# Patient Record
Sex: Female | Born: 1935 | Race: Black or African American | Hispanic: No | Smoking: Former smoker
Health system: Southern US, Community
[De-identification: ages and names within clinical notes are randomized; demographics above are authoritative.]

## PROBLEM LIST (undated history)

## (undated) DIAGNOSIS — Z9289 Personal history of other medical treatment: Secondary | ICD-10-CM

## (undated) DIAGNOSIS — M1990 Unspecified osteoarthritis, unspecified site: Secondary | ICD-10-CM

## (undated) DIAGNOSIS — D649 Anemia, unspecified: Secondary | ICD-10-CM

## (undated) DIAGNOSIS — I10 Essential (primary) hypertension: Secondary | ICD-10-CM

## (undated) HISTORY — PX: CYSTECTOMY: SUR359

## (undated) HISTORY — PX: CATARACT EXTRACTION: SUR2

## (undated) HISTORY — PX: CHOLECYSTECTOMY: SHX55

## (undated) HISTORY — PX: TONSILLECTOMY: SUR1361

## (undated) HISTORY — PX: APPENDECTOMY: SHX54

---

## 2001-11-27 ENCOUNTER — Emergency Department (HOSPITAL_COMMUNITY): Admission: EM | Admit: 2001-11-27 | Discharge: 2001-11-27 | Payer: Self-pay | Admitting: Emergency Medicine

## 2001-11-27 ENCOUNTER — Ambulatory Visit (HOSPITAL_COMMUNITY): Admission: RE | Admit: 2001-11-27 | Discharge: 2001-11-27 | Payer: Self-pay | Admitting: Pulmonary Disease

## 2014-08-05 ENCOUNTER — Inpatient Hospital Stay (HOSPITAL_COMMUNITY)
Admission: EM | Admit: 2014-08-05 | Discharge: 2014-08-15 | DRG: 480 | Disposition: A | Discharge status: Skilled Nursing Facility | Payer: Medicare HMO | Attending: Internal Medicine | Admitting: Internal Medicine

## 2014-08-05 ENCOUNTER — Emergency Department (HOSPITAL_COMMUNITY): Payer: Medicare HMO

## 2014-08-05 ENCOUNTER — Encounter (HOSPITAL_COMMUNITY): Payer: Self-pay | Admitting: Emergency Medicine

## 2014-08-05 ENCOUNTER — Inpatient Hospital Stay (HOSPITAL_COMMUNITY): Payer: Medicare HMO

## 2014-08-05 DIAGNOSIS — IMO0002 Reserved for concepts with insufficient information to code with codable children: Secondary | ICD-10-CM | POA: Diagnosis present

## 2014-08-05 DIAGNOSIS — G9340 Encephalopathy, unspecified: Secondary | ICD-10-CM | POA: Diagnosis present

## 2014-08-05 DIAGNOSIS — M25562 Pain in left knee: Secondary | ICD-10-CM | POA: Diagnosis present

## 2014-08-05 DIAGNOSIS — J189 Pneumonia, unspecified organism: Secondary | ICD-10-CM | POA: Diagnosis present

## 2014-08-05 DIAGNOSIS — E43 Unspecified severe protein-calorie malnutrition: Secondary | ICD-10-CM | POA: Diagnosis present

## 2014-08-05 DIAGNOSIS — S72452S Displaced supracondylar fracture without intracondylar extension of lower end of left femur, sequela: Secondary | ICD-10-CM | POA: Diagnosis not present

## 2014-08-05 DIAGNOSIS — Z681 Body mass index (BMI) 19 or less, adult: Secondary | ICD-10-CM

## 2014-08-05 DIAGNOSIS — S82892K Other fracture of left lower leg, subsequent encounter for closed fracture with nonunion: Secondary | ICD-10-CM | POA: Diagnosis not present

## 2014-08-05 DIAGNOSIS — T148XXA Other injury of unspecified body region, initial encounter: Secondary | ICD-10-CM

## 2014-08-05 DIAGNOSIS — W010XXA Fall on same level from slipping, tripping and stumbling without subsequent striking against object, initial encounter: Secondary | ICD-10-CM | POA: Diagnosis present

## 2014-08-05 DIAGNOSIS — E860 Dehydration: Secondary | ICD-10-CM | POA: Diagnosis not present

## 2014-08-05 DIAGNOSIS — N390 Urinary tract infection, site not specified: Secondary | ICD-10-CM | POA: Diagnosis not present

## 2014-08-05 DIAGNOSIS — B9620 Unspecified Escherichia coli [E. coli] as the cause of diseases classified elsewhere: Secondary | ICD-10-CM | POA: Diagnosis not present

## 2014-08-05 DIAGNOSIS — N179 Acute kidney failure, unspecified: Secondary | ICD-10-CM | POA: Diagnosis not present

## 2014-08-05 DIAGNOSIS — W109XXA Fall (on) (from) unspecified stairs and steps, initial encounter: Secondary | ICD-10-CM | POA: Diagnosis not present

## 2014-08-05 DIAGNOSIS — S72302A Unspecified fracture of shaft of left femur, initial encounter for closed fracture: Secondary | ICD-10-CM | POA: Diagnosis present

## 2014-08-05 DIAGNOSIS — N189 Chronic kidney disease, unspecified: Secondary | ICD-10-CM | POA: Diagnosis not present

## 2014-08-05 DIAGNOSIS — M1990 Unspecified osteoarthritis, unspecified site: Secondary | ICD-10-CM | POA: Diagnosis present

## 2014-08-05 DIAGNOSIS — S72355D Nondisplaced comminuted fracture of shaft of left femur, subsequent encounter for closed fracture with routine healing: Secondary | ICD-10-CM | POA: Diagnosis present

## 2014-08-05 DIAGNOSIS — D509 Iron deficiency anemia, unspecified: Secondary | ICD-10-CM | POA: Diagnosis present

## 2014-08-05 DIAGNOSIS — N3000 Acute cystitis without hematuria: Secondary | ICD-10-CM | POA: Diagnosis not present

## 2014-08-05 DIAGNOSIS — S72402A Unspecified fracture of lower end of left femur, initial encounter for closed fracture: Principal | ICD-10-CM | POA: Diagnosis present

## 2014-08-05 DIAGNOSIS — E872 Acidosis, unspecified: Secondary | ICD-10-CM | POA: Diagnosis present

## 2014-08-05 DIAGNOSIS — J690 Pneumonitis due to inhalation of food and vomit: Secondary | ICD-10-CM | POA: Diagnosis present

## 2014-08-05 DIAGNOSIS — Z87891 Personal history of nicotine dependence: Secondary | ICD-10-CM | POA: Diagnosis not present

## 2014-08-05 DIAGNOSIS — D500 Iron deficiency anemia secondary to blood loss (chronic): Secondary | ICD-10-CM | POA: Diagnosis present

## 2014-08-05 DIAGNOSIS — Y929 Unspecified place or not applicable: Secondary | ICD-10-CM

## 2014-08-05 DIAGNOSIS — S72302S Unspecified fracture of shaft of left femur, sequela: Secondary | ICD-10-CM | POA: Diagnosis not present

## 2014-08-05 DIAGNOSIS — A419 Sepsis, unspecified organism: Secondary | ICD-10-CM | POA: Diagnosis not present

## 2014-08-05 DIAGNOSIS — S82892A Other fracture of left lower leg, initial encounter for closed fracture: Secondary | ICD-10-CM | POA: Diagnosis not present

## 2014-08-05 DIAGNOSIS — Z9049 Acquired absence of other specified parts of digestive tract: Secondary | ICD-10-CM | POA: Diagnosis not present

## 2014-08-05 DIAGNOSIS — Z9842 Cataract extraction status, left eye: Secondary | ICD-10-CM

## 2014-08-05 DIAGNOSIS — Z515 Encounter for palliative care: Secondary | ICD-10-CM

## 2014-08-05 DIAGNOSIS — I129 Hypertensive chronic kidney disease with stage 1 through stage 4 chronic kidney disease, or unspecified chronic kidney disease: Secondary | ICD-10-CM | POA: Diagnosis present

## 2014-08-05 DIAGNOSIS — R630 Anorexia: Secondary | ICD-10-CM | POA: Diagnosis present

## 2014-08-05 DIAGNOSIS — Z0000 Encounter for general adult medical examination without abnormal findings: Secondary | ICD-10-CM

## 2014-08-05 DIAGNOSIS — R509 Fever, unspecified: Secondary | ICD-10-CM

## 2014-08-05 DIAGNOSIS — S7292XA Unspecified fracture of left femur, initial encounter for closed fracture: Secondary | ICD-10-CM

## 2014-08-05 DIAGNOSIS — W19XXXA Unspecified fall, initial encounter: Secondary | ICD-10-CM

## 2014-08-05 DIAGNOSIS — D649 Anemia, unspecified: Secondary | ICD-10-CM

## 2014-08-05 DIAGNOSIS — S72302D Unspecified fracture of shaft of left femur, subsequent encounter for closed fracture with routine healing: Secondary | ICD-10-CM | POA: Diagnosis not present

## 2014-08-05 HISTORY — DX: Essential (primary) hypertension: I10

## 2014-08-05 HISTORY — DX: Anemia, unspecified: D64.9

## 2014-08-05 HISTORY — DX: Unspecified osteoarthritis, unspecified site: M19.90

## 2014-08-05 HISTORY — DX: Personal history of other medical treatment: Z92.89

## 2014-08-05 LAB — PREPARE RBC (CROSSMATCH)

## 2014-08-05 LAB — COMPREHENSIVE METABOLIC PANEL
ALBUMIN: 3.6 g/dL (ref 3.5–5.2)
ALT: 7 U/L (ref 0–35)
AST: 15 U/L (ref 0–37)
Alkaline Phosphatase: 77 U/L (ref 39–117)
Anion gap: 15 (ref 5–15)
BUN: 25 mg/dL — ABNORMAL HIGH (ref 6–23)
CHLORIDE: 107 meq/L (ref 96–112)
CO2: 21 mEq/L (ref 19–32)
CREATININE: 1.66 mg/dL — AB (ref 0.50–1.10)
Calcium: 8.9 mg/dL (ref 8.4–10.5)
GFR calc Af Amer: 31 mL/min — ABNORMAL LOW (ref >90)
GFR calc non Af Amer: 26 mL/min — ABNORMAL LOW (ref >90)
Glucose, Bld: 131 mg/dL — ABNORMAL HIGH (ref 70–99)
POTASSIUM: 4.4 meq/L (ref 3.7–5.3)
Sodium: 143 mEq/L (ref 137–147)
Total Bilirubin: 0.2 mg/dL — ABNORMAL LOW (ref 0.3–1.2)
Total Protein: 6.9 g/dL (ref 6.0–8.3)

## 2014-08-05 LAB — CBC WITH DIFFERENTIAL/PLATELET
BASOS ABS: 0 10*3/uL (ref 0.0–0.1)
BASOS PCT: 0 % (ref 0–1)
Eosinophils Absolute: 0.1 10*3/uL (ref 0.0–0.7)
Eosinophils Relative: 1 % (ref 0–5)
HCT: 17 % — ABNORMAL LOW (ref 36.0–46.0)
Hemoglobin: 5.1 g/dL — CL (ref 12.0–15.0)
Lymphocytes Relative: 11 % — ABNORMAL LOW (ref 12–46)
Lymphs Abs: 0.9 10*3/uL (ref 0.7–4.0)
MCH: 19.8 pg — ABNORMAL LOW (ref 26.0–34.0)
MCHC: 30 g/dL (ref 30.0–36.0)
MCV: 66.1 fL — ABNORMAL LOW (ref 78.0–100.0)
MONO ABS: 0.4 10*3/uL (ref 0.1–1.0)
Monocytes Relative: 4 % (ref 3–12)
NEUTROS ABS: 7.1 10*3/uL (ref 1.7–7.7)
NEUTROS PCT: 84 % — AB (ref 43–77)
Platelets: 486 10*3/uL — ABNORMAL HIGH (ref 150–400)
RBC: 2.57 MIL/uL — ABNORMAL LOW (ref 3.87–5.11)
RDW: 16.9 % — AB (ref 11.5–15.5)
WBC: 8.5 10*3/uL (ref 4.0–10.5)

## 2014-08-05 LAB — IRON AND TIBC
IRON: 15 ug/dL — AB (ref 42–135)
SATURATION RATIOS: 4 % — AB (ref 20–55)
TIBC: 400 ug/dL (ref 250–470)
UIBC: 385 ug/dL (ref 125–400)

## 2014-08-05 LAB — ABO/RH
ABO/RH(D): O POS
ABO/RH(D): O POS

## 2014-08-05 LAB — FOLATE: Folate: 8.8 ng/mL

## 2014-08-05 LAB — RETICULOCYTES
RBC.: 2.56 MIL/uL — ABNORMAL LOW (ref 3.87–5.11)
RETIC CT PCT: 1.7 % (ref 0.4–3.1)
Retic Count, Absolute: 43.5 10*3/uL (ref 19.0–186.0)

## 2014-08-05 LAB — VITAMIN B12: VITAMIN B 12: 285 pg/mL (ref 211–911)

## 2014-08-05 LAB — FERRITIN: Ferritin: 14 ng/mL (ref 10–291)

## 2014-08-05 MED ORDER — HYDROMORPHONE HCL 1 MG/ML IJ SOLN
0.5000 mg | Freq: Once | INTRAMUSCULAR | Status: AC
  Administered 2014-08-05: 0.5 mg via INTRAVENOUS
  Filled 2014-08-05: qty 1

## 2014-08-05 MED ORDER — SODIUM CHLORIDE 0.9 % IJ SOLN
3.0000 mL | Freq: Two times a day (BID) | INTRAMUSCULAR | Status: DC
  Administered 2014-08-05 – 2014-08-15 (×9): 3 mL via INTRAVENOUS

## 2014-08-05 MED ORDER — MORPHINE SULFATE 2 MG/ML IJ SOLN
1.0000 mg | INTRAMUSCULAR | Status: DC | PRN
  Administered 2014-08-06 – 2014-08-14 (×5): 1 mg via INTRAVENOUS
  Filled 2014-08-05 (×6): qty 1

## 2014-08-05 MED ORDER — TETANUS-DIPHTH-ACELL PERTUSSIS 5-2.5-18.5 LF-MCG/0.5 IM SUSP
INTRAMUSCULAR | Status: AC
Start: 2014-08-05 — End: 2014-08-05
  Filled 2014-08-05: qty 0.5

## 2014-08-05 MED ORDER — OXYCODONE HCL 5 MG PO TABS
5.0000 mg | ORAL_TABLET | ORAL | Status: DC | PRN
  Administered 2014-08-05 – 2014-08-06 (×3): 5 mg via ORAL
  Filled 2014-08-05 (×3): qty 1

## 2014-08-05 MED ORDER — SODIUM CHLORIDE 0.9 % IV SOLN
Freq: Once | INTRAVENOUS | Status: AC
  Administered 2014-08-05: 10:00:00 via INTRAVENOUS

## 2014-08-05 MED ORDER — SODIUM CHLORIDE 0.9 % IV SOLN: Freq: Once | INTRAVENOUS | Status: DC

## 2014-08-05 MED ORDER — ACETAMINOPHEN 650 MG RE SUPP: 650.0000 mg | Freq: Four times a day (QID) | RECTAL | Status: DC | PRN

## 2014-08-05 MED ORDER — ONDANSETRON HCL 4 MG/2ML IJ SOLN
4.0000 mg | Freq: Once | INTRAMUSCULAR | Status: AC
  Administered 2014-08-05: 4 mg via INTRAVENOUS
  Filled 2014-08-05: qty 2

## 2014-08-05 MED ORDER — ACETAMINOPHEN 325 MG PO TABS
650.0000 mg | ORAL_TABLET | Freq: Four times a day (QID) | ORAL | Status: DC | PRN
  Administered 2014-08-06 – 2014-08-15 (×11): 650 mg via ORAL
  Filled 2014-08-05 (×11): qty 2

## 2014-08-05 MED ORDER — TETANUS-DIPHTH-ACELL PERTUSSIS 5-2.5-18.5 LF-MCG/0.5 IM SUSP
0.5000 mL | Freq: Once | INTRAMUSCULAR | Status: AC
  Administered 2014-08-05: 0.5 mL via INTRAMUSCULAR

## 2014-08-05 MED ORDER — KCL IN DEXTROSE-NACL 20-5-0.45 MEQ/L-%-% IV SOLN
INTRAVENOUS | Status: DC
  Administered 2014-08-06: 03:00:00 via INTRAVENOUS
  Filled 2014-08-05 (×3): qty 1000

## 2014-08-05 NOTE — Progress Notes (Signed)
Nursing called orthopedic surgeon Dr. Fredonia Highland. Dr. To see pt in 7mins. Pt is npo family member at bedside.

## 2014-08-05 NOTE — H&P (Signed)
[facility] History and Physical  Alicia Guerra [id number] DOB: 08-26-1924 DOA: 08/05/2014  Referring physician: Dr. Roderic Palau PCP: No PCP Per Patient   Chief Complaint: Left knee fracture and low hemoglobin  HPI: Alicia Guerra is a 80 y.o. female  The patient is an 80 year old who currently lives by herself and presented to the ED after a fall. She states she slid down the steps and hit her knee. Subsequently developed pain at her left knee. Had pain with ambulation as such decided to come to the ED for further evaluation. While at the ED patient had x-ray of her left knee which reported comminuted fracture of the distal femoral diaphysis with angulation and displacement at the fracture site. Incidentally she was also found to have a low hemoglobin of 5.1. The patient otherwise has known complaints other than her left knee discomfort.  We were consulted for medical admission and request was made by orthopedic surgeon to transition to [facility] where orthopedic surgery would evaluate him there.   Review of Systems:  Constitutional:  No weight loss, night sweats, Fevers, chills, fatigue.  HEENT:  No headaches, Difficulty swallowing,Tooth/dental problems,Sore throat,  No sneezing, itching, ear ache, nasal congestion, post nasal drip,  Cardio-vascular:  No chest pain, Orthopnea, PND, swelling in lower extremities, anasarca, dizziness, palpitations  GI:  No heartburn, indigestion, abdominal pain, nausea, vomiting, diarrhea, change in bowel habits, loss of appetite  Resp:  No shortness of breath with exertion or at rest. No excess mucus, no productive cough, No non-productive cough, No coughing up of blood.No change in color of mucus.No wheezing.No chest wall deformity  Skin:  no rash or lesions.  GU:  no dysuria, change in color of urine, no urgency or frequency. No flank pain.  Musculoskeletal:  + joint pain or swelling. + decreased range of motion. No back  pain.  Psych:  No change in mood or affect. No depression or anxiety. No memory loss.   Past Medical History  Diagnosis Date  . Hypertension   . Arthritis    Past Surgical History  Procedure Laterality Date  . Back surgery     Social History:  reports that she has never smoked. She has never used smokeless tobacco. She reports that she does not drink alcohol or use illicit drugs.  No Known Allergies  History reviewed. No pertinent family history.   Prior to Admission medications   Not on File   Physical Exam: Filed Vitals:   08/05/14 0930 08/05/14 1000 08/05/14 1030 08/05/14 1100  BP: 180/105 161/54 160/55 162/56  Pulse: 87 78 81   Temp:      TempSrc:      Resp:  17 20 18   Height:      Weight:      SpO2: 98% 95% 100%     Wt Readings from Last 3 Encounters:  08/05/14 54.432 kg (120 lb)    General:  Appears calm and comfortable, Disheveled with poor hygeine Eyes: PERRL, normal lids, irises & conjunctiva ENT: grossly normal hearing, lips & tongue Neck: no LAD, masses or thyromegaly Cardiovascular: RRR, no m/r/g. No LE edema. Respiratory: CTA bilaterally, no w/r/r. Normal respiratory effort. Abdomen: soft, nt, nd Skin: no rash or induration seen on limited exam Musculoskeletal: swollen left knee currently immobilized and painful with movement or palpation Psychiatric: grossly normal mood and affect, speech fluent and appropriate Neurologic: grossly non-focal.          Labs on Admission:  Basic Metabolic Panel:  Recent Labs Lab 08/05/14 0940  NA 143  K 4.4  CL 107  CO2 21  GLUCOSE 131*  BUN 25*  CREATININE 1.66*  CALCIUM 8.9   Liver Function Tests:  Recent Labs Lab 08/05/14 0940  AST 15  ALT 7  ALKPHOS 77  BILITOT 0.2*  PROT 6.9  ALBUMIN 3.6   No results for input(s): LIPASE, AMYLASE in the last 168 hours. No results for input(s): AMMONIA in the last 168 hours. CBC:  Recent Labs Lab 08/05/14 0940  WBC 8.5  NEUTROABS 7.1  HGB 5.1*    HCT 17.0*  MCV 66.1*  PLT 486*   Cardiac Enzymes: No results for input(s): CKTOTAL, CKMB, CKMBINDEX, TROPONINI in the last 168 hours.  BNP (last 3 results) No results for input(s): PROBNP in the last 8760 hours. CBG: No results for input(s): GLUCAP in the last 168 hours.  Radiological Exams on Admission: Dg Knee 1-2 Views Left  08/05/2014   CLINICAL DATA:  Patient fell down steps  EXAM: LEFT KNEE - 1-2 VIEW  COMPARISON:  None.  FINDINGS: Frontal and lateral views were obtained. There is a comminuted fracture of the distal femoral metaphysis with posterior angulation and displacement of the distal major fracture fragment with respect proximal fragment. No other fractures. No frank dislocation. There is mild narrowing of the patellofemoral joint. There is extensive arterial vascular calcification.  IMPRESSION: Comminuted fracture distal femoral metaphysis with angulation and displacement at the fracture site. No dislocation.   Electronically Signed   By: Lowella Grip M.D.   On: 08/05/2014 08:54   Dg Pelvis Portable  08/05/2014   CLINICAL DATA:  Distal right femur fracture secondary to a fall down steps today.  EXAM: PORTABLE PELVIS 1-2 VIEWS  COMPARISON:  None.  FINDINGS: There is no evidence of pelvic fracture or diastasis. No pelvic bone lesions are seen. Calcified fibroid in the uterus. Bowel gas pattern is normal.  IMPRESSION: Normal pelvic bones.  No acute abnormalities.   Electronically Signed   By: Rozetta Nunnery M.D.   On: 08/05/2014 09:57   Dg Chest Portable 1 View  08/05/2014   CLINICAL DATA:  Pre operative respiratory exam. Distal left femur fracture.  EXAM: PORTABLE CHEST - 1 VIEW  COMPARISON:  None.  FINDINGS: Heart size is within normal limits considering the AP portable technique. There is slight pulmonary vascular prominence. Slight thickening of the minor fissure on the right. Minimal atelectasis or scarring at the left lung base. Old healed left eighth rib fracture posterior  laterally.  The L1 vertebra is slightly sclerotic. This may be due to a prior fracture.  IMPRESSION: Slight pulmonary vascular prominence. Minimal scarring or atelectasis at the left base.   Electronically Signed   By: Rozetta Nunnery M.D.   On: 08/05/2014 10:04    EKG: Independently reviewed. Much artifact but on my evaluation looked sinus with no ST elevations or depressions  Assessment/Plan Active Problems:   Knee fracture -  Supporting therapy - Orthopedic surgery on board and will evaluate and treat. Orthopedic surgeon here at this hospital would like patient transition to [facility] where orthopedic surgeons at that hospital may provide continued care for the patient - Nothing by mouth    Blood loss anemia -Most likely GI source given positive Hemoccult. - Agree with 2 units of blood transfusion and will continue ER order for such    Dehydration - Place on maintenance IV fluids and reassess  Code Status: full DVT Prophylaxis: SCD on Right  leg (unaffected leg) Family Communication: Discussed with patient and family at bedside Disposition Plan: Transition to [name] on telemetry  Time spent: More than 45 minutes  Velvet Bathe [facility] Pager [phone]

## 2014-08-05 NOTE — ED Notes (Signed)
Per EMS pt was found at the bottom of her steps this am. Obvious deformity to L knee. Pt was out of doors for over an hour. L leg immobilized by EMS. Pt hypertensive on arrival. Pt reports she stepped on some leaves and fell down the steps.Pt denies hitting her head or LOC. Pt states "I stepped on some leaves and slid down the steps on my butt."

## 2014-08-05 NOTE — ED Notes (Signed)
Pt's hands cold. Pulse Ox not registering well.

## 2014-08-05 NOTE — ED Notes (Signed)
Report given to receiving RN, pt transferred via [place]. Unit 1 of 2 transfusing.

## 2014-08-05 NOTE — ED Provider Notes (Signed)
CSN: [id number]     Arrival date & time 08/05/14  0813 History  This chart was scribed for Maudry Diego, MD by Rayfield Citizen, ED Scribe. This patient was seen in room APA04/APA04 and the patient's care was started at 9:02 AM.    Chief Complaint  Patient presents with  . Fall   Patient is a 80 y.o. female presenting with fall. The history is provided by the patient (pt complains of fall). No language interpreter was used.  Fall This is a new problem. The current episode started 1 to 2 hours ago. The problem has not changed since onset.Pertinent negatives include no chest pain, no abdominal pain and no headaches. Nothing aggravates the symptoms. Nothing relieves the symptoms. She has tried nothing for the symptoms.     HPI Comments: Alicia Guerra is a 80 y.o. female who presents to the Emergency Department complaining of a fall. Patient "stepped on some leaves" and "slid down 4 stairs" on her bottom this morning. She denies head injury or LOC. Per EMS, patient was found at the bottom of the steps - according to patient, she was outside for over an hour when she was found. She reports left knee pain at present.   PCP was Dr. Luan Pulling; has been over a year since last seen. Patient lives alone at home.   Past Medical History  Diagnosis Date  . Hypertension   . Arthritis    Past Surgical History  Procedure Laterality Date  . Back surgery     History reviewed. No pertinent family history. History  Substance Use Topics  . Smoking status: Never Smoker   . Smokeless tobacco: Never Used  . Alcohol Use: No   OB History    Gravida Para Term Preterm AB TAB SAB Ectopic Multiple Living            0     Review of Systems  Constitutional: Negative for appetite change and fatigue.  HENT: Negative for congestion, ear discharge and sinus pressure.   Eyes: Negative for discharge.  Respiratory: Negative for cough.   Cardiovascular: Negative for chest pain.  Gastrointestinal: Negative for  abdominal pain and diarrhea.  Genitourinary: Negative for frequency and hematuria.  Musculoskeletal: Positive for arthralgias (left knee). Negative for back pain.  Skin: Negative for rash.  Neurological: Negative for seizures and headaches.  Psychiatric/Behavioral: Negative for hallucinations.    Allergies  Review of patient's allergies indicates no known allergies.  Home Medications   Prior to Admission medications   Not on File   BP 198/64 mmHg  Pulse 32  Temp(Src) 97.8 F (36.6 C) (Oral)  Resp 18  Ht 5' (1.524 m)  Wt 120 lb (54.432 kg)  BMI 23.44 kg/m2  SpO2 84% Physical Exam  Constitutional: She is oriented to person, place, and time. She appears well-developed.  HENT:  Head: Normocephalic.  Dry mucous membranes, pale conjunctiva  Eyes: Conjunctivae and EOM are normal. No scleral icterus.  Neck: Neck supple. No thyromegaly present.  Cardiovascular: Normal rate and regular rhythm.  Exam reveals no gallop and no friction rub.   No murmur heard. Pulmonary/Chest: No stridor. She has no wheezes. She has no rales. She exhibits no tenderness.  Abdominal: She exhibits no distension. There is no tenderness. There is no rebound.  Genitourinary:  Rectal: normal exam, brown stool, heme positive  Musculoskeletal: Normal range of motion. She exhibits no edema.  Deformed painful left knee  Lymphadenopathy:    She has no cervical adenopathy.  Neurological: She is oriented to person, place, and time. She exhibits normal muscle tone. Coordination normal.  Skin: No rash noted. No erythema.  Psychiatric: She has a normal mood and affect. Her behavior is normal.    ED Course  Procedures   DIAGNOSTIC STUDIES: Oxygen Saturation is 97% on RA, normal by my interpretation.    COORDINATION OF CARE: 9:10 AM Discussed treatment plan with pt at bedside and pt agreed to plan   Labs Review Labs Reviewed - No data to display  Imaging Review Dg Knee 1-2 Views Left  08/05/2014    CLINICAL DATA:  Patient fell down steps  EXAM: LEFT KNEE - 1-2 VIEW  COMPARISON:  None.  FINDINGS: Frontal and lateral views were obtained. There is a comminuted fracture of the distal femoral metaphysis with posterior angulation and displacement of the distal major fracture fragment with respect proximal fragment. No other fractures. No frank dislocation. There is mild narrowing of the patellofemoral joint. There is extensive arterial vascular calcification.  IMPRESSION: Comminuted fracture distal femoral metaphysis with angulation and displacement at the fracture site. No dislocation.   Electronically Signed   By: Lowella Grip M.D.   On: 08/05/2014 08:54     EKG Interpretation   Date/Time:  Tuesday August 05 2014 09:55:11 EST Ventricular Rate:  89 PR Interval:    QRS Duration: 79 QT Interval:  365 QTC Calculation: 444 R Axis:   42 Text Interpretation:  Atrial fibrillation Ventricular premature complex  Probable LVH with secondary repol abnrm Confirmed by Aylen Rambert  MD, Fines Kimberlin  [phone]) on 08/05/2014 10:47:11 AM     CRITICAL CARE Performed by: Aiko Belko L Total critical care time: 40 Critical care time was exclusive of separately billable procedures and treating other patients. Critical care was necessary to treat or prevent imminent or life-threatening deterioration. Critical care was time spent personally by me on the following activities: development of treatment plan with patient and/or surrogate as well as nursing, discussions with consultants, evaluation of patient's response to treatment, examination of patient, obtaining history from patient or surrogate, ordering and performing treatments and interventions, ordering and review of laboratory studies, ordering and review of radiographic studies, pulse oximetry and re-evaluation of patient's condition.   MDM   Final diagnoses:  Fall   [facility] and [facility].   I spoke with dr. Aline Brochure and  he will contact ortho in [place], MD 08/05/14 1145

## 2014-08-05 NOTE — ED Notes (Signed)
Heme positive result per Dr. Roderic Palau.

## 2014-08-05 NOTE — ED Notes (Signed)
Critical value Hgb 5.1 reported to Dr. Roderic Palau.

## 2014-08-05 NOTE — Consult Note (Signed)
ORTHOPAEDIC CONSULTATION  REQUESTING PHYSICIAN: Velvet Bathe, MD  Chief Complaint: Left femur fracture  HPI: Alicia Guerra is a 80 y.o. female who complains of a mechanical fall on wet leaves. No other c/o  Past Medical History  Diagnosis Date  . Hypertension   . Arthritis   . Anemia   . History of blood transfusion     "today is the first time" (08/05/2014)   Past Surgical History  Procedure Laterality Date  . Cataract extraction Left   . Tonsillectomy    . Appendectomy    . Cholecystectomy    . Cystectomy      "had cyst taken off lower back"   History   Social History  . Marital Status: Widowed    Spouse Name: N/A    Number of Children: N/A  . Years of Education: N/A   Social History Main Topics  . Smoking status: Former Research scientist (life sciences)  . Smokeless tobacco: Never Used  . Alcohol Use: No  . Drug Use: No  . Sexual Activity: No   Other Topics Concern  . None   Social History Narrative  . None   History reviewed. No pertinent family history. No Known Allergies Prior to Admission medications   Not on File   Dg Knee 1-2 Views Left  08/05/2014   CLINICAL DATA:  Patient fell down steps  EXAM: LEFT KNEE - 1-2 VIEW  COMPARISON:  None.  FINDINGS: Frontal and lateral views were obtained. There is a comminuted fracture of the distal femoral metaphysis with posterior angulation and displacement of the distal major fracture fragment with respect proximal fragment. No other fractures. No frank dislocation. There is mild narrowing of the patellofemoral joint. There is extensive arterial vascular calcification.  IMPRESSION: Comminuted fracture distal femoral metaphysis with angulation and displacement at the fracture site. No dislocation.   Electronically Signed   By: Lowella Grip M.D.   On: 08/05/2014 08:54   Dg Pelvis Portable  08/05/2014   CLINICAL DATA:  Distal right femur fracture secondary to a fall down steps today.  EXAM: PORTABLE PELVIS 1-2 VIEWS  COMPARISON:   None.  FINDINGS: There is no evidence of pelvic fracture or diastasis. No pelvic bone lesions are seen. Calcified fibroid in the uterus. Bowel gas pattern is normal.  IMPRESSION: Normal pelvic bones.  No acute abnormalities.   Electronically Signed   By: Rozetta Nunnery M.D.   On: 08/05/2014 09:57   Dg Chest Portable 1 View  08/05/2014   CLINICAL DATA:  Pre operative respiratory exam. Distal left femur fracture.  EXAM: PORTABLE CHEST - 1 VIEW  COMPARISON:  None.  FINDINGS: Heart size is within normal limits considering the AP portable technique. There is slight pulmonary vascular prominence. Slight thickening of the minor fissure on the right. Minimal atelectasis or scarring at the left lung base. Old healed left eighth rib fracture posterior laterally.  The L1 vertebra is slightly sclerotic. This may be due to a prior fracture.  IMPRESSION: Slight pulmonary vascular prominence. Minimal scarring or atelectasis at the left base.   Electronically Signed   By: Rozetta Nunnery M.D.   On: 08/05/2014 10:04    Positive ROS: All other systems have been reviewed and were otherwise negative with the exception of those mentioned in the HPI and as above.  Labs cbc  Recent Labs  08/05/14 0940  WBC 8.5  HGB 5.1*  HCT 17.0*  PLT 486*    Labs inflam No results for input(s): CRP  in the last 72 hours.  Invalid input(s): ESR  Labs coag No results for input(s): INR, PTT in the last 72 hours.  Invalid input(s): PT   Recent Labs  08/05/14 0940  NA 143  K 4.4  CL 107  CO2 21  GLUCOSE 131*  BUN 25*  CREATININE 1.66*  CALCIUM 8.9    Physical Exam: Filed Vitals:   08/05/14 1355  BP: 182/61  Pulse: 93  Temp: 98.1 F (36.7 C)  Resp: 18   General: Alert, no acute distress Cardiovascular: No pedal edema Respiratory: No cyanosis, no use of accessory musculature GI: No organomegaly, abdomen is soft and non-tender Skin: No lesions in the area of chief complaint other than those listed below in MSK  exam.  Neurologic: Sensation intact distally Psychiatric: Patient is competent for consent with normal mood and affect Lymphatic: No axillary or cervical lymphadenopathy  MUSCULOSKELETAL:  LLE: skin intact Pain at the left leg SILT DP/SP/S/S/T nerve, 2+ DP, +TA/GS/EHL Compartments soft  Other extremities are atraumatic with painless ROM and NVI.  Assessment: Left Supracondylar/intracondylar femur fracture  Plan: ORIF on Thursday CT to eval condylar involvement Weight Bearing Status: NWB LLE PT VTE px: SCD's and hold chemical px on Thursday am   [name], D, MD Cell [phone]   08/05/2014 4:01 PM

## 2014-08-05 NOTE — ED Notes (Signed)
Report given to [facility], Therapist, sports. Pt ready for transport.

## 2014-08-05 NOTE — Progress Notes (Signed)
Pt bp elevated sbp 170's and pt is incontinent of urine. Paged Dr. Wendee Beavers to request a foley catheter and make him aware of bp. Orders for foley catheter given. Monitor blood pressure. Monitoring will continue.

## 2014-08-06 ENCOUNTER — Inpatient Hospital Stay (HOSPITAL_COMMUNITY): Payer: Medicare HMO

## 2014-08-06 DIAGNOSIS — G9340 Encephalopathy, unspecified: Secondary | ICD-10-CM

## 2014-08-06 DIAGNOSIS — N179 Acute kidney failure, unspecified: Secondary | ICD-10-CM

## 2014-08-06 DIAGNOSIS — S72452S Displaced supracondylar fracture without intracondylar extension of lower end of left femur, sequela: Secondary | ICD-10-CM

## 2014-08-06 DIAGNOSIS — J189 Pneumonia, unspecified organism: Secondary | ICD-10-CM

## 2014-08-06 LAB — TYPE AND SCREEN
ABO/RH(D): O POS
ANTIBODY SCREEN: NEGATIVE
Unit division: 0

## 2014-08-06 LAB — BASIC METABOLIC PANEL
Anion gap: 15 (ref 5–15)
BUN: 24 mg/dL — ABNORMAL HIGH (ref 6–23)
CHLORIDE: 107 meq/L (ref 96–112)
CO2: 18 mEq/L — ABNORMAL LOW (ref 19–32)
CREATININE: 1.73 mg/dL — AB (ref 0.50–1.10)
Calcium: 8.6 mg/dL (ref 8.4–10.5)
GFR calc Af Amer: 29 mL/min — ABNORMAL LOW (ref >90)
GFR calc non Af Amer: 25 mL/min — ABNORMAL LOW (ref >90)
Glucose, Bld: 160 mg/dL — ABNORMAL HIGH (ref 70–99)
Potassium: 4.9 mEq/L (ref 3.7–5.3)
Sodium: 140 mEq/L (ref 137–147)

## 2014-08-06 LAB — URINE MICROSCOPIC-ADD ON

## 2014-08-06 LAB — CBC
HEMATOCRIT: 28.5 % — AB (ref 36.0–46.0)
Hemoglobin: 8.9 g/dL — ABNORMAL LOW (ref 12.0–15.0)
MCH: 22.5 pg — ABNORMAL LOW (ref 26.0–34.0)
MCHC: 31.2 g/dL (ref 30.0–36.0)
MCV: 72 fL — ABNORMAL LOW (ref 78.0–100.0)
Platelets: 412 10*3/uL — ABNORMAL HIGH (ref 150–400)
RBC: 3.96 MIL/uL (ref 3.87–5.11)
RDW: 18.9 % — ABNORMAL HIGH (ref 11.5–15.5)
WBC: 14.4 10*3/uL — AB (ref 4.0–10.5)

## 2014-08-06 LAB — URINALYSIS, ROUTINE W REFLEX MICROSCOPIC
Bilirubin Urine: NEGATIVE
Glucose, UA: NEGATIVE mg/dL
Ketones, ur: NEGATIVE mg/dL
NITRITE: NEGATIVE
PROTEIN: 100 mg/dL — AB
Specific Gravity, Urine: 1.013 (ref 1.005–1.030)
Urobilinogen, UA: 0.2 mg/dL (ref 0.0–1.0)
pH: 6.5 (ref 5.0–8.0)

## 2014-08-06 LAB — SURGICAL PCR SCREEN
MRSA, PCR: NEGATIVE
Staphylococcus aureus: NEGATIVE

## 2014-08-06 MED ORDER — SODIUM CHLORIDE 0.9 % IV SOLN
INTRAVENOUS | Status: DC
  Administered 2014-08-06 – 2014-08-07 (×2): via INTRAVENOUS

## 2014-08-06 MED ORDER — ENSURE COMPLETE PO LIQD
237.0000 mL | Freq: Two times a day (BID) | ORAL | Status: DC
  Administered 2014-08-08 – 2014-08-14 (×9): 237 mL via ORAL

## 2014-08-06 MED ORDER — CEFTRIAXONE SODIUM IN DEXTROSE 20 MG/ML IV SOLN
1.0000 g | INTRAVENOUS | Status: DC
  Administered 2014-08-06 – 2014-08-09 (×4): 1 g via INTRAVENOUS
  Filled 2014-08-06 (×6): qty 50

## 2014-08-06 MED ORDER — DEXTROSE 5 % IV SOLN
500.0000 mg | Freq: Every day | INTRAVENOUS | Status: DC
  Administered 2014-08-06 – 2014-08-08 (×3): 500 mg via INTRAVENOUS
  Filled 2014-08-06 (×3): qty 500

## 2014-08-06 NOTE — Progress Notes (Signed)
Utilization Review Completed.Donne Anon T12/10/2013

## 2014-08-06 NOTE — Progress Notes (Addendum)
Pt transferred from [facility] ER, with 1 unit of order PRBC'S transfusing. 1 unit completed at 1610 vs obtained. Pt tolerate transfusion well. 147 cc cleared from iv pump (from [facility]

## 2014-08-06 NOTE — Progress Notes (Signed)
[facility] PROGRESS NOTE  JIMMA ROZELLE [id number] DOB: [birth year] DOA: 08/05/2014 PCP: [name], MD  Brief narrative 80 year old female being independently prior to admission presented to [facility] ED after sustaining a fall when she slid down the steps and hit her left knee. Extraocular left knee none in the ED showed comminuted fracture of the distal femoral diaphysis with angulation and displacement at thie the fracture site. Patient transferred to [name] for orthopedic evaluation. Patient was also found to have a hemoglobin of 5.1 with guaiac-positive stools.     Assessment/Plan: Left knee and displaced distal femoral metaphysis fracture Seen by orthopedic consult with plan for or tomorrow. Pain control with when necessary medications. Patient not complaining of any pain at this time. Foley placed on admission. -SCDs over right leg. Add bowel regimen.  Microcytic anemia iron panel suggests iron deficiency. Patient guiac positive in the ED. contacted PCP office but seems to be closed until 12/8. Hemoglobin is currently stable. If no further drop she can be evaluated with outpatient GI referral. B12 is normal.  Acute encephalopathy with pneumonia Patient appears to be more confused than usual as per niece at bedside. At baseline sees well oriented and very independent. She was febrile to 101.70F overnight and has leukocytosis this morning. Chest x-ray repeated and shows right lower lobe pneumonia. Will place her on Rocephin and azithromycin. -swallow eval.  Acute versus acute on Chronic kidney disease No old labs in the system. Monitor with IV hydration. Check FENa. Monitor renal fn  Protein Calorie malnutrition Nutrition consult   Code Status: Full code Family Communication: niece at bedside Disposition Plan: needs SNF post op   Consultants:  ortho  Procedures:  For OR in am  Antibiotics:  rocephin and azithromycin  12/2--  HPI/Subjective: Seen and examined noted to be febrile to 101.70F overnight. Seems more confused from baseline  Objective: Filed Vitals:   08/06/14 1019  BP: 188/86  Pulse:   Temp: 99.1 F (37.3 C)  Resp:     Intake/Output Summary (Last 24 hours) at 08/06/14 1345 Last data filed at 08/06/14 1014  Gross per 24 hour  Intake    335 ml  Output    850 ml  Net   -515 ml   Filed Weights   08/05/14 0824  Weight: 54.432 kg (120 lb)    Exam:   General: Really female lying in bed in no acute distress  HEENT: No pallor, hoarse voice, moist oral mucosa  Chest: Diminished breath sounds over right lung base, no rhonchi or wheeze  Cardiovascular: S1 and S2, no murmurs  Abdomen: , Nondistended, nontender, bowel sounds present  Musculoskeletal: warm, left knee brace  CNS: AAOX1-2, nonfocal  Data Reviewed: Basic Metabolic Panel:  Recent Labs Lab 08/05/14 0940 08/06/14 0535  NA 143 140  K 4.4 4.9  CL 107 107  CO2 21 18*  GLUCOSE 131* 160*  BUN 25* 24*  CREATININE 1.66* 1.73*  CALCIUM 8.9 8.6   Liver Function Tests:  Recent Labs Lab 08/05/14 0940  AST 15  ALT 7  ALKPHOS 77  BILITOT 0.2*  PROT 6.9  ALBUMIN 3.6   No results for input(s): LIPASE, AMYLASE in the last 168 hours. No results for input(s): AMMONIA in the last 168 hours. CBC:  Recent Labs Lab 08/05/14 0940 08/06/14 0535  WBC 8.5 14.4*  NEUTROABS 7.1  --   HGB 5.1* 8.9*  HCT 17.0* 28.5*  MCV 66.1* 72.0*  PLT 486* 412*   Cardiac  Enzymes: No results for input(s): CKTOTAL, CKMB, CKMBINDEX, TROPONINI in the last 168 hours. BNP (last 3 results) No results for input(s): PROBNP in the last 8760 hours. CBG: No results for input(s): GLUCAP in the last 168 hours.  No results found for this or any previous visit (from the past 240 hour(s)).   Studies: Dg Knee 1-2 Views Left  2014-08-24   CLINICAL DATA:  Patient fell down steps  EXAM: LEFT KNEE - 1-2 VIEW  COMPARISON:  None.  FINDINGS:  Frontal and lateral views were obtained. There is a comminuted fracture of the distal femoral metaphysis with posterior angulation and displacement of the distal major fracture fragment with respect proximal fragment. No other fractures. No frank dislocation. There is mild narrowing of the patellofemoral joint. There is extensive arterial vascular calcification.  IMPRESSION: Comminuted fracture distal femoral metaphysis with angulation and displacement at the fracture site. No dislocation.   Electronically Signed   By: Lowella Grip M.D.   On: 08-24-2014 08:54   Ct Knee Left Wo Contrast  08/06/2014   CLINICAL DATA:  Status post fall, left femoral fracture  EXAM: CT OF THE LEFT KNEE WITHOUT CONTRAST  TECHNIQUE: Multidetector CT imaging of the LEFT knee was performed according to the standard protocol. Multiplanar CT image reconstructions were also generated.  COMPARISON:  None.  FINDINGS: There is a comminuted and impacted distal femoral metaphysis fracture. The fracture cleft extends to the trochlear groove and intercondylar notch. There is a fracture cleft at the a ACL origin. There is approximately 12.5 mm of diastases at the trochlea. There is apex anterior angulation. There is mild apex medial angulation. There is no other fracture or dislocation. There is no significant joint effusion.  There is no soft tissue mass, hematoma or fluid collection. There is mild edema in the popliteal fossa. There is peripheral vascular atherosclerotic disease.  IMPRESSION: Comminuted, displaced, angulated and impacted distal femoral metaphysis fracture as described above.   Electronically Signed   By: Kathreen Devoid   On: 08/06/2014 07:59   Dg Pelvis Portable  24-Aug-2014   CLINICAL DATA:  Distal right femur fracture secondary to a fall down steps today.  EXAM: PORTABLE PELVIS 1-2 VIEWS  COMPARISON:  None.  FINDINGS: There is no evidence of pelvic fracture or diastasis. No pelvic bone lesions are seen. Calcified fibroid in  the uterus. Bowel gas pattern is normal.  IMPRESSION: Normal pelvic bones.  No acute abnormalities.   Electronically Signed   By: Rozetta Nunnery M.D.   On: 08-24-2014 09:57   Dg Chest Port 1 View  08/06/2014   CLINICAL DATA:  Fever.  Subsequent encounter.  EXAM: PORTABLE CHEST - 1 VIEW  COMPARISON:  08-24-14.  FINDINGS: Development of RIGHT perihilar airspace disease. This appears to involve all lobes of the RIGHT lung. In a patient with fever, this is suspicious for multifocal pneumonia. Aspiration pneumonitis or asymmetric pulmonary edema are less likely.  The cardiopericardial silhouette is within normal limits. The LEFT lung appears unchanged, without airspace disease. No effusion. Monitoring leads project over the chest.  Aortic arch atherosclerosis. Asymmetric pleural apical thickening is present, greater on the LEFT than RIGHT.  IMPRESSION: Development of RIGHT perihilar airspace disease favored to represent pneumonia.   Electronically Signed   By: Dereck Ligas M.D.   On: 08/06/2014 10:29   Dg Chest Portable 1 View  August 24, 2014   CLINICAL DATA:  Pre operative respiratory exam. Distal left femur fracture.  EXAM: PORTABLE CHEST - 1 VIEW  COMPARISON:  None.  FINDINGS: Heart size is within normal limits considering the AP portable technique. There is slight pulmonary vascular prominence. Slight thickening of the minor fissure on the right. Minimal atelectasis or scarring at the left lung base. Old healed left eighth rib fracture posterior laterally.  The L1 vertebra is slightly sclerotic. This may be due to a prior fracture.  IMPRESSION: Slight pulmonary vascular prominence. Minimal scarring or atelectasis at the left base.   Electronically Signed   By: Rozetta Nunnery M.D.   On: 08/05/2014 10:04    Scheduled Meds: . sodium chloride   Intravenous Once  . sodium chloride  3 mL Intravenous Q12H   Continuous Infusions: . dextrose 5 % and 0.45 % NaCl with KCl 20 mEq/L 75 mL/hr at 08/06/14 0245       Time spent: 25 minutes    Isaiha Asare, [place]  [facility] Pager [phone]. If 7PM-7AM, please contact night-coverage at [email or web address], password [facility] 08/06/2014, 1:45 PM  LOS: 1 day

## 2014-08-06 NOTE — Evaluation (Signed)
Clinical/Bedside Swallow Evaluation Patient Details  Name: Alicia Guerra MRN: [id number] Date of Birth: [birth year]  Today's Date: 08/06/2014 Time: [id number] SLP Time Calculation (min) (ACUTE ONLY): 23 min  Past Medical History:  Past Medical History  Diagnosis Date  . Hypertension   . Arthritis   . Anemia   . History of blood transfusion     "today is the first time" (08/05/2014)   Past Surgical History:  Past Surgical History  Procedure Laterality Date  . Cataract extraction Left   . Tonsillectomy    . Appendectomy    . Cholecystectomy    . Cystectomy      "had cyst taken off lower back"   HPI:  80 year old female living independently prior to admission presented to [facility] ED after sustaining a fall when she slid down the steps and hit her left knee. Extraocular left knee in the ED showed comminuted fracture of the distal femoral diaphysis with angulation and displacement at thie the fracture site. Patient transferred to [name] for orthopedic evaluation. Patient was also found to have a hemoglobin of 5.1 with guaiac-positive stools. CXR is concerning for new RLL PNA, therefore SLP swallow evaluation was ordered.   Assessment / Plan / Recommendation Clinical Impression  Pt requires repositioning throughout PO intake in order to maintain upright position for safer swallowing. Immediate coughing was noted with initial sip of thin liquid, however with no further overt signs of aspiration observed. With solids, patient has prolonged mastication and requires Mod cues from SLP for oral clearance before taking another bite. Recommend Dys 3 diet and thin liquids with full supervision for positioning and pacing. Given the above and concern for RLL PNA, will continue to follow for tolerance versus need for objective testing.     Aspiration Risk  Moderate    Diet Recommendation Dysphagia 3 (Mechanical Soft);Thin liquid   Liquid Administration via: Cup;Straw Medication  Administration: Whole meds with puree Supervision: Patient able to self feed;Full supervision/cueing for compensatory strategies Compensations: Slow rate;Small sips/bites Postural Changes and/or Swallow Maneuvers: Seated upright 90 degrees    Other  Recommendations Oral Care Recommendations: Oral care BID   Follow Up Recommendations   (tbd)    Frequency and Duration min 2x/week  1 week   Pertinent Vitals/Pain n/a    SLP Swallow Goals     Swallow Study Prior Functional Status       General HPI: 80 year old female living independently prior to admission presented to [facility] ED after sustaining a fall when she slid down the steps and hit her left knee. Extraocular left knee in the ED showed comminuted fracture of the distal femoral diaphysis with angulation and displacement at thie the fracture site. Patient transferred to [name] for orthopedic evaluation. Patient was also found to have a hemoglobin of 5.1 with guaiac-positive stools. CXR is concerning for new RLL PNA, therefore SLP swallow evaluation was ordered. Type of Study: Bedside swallow evaluation Previous Swallow Assessment: none in chart Diet Prior to this Study: Regular;Thin liquids Temperature Spikes Noted: Yes Respiratory Status: Nasal cannula History of Recent Intubation: No Behavior/Cognition: Alert;Cooperative;Pleasant mood;Requires cueing Oral Cavity - Dentition: Edentulous Self-Feeding Abilities: Needs assist Patient Positioning: Upright in bed (pt requires repositioning throughout PO trials) Baseline Vocal Quality: Hoarse (niece reports hoarseness for ~3 years)    Oral/Motor/Sensory Function Overall Oral Motor/Sensory Function: Appears within functional limits for tasks assessed   Ice Chips Ice chips: Not tested   Thin Liquid Thin Liquid: Impaired Presentation: Cup;Self Fed;Straw  Pharyngeal  Phase Impairments: Suspected delayed Swallow;Cough - Immediate    Nectar Thick Nectar Thick Liquid: Not  tested   Honey Thick Honey Thick Liquid: Not tested   Puree Puree: Not tested   Solid   GO    Solid: Impaired Oral Phase Impairments: Impaired mastication;Poor awareness of bolus Oral Phase Functional Implications: Oral holding      Germain Osgood, M.A. CCC-SLP [phone]  Germain Osgood 08/06/2014,3:05 PM

## 2014-08-06 NOTE — Progress Notes (Signed)
Pt 15 min transfusion temp 101.3. Provider notified and orders given to administer prn dose of acetaminophen and continue with transfusion. Will continue to monitor pt temp.

## 2014-08-06 NOTE — Progress Notes (Signed)
INITIAL NUTRITION ASSESSMENT  DOCUMENTATION CODES Per approved criteria  -Severe malnutrition in the context of chronic illness   INTERVENTION:  Ensure Complete po BID, each supplement provides 350 kcal and 13 grams of protein RD to follow for nutrition care plan  NUTRITION DIAGNOSIS: Increased nutrient needs related to malnutrition, repletion as evidenced by estimated nutrition needs  Goal: Pt to meet >/= 90% of their estimated nutrition needs   Monitor:  PO & supplemental intake, weight, labs, I/O's  Reason for Assessment: Consult  80 y.o. female  Admitting Dx: left knee fracture and low hemoglobin  ASSESSMENT: 80 year old Female presented to [facility] to ED after sustaining a fall when she slid down the steps and hit her left knee. Extraocular left knee in the ED showed comminuted fracture of the distal femoral diaphysis with angulation and displacement at the fracture site.   Patient transferred to [name] for orthopedic evaluation. Patient was also found to have a hemoglobin of 5.1 with guaiac-positive stools. CXR concerning for new RLL PNA.  Pt s/p bedside swallow evaluation today.  Limited nutrition hx obtained from patient.  Family member sleeping on chair upon visit.  Pt reports she's eating "ok".  Lunch tray is untouched on tray table.  Pt also states she's lost weight, however, unable to quantify amount or time frame.  Had Ensure Complete supplement on tray table, however, order currently not in place.  RD to order.  Nutrition Focused Physical Exam:  Subcutaneous Fat:  Orbital Region: N/A Upper Arm Region: severe depletion Thoracic and Lumbar Region: N/A  Muscle:  Temple Region: moderate depletion Clavicle Bone Region: severe depletion Clavicle and Acromion Bone Region: severe depletion Scapular Bone Region: N/A Dorsal Hand: N/A Patellar Region: severe depletion Anterior Thigh Region: severe depletion Posterior Calf Region: severe depletion  Edema:  none  Patient meets criteria for severe malnutrition in the context of chronic illness as evidenced by severe muscle loss and severe subcutaneous fat loss.  Height: Ht Readings from Last 1 Encounters:  08/05/14 5' (1.524 m)    Weight: Wt Readings from Last 1 Encounters:  08/05/14 120 lb (54.432 kg)    Ideal Body Weight: 100 lb  % Ideal Body Weight: 120 lb  Wt Readings from Last 10 Encounters:  08/05/14 120 lb (54.432 kg)    Usual Body Weight: ---  % Usual Body Weight: ---  BMI:  Body mass index is 23.44 kg/(m^2).  Estimated Nutritional Needs: Kcal: 1400-1600 Protein: 60-70 gm Fluid: >/= 1.5 L  Skin: Intact  Diet Order: Dys 3, thin liquids  EDUCATION NEEDS: -No education needs identified at this time   Intake/Output Summary (Last 24 hours) at 08/06/14 1532 Last data filed at 08/06/14 1014  Gross per 24 hour  Intake    335 ml  Output    850 ml  Net   -515 ml    Labs:   Recent Labs Lab 08/05/14 0940 08/06/14 0535  NA 143 140  K 4.4 4.9  CL 107 107  CO2 21 18*  BUN 25* 24*  CREATININE 1.66* 1.73*  CALCIUM 8.9 8.6  GLUCOSE 131* 160*    Scheduled Meds: . sodium chloride   Intravenous Once  . azithromycin  500 mg Intravenous Daily  . cefTRIAXone (ROCEPHIN)  IV  1 g Intravenous Q24H  . sodium chloride  3 mL Intravenous Q12H    Continuous Infusions: . sodium chloride      Past Medical History  Diagnosis Date  . Hypertension   . Arthritis   .  Anemia   . History of blood transfusion     "today is the first time" (08/05/2014)    Past Surgical History  Procedure Laterality Date  . Cataract extraction Left   . Tonsillectomy    . Appendectomy    . Cholecystectomy    . Cystectomy      "had cyst taken off lower back"    Arthur Holms, [place], LDN Pager #: [phone] After-Hours Pager #: [phone]

## 2014-08-07 ENCOUNTER — Inpatient Hospital Stay: Admit: 2014-08-07 | Payer: Medicare HMO | Admitting: Orthopedic Surgery

## 2014-08-07 ENCOUNTER — Inpatient Hospital Stay (HOSPITAL_COMMUNITY): Payer: Medicare HMO | Admitting: Anesthesiology

## 2014-08-07 ENCOUNTER — Inpatient Hospital Stay (HOSPITAL_COMMUNITY): Payer: Medicare HMO

## 2014-08-07 ENCOUNTER — Encounter (HOSPITAL_COMMUNITY): Payer: Self-pay | Admitting: Certified Registered Nurse Anesthetist

## 2014-08-07 ENCOUNTER — Encounter (HOSPITAL_COMMUNITY)
Admission: EM | Disposition: A | Discharge status: Skilled Nursing Facility | Payer: Self-pay | Source: Home / Self Care | Attending: Internal Medicine

## 2014-08-07 DIAGNOSIS — S82892K Other fracture of left lower leg, subsequent encounter for closed fracture with nonunion: Secondary | ICD-10-CM

## 2014-08-07 DIAGNOSIS — N3000 Acute cystitis without hematuria: Secondary | ICD-10-CM

## 2014-08-07 DIAGNOSIS — A419 Sepsis, unspecified organism: Secondary | ICD-10-CM

## 2014-08-07 DIAGNOSIS — E43 Unspecified severe protein-calorie malnutrition: Secondary | ICD-10-CM | POA: Insufficient documentation

## 2014-08-07 HISTORY — PX: ORIF FEMUR FRACTURE: SHX2119

## 2014-08-07 LAB — CBC
HCT: 30 % — ABNORMAL LOW (ref 36.0–46.0)
Hemoglobin: 9.5 g/dL — ABNORMAL LOW (ref 12.0–15.0)
MCH: 22.9 pg — ABNORMAL LOW (ref 26.0–34.0)
MCHC: 31.7 g/dL (ref 30.0–36.0)
MCV: 72.5 fL — ABNORMAL LOW (ref 78.0–100.0)
Platelets: 422 10*3/uL — ABNORMAL HIGH (ref 150–400)
RBC: 4.14 MIL/uL (ref 3.87–5.11)
RDW: 19.7 % — ABNORMAL HIGH (ref 11.5–15.5)
WBC: 18.2 10*3/uL — ABNORMAL HIGH (ref 4.0–10.5)

## 2014-08-07 LAB — BASIC METABOLIC PANEL
Anion gap: 17 — ABNORMAL HIGH (ref 5–15)
BUN: 24 mg/dL — ABNORMAL HIGH (ref 6–23)
CALCIUM: 9.1 mg/dL (ref 8.4–10.5)
CO2: 17 meq/L — AB (ref 19–32)
Chloride: 101 mEq/L (ref 96–112)
Creatinine, Ser: 1.77 mg/dL — ABNORMAL HIGH (ref 0.50–1.10)
GFR calc Af Amer: 28 mL/min — ABNORMAL LOW (ref >90)
GFR calc non Af Amer: 24 mL/min — ABNORMAL LOW (ref >90)
GLUCOSE: 160 mg/dL — AB (ref 70–99)
Potassium: 5.3 mEq/L (ref 3.7–5.3)
SODIUM: 135 meq/L — AB (ref 137–147)

## 2014-08-07 LAB — SODIUM, URINE, RANDOM: Sodium, Ur: 93 mEq/L

## 2014-08-07 LAB — CREATININE, URINE, RANDOM: Creatinine, Urine: 124.1 mg/dL

## 2014-08-07 SURGERY — OPEN REDUCTION INTERNAL FIXATION (ORIF) DISTAL FEMUR FRACTURE
Anesthesia: General | Site: Leg Upper | Laterality: Left

## 2014-08-07 MED ORDER — SUCCINYLCHOLINE CHLORIDE 20 MG/ML IJ SOLN
INTRAMUSCULAR | Status: AC
  Filled 2014-08-07: qty 1

## 2014-08-07 MED ORDER — DEXAMETHASONE SODIUM PHOSPHATE 4 MG/ML IJ SOLN
INTRAMUSCULAR | Status: AC
  Filled 2014-08-07: qty 1

## 2014-08-07 MED ORDER — FENTANYL CITRATE 0.05 MG/ML IJ SOLN: 25.0000 ug | INTRAMUSCULAR | Status: DC | PRN

## 2014-08-07 MED ORDER — GLYCOPYRROLATE 0.2 MG/ML IJ SOLN
INTRAMUSCULAR | Status: DC | PRN
  Administered 2014-08-07: .4 mg via INTRAVENOUS

## 2014-08-07 MED ORDER — LIDOCAINE HCL (CARDIAC) 20 MG/ML IV SOLN
INTRAVENOUS | Status: DC | PRN
  Administered 2014-08-07: 50 mg via INTRAVENOUS

## 2014-08-07 MED ORDER — LIDOCAINE HCL (CARDIAC) 20 MG/ML IV SOLN
INTRAVENOUS | Status: AC
  Filled 2014-08-07: qty 5

## 2014-08-07 MED ORDER — CEFAZOLIN SODIUM-DEXTROSE 2-3 GM-% IV SOLR: 2.0000 g | Freq: Four times a day (QID) | INTRAVENOUS | Status: DC

## 2014-08-07 MED ORDER — FENTANYL CITRATE 0.05 MG/ML IJ SOLN
INTRAMUSCULAR | Status: DC | PRN
Start: 2014-08-07 — End: 2014-08-07
  Administered 2014-08-07: 100 ug via INTRAVENOUS

## 2014-08-07 MED ORDER — PHENOL 1.4 % MT LIQD
1.0000 | OROMUCOSAL | Status: DC | PRN
  Filled 2014-08-07: qty 177

## 2014-08-07 MED ORDER — FENTANYL CITRATE 0.05 MG/ML IJ SOLN
INTRAMUSCULAR | Status: AC
  Filled 2014-08-07: qty 5

## 2014-08-07 MED ORDER — ONDANSETRON HCL 4 MG/2ML IJ SOLN
INTRAMUSCULAR | Status: AC
  Filled 2014-08-07: qty 2

## 2014-08-07 MED ORDER — PROPOFOL 10 MG/ML IV BOLUS
INTRAVENOUS | Status: AC
  Filled 2014-08-07: qty 20

## 2014-08-07 MED ORDER — 0.9 % SODIUM CHLORIDE (POUR BTL) OPTIME
TOPICAL | Status: DC | PRN
  Administered 2014-08-07: 1000 mL

## 2014-08-07 MED ORDER — PROPOFOL 10 MG/ML IV BOLUS
INTRAVENOUS | Status: DC | PRN
  Administered 2014-08-07: 70 mg via INTRAVENOUS

## 2014-08-07 MED ORDER — NEOSTIGMINE METHYLSULFATE 10 MG/10ML IV SOLN
INTRAVENOUS | Status: DC | PRN
  Administered 2014-08-07: 3 mg via INTRAVENOUS

## 2014-08-07 MED ORDER — ONDANSETRON HCL 4 MG/2ML IJ SOLN
INTRAMUSCULAR | Status: DC | PRN
  Administered 2014-08-07: 4 mg via INTRAVENOUS

## 2014-08-07 MED ORDER — ROCURONIUM BROMIDE 100 MG/10ML IV SOLN
INTRAVENOUS | Status: DC | PRN
  Administered 2014-08-07: 20 mg via INTRAVENOUS

## 2014-08-07 MED ORDER — ARTIFICIAL TEARS OP OINT
TOPICAL_OINTMENT | OPHTHALMIC | Status: AC
Start: 2014-08-07 — End: 2014-08-07
  Filled 2014-08-07: qty 3.5

## 2014-08-07 MED ORDER — HYDROCODONE-ACETAMINOPHEN 5-325 MG PO TABS
1.0000 | ORAL_TABLET | Freq: Four times a day (QID) | ORAL | Status: DC | PRN
  Administered 2014-08-08 – 2014-08-11 (×3): 1 via ORAL
  Filled 2014-08-07 (×3): qty 1

## 2014-08-07 MED ORDER — PHENYLEPHRINE HCL 10 MG/ML IJ SOLN
INTRAMUSCULAR | Status: DC | PRN
  Administered 2014-08-07 (×3): 80 ug via INTRAVENOUS

## 2014-08-07 MED ORDER — LACTATED RINGERS IV SOLN
INTRAVENOUS | Status: DC | PRN
  Administered 2014-08-07: 15:00:00 via INTRAVENOUS

## 2014-08-07 MED ORDER — ATENOLOL 25 MG PO TABS
25.0000 mg | ORAL_TABLET | Freq: Every day | ORAL | Status: DC
  Administered 2014-08-07 – 2014-08-08 (×2): 25 mg via ORAL
  Filled 2014-08-07 (×3): qty 1

## 2014-08-07 MED ORDER — ROCURONIUM BROMIDE 50 MG/5ML IV SOLN
INTRAVENOUS | Status: AC
  Filled 2014-08-07: qty 1

## 2014-08-07 MED ORDER — ASPIRIN EC 325 MG PO TBEC
325.0000 mg | DELAYED_RELEASE_TABLET | Freq: Every day | ORAL | Status: DC
  Administered 2014-08-08 – 2014-08-15 (×8): 325 mg via ORAL
  Filled 2014-08-07 (×11): qty 1

## 2014-08-07 MED ORDER — ONDANSETRON HCL 4 MG/2ML IJ SOLN: 4.0000 mg | Freq: Once | INTRAMUSCULAR | Status: DC | PRN

## 2014-08-07 MED ORDER — MENTHOL 3 MG MT LOZG
1.0000 | LOZENGE | OROMUCOSAL | Status: DC | PRN
  Filled 2014-08-07: qty 9

## 2014-08-07 MED ORDER — CEFAZOLIN SODIUM-DEXTROSE 2-3 GM-% IV SOLR
2.0000 g | Freq: Once | INTRAVENOUS | Status: AC
  Administered 2014-08-07: 2 g via INTRAVENOUS
  Filled 2014-08-07: qty 50

## 2014-08-07 SURGICAL SUPPLY — 68 items
BANDAGE ELASTIC 4 VELCRO ST LF (GAUZE/BANDAGES/DRESSINGS) ×3 IMPLANT
BANDAGE ELASTIC 6 VELCRO ST LF (GAUZE/BANDAGES/DRESSINGS) ×3 IMPLANT
BIT DRILL 4.3X289 (BIT) ×1 IMPLANT
BIT DRILL AO MATTA 3.2MX230M (BIT) ×1 IMPLANT
BIT DRILL TWST MATTA 4.5MX6.5M (BIT) ×1 IMPLANT
BLADE SURG ROTATE 9660 (MISCELLANEOUS) IMPLANT
BNDG GAUZE ELAST 4 BULKY (GAUZE/BANDAGES/DRESSINGS) ×3 IMPLANT
CLOSURE STERI-STRIP 1/2X4 (GAUZE/BANDAGES/DRESSINGS) ×1
CLSR STERI-STRIP ANTIMIC 1/2X4 (GAUZE/BANDAGES/DRESSINGS) ×2 IMPLANT
CUFF TOURNIQUET SINGLE 24IN (TOURNIQUET CUFF) ×3 IMPLANT
DRAPE C-ARM 42X72 X-RAY (DRAPES) ×3 IMPLANT
DRAPE C-ARMOR (DRAPES) ×3 IMPLANT
DRAPE IMP U-DRAPE 54X76 (DRAPES) ×3 IMPLANT
DRAPE ORTHO SPLIT 77X108 STRL (DRAPES) ×4
DRAPE SURG ORHT 6 SPLT 77X108 (DRAPES) ×2 IMPLANT
DRAPE U-SHAPE 47X51 STRL (DRAPES) ×3 IMPLANT
DRILL BIT 4.3X289 (BIT) ×3
DRILL BIT AO MATTA 3.2MX230M (BIT) ×3
DRILL TWIST MATTA 4.5MX6.5M (BIT) ×3
DRSG ADAPTIC 3X8 NADH LF (GAUZE/BANDAGES/DRESSINGS) ×3 IMPLANT
DRSG PAD ABDOMINAL 8X10 ST (GAUZE/BANDAGES/DRESSINGS) ×3 IMPLANT
ELECT REM PT RETURN 9FT ADLT (ELECTROSURGICAL) ×3
ELECTRODE REM PT RTRN 9FT ADLT (ELECTROSURGICAL) ×1 IMPLANT
GAUZE SPONGE 4X4 12PLY STRL (GAUZE/BANDAGES/DRESSINGS) ×3 IMPLANT
GLOVE BIO SURGEON STRL SZ8 (GLOVE) ×3 IMPLANT
GLOVE BIOGEL PI IND STRL 7.5 (GLOVE) ×1 IMPLANT
GLOVE BIOGEL PI INDICATOR 7.5 (GLOVE) ×2
GLOVE BIOGEL PI ORTHO PRO SZ8 (GLOVE) ×2
GLOVE PI ORTHO PRO STRL SZ8 (GLOVE) ×1 IMPLANT
GLOVE SURG ORTHO 8.0 STRL STRW (GLOVE) ×3 IMPLANT
GOWN STRL REUS W/ TWL LRG LVL3 (GOWN DISPOSABLE) ×2 IMPLANT
GOWN STRL REUS W/ TWL XL LVL3 (GOWN DISPOSABLE) ×1 IMPLANT
GOWN STRL REUS W/TWL LRG LVL3 (GOWN DISPOSABLE) ×4
GOWN STRL REUS W/TWL XL LVL3 (GOWN DISPOSABLE) ×2
KIT BASIN OR (CUSTOM PROCEDURE TRAY) ×3 IMPLANT
KIT ROOM TURNOVER OR (KITS) ×3 IMPLANT
MANIFOLD NEPTUNE II (INSTRUMENTS) IMPLANT
NS IRRIG 1000ML POUR BTL (IV SOLUTION) ×3 IMPLANT
PACK TOTAL JOINT (CUSTOM PROCEDURE TRAY) ×3 IMPLANT
PACK UNIVERSAL I (CUSTOM PROCEDURE TRAY) ×3 IMPLANT
PAD ARMBOARD 7.5X6 YLW CONV (MISCELLANEOUS) ×6 IMPLANT
PAD CAST 4YDX4 CTTN HI CHSV (CAST SUPPLIES) ×1 IMPLANT
PADDING CAST COTTON 4X4 STRL (CAST SUPPLIES) ×2
PADDING CAST COTTON 6X4 STRL (CAST SUPPLIES) ×3 IMPLANT
PLATE FEMUR DISTAL 4H LEFT (Plate) ×3 IMPLANT
SCREW CANCEL MATTA 6.5X70MM (Screw) ×3 IMPLANT
SCREW CORTEX ST MATTA 4.5X34MM (Screw) ×3 IMPLANT
SCREW CORTEX ST MATTA 4.5X38MM (Screw) ×3 IMPLANT
SCREW CORTEX ST MATTA 4.5X70MM (Screw) ×3 IMPLANT
SCREW LOCK T20 60X5XSTNS (Screw) ×2 IMPLANT
SCREW LOCK T20 70X5XSTNS (Screw) ×1 IMPLANT
SCREW LOCKING 30X5.0MM (Screw) ×3 IMPLANT
SCREW LOCKING 48X5.0MM (Screw) ×3 IMPLANT
SCREW LOCKING 5.0X34MM (Screw) ×3 IMPLANT
SCREW LOCKING 5.0X55MM (Screw) ×3 IMPLANT
SCREW LOCKING 5.0X60MM (Screw) ×4 IMPLANT
SCREW LOCKING 5.0X70MM (Screw) ×2 IMPLANT
STAPLER VISISTAT 35W (STAPLE) IMPLANT
SUT MNCRL AB 4-0 PS2 18 (SUTURE) ×3 IMPLANT
SUT MON AB 2-0 CT1 27 (SUTURE) ×3 IMPLANT
SUT MON AB 2-0 CT1 36 (SUTURE) ×3 IMPLANT
SUT VIC AB 0 CT1 27 (SUTURE) ×4
SUT VIC AB 0 CT1 27XBRD ANBCTR (SUTURE) ×2 IMPLANT
TOWEL OR 17X24 6PK STRL BLUE (TOWEL DISPOSABLE) ×3 IMPLANT
TOWEL OR 17X26 10 PK STRL BLUE (TOWEL DISPOSABLE) ×6 IMPLANT
TOWEL OR NON WOVEN STRL DISP B (DISPOSABLE) IMPLANT
TRAY FOLEY CATH 16FRSI W/METER (SET/KITS/TRAYS/PACK) IMPLANT
WATER STERILE IRR 1000ML POUR (IV SOLUTION) IMPLANT

## 2014-08-07 NOTE — H&P (View-Only) (Signed)
ORTHOPAEDIC CONSULTATION  REQUESTING PHYSICIAN: Velvet Bathe, MD  Chief Complaint: Left femur fracture  HPI: Alicia Guerra is a 80 y.o. female who complains of a mechanical fall on wet leaves. No other c/o  Past Medical History  Diagnosis Date  . Hypertension   . Arthritis   . Anemia   . History of blood transfusion     "today is the first time" (08/05/2014)   Past Surgical History  Procedure Laterality Date  . Cataract extraction Left   . Tonsillectomy    . Appendectomy    . Cholecystectomy    . Cystectomy      "had cyst taken off lower back"   History   Social History  . Marital Status: Widowed    Spouse Name: N/A    Number of Children: N/A  . Years of Education: N/A   Social History Main Topics  . Smoking status: Former Research scientist (life sciences)  . Smokeless tobacco: Never Used  . Alcohol Use: No  . Drug Use: No  . Sexual Activity: No   Other Topics Concern  . None   Social History Narrative  . None   History reviewed. No pertinent family history. No Known Allergies Prior to Admission medications   Not on File   Dg Knee 1-2 Views Left  08/05/2014   CLINICAL DATA:  Patient fell down steps  EXAM: LEFT KNEE - 1-2 VIEW  COMPARISON:  None.  FINDINGS: Frontal and lateral views were obtained. There is a comminuted fracture of the distal femoral metaphysis with posterior angulation and displacement of the distal major fracture fragment with respect proximal fragment. No other fractures. No frank dislocation. There is mild narrowing of the patellofemoral joint. There is extensive arterial vascular calcification.  IMPRESSION: Comminuted fracture distal femoral metaphysis with angulation and displacement at the fracture site. No dislocation.   Electronically Signed   By: Lowella Grip M.D.   On: 08/05/2014 08:54   Dg Pelvis Portable  08/05/2014   CLINICAL DATA:  Distal right femur fracture secondary to a fall down steps today.  EXAM: PORTABLE PELVIS 1-2 VIEWS  COMPARISON:   None.  FINDINGS: There is no evidence of pelvic fracture or diastasis. No pelvic bone lesions are seen. Calcified fibroid in the uterus. Bowel gas pattern is normal.  IMPRESSION: Normal pelvic bones.  No acute abnormalities.   Electronically Signed   By: Rozetta Nunnery M.D.   On: 08/05/2014 09:57   Dg Chest Portable 1 View  08/05/2014   CLINICAL DATA:  Pre operative respiratory exam. Distal left femur fracture.  EXAM: PORTABLE CHEST - 1 VIEW  COMPARISON:  None.  FINDINGS: Heart size is within normal limits considering the AP portable technique. There is slight pulmonary vascular prominence. Slight thickening of the minor fissure on the right. Minimal atelectasis or scarring at the left lung base. Old healed left eighth rib fracture posterior laterally.  The L1 vertebra is slightly sclerotic. This may be due to a prior fracture.  IMPRESSION: Slight pulmonary vascular prominence. Minimal scarring or atelectasis at the left base.   Electronically Signed   By: Rozetta Nunnery M.D.   On: 08/05/2014 10:04    Positive ROS: All other systems have been reviewed and were otherwise negative with the exception of those mentioned in the HPI and as above.  Labs cbc  Recent Labs  08/05/14 0940  WBC 8.5  HGB 5.1*  HCT 17.0*  PLT 486*    Labs inflam No results for input(s): CRP  in the last 72 hours.  Invalid input(s): ESR  Labs coag No results for input(s): INR, PTT in the last 72 hours.  Invalid input(s): PT   Recent Labs  08/05/14 0940  NA 143  K 4.4  CL 107  CO2 21  GLUCOSE 131*  BUN 25*  CREATININE 1.66*  CALCIUM 8.9    Physical Exam: Filed Vitals:   08/05/14 1355  BP: 182/61  Pulse: 93  Temp: 98.1 F (36.7 C)  Resp: 18   General: Alert, no acute distress Cardiovascular: No pedal edema Respiratory: No cyanosis, no use of accessory musculature GI: No organomegaly, abdomen is soft and non-tender Skin: No lesions in the area of chief complaint other than those listed below in MSK  exam.  Neurologic: Sensation intact distally Psychiatric: Patient is competent for consent with normal mood and affect Lymphatic: No axillary or cervical lymphadenopathy  MUSCULOSKELETAL:  LLE: skin intact Pain at the left leg SILT DP/SP/S/S/T nerve, 2+ DP, +TA/GS/EHL Compartments soft  Other extremities are atraumatic with painless ROM and NVI.  Assessment: Left Supracondylar/intracondylar femur fracture  Plan: ORIF on Thursday CT to eval condylar involvement Weight Bearing Status: NWB LLE PT VTE px: SCD's and hold chemical px on Thursday am   [name], D, MD Cell [phone]   08/05/2014 4:01 PM

## 2014-08-07 NOTE — Anesthesia Preprocedure Evaluation (Signed)
Anesthesia Evaluation  Patient identified by MRN, date of birth, ID band Patient awake    Reviewed: Allergy & Precautions, H&P , NPO status , Patient's Chart, lab work & pertinent test results  Airway Mallampati: II   Neck ROM: full    Dental   Pulmonary former smoker,          Cardiovascular hypertension,     Neuro/Psych    GI/Hepatic   Endo/Other    Renal/GU Renal InsufficiencyRenal disease     Musculoskeletal  (+) Arthritis -,   Abdominal   Peds  Hematology  (+) anemia ,   Anesthesia Other Findings   Reproductive/Obstetrics                             Anesthesia Physical Anesthesia Plan  ASA: II  Anesthesia Plan: General   Post-op Pain Management:    Induction: Intravenous  Airway Management Planned: Oral ETT  Additional Equipment:   Intra-op Plan:   Post-operative Plan: Extubation in OR  Informed Consent: I have reviewed the patients History and Physical, chart, labs and discussed the procedure including the risks, benefits and alternatives for the proposed anesthesia with the patient or authorized representative who has indicated his/her understanding and acceptance.     Plan Discussed with: Anesthesiologist, CRNA and Surgeon  Anesthesia Plan Comments:         Anesthesia Quick Evaluation

## 2014-08-07 NOTE — Clinical Documentation Improvement (Signed)
Please clarify the level of malnutrition.  . Severity: --Mild (first degree) --Moderate (second degree) --Severe (third degree) . Avoid documenting a range of severity, such as "moderate to severe"  --Other . Document any associated diagnoses/conditions  Supporting Information:INITIAL NUTRITION ASSESSMENT 08/06/14  Patient meets criteria for severe malnutrition in the context of chronic illness as evidenced by severe muscle loss and severe subcutaneous fat loss BMI=23.4 INTERVENTION:  Ensure Complete po BID, each supplement provides 350 kcal and 13 grams of protein  Thank You, Melvia Heaps, RN, BSN, CDI [employer] [phone] [email or web address]

## 2014-08-07 NOTE — Op Note (Signed)
08/05/2014 - 08/07/2014  5:38 PM  PATIENT:  Alicia Guerra    PRE-OPERATIVE DIAGNOSIS:  LEFT FEMUR FRACTURE  POST-OPERATIVE DIAGNOSIS:  Same  PROCEDURE:  OPEN REDUCTION INTERNAL FIXATION (ORIF)  LEFT FEMUR FRACTURE  SURGEON:  [name], D, MD  ASSISTANT: Joya Gaskins, OPA, He was necessary for efficiency and safety of the case.   ANESTHESIA:   Gen  PREOPERATIVE INDICATIONS:  Alicia Guerra is a  80 y.o. female with a diagnosis of LEFT FEMUR FRACTURE who failed conservative measures and elected for surgical management.    The risks benefits and alternatives were discussed with the patient preoperatively including but not limited to the risks of infection, bleeding, nerve injury, cardiopulmonary complications, the need for revision surgery, among others, and the patient was willing to proceed.  OPERATIVE IMPLANTS: Stryker lateral locking plate  BLOOD LOSS: 40  COMPLICATIONS: none  TOURNIQUET TIME: none  OPERATIVE PROCEDURE:  Patient was identified in the preoperative holding area and site was marked by me She was transported to the operating theater and placed on the table in supine position taking care to pad all bony prominences. After a preincinduction time out anesthesia was induced. The left lower extremity was prepped and draped in normal sterile fashion and a pre-incision timeout was performed. She received ancef for preoperative antibiotics.   I made a lateral incision of roughly 8 cm over her fracture site and down over her lateral condyle. I dissected down to the IT band and incise this longitudinally. I then exposed the fracture cleared off the vastus lateralis that it slipped within the fracture and elevated the patella.  I was unable to palpate the intertrochlear groove and feel the step-off fibula fracture here is able to palpate a smooth reduction and then placed a K wire across this. I then selected a lateral locking plate.  I took multiple x-rays was  happy with the reduction of the condyles I placed a lag screw through the locking plate to hold lisinopril. I then placed all the distal locking screws took multiple x-rays and was happy with the fixation on the distal block. I then fixed this back to the shaft was very happy with the reduction on multiple views I placed 2 lag screws to the shaft and 2 locking screws. I then took multiple x-rays and was happy with the reduction and the third of all hardware. I then thoroughly irrigated the wound and closed the lateral capsule and IT band over the plate and closed the skin.  A sterile dressing was applied tissues taking the PACU in stable condition.  POST OPERATIVE PLAN: NWB, knee immobilizer full time. DVT px per primary    This note was generated using a template and dragon dictation system. In light of that, I have reviewed the note and all aspects of it are applicable to this case. Any dictation errors are due to the computerized dictation system.

## 2014-08-07 NOTE — Progress Notes (Signed)
[facility] PROGRESS NOTE  Alicia Guerra [id number] DOB: 06-Jan-1924 DOA: 08/05/2014 PCP: [name], MD   Brief narrative 80 year old female being independently prior to admission presented to [facility] ED after sustaining a fall when she slid down the steps and hit her left knee. Extraocular left knee none in the ED showed comminuted fracture of the distal femoral diaphysis with angulation and displacement at thie the fracture site. Patient transferred to [name] for orthopedic evaluation. Patient was also found to have a hemoglobin of 5.1 with guaiac-positive stools.  On day 2 of admission patient febrile, encephalopathic with increased leukocytosis and right lung pneumonia. Also has UTI.   Assessment/Plan: SIRS with acute encephalopathy Secondary to community acquired versus aspiration pneumonia. Patient mains confused. UA also suggestive of UTI. Urine culture sent. Follow blood cultures. On empiric Rocephin and azithromycin. Continue to monitor. Supportive care with IV fluids. Avoid benzos and minimize narcotics. Seen by swallow eval and recommends dysphagia level III diet and will reassess while in the hospital.   Left knee and displaced distal femoral metaphysis fracture -Ortho following. For OR this afternoon. She will need skilled nursing facility upon discharge. PT evaluation postop. Pain control with when necessary narcotics. (Minimize given acute encephalopathy) Patient tachycardic and blood pressure elevated. Will place on atenolol 25 mg daily for perioperative beta blockade.  Microcytic anemia iron panel suggests iron deficiency. Patient guiac positive in the ED. contacted PCP office but seems to be closed until 12/8. Hemoglobin has improved with PRBC. If stays stable during hospital stay will recommend outpatient GI evaluation B12 is normal.  Acute versus acute on Chronic kidney disease No old labs in the system. Check FENa. Renal function  noted. Also noted for mild metabolic acidosis. Continue to monitor with IV fluids. If not improved will obtain renal ultrasound.   Severe Protein Calorie malnutrition Nutrition consult appreciated. Added ensure twice a day  DVT prophylaxis: SCDs. Will need prophylaxis postop  Code Status: Full code Family Communication: None at bedside Disposition Plan: needs SNF post op   Consultants:  ortho  Procedures:  For OR today  Antibiotics:  rocephin and azithromycin 12/2--  HPI/Subjective: Since seen and examined.  Objective: Filed Vitals:   08/07/14 1156  BP: 184/88  Pulse: 120  Temp:   Resp:     Intake/Output Summary (Last 24 hours) at 08/07/14 1219 Last data filed at 08/07/14 0900  Gross per 24 hour  Intake   1200 ml  Output    600 ml  Net    600 ml   Filed Weights   08/05/14 0824  Weight: 54.432 kg (120 lb)    Exam:   General:  Elderly thin built female lying in bed in no acute distress, appears very confused  HEENT: Hoarse voice, pallor present, moist oral mucosa  Chest: Right basilar crackles, no rhonchi or wheeze  CVS: Normal S1 and S2, no murmurs  Abdomen: Soft, nondistended, nontender, bowel sounds present  Extremities: Warm, left knee brace  CNS: AAOX 0  Data Reviewed: Basic Metabolic Panel:  Recent Labs Lab 08/05/14 0940 08/06/14 0535 08/07/14 0324  NA 143 140 135*  K 4.4 4.9 5.3  CL 107 107 101  CO2 21 18* 17*  GLUCOSE 131* 160* 160*  BUN 25* 24* 24*  CREATININE 1.66* 1.73* 1.77*  CALCIUM 8.9 8.6 9.1   Liver Function Tests:  Recent Labs Lab 08/05/14 0940  AST 15  ALT 7  ALKPHOS 77  BILITOT 0.2*  PROT 6.9  ALBUMIN  3.6   No results for input(s): LIPASE, AMYLASE in the last 168 hours. No results for input(s): AMMONIA in the last 168 hours. CBC:  Recent Labs Lab 08/05/14 0940 08/06/14 0535 08/07/14 0324  WBC 8.5 14.4* 18.2*  NEUTROABS 7.1  --   --   HGB 5.1* 8.9* 9.5*  HCT 17.0* 28.5* 30.0*  MCV 66.1* 72.0*  72.5*  PLT 486* 412* 422*   Cardiac Enzymes: No results for input(s): CKTOTAL, CKMB, CKMBINDEX, TROPONINI in the last 168 hours. BNP (last 3 results) No results for input(s): PROBNP in the last 8760 hours. CBG: No results for input(s): GLUCAP in the last 168 hours.  Recent Results (from the past 240 hour(s))  Surgical pcr screen     Status: None   Collection Time: 08/06/14  2:00 PM  Result Value Ref Range Status   MRSA, PCR NEGATIVE NEGATIVE Final   Staphylococcus aureus NEGATIVE NEGATIVE Final    Comment:        The Xpert SA Assay (FDA approved for NASAL specimens in patients over 8 years of age), is one component of a comprehensive surveillance program.  Test performance has been validated by [employer] for patients greater than or equal to 64 year old. It is not intended to diagnose infection nor to guide or monitor treatment.   Culture, blood (routine x 2)     Status: None (Preliminary result)   Collection Time: 08/06/14  4:10 PM  Result Value Ref Range Status   Specimen Description BLOOD RIGHT HAND  Final   Special Requests BOTTLES DRAWN AEROBIC ONLY 3 CC  Final   Culture  Setup Time   Final    08/06/2014 21:17 Performed at [facility]    Culture   Final           BLOOD CULTURE RECEIVED NO GROWTH TO DATE CULTURE WILL BE HELD FOR 5 DAYS BEFORE ISSUING A FINAL NEGATIVE REPORT Performed at [facility]    Report Status PENDING  Incomplete  Culture, blood (routine x 2)     Status: None (Preliminary result)   Collection Time: 08/06/14  4:20 PM  Result Value Ref Range Status   Specimen Description BLOOD LEFT HAND  Final   Special Requests BOTTLES DRAWN AEROBIC ONLY 6 CC  Final   Culture  Setup Time   Final    08/06/2014 21:17 Performed at [facility]    Culture   Final           BLOOD CULTURE RECEIVED NO GROWTH TO DATE CULTURE WILL BE HELD FOR 5 DAYS BEFORE ISSUING A FINAL NEGATIVE REPORT Performed at [facility]    Report  Status PENDING  Incomplete     Studies: Ct Knee Left Wo Contrast  08/06/2014   CLINICAL DATA:  Status post fall, left femoral fracture  EXAM: CT OF THE LEFT KNEE WITHOUT CONTRAST  TECHNIQUE: Multidetector CT imaging of the LEFT knee was performed according to the standard protocol. Multiplanar CT image reconstructions were also generated.  COMPARISON:  None.  FINDINGS: There is a comminuted and impacted distal femoral metaphysis fracture. The fracture cleft extends to the trochlear groove and intercondylar notch. There is a fracture cleft at the a ACL origin. There is approximately 12.5 mm of diastases at the trochlea. There is apex anterior angulation. There is mild apex medial angulation. There is no other fracture or dislocation. There is no significant joint effusion.  There is no soft tissue mass, hematoma or fluid collection.  There is mild edema in the popliteal fossa. There is peripheral vascular atherosclerotic disease.  IMPRESSION: Comminuted, displaced, angulated and impacted distal femoral metaphysis fracture as described above.   Electronically Signed   By: Kathreen Devoid   On: 08/06/2014 07:59   Dg Chest Port 1 View  08/06/2014   CLINICAL DATA:  Fever.  Subsequent encounter.  EXAM: PORTABLE CHEST - 1 VIEW  COMPARISON:  08/05/2014.  FINDINGS: Development of RIGHT perihilar airspace disease. This appears to involve all lobes of the RIGHT lung. In a patient with fever, this is suspicious for multifocal pneumonia. Aspiration pneumonitis or asymmetric pulmonary edema are less likely.  The cardiopericardial silhouette is within normal limits. The LEFT lung appears unchanged, without airspace disease. No effusion. Monitoring leads project over the chest.  Aortic arch atherosclerosis. Asymmetric pleural apical thickening is present, greater on the LEFT than RIGHT.  IMPRESSION: Development of RIGHT perihilar airspace disease favored to represent pneumonia.   Electronically Signed   By: Dereck Ligas M.D.    On: 08/06/2014 10:29    Scheduled Meds: . sodium chloride   Intravenous Once  . atenolol  25 mg Oral Daily  . azithromycin  500 mg Intravenous Daily  . cefTRIAXone (ROCEPHIN)  IV  1 g Intravenous Q24H  . feeding supplement (ENSURE COMPLETE)  237 mL Oral BID BM  . sodium chloride  3 mL Intravenous Q12H   Continuous Infusions: . sodium chloride 75 mL/hr at 08/06/14 1700      Time spent: 35 minutes    Dabria Wadas, [place] Pager [phone] If 7PM-7AM, please contact night-coverage at [email or web address], password [facility] 08/07/2014, 12:19 PM  LOS: 2 days

## 2014-08-07 NOTE — Progress Notes (Signed)
SLP Cancellation Note  Patient Details Name: Alicia Guerra MRN: [id number] DOB: 10-31-1923   Cancelled treatment:       Reason Eval/Treat Not Completed: Patient at procedure or test/unavailable, NPO for surgery   Nicoletta Hush, Katherene Ponto 08/07/2014, 8:52 AM

## 2014-08-07 NOTE — Discharge Instructions (Signed)
No weight on your left leg.  Wear your knee immobilizer whenever out of bed  Keep your incisions dry and covered until follow up

## 2014-08-07 NOTE — Transfer of Care (Signed)
Immediate Anesthesia Transfer of Care Note  Patient: Alicia Guerra  Procedure(s) Performed: Procedure(s): OPEN REDUCTION INTERNAL FIXATION (ORIF)  LEFT FEMUR FRACTURE (Left)  Patient Location: PACU  Anesthesia Type:General  Level of Consciousness: awake, alert  and oriented  Airway & Oxygen Therapy: Patient Spontanous Breathing and Patient connected to nasal cannula oxygen  Post-op Assessment: Report given to PACU RN and Post -op Vital signs reviewed and stable  Post vital signs: Reviewed and stable  Complications: No apparent anesthesia complications

## 2014-08-07 NOTE — Anesthesia Postprocedure Evaluation (Signed)
  Anesthesia Post-op Note  Patient: Alicia Guerra  Procedure(s) Performed: Procedure(s): OPEN REDUCTION INTERNAL FIXATION (ORIF)  LEFT FEMUR FRACTURE (Left)  Patient Location: PACU  Anesthesia Type: General   Level of Consciousness: awake, alert  and oriented  Airway and Oxygen Therapy: Patient Spontanous Breathing  Post-op Pain: mild  Post-op Assessment: Post-op Vital signs reviewed  Post-op Vital Signs: Reviewed  Last Vitals:  Filed Vitals:   08/07/14 1845  BP: 157/96  Pulse: 90  Temp:   Resp: 19    Complications: No apparent anesthesia complications

## 2014-08-07 NOTE — Progress Notes (Signed)
Pt heart rhythm was NSR with occaisonal PVC upon arrival to PACU, now in trigeminy, Dr.Crews aware, no new orders, pt will cont tele on 2W

## 2014-08-07 NOTE — Interval H&P Note (Signed)
History and Physical Interval Note:  08/07/2014 11:31 AM  Alicia Guerra  has presented today for surgery, with the diagnosis of LEFT FEMUR FRACTURE  The various methods of treatment have been discussed with the patient and family. After consideration of risks, benefits and other options for treatment, the patient has consented to  Procedure(s): OPEN REDUCTION INTERNAL FIXATION (ORIF)  LEFT FEMUR FRACTURE (Left) as a surgical intervention .  The patient's history has been reviewed, patient examined, no change in status, stable for surgery.  I have reviewed the patient's chart and labs.  Questions were answered to the patient's satisfaction.     MURPHY, TIMOTHY, D

## 2014-08-08 ENCOUNTER — Inpatient Hospital Stay (HOSPITAL_COMMUNITY): Payer: Medicare HMO

## 2014-08-08 ENCOUNTER — Encounter (HOSPITAL_COMMUNITY): Payer: Self-pay | Admitting: Orthopedic Surgery

## 2014-08-08 DIAGNOSIS — E872 Acidosis, unspecified: Secondary | ICD-10-CM | POA: Diagnosis present

## 2014-08-08 DIAGNOSIS — N179 Acute kidney failure, unspecified: Secondary | ICD-10-CM | POA: Diagnosis present

## 2014-08-08 DIAGNOSIS — J189 Pneumonia, unspecified organism: Secondary | ICD-10-CM | POA: Diagnosis not present

## 2014-08-08 DIAGNOSIS — D509 Iron deficiency anemia, unspecified: Secondary | ICD-10-CM | POA: Diagnosis present

## 2014-08-08 DIAGNOSIS — S72302A Unspecified fracture of shaft of left femur, initial encounter for closed fracture: Secondary | ICD-10-CM

## 2014-08-08 DIAGNOSIS — S72355D Nondisplaced comminuted fracture of shaft of left femur, subsequent encounter for closed fracture with routine healing: Secondary | ICD-10-CM | POA: Diagnosis present

## 2014-08-08 DIAGNOSIS — G9340 Encephalopathy, unspecified: Secondary | ICD-10-CM | POA: Clinically undetermined

## 2014-08-08 DIAGNOSIS — N390 Urinary tract infection, site not specified: Secondary | ICD-10-CM | POA: Diagnosis present

## 2014-08-08 DIAGNOSIS — S72302D Unspecified fracture of shaft of left femur, subsequent encounter for closed fracture with routine healing: Secondary | ICD-10-CM

## 2014-08-08 HISTORY — DX: Unspecified fracture of shaft of left femur, initial encounter for closed fracture: S72.302A

## 2014-08-08 LAB — BASIC METABOLIC PANEL
Anion gap: 19 — ABNORMAL HIGH (ref 5–15)
BUN: 29 mg/dL — ABNORMAL HIGH (ref 6–23)
CO2: 14 mEq/L — ABNORMAL LOW (ref 19–32)
Calcium: 8.4 mg/dL (ref 8.4–10.5)
Chloride: 102 mEq/L (ref 96–112)
Creatinine, Ser: 2.01 mg/dL — ABNORMAL HIGH (ref 0.50–1.10)
GFR calc Af Amer: 24 mL/min — ABNORMAL LOW (ref >90)
GFR, EST NON AFRICAN AMERICAN: 21 mL/min — AB (ref >90)
Glucose, Bld: 108 mg/dL — ABNORMAL HIGH (ref 70–99)
POTASSIUM: 4.9 meq/L (ref 3.7–5.3)
SODIUM: 135 meq/L — AB (ref 137–147)

## 2014-08-08 LAB — CBC
HCT: 26.4 % — ABNORMAL LOW (ref 36.0–46.0)
HEMOGLOBIN: 8.4 g/dL — AB (ref 12.0–15.0)
MCH: 23.2 pg — ABNORMAL LOW (ref 26.0–34.0)
MCHC: 31.8 g/dL (ref 30.0–36.0)
MCV: 72.9 fL — ABNORMAL LOW (ref 78.0–100.0)
Platelets: 341 10*3/uL (ref 150–400)
RBC: 3.62 MIL/uL — AB (ref 3.87–5.11)
RDW: 21.2 % — ABNORMAL HIGH (ref 11.5–15.5)
WBC: 12.8 10*3/uL — AB (ref 4.0–10.5)

## 2014-08-08 LAB — MAGNESIUM: Magnesium: 2.1 mg/dL (ref 1.5–2.5)

## 2014-08-08 MED ORDER — SODIUM BICARBONATE 8.4 % IV SOLN
INTRAVENOUS | Status: DC
  Filled 2014-08-08 (×6): qty 1000

## 2014-08-08 MED ORDER — SODIUM BICARBONATE 8.4 % IV SOLN
INTRAVENOUS | Status: DC
  Administered 2014-08-08: 11:00:00 via INTRAVENOUS
  Filled 2014-08-08 (×2): qty 150

## 2014-08-08 MED ORDER — HEPARIN SODIUM (PORCINE) 5000 UNIT/ML IJ SOLN
5000.0000 [IU] | Freq: Three times a day (TID) | INTRAMUSCULAR | Status: DC
  Administered 2014-08-08 – 2014-08-15 (×20): 5000 [IU] via SUBCUTANEOUS
  Filled 2014-08-08 (×21): qty 1

## 2014-08-08 MED ORDER — AZITHROMYCIN 500 MG PO TABS
500.0000 mg | ORAL_TABLET | Freq: Every day | ORAL | Status: DC
  Administered 2014-08-09: 500 mg via ORAL
  Filled 2014-08-08 (×3): qty 1

## 2014-08-08 NOTE — Progress Notes (Signed)
[facility] PROGRESS NOTE  Alicia Guerra [id number] DOB: [birth year] DOA: 08/05/2014 PCP: [name], MD  Brief narrative 80 year old female being independently prior to admission presented to [facility] ED after sustaining a fall when she slid down the steps and hit her left knee. Extraocular left knee none in the ED showed comminuted fracture of the distal femoral diaphysis with angulation and displacement at thie the fracture site. Patient transferred to [name] for orthopedic evaluation. Patient was also found to have a hemoglobin of 5.1 with guaiac-positive stools.  On day 2 of admission patient febrile, encephalopathic with increased leukocytosis and right lung pneumonia. Also has UTI.   Assessment/Plan: SIRS with acute encephalopathy Secondary to community acquired versus aspiration pneumonia.  Patient confused since day 2 of admission, and improving today. UA suggestive of UTI. Urine culture sent. Follow blood cultures Negative. On empiric Rocephin and azithromycin. Afebrile past 24 hrs Continue to monitor. Supportive care with IV fluids. Avoid benzos and minimize narcotics. Seen by swallow eval and recommends dysphagia level III diet and will reassess while in the hospital.   Left knee and displaced distal femoral metaphysis fracture -s/p ORIF on 12/3 . tolerated well   PT evaluation recommends SNF. Marland Kitchen Pain control with when necessary narcotics. (Minimize given acute encephalopathy) On low dose atenolol for perioperative beta blockade.  Microcytic anemia iron panel suggests iron deficiency. Patient guiac positive in the ED. contacted PCP office but seems to be closed until 12/8. Hemoglobin has improved with 2 u PRBC on admission. Slight drop today likely post op B12 is normal. Needs outpt GI referral if H&h remains stable  Acute versus acute on Chronic kidney disease No old labs in the system. Check FENa<1. Renal function worsening Also having   gmetabolic acidosis. Check  renal ultrasound. Switch fluid to D5 with 2 amps of bicarb.   Severe Protein Calorie malnutrition Nutrition consult appreciated. Added ensure twice a day  DVT prophylaxis: sq heparin and full dose ASA  Code Status: Full code  Family Communication: None at bedside today Disposition Plan: needs SNF . Possibly on 12/7   Consultants:  ortho  Procedures:  ORIF left femur fracture on 12/3  Antibiotics:  rocephin and azithromycin 12/2--  HPI/Subjective: Pt  seen and examined this am. Appears more alert and oriented.   Objective: Filed Vitals:   08/08/14 0927  BP:   Pulse: 102  Temp:   Resp:     Intake/Output Summary (Last 24 hours) at 08/08/14 1116 Last data filed at 08/08/14 0600  Gross per 24 hour  Intake   1695 ml  Output    700 ml  Net    995 ml   Filed Weights   08/05/14 0824  Weight: 54.432 kg (120 lb)    Exam:   General: Elderly thin built female lying in bed in no acute distress, more alert and oriented today.  HEENT: Hoarse voice, pallor present, moist oral mucosa  Chest: Right basilar crackles, no rhonchi or wheeze  CVS: Normal S1 and S2, no murmurs  Abdomen: Soft, nondistended, nontender, bowel sounds present, Foley in palce  Extremities: Warm, left knee brace  CNS: AAOX 2  Data Reviewed: Basic Metabolic Panel:  Recent Labs Lab 08/05/14 0940 08/06/14 0535 08/07/14 0324 08/08/14 0300  NA 143 140 135* 135*  K 4.4 4.9 5.3 4.9  CL 107 107 101 102  CO2 21 18* 17* 14*  GLUCOSE 131* 160* 160* 108*  BUN 25* 24* 24* 29*  CREATININE 1.66* 1.73*  1.77* 2.01*  CALCIUM 8.9 8.6 9.1 8.4  MG  --   --   --  2.1   Liver Function Tests:  Recent Labs Lab 08/05/14 0940  AST 15  ALT 7  ALKPHOS 77  BILITOT 0.2*  PROT 6.9  ALBUMIN 3.6   No results for input(s): LIPASE, AMYLASE in the last 168 hours. No results for input(s): AMMONIA in the last 168 hours. CBC:  Recent Labs Lab 08/05/14 0940 08/06/14 0535  08/28/2014 0324 08/08/14 0300  WBC 8.5 14.4* 18.2* 12.8*  NEUTROABS 7.1  --   --   --   HGB 5.1* 8.9* 9.5* 8.4*  HCT 17.0* 28.5* 30.0* 26.4*  MCV 66.1* 72.0* 72.5* 72.9*  PLT 486* 412* 422* 341   Cardiac Enzymes: No results for input(s): CKTOTAL, CKMB, CKMBINDEX, TROPONINI in the last 168 hours. BNP (last 3 results) No results for input(s): PROBNP in the last 8760 hours. CBG: No results for input(s): GLUCAP in the last 168 hours.  Recent Results (from the past 240 hour(s))  Surgical pcr screen     Status: None   Collection Time: 08/06/14  2:00 PM  Result Value Ref Range Status   MRSA, PCR NEGATIVE NEGATIVE Final   Staphylococcus aureus NEGATIVE NEGATIVE Final    Comment:        The Xpert SA Assay (FDA approved for NASAL specimens in patients over 17 years of age), is one component of a comprehensive surveillance program.  Test performance has been validated by [employer] for patients greater than or equal to 21 year old. It is not intended to diagnose infection nor to guide or monitor treatment.   Culture, blood (routine x 2)     Status: None (Preliminary result)   Collection Time: 08/06/14  4:10 PM  Result Value Ref Range Status   Specimen Description BLOOD RIGHT HAND  Final   Special Requests BOTTLES DRAWN AEROBIC ONLY 3 CC  Final   Culture  Setup Time   Final    08/06/2014 21:17 Performed at [facility]    Culture   Final           BLOOD CULTURE RECEIVED NO GROWTH TO DATE CULTURE WILL BE HELD FOR 5 DAYS BEFORE ISSUING A FINAL NEGATIVE REPORT Performed at [facility]    Report Status PENDING  Incomplete  Culture, blood (routine x 2)     Status: None (Preliminary result)   Collection Time: 08/06/14  4:20 PM  Result Value Ref Range Status   Specimen Description BLOOD LEFT HAND  Final   Special Requests BOTTLES DRAWN AEROBIC ONLY 6 CC  Final   Culture  Setup Time   Final    08/06/2014 21:17 Performed at [facility]    Culture    Final           BLOOD CULTURE RECEIVED NO GROWTH TO DATE CULTURE WILL BE HELD FOR 5 DAYS BEFORE ISSUING A FINAL NEGATIVE REPORT Performed at [facility]    Report Status PENDING  Incomplete     Studies: Dg Knee 1-2 Views Left  08-28-2014   CLINICAL DATA:  ORIF distal left femur  EXAM: DG C-ARM 61-120 MIN; LEFT KNEE - 1-2 VIEW  FLUOROSCOPY TIME:  30 seconds  COMPARISON:  None  FINDINGS: Comminuted distal femoral diametaphyseal fracture transfixed by a lateral sideplate with multiple interlocking screws. The fracture is near anatomic alignment. There are postsurgical changes in the surrounding soft tissues.  There is peripheral vascular atherosclerotic  disease.  IMPRESSION: ORIF distal left femoral fracture.   Electronically Signed   By: Kathreen Devoid   On: 08/07/2014 17:59   Dg Knee Left Port  08/08/2014   CLINICAL DATA:  Internal fixation of femur fracture.  EXAM: PORTABLE LEFT KNEE - 1-2 VIEW  COMPARISON:  08/05/2014  FINDINGS: Plate and screw fixation traversing a distal femoral fracture is noted, in near-anatomic alignment and position.  No complicating features are identified.  IMPRESSION: Internal fixation of distal femur fracture, in near-anatomic alignment and position.   Electronically Signed   By: Hassan Rowan M.D.   On: 08/08/2014 02:47   Dg C-arm 1-60 Min  08/07/2014   CLINICAL DATA:  ORIF distal left femur  EXAM: DG C-ARM 61-120 MIN; LEFT KNEE - 1-2 VIEW  FLUOROSCOPY TIME:  30 seconds  COMPARISON:  None  FINDINGS: Comminuted distal femoral diametaphyseal fracture transfixed by a lateral sideplate with multiple interlocking screws. The fracture is near anatomic alignment. There are postsurgical changes in the surrounding soft tissues.  There is peripheral vascular atherosclerotic disease.  IMPRESSION: ORIF distal left femoral fracture.   Electronically Signed   By: Kathreen Devoid   On: 08/07/2014 17:59    Scheduled Meds: . sodium chloride   Intravenous Once  . aspirin EC  325 mg Oral  Q breakfast  . atenolol  25 mg Oral Daily  . azithromycin  500 mg Oral Daily  . cefTRIAXone (ROCEPHIN)  IV  1 g Intravenous Q24H  . feeding supplement (ENSURE COMPLETE)  237 mL Oral BID BM  . sodium chloride  3 mL Intravenous Q12H   Continuous Infusions: . sodium chloride 75 mL/hr at 08/08/14 0600  .  sodium bicarbonate  infusion 1000 mL        Time spent: 25 minutes    Ladasha Schnackenberg  [facility] Pager [phone] If 7PM-7AM, please contact night-coverage at [email or web address], password [facility] 08/08/2014, 11:16 AM  LOS: 3 days

## 2014-08-08 NOTE — Clinical Social Work Note (Signed)
Met with patient and her niece West Carbo to discuss SNF placement once patient is medically ready and discharge orders are given.  Patient lives in [place] by herself, but her niece and family are in in [place].  Patient asked for her niece West Carbo (cell [phone], home [phone]) to be the main contact person for patient.  Patient and family are in agreement to going to SNF for short term rehab once she is medically ready and discharge orders have been given.  Jones Broom. Elrod, MSW, [place] 08/08/2014 5:28 PM

## 2014-08-08 NOTE — Plan of Care (Signed)
Problem: SLP Dysphagia Goals Goal: Patient will utilize recommended strategies Patient will utilize recommended strategies during swallow to increase swallowing safety with  Outcome: Completed/Met Date Met:  08/08/14

## 2014-08-08 NOTE — Progress Notes (Signed)
Speech Language Pathology Treatment: Dysphagia  Patient Details Name: JAQUESHA BOROFF MRN: [id number] DOB: [birth year] Today's Date: 08/08/2014 Time: 9037-9558 SLP Time Calculation (min) (ACUTE ONLY): 10 min  Assessment / Plan / Recommendation Clinical Impression  Pt seen briefly with lunch meal. NO evidence of aspiration seen with thin liquids, airway protection adequate. Pt struggled with dys 3 (mechanical soft) solids however with extremely prolonged mastication of baked fish. SLP had to remove bolus manually as pt did not transit and swallow. Recommend downgrade to dys 1 (puree) in order to facilitate intake. SLP at SNF to follow for upgrade.    HPI HPI: 80 year old female living independently prior to admission presented to [facility] ED after sustaining a fall when she slid down the steps and hit her left knee. Extraocular left knee in the ED showed comminuted fracture of the distal femoral diaphysis with angulation and displacement at thie the fracture site. Patient transferred to [name] for orthopedic evaluation. Patient was also found to have a hemoglobin of 5.1 with guaiac-positive stools. CXR is concerning for new RLL PNA, therefore SLP swallow evaluation was ordered.   Pertinent Vitals    SLP Plan  All goals met    Recommendations Diet recommendations: Dysphagia 1 (puree);Thin liquid Liquids provided via: Teaspoon Medication Administration: Whole meds with puree Supervision: Patient able to self feed;Full supervision/cueing for compensatory strategies Compensations: Slow rate;Small sips/bites Postural Changes and/or Swallow Maneuvers: Seated upright 90 degrees              Plan: All goals met    GO     Valjean Ruppel, Katherene Ponto 08/08/2014, 1:49 PM

## 2014-08-08 NOTE — Evaluation (Signed)
Physical Therapy Evaluation Patient Details Name: Alicia Guerra MRN: [id number] DOB: [birth year] Today's Date: 08/08/2014   History of Present Illness  Pt is an 80 y.o. female with PMH: arthritis, HTN, anemia with distal left femur fracture after a fall.  Now s/p ORIF Left femur on 08/07/14.    Clinical Impression  Patient is not initiating any movement on her own and has notable cognitive deficits.  She has a fear of falling and pain that is limiting her mobility.  Pt completed treatment without supplemental O2 and sats remained above 90% throughout the treatment session.  She would benefit from skilled therapy to address deficits listed below (see PT problem list) to improve functional mobility in order to decrease burden of care and return to PLOF.    Follow Up Recommendations Supervision/Assistance - 24 hour;SNF    Equipment Recommendations  Rolling walker with 5" wheels;3in1 (PT)    Recommendations for Other Services       Precautions / Restrictions Precautions Precautions: Fall Required Braces or Orthoses: Knee Immobilizer - Left Knee Immobilizer - Left: On at all times Restrictions Weight Bearing Restrictions: Yes Other Position/Activity Restrictions: NWB LLE; discussed with pt who is unable to state precautions      Mobility  Bed Mobility Overal bed mobility: +2 for physical assistance;Needs Assistance Bed Mobility: Supine to Sit     Supine to sit: Total assist;+2 for physical assistance;HOB elevated     General bed mobility comments: Pt not initiating any movement even with verbal and tactile cues, total reliance on therapists to move to seated position.    Transfers Overall transfer level: Needs assistance Equipment used: Rolling walker (2 wheeled) Transfers: Sit to/from [employer] Sit to Stand: Total assist;+2 physical assistance (x1 sit-to-partial stand with RW)   Squat pivot transfers: Total assist;+2 physical assistance (x1)      General transfer comment: Pt bearing weight through RLE during transfers but otherwise no participation, even with verbal cues.  Pt able to maintain NWB LLE with total assist from therapist to keep leg off the ground.  Ambulation/Gait                Stairs            Wheelchair Mobility    Modified Rankin (Stroke Patients Only)       Balance Overall balance assessment: Needs assistance;History of Falls Sitting-balance support: Bilateral upper extremity supported;Feet supported Sitting balance-Leahy Scale: Zero Sitting balance - Comments: Pt unable to maintain seated balance without therapist supporting back to prevent LOB Postural control: Posterior lean Standing balance support: Bilateral upper extremity supported;During functional activity Standing balance-Leahy Scale: Zero Standing balance comment: Pt unable to stand without total assist +2                             Pertinent Vitals/Pain Pain Assessment: 0-10 Pain Score: 9  Pain Location: left hip; 9/10 at end of session Pain Intervention(s): Monitored during session;Repositioned  SpO2 at rest: 95% (RA) HR at rest: 102 bpm    Home Living Family/patient expects to be discharged to:: Private residence Living Arrangements: Alone Available Help at Discharge: Family (Niece) Type of Home: Apartment Home Access: Stairs to enter     Home Layout: One level Home Equipment: Environmental consultant - 2 wheels;Shower seat;Toilet riser (Unreliable information, pt stated that she did have this equipment at start of session and claimed she didn't have them at end of session) Additional Comments: All per  pt report, however there were some inconsistencies in answers when she was asked the same question more than once    Prior Function Level of Independence: Independent with assistive device(s)         Comments: Question accuracy due to differing pt responses throughout session     Hand Dominance   Dominant Hand:  Right    Extremity/Trunk Assessment   Upper Extremity Assessment: Generalized weakness           Lower Extremity Assessment: Generalized weakness;LLE deficits/detail   LLE Deficits / Details: s/p ORIF left femur; KI on at all times; NWB  Cervical / Trunk Assessment: Kyphotic  Communication   Communication: No difficulties  Cognition Arousal/Alertness: Awake/alert Behavior During Therapy: WFL for tasks assessed/performed Overall Cognitive Status: No family/caregiver present to determine baseline cognitive functioning Area of Impairment: Orientation;Memory Orientation Level: Disoriented to;Time   Memory: Decreased short-term memory;Decreased recall of precautions              General Comments      Exercises        Assessment/Plan    PT Assessment Patient needs continued PT services  PT Diagnosis Difficulty walking;Generalized weakness;Acute pain;Altered mental status   PT Problem List Decreased strength;Decreased activity tolerance;Decreased balance;Decreased mobility;Decreased cognition;Decreased knowledge of use of DME;Decreased safety awareness;Decreased knowledge of precautions;Pain  PT Treatment Interventions DME instruction;Gait training;Functional mobility training;Therapeutic activities;Therapeutic exercise;Balance training;Cognitive remediation;Patient/family education   PT Goals (Current goals can be found in the Care Plan section) Acute Rehab PT Goals Patient Stated Goal: to go home PT Goal Formulation: With patient Time For Goal Achievement: 08/22/14 Potential to Achieve Goals: Fair    Frequency Min 3X/week   Barriers to discharge Decreased caregiver support Niece available intermittently; pt requires high level of care    Co-evaluation PT/OT/SLP Co-Evaluation/Treatment: Yes Reason for Co-Treatment: For patient/therapist safety PT goals addressed during session: Mobility/safety with mobility;Balance;Proper use of DME OT goals addressed during  session: ADL's and self-care       End of Session Equipment Utilized During Treatment: Gait belt;Left knee immobilizer Activity Tolerance: Patient limited by lethargy;Patient limited by pain (Patient limited by weakness) Patient left: in chair;with call bell/phone within reach Nurse Communication: Mobility status;Weight bearing status         Time: [id number] PT Time Calculation (min) (ACUTE ONLY): 22 min   Charges:   PT Evaluation $Initial PT Evaluation Tier I: 1 Procedure PT Treatments $Therapeutic Activity: 8-22 mins   PT G Codes:          Adileny Delon SPT 08/08/2014, 9:46 AM

## 2014-08-08 NOTE — Progress Notes (Signed)
Patient ID: Alicia Guerra, female   DOB: 11/05/1923, 80 y.o.   MRN: [id number]     Subjective:  Patient reports pain as mild to moderate.  Denies any CP or SOB  Objective:   VITALS:   Filed Vitals:   08/08/14 0339 08/08/14 0927 08/08/14 1200 08/08/14 1449  BP: 156/66   95/53  Pulse: 99 102  88  Temp: 98.8 F (37.1 C)     TempSrc: Axillary   Oral  Resp: 20  20 18   Height:      Weight:      SpO2: 100% 95% 93%     ABD soft Sensation intact distally Dorsiflexion/Plantar flexion intact Incision: dressing C/D/I and no drainage   Lab Results  Component Value Date   WBC 12.8* 08/08/2014   HGB 8.4* 08/08/2014   HCT 26.4* 08/08/2014   MCV 72.9* 08/08/2014   PLT 341 08/08/2014     Assessment/Plan: 1 Day Post-Op   Active Problems:   Knee fracture   Blood loss anemia   Dehydration   Protein-calorie malnutrition, severe   CAP (community acquired pneumonia)   UTI (urinary tract infection)   Acute encephalopathy   Acute kidney injury   Metabolic acidosis   Left femoral shaft fracture   Anemia, iron deficiency   Advance diet Up with therapy  nwb Dry dressing PRN Continue plan per medicine   Remonia Richter 08/08/2014, 4:37 PM   [name] MD [phone]

## 2014-08-08 NOTE — Evaluation (Addendum)
Occupational Therapy Evaluation Patient Details Name: Alicia Guerra MRN: [id number] DOB: 1923/11/29 Today's Date: 08/08/2014    History of Present Illness Pt is an 80 y.o. female with PMH: arthritis, HTN, anemia with distal left femur fracture after a fall.  Now s/p ORIF Left femur on 08/07/14.   Clinical Impression   This 80 yo female admitted and underwent above presents to acute OT with decreased balance, decreased mobility, decreased cognition, decreased initiation of activity, increased pain all affecting her ability to care for herself as she reports she was pta. She will benefit from continued OT at [facility].    Follow Up Recommendations  SNF    Equipment Recommendations   (TBD at next venue)       Precautions / Restrictions Precautions Precautions: Fall Required Braces or Orthoses: Knee Immobilizer - Left Knee Immobilizer - Left: On at all times Restrictions Weight Bearing Restrictions: Yes Other Position/Activity Restrictions: NWB LLE; discussed with pt who is unable to state precautions      Mobility Bed Mobility Overal bed mobility: +2 for physical assistance;Needs Assistance Bed Mobility: Supine to Sit     Supine to sit: Total assist;+2 for physical assistance;HOB elevated     General bed mobility comments: Pt not initiating any movement even with verbal and tactile cues, total reliance on therapists to move to seated position.    Transfers Overall transfer level: Needs assistance Equipment used: Rolling walker (2 wheeled) Transfers: Sit to/from [employer] Sit to Stand: Total assist;+2 physical assistance (x1 sit-to-partial stand with RW)   Squat pivot transfers: Total assist;+2 physical assistance (x1)     General transfer comment: Pt bearing weight through RLE during transfers but otherwise no participation, even with verbal cues.  Pt able to maintain NWB LLE with total assist from therapist to keep leg off the ground.    Balance Overall  balance assessment: Needs assistance;History of Falls Sitting-balance support: Bilateral upper extremity supported;Feet supported Sitting balance-Leahy Scale: Zero Sitting balance - Comments: Pt unable to maintain seated balance without therapist supporting back to prevent LOB Postural control: Posterior lean Standing balance support: Bilateral upper extremity supported;During functional activity Standing balance-Leahy Scale: Zero Standing balance comment: Pt unable to stand without total assist +2                            ADL Overall ADL's : Needs assistance/impaired Eating/Feeding:  (I had to hold cup for her to drink from straw)   Grooming: Maximal assistance (supported sitting)   Upper Body Bathing: Maximal assistance (supported sitting)   Lower Body Bathing: Total assistance (with total A +2 sit>partial stand)   Upper Body Dressing : Total assistance (supported sitting)   Lower Body Dressing: Total assistance (with total A+2 sit>partial stand)   Toilet Transfer: Total assistance;+2 for physical assistance;Squat-pivot (bed>recliner going to pt's right using bed pad to A)       Tub/ Shower Transfer: Total assistance (with total A +2 sit>partial stand)                     Pertinent Vitals/Pain Pain Assessment: 0-10 Pain Score: 9  Pain Location: left hip; 9/10 at end of session Pain Intervention(s): Monitored during session;Repositioned     Hand Dominance Right   Extremity/Trunk Assessment Upper Extremity Assessment Upper Extremity Assessment: Generalized weakness   Lower Extremity Assessment Lower Extremity Assessment: Generalized weakness;LLE deficits/detail LLE Deficits / Details: s/p ORIF left femur; KI on at all  times; NWB   Cervical / Trunk Assessment Cervical / Trunk Assessment: Kyphotic   Communication Communication Communication: No difficulties   Cognition Arousal/Alertness: Awake/alert Behavior During Therapy: WFL for tasks  assessed/performed Overall Cognitive Status: No family/caregiver present to determine baseline cognitive functioning Area of Impairment: Orientation;Memory Orientation Level: Disoriented to;Time   Memory: Decreased short-term memory;Decreased recall of precautions                        Home Living Family/patient expects to be discharged to:: Private residence Living Arrangements: Alone Available Help at Discharge: Family (Niece) Type of Home: Apartment Home Access: Stairs to enter     Home Layout: One level               Home Equipment: Environmental consultant - 2 wheels;Shower seat;Toilet riser (Unreliable information, pt stated that she did have this equipment at start of session and claimed she didn't have them at end of session)   Additional Comments: All per pt report, however there were some inconsistencies in answers when she was asked the same question more than once      Prior Functioning/Environment Level of Independence: Independent with assistive device(s)        Comments: Question accuracy due to differing pt responses throughout session    OT Diagnosis: Generalized weakness;Cognitive deficits   OT Problem List: Decreased strength;Decreased range of motion;Decreased activity tolerance;Impaired balance (sitting and/or standing);Pain;Decreased cognition;Decreased coordination;Decreased knowledge of use of DME or AE   OT Treatment/Interventions: Self-care/ADL training;DME and/or AE instruction;Therapeutic activities;Balance training;Patient/family education    OT Goals(Current goals can be found in the care plan section) Acute Rehab OT Goals Patient Stated Goal: to go home OT Goal Formulation: With patient Time For Goal Achievement: 08/15/14 Potential to Achieve Goals: Fair ADL Goals Pt Will Perform Grooming: with min assist;sitting (EOB 2 tasks) Pt Will Transfer to Toilet: with mod assist;squat pivot transfer;bedside commode Additional ADL Goal #1: Pt will  initiate movements when asked for bed mobility and transfers Additional ADL Goal #2: Pt will be mod A for up to EOB  OT Frequency:     Barriers to D/C: Decreased caregiver support          Co-evaluation PT/OT/SLP Co-Evaluation/Treatment: Yes (partial) Reason for Co-Treatment: For patient/therapist safety PT goals addressed during session: Mobility/safety with mobility;Balance;Proper use of DME OT goals addressed during session: ADL's and self-care      End of Session Equipment Utilized During Treatment: Gait belt (attempted use of RW without success) Nurse Communication: Mobility status  Activity Tolerance: Patient limited by fatigue;Patient limited by pain Patient left: in chair;with call bell/phone within reach   Time: 0902-0911 OT Time Calculation (min): 9 min Charges:  OT General Charges $OT Visit: 1 Procedure OT Evaluation $Initial OT Evaluation Tier I: 1 Procedure  Almon Register [id number] 08/08/2014, 10:26 AM

## 2014-08-09 DIAGNOSIS — D509 Iron deficiency anemia, unspecified: Secondary | ICD-10-CM

## 2014-08-09 LAB — CBC
HCT: 25.5 % — ABNORMAL LOW (ref 36.0–46.0)
Hemoglobin: 8.1 g/dL — ABNORMAL LOW (ref 12.0–15.0)
MCH: 23.2 pg — ABNORMAL LOW (ref 26.0–34.0)
MCHC: 31.8 g/dL (ref 30.0–36.0)
MCV: 73.1 fL — ABNORMAL LOW (ref 78.0–100.0)
PLATELETS: 295 10*3/uL (ref 150–400)
RBC: 3.49 MIL/uL — ABNORMAL LOW (ref 3.87–5.11)
RDW: 22.7 % — AB (ref 11.5–15.5)
WBC: 11.2 10*3/uL — AB (ref 4.0–10.5)

## 2014-08-09 LAB — TYPE AND SCREEN
ABO/RH(D): O POS
Antibody Screen: NEGATIVE
Unit division: 0
Unit division: 0

## 2014-08-09 LAB — URINE CULTURE

## 2014-08-09 LAB — BASIC METABOLIC PANEL
ANION GAP: 17 — AB (ref 5–15)
BUN: 37 mg/dL — ABNORMAL HIGH (ref 6–23)
CHLORIDE: 98 meq/L (ref 96–112)
CO2: 18 mEq/L — ABNORMAL LOW (ref 19–32)
Calcium: 8.2 mg/dL — ABNORMAL LOW (ref 8.4–10.5)
Creatinine, Ser: 3.39 mg/dL — ABNORMAL HIGH (ref 0.50–1.10)
GFR calc non Af Amer: 11 mL/min — ABNORMAL LOW (ref >90)
GFR, EST AFRICAN AMERICAN: 13 mL/min — AB (ref >90)
Glucose, Bld: 178 mg/dL — ABNORMAL HIGH (ref 70–99)
POTASSIUM: 4.6 meq/L (ref 3.7–5.3)
SODIUM: 133 meq/L — AB (ref 137–147)

## 2014-08-09 LAB — URIC ACID: Uric Acid, Serum: 7.7 mg/dL — ABNORMAL HIGH (ref 2.4–7.0)

## 2014-08-09 MED ORDER — ONDANSETRON HCL 4 MG/2ML IJ SOLN
4.0000 mg | Freq: Four times a day (QID) | INTRAMUSCULAR | Status: DC | PRN
  Administered 2014-08-09 – 2014-08-11 (×2): 4 mg via INTRAVENOUS
  Filled 2014-08-09 (×2): qty 2

## 2014-08-09 MED ORDER — SODIUM CHLORIDE 0.9 % IV SOLN
INTRAVENOUS | Status: DC
  Administered 2014-08-09 – 2014-08-12 (×5): via INTRAVENOUS

## 2014-08-09 MED ORDER — SODIUM CHLORIDE 0.9 % IV BOLUS (SEPSIS)
500.0000 mL | Freq: Once | INTRAVENOUS | Status: AC
  Administered 2014-08-09: 500 mL via INTRAVENOUS

## 2014-08-09 NOTE — Progress Notes (Addendum)
[facility] PROGRESS NOTE  Alicia Guerra [id number] DOB: 1924-07-15 DOA: 08/05/2014 PCP: [name], MD  Brief narrative 80 year old female being independently prior to admission presented to [facility] ED after sustaining a fall when she slid down the steps and hit her left knee. Extraocular left knee none in the ED showed comminuted fracture of the distal femoral diaphysis with angulation and displacement at thie the fracture site. Patient transferred to [name] for orthopedic evaluation. Patient was also found to have a hemoglobin of 5.1 with guaiac-positive stools.  On day 2 of admission patient febrile, encephalopathic with increased leukocytosis and right lung pneumonia. Also has UTI.   Assessment/Plan:  Acute versus acute on Chronic kidney disease No old labs in the system. FENa<1. Renal function worsening with creatinine of 3.39 today. US unremarkable. Monitor strict I/O. avoid nephrotoxins. Will ask renal consult to evaluate.   SIRS with acute encephalopathy Resolved. Secondary to community acquired versus aspiration pneumonia. encephalopathy resolved.  UA suggestive of UTI. Urine culture growing 50k ecoli.   blood cultures Negative. On empiric Rocephin and azithromycin. Seen by swallow eval and recommends dysphagia level III diet and will reassess while in the hospital.   Left knee and displaced distal femoral metaphysis fracture -s/p ORIF on 12/3 . tolerated well. PT evaluation recommends SNF. Marland Kitchen Pain control with when necessary narcotics.  On low dose atenolol for perioperative beta blockade.  Microcytic anemia iron panel suggests iron deficiency. Patient guiac positive in the ED. contacted PCP office but seems to be closed until 12/8. Hemoglobin has improved with 2 u PRBC on admission. Slight drop  likely post op B12 is normal. Needs outpt GI referral if H&h remains stable    Severe Protein Calorie malnutrition Nutrition consult  appreciated. Added ensure twice a day  DVT prophylaxis: sq heparin and full dose ASA  Code Status: Full code  Family Communication: None at bedside today Disposition Plan: needs SNF . Possibly on 12/7   Consultants:  ortho  Procedures:  ORIF left femur fracture on 12/3  Antibiotics:  rocephin and azithromycin 12/2--  HPI/Subjective: Pt seen and examined this am.   Objective: Filed Vitals:   08/09/14 1151  BP:   Pulse:   Temp:   Resp: 18    Intake/Output Summary (Last 24 hours) at 08/09/14 1300 Last data filed at 08/09/14 1100  Gross per 24 hour  Intake    240 ml  Output    200 ml  Net     40 ml   Filed Weights   08/05/14 0824  Weight: 54.432 kg (120 lb)    Exam:   General: Elderly thin built female lying in bed in no acute distress  HEENT: Hoarse voice, pallor present, moist oral mucosa  Chest: clear Breath sounds bilaterally  CVS: Normal S1 and S2, no murmurs  Abdomen: Soft, nondistended, nontender, bowel sounds present,   Extremities: Warm, left knee brace  CNS: AAOX 2  Data Reviewed: Basic Metabolic Panel:  Recent Labs Lab 08/05/14 0940 08/06/14 0535 08/07/14 0324 08/08/14 0300 08/09/14 0442  NA 143 140 135* 135* 133*  K 4.4 4.9 5.3 4.9 4.6  CL 107 107 101 102 98  CO2 21 18* 17* 14* 18*  GLUCOSE 131* 160* 160* 108* 178*  BUN 25* 24* 24* 29* 37*  CREATININE 1.66* 1.73* 1.77* 2.01* 3.39*  CALCIUM 8.9 8.6 9.1 8.4 8.2*  MG  --   --   --  2.1  --    Liver Function Tests:  Recent Labs Lab 08/05/14 0940  AST 15  ALT 7  ALKPHOS 77  BILITOT 0.2*  PROT 6.9  ALBUMIN 3.6   No results for input(s): LIPASE, AMYLASE in the last 168 hours. No results for input(s): AMMONIA in the last 168 hours. CBC:  Recent Labs Lab 08/05/14 0940 08/06/14 0535 08/07/14 0324 08/08/14 0300 08/09/14 0442  WBC 8.5 14.4* 18.2* 12.8* 11.2*  NEUTROABS 7.1  --   --   --   --   HGB 5.1* 8.9* 9.5* 8.4* 8.1*  HCT 17.0* 28.5* 30.0* 26.4* 25.5*   MCV 66.1* 72.0* 72.5* 72.9* 73.1*  PLT 486* 412* 422* 341 295   Cardiac Enzymes: No results for input(s): CKTOTAL, CKMB, CKMBINDEX, TROPONINI in the last 168 hours. BNP (last 3 results) No results for input(s): PROBNP in the last 8760 hours. CBG: No results for input(s): GLUCAP in the last 168 hours.  Recent Results (from the past 240 hour(s))  Surgical pcr screen     Status: None   Collection Time: 08/06/14  2:00 PM  Result Value Ref Range Status   MRSA, PCR NEGATIVE NEGATIVE Final   Staphylococcus aureus NEGATIVE NEGATIVE Final    Comment:        The Xpert SA Assay (FDA approved for NASAL specimens in patients over 11 years of age), is one component of a comprehensive surveillance program.  Test performance has been validated by [employer] for patients greater than or equal to 65 year old. It is not intended to diagnose infection nor to guide or monitor treatment.   Culture, blood (routine x 2)     Status: None (Preliminary result)   Collection Time: 08/06/14  4:10 PM  Result Value Ref Range Status   Specimen Description BLOOD RIGHT HAND  Final   Special Requests BOTTLES DRAWN AEROBIC ONLY 3 CC  Final   Culture  Setup Time   Final    08/06/2014 21:17 Performed at [facility]    Culture   Final           BLOOD CULTURE RECEIVED NO GROWTH TO DATE CULTURE WILL BE HELD FOR 5 DAYS BEFORE ISSUING A FINAL NEGATIVE REPORT Performed at [facility]    Report Status PENDING  Incomplete  Culture, blood (routine x 2)     Status: None (Preliminary result)   Collection Time: 08/06/14  4:20 PM  Result Value Ref Range Status   Specimen Description BLOOD LEFT HAND  Final   Special Requests BOTTLES DRAWN AEROBIC ONLY 6 CC  Final   Culture  Setup Time   Final    08/06/2014 21:17 Performed at [facility]    Culture   Final           BLOOD CULTURE RECEIVED NO GROWTH TO DATE CULTURE WILL BE HELD FOR 5 DAYS BEFORE ISSUING A FINAL NEGATIVE REPORT Performed  at [facility]    Report Status PENDING  Incomplete  Culture, Urine     Status: None (Preliminary result)   Collection Time: 08/07/14 12:01 PM  Result Value Ref Range Status   Specimen Description URINE, CATHETERIZED  Final   Special Requests NONE  Final   Culture  Setup Time   Final    08/07/2014 16:55 Performed at [place]   Final    55,000 COLONIES/ML Performed at [facility]    Culture   Final    ESCHERICHIA COLI Performed at [facility]  Report Status PENDING  Incomplete     Studies: Dg Knee 1-2 Views Left  18-Aug-2014   CLINICAL DATA:  ORIF distal left femur  EXAM: DG C-ARM 61-120 MIN; LEFT KNEE - 1-2 VIEW  FLUOROSCOPY TIME:  30 seconds  COMPARISON:  None  FINDINGS: Comminuted distal femoral diametaphyseal fracture transfixed by a lateral sideplate with multiple interlocking screws. The fracture is near anatomic alignment. There are postsurgical changes in the surrounding soft tissues.  There is peripheral vascular atherosclerotic disease.  IMPRESSION: ORIF distal left femoral fracture.   Electronically Signed   By: Kathreen Devoid   On: 08-18-2014 17:59   US Renal  08/08/2014   CLINICAL DATA:  Acute kidney injury.  EXAM: RENAL/URINARY TRACT ULTRASOUND COMPLETE  COMPARISON:  None.  FINDINGS: Right Kidney:  Length: 7.8 cm. Parenchymal echogenicity is increased. An anechoic or nearly anechoic lesion off the upper pole measures 1.1 x 1.3 x 0.9 cm, most consistent with a cyst. No hydronephrosis.  Left Kidney:  Length: 8.8 cm. Parenchymal echogenicity is increased. An anechoic or nearly anechoic lesion in the region of the junction of the mid and lower poles measures 1.1 x 1.1 x 0.8 cm, also most consistent with a cyst. No hydronephrosis.  Bladder:  Foley catheter is seen in a decompressed bladder.  IMPRESSION: 1. No acute findings. 2. Increased renal parenchymal echogenicity is indicative of chronic medical renal disease.    Electronically Signed   By: Lorin Picket M.D.   On: 08/08/2014 14:13   Dg Knee Left Port  08/08/2014   CLINICAL DATA:  Internal fixation of femur fracture.  EXAM: PORTABLE LEFT KNEE - 1-2 VIEW  COMPARISON:  08/05/2014  FINDINGS: Plate and screw fixation traversing a distal femoral fracture is noted, in near-anatomic alignment and position.  No complicating features are identified.  IMPRESSION: Internal fixation of distal femur fracture, in near-anatomic alignment and position.   Electronically Signed   By: Hassan Rowan M.D.   On: 08/08/2014 02:47   Dg C-arm 1-60 Min  2014-08-18   CLINICAL DATA:  ORIF distal left femur  EXAM: DG C-ARM 61-120 MIN; LEFT KNEE - 1-2 VIEW  FLUOROSCOPY TIME:  30 seconds  COMPARISON:  None  FINDINGS: Comminuted distal femoral diametaphyseal fracture transfixed by a lateral sideplate with multiple interlocking screws. The fracture is near anatomic alignment. There are postsurgical changes in the surrounding soft tissues.  There is peripheral vascular atherosclerotic disease.  IMPRESSION: ORIF distal left femoral fracture.   Electronically Signed   By: Kathreen Devoid   On: 08-18-2014 17:59    Scheduled Meds: . sodium chloride   Intravenous Once  . aspirin EC  325 mg Oral Q breakfast  . azithromycin  500 mg Oral Daily  . cefTRIAXone (ROCEPHIN)  IV  1 g Intravenous Q24H  . feeding supplement (ENSURE COMPLETE)  237 mL Oral BID BM  . heparin subcutaneous  5,000 Units Subcutaneous 3 times per day  . sodium chloride  500 mL Intravenous Once  . sodium chloride  3 mL Intravenous Q12H   Continuous Infusions: . sodium chloride        Time spent: 25 minutes    Isaah Furry, [place]  [facility] Pager [phone]. If 7PM-7AM, please contact night-coverage at [email or web address], password [facility] 08/09/2014, 1:00 PM  LOS: 4 days

## 2014-08-09 NOTE — Plan of Care (Signed)
Problem: Phase I Progression Outcomes Goal: Pain controlled with appropriate interventions Outcome: Completed/Met Date Met:  08/09/14 Goal: Incision/dressings dry and intact Outcome: Completed/Met Date Met:  08/09/14 Goal: Voiding-avoid urinary catheter unless indicated Outcome: Completed/Met Date Met:  08/09/14 Goal: Vital signs/hemodynamically stable Outcome: Completed/Met Date Met:  08/09/14

## 2014-08-09 NOTE — Progress Notes (Addendum)
Foley insertion attempted and unsuccessful. Bladder scan performed as order and O mL resulted.  Dr Jonnie Finner made aware. New orders given. Will continue to monitor.

## 2014-08-09 NOTE — Consult Note (Signed)
Renal Service Consult Note [facility]  Alicia Guerra 08/09/2014 Sol Blazing Requesting Physician: Dr Clementeen Graham  Reason for Consult:  Renal failure HPI: The patient is a 80 y.o. year-old AAF with hx of HTN, DJD, anemia brought to ED by EMS on 12/1 , found at bottom of steps at home.  Out of doors > 1 hour, deformity to L knee.  BP up in ED. Pt noted she slipped on leaves and fell. Admitted with dx of left femur fracture and underwent ORIF on 12/3 by orthopedics. Creatinine on admit was 1.66 and has gone up to 2.01 yest and 3.39 today.  Patient is poor historian, denies hx of renal disease.   BP's were high on admission 182/61 - 176/58, but the last two days have been low , now 90/47. Is on atenolol.   Meds in hospital: ASA, atenolol, sq heparin, dilaudid, tylenol, vicodin, oxycodone ZMax D #4 Rocephin D #4  I/O total 3.4L in and 2.3 54 kg on 12/1  Chart review: No prior admissions in system    ROS  poor historian  denies active, CP, SOB, n/v/d  no abd pain  no difficulty voiding  Past Medical History  Past Medical History  Diagnosis Date  . Hypertension   . Arthritis   . Anemia   . History of blood transfusion     "today is the first time" (08/05/2014)   Past Surgical History  Past Surgical History  Procedure Laterality Date  . Cataract extraction Left   . Tonsillectomy    . Appendectomy    . Cholecystectomy    . Cystectomy      "had cyst taken off lower back"  . Orif femur fracture Left 08/07/2014    Procedure: OPEN REDUCTION INTERNAL FIXATION (ORIF)  LEFT FEMUR FRACTURE;  Surgeon: [name], MD;  Location: [place];  Service: Orthopedics;  Laterality: Left;   Family History History reviewed. No pertinent family history. Social History  reports that she has quit smoking. She has never used smokeless tobacco. She reports that she does not drink alcohol or use illicit drugs. Allergies No Known Allergies Home medications Prior to  Admission medications   Not on File   Liver Function Tests  Recent Labs Lab 08/05/14 0940  AST 15  ALT 7  ALKPHOS 77  BILITOT 0.2*  PROT 6.9  ALBUMIN 3.6   No results for input(s): LIPASE, AMYLASE in the last 168 hours. CBC  Recent Labs Lab 08/05/14 0940  08/07/14 0324 08/08/14 0300 08/09/14 0442  WBC 8.5  < > 18.2* 12.8* 11.2*  NEUTROABS 7.1  --   --   --   --   HGB 5.1*  < > 9.5* 8.4* 8.1*  HCT 17.0*  < > 30.0* 26.4* 25.5*  MCV 66.1*  < > 72.5* 72.9* 73.1*  PLT 486*  < > 422* 341 295  < > = values in this interval not displayed. Basic Metabolic Panel  Recent Labs Lab 08/05/14 0940 08/06/14 0535 08/07/14 0324 08/08/14 0300 08/09/14 0442  NA 143 140 135* 135* 133*  K 4.4 4.9 5.3 4.9 4.6  CL 107 107 101 102 98  CO2 21 18* 17* 14* 18*  GLUCOSE 131* 160* 160* 108* 178*  BUN 25* 24* 24* 29* 37*  CREATININE 1.66* 1.73* 1.77* 2.01* 3.39*  CALCIUM 8.9 8.6 9.1 8.4 8.2*    Filed Vitals:   08/09/14 0400 08/09/14 0433 08/09/14 0800 08/09/14 1133  BP:  110/43  90/47  Pulse:  81    Temp:  98.4 F (36.9 C)    TempSrc:  Oral    Resp: 18 20 18    Height:      Weight:      SpO2: 95%      Exam Pleasant elderly AAF lying flat, no distress No rash, cyanosis or gangrene Sclera anicteric, throat clear No jvd Chest clear bilat RRR no MRG, occ premature beats Abd soft, NTND, +BS, no ascites or HSM LLE in brace, trace dependent edema RLE trace dependent edema Neuro mild- mod gen weakness, +mild myoclonic jerking of UE's at rest Not sure of where she is, "[place]?", "nursing home?" Knows day "Saturday", does not know date or year Knows why she came , "I fell" and how long she's been here  UA 12/2  11-20 wbc, TNTC rbc, few bact, 1.013, pH 6.5  Prot 100 UNa 93, Ucreat 124,  FeNa 1.11% CXR 12/3 new R perhilar airspace disease, all lobes of R lung, suspect PNA Renal US  7.8 / 8.8 cm kidneys, echogenic, no hydro, +renal cysts Alb 3.6, BUN 37, Cr  3.39  Assessment: 1. Acute on CRF - no old data, creat 1.7 on admit and up to 3.3.  BP's are low last 48 hours, likely cause. Doesn't look septic, may be due to atenolol.  Will stop atenolol and give more IVF's.  Cause of CKD unknown but she has significant underlying disease with echogenic small kidneys on [place].  Suspect baseline is stage 4.  She has some myoclonic jerking now which may be early uremic symptom.  She is not a good candidate for RRT given her comorbidities.  Will do urine sediment and a few blood and urine tests.  Renal prognosis overall is poor, though AKI may recover with improved BP and ^'d volume.  Will need palliative care involvement if renal function deteriorates further.  2. Hx HTN - no meds listed on home med list 3. Anemia - Hb 5 > 8 after transfusion   Plan- see orders  Kelly Splinter MD (pgr) [phone]    [phone] 08/09/2014, 11:42 AM

## 2014-08-09 NOTE — Progress Notes (Addendum)
Clinical Social Work Department CLINICAL SOCIAL WORK PLACEMENT NOTE 08/09/2014  Patient:  Alicia Guerra, Alicia Guerra  Account Number:  [id number] Admit date:  08/05/2014  Clinical Social Worker:  Sueanne Margarita, LCSW  Date/time:  08/09/2014 05:30 PM  Clinical Social Work is seeking post-discharge placement for this patient at the following level of care:   SKILLED NURSING   (*CSW will update this form in Epic as items are completed)   08/08/2014  Patient/family provided with [place] Department of Clinical Social Work's list of facilities offering this level of care within the geographic area requested by the patient (or if unable, by the patient's family).  08/08/2014  Patient/family informed of their freedom to choose among providers that offer the needed level of care, that participate in Medicare, Medicaid or managed care program needed by the patient, have an available bed and are willing to accept the patient.  08/08/2014  Patient/family informed of [facility]' ownership interest in [facility], as well as of the fact that they are under no obligation to receive care at this facility.  PASARR submitted to EDS on 08/09/2014 PASARR number received on 08/09/2014  FL2 transmitted to all facilities in geographic area requested by pt/family on  08/09/2014 FL2 transmitted to all facilities within larger geographic area on   Patient informed that his/her managed care company has contracts with or will negotiate with  certain facilities, including the following:     Patient/family informed of bed offers received: 08/12/14 Evette Cristal, MSW, [place], updated 08/15/14)   Patient chooses bed at [facility] (Evette Cristal, MSW, [place], updated 08/15/14) Physician recommends and patient chooses bed at     Patient to be transferred to [facility]  on 08/15/14 Evette Cristal, MSW, [place], updated 08/15/14)  Patient to be transferred to facility by [facility] Evette Cristal,  MSW, [place], updated 08/15/14) Patient and family notified of transfer on 08/15/2014 Evette Cristal, MSW, [place], updated 08/15/14) Name of family member notified:  [name] Evette Cristal, MSW, [place], updated 08/15/14)  The following physician request were entered in Epic:   Additional Comments:   Jones Broom. Norval Morton, MSW, [place] 08/15/2014 2:32 PM  Evette Cristal, MSW, [place], updated 08/15/14)

## 2014-08-09 NOTE — Progress Notes (Signed)
Subjective: 2 Days Post-Op Procedure(s) (LRB): OPEN REDUCTION INTERNAL FIXATION (ORIF)  LEFT FEMUR FRACTURE (Left) Patient reports pain as moderate.    Objective: Vital signs in last 24 hours: Temp:  [98.4 F (36.9 C)-99.4 F (37.4 C)] 98.4 F (36.9 C) (12/05 0433) Pulse Rate:  [75-88] 81 (12/05 0433) Resp:  [16-20] 18 (12/05 1151) BP: (90-117)/(43-54) 90/47 mmHg (12/05 1133) SpO2:  [95 %-97 %] 95 % (12/05 1151)  Intake/Output from previous day: 12/04 0701 - 12/05 0700 In: -  Out: 200 [Urine:200] Intake/Output this shift: Total I/O In: 240 [P.O.:190; Other:50] Out: -    Recent Labs  08/07/14 0324 08/08/14 0300 08/09/14 0442  HGB 9.5* 8.4* 8.1*    Recent Labs  08/08/14 0300 08/09/14 0442  WBC 12.8* 11.2*  RBC 3.62* 3.49*  HCT 26.4* 25.5*  PLT 341 295    Recent Labs  08/08/14 0300 08/09/14 0442  NA 135* 133*  K 4.9 4.6  CL 102 98  CO2 14* 18*  BUN 29* 37*  CREATININE 2.01* 3.39*  GLUCOSE 108* 178*  CALCIUM 8.4 8.2*   No results for input(s): LABPT, INR in the last 72 hours.  Neurovascular intact Sensation intact distally Intact pulses distally Incision: dressing C/D/I  Assessment/Plan: 2 Days Post-Op Procedure(s) (LRB): OPEN REDUCTION INTERNAL FIXATION (ORIF)  LEFT FEMUR FRACTURE (Left)  Active Problems:  Knee fracture  Blood loss anemia  Dehydration  Protein-calorie malnutrition, severe  CAP (community acquired pneumonia)  UTI (urinary tract infection)  Acute encephalopathy  Acute kidney injury  Metabolic acidosis  Left femoral shaft fracture  Anemia, iron deficiency   Advance diet Up with therapy  nwb Dry dressing PRN Continue plan per medicine  Chriss Czar 08/09/2014, 12:36 PM

## 2014-08-10 DIAGNOSIS — S72302S Unspecified fracture of shaft of left femur, sequela: Secondary | ICD-10-CM

## 2014-08-10 LAB — CBC
HCT: 24.2 % — ABNORMAL LOW (ref 36.0–46.0)
Hemoglobin: 7.7 g/dL — ABNORMAL LOW (ref 12.0–15.0)
MCH: 24 pg — AB (ref 26.0–34.0)
MCHC: 31.8 g/dL (ref 30.0–36.0)
MCV: 75.4 fL — ABNORMAL LOW (ref 78.0–100.0)
Platelets: 291 10*3/uL (ref 150–400)
RBC: 3.21 MIL/uL — ABNORMAL LOW (ref 3.87–5.11)
RDW: 23.1 % — ABNORMAL HIGH (ref 11.5–15.5)
WBC: 9.5 10*3/uL (ref 4.0–10.5)

## 2014-08-10 LAB — BASIC METABOLIC PANEL
Anion gap: 18 — ABNORMAL HIGH (ref 5–15)
BUN: 51 mg/dL — ABNORMAL HIGH (ref 6–23)
CALCIUM: 7.9 mg/dL — AB (ref 8.4–10.5)
CO2: 18 mEq/L — ABNORMAL LOW (ref 19–32)
Chloride: 101 mEq/L (ref 96–112)
Creatinine, Ser: 4.09 mg/dL — ABNORMAL HIGH (ref 0.50–1.10)
GFR calc non Af Amer: 9 mL/min — ABNORMAL LOW (ref >90)
GFR, EST AFRICAN AMERICAN: 10 mL/min — AB (ref >90)
Glucose, Bld: 114 mg/dL — ABNORMAL HIGH (ref 70–99)
POTASSIUM: 4.2 meq/L (ref 3.7–5.3)
SODIUM: 137 meq/L (ref 137–147)

## 2014-08-10 NOTE — Consult Note (Signed)
Reason for Consult: Oliguric Renal Failure, Difficult Foley with Need for Accurate UOP Monitoring  Referring Physician: Cathlean Sauer MD  Alicia Guerra is an 80 y.o. female.   HPI:   1 - Oliguric Renal Failure - pt with Cr on admission of 1.6 then acute rise to now over 4 with scant UOP. Renal US w/o hydro, but likely medical renal disease (hyperechoic)  2 - Difficult Foley  - NSG with attempt foley x2 but without obvious output, concerned for proper placement. Exam with very small introitus (small frame lady) with minimal abiliyt to externally rotate left hip due to recent surgery.  PMH sig for hip fracture, back surgery. No prior GU surgery per pt.  Today Alicia Guerra is seen in consultation for above, specifically for difficult foley placement.   Past Medical History  Diagnosis Date  . Hypertension   . Arthritis   . Anemia   . History of blood transfusion     "today is the first time" (08/05/2014)    Past Surgical History  Procedure Laterality Date  . Cataract extraction Left   . Tonsillectomy    . Appendectomy    . Cholecystectomy    . Cystectomy      "had cyst taken off lower back"  . Orif femur fracture Left 08/07/2014    Procedure: OPEN REDUCTION INTERNAL FIXATION (ORIF)  LEFT FEMUR FRACTURE;  Surgeon: [name], MD;  Location: [place];  Service: Orthopedics;  Laterality: Left;    History reviewed. No pertinent family history.  Social History:  reports that she has quit smoking. She has never used smokeless tobacco. She reports that she does not drink alcohol or use illicit drugs.  Allergies: No Known Allergies  Medications: I have reviewed the patient's current medications.  Results for orders placed or performed during the hospital encounter of 08/05/14 (from the past 48 hour(s))  CBC     Status: Abnormal   Collection Time: 08/09/14  4:42 AM  Result Value Ref Range   WBC 11.2 (H) 4.0 - 10.5 K/uL   RBC 3.49 (L) 3.87 - 5.11 MIL/uL   Hemoglobin 8.1 (L) 12.0 -  15.0 g/dL   HCT 25.5 (L) 36.0 - 46.0 %   MCV 73.1 (L) 78.0 - 100.0 fL   MCH 23.2 (L) 26.0 - 34.0 pg   MCHC 31.8 30.0 - 36.0 g/dL   RDW 22.7 (H) 11.5 - 15.5 %   Platelets 295 150 - 400 K/uL  Basic metabolic panel     Status: Abnormal   Collection Time: 08/09/14  4:42 AM  Result Value Ref Range   Sodium 133 (L) 137 - 147 mEq/L   Potassium 4.6 3.7 - 5.3 mEq/L   Chloride 98 96 - 112 mEq/L   CO2 18 (L) 19 - 32 mEq/L   Glucose, Bld 178 (H) 70 - 99 mg/dL   BUN 37 (H) 6 - 23 mg/dL   Creatinine, Ser 3.39 (H) 0.50 - 1.10 mg/dL    Comment: DELTA CHECK NOTED   Calcium 8.2 (L) 8.4 - 10.5 mg/dL   GFR calc non Af Amer 11 (L) >90 mL/min   GFR calc Af Amer 13 (L) >90 mL/min    Comment: (NOTE) The eGFR has been calculated using the CKD EPI equation. This calculation has not been validated in all clinical situations. eGFR's persistently <90 mL/min signify possible Chronic Kidney Disease.    Anion gap 17 (H) 5 - 15  Uric acid     Status: Abnormal  Collection Time: 08/09/14  4:42 AM  Result Value Ref Range   Uric Acid, Serum 7.7 (H) 2.4 - 7.0 mg/dL  CBC     Status: Abnormal   Collection Time: 08/10/14  5:30 AM  Result Value Ref Range   WBC 9.5 4.0 - 10.5 K/uL   RBC 3.21 (L) 3.87 - 5.11 MIL/uL   Hemoglobin 7.7 (L) 12.0 - 15.0 g/dL   HCT 24.2 (L) 36.0 - 46.0 %   MCV 75.4 (L) 78.0 - 100.0 fL   MCH 24.0 (L) 26.0 - 34.0 pg   MCHC 31.8 30.0 - 36.0 g/dL   RDW 23.1 (H) 11.5 - 15.5 %   Platelets 291 150 - 400 K/uL  Basic metabolic panel     Status: Abnormal   Collection Time: 08/10/14  5:30 AM  Result Value Ref Range   Sodium 137 137 - 147 mEq/L   Potassium 4.2 3.7 - 5.3 mEq/L   Chloride 101 96 - 112 mEq/L   CO2 18 (L) 19 - 32 mEq/L   Glucose, Bld 114 (H) 70 - 99 mg/dL   BUN 51 (H) 6 - 23 mg/dL   Creatinine, Ser 4.09 (H) 0.50 - 1.10 mg/dL   Calcium 7.9 (L) 8.4 - 10.5 mg/dL   GFR calc non Af Amer 9 (L) >90 mL/min   GFR calc Af Amer 10 (L) >90 mL/min    Comment: (NOTE) The eGFR has been  calculated using the CKD EPI equation. This calculation has not been validated in all clinical situations. eGFR's persistently <90 mL/min signify possible Chronic Kidney Disease.    Anion gap 18 (H) 5 - 15    US Renal  08/08/2014   CLINICAL DATA:  Acute kidney injury.  EXAM: RENAL/URINARY TRACT ULTRASOUND COMPLETE  COMPARISON:  None.  FINDINGS: Right Kidney:  Length: 7.8 cm. Parenchymal echogenicity is increased. An anechoic or nearly anechoic lesion off the upper pole measures 1.1 x 1.3 x 0.9 cm, most consistent with a cyst. No hydronephrosis.  Left Kidney:  Length: 8.8 cm. Parenchymal echogenicity is increased. An anechoic or nearly anechoic lesion in the region of the junction of the mid and lower poles measures 1.1 x 1.1 x 0.8 cm, also most consistent with a cyst. No hydronephrosis.  Bladder:  Foley catheter is seen in a decompressed bladder.  IMPRESSION: 1. No acute findings. 2. Increased renal parenchymal echogenicity is indicative of chronic medical renal disease.   Electronically Signed   By: Lorin Picket M.D.   On: 08/08/2014 14:13    Review of Systems  Constitutional: Positive for malaise/fatigue.  HENT: Negative.   Eyes: Negative.   Respiratory: Positive for cough, hemoptysis and sputum production.   Cardiovascular: Negative.   Gastrointestinal: Negative for nausea and vomiting.  Genitourinary: Negative.  Negative for urgency, frequency, hematuria and flank pain.  Musculoskeletal: Negative.   Skin: Negative.   Neurological: Negative.  Negative for tremors.  Endo/Heme/Allergies: Negative.   Psychiatric/Behavioral: Negative.    Blood pressure 106/59, pulse 77, temperature 98.1 F (36.7 C), temperature source Oral, resp. rate 18, height 5' (1.524 m), weight 42.9 kg (94 lb 9.2 oz), SpO2 97 %. Physical Exam  Constitutional: She appears well-developed.  Frail, but mentally vigorous for age. Niece at bedside.   HENT:  Head: Normocephalic.  Eyes: Pupils are equal, round, and  reactive to light.  Neck: Normal range of motion.  Cardiovascular: Normal rate.   Respiratory: Effort normal.  Non-productive wet cough  GI: Soft.  Genitourinary: Vagina normal.  Post-menaupasal  atrophy as expected  Musculoskeletal:  LLE in splint.   Neurological: She is alert.  Skin: Skin is warm and dry.  Psychiatric: She has a normal mood and affect.    PROCEDURE:  Pt positioned most optimally with Rt  knee flexion / ext rotation, minimal left hip ext rotation / no knee flexion. Urethra visualized, cleaned aseptically with betadine and cannulated with 81F straight catheter with immediate efflux 60cc non-concentrated appearing urine. 10cc water placed in balloon.   Assessment/Plan:   1 - Oliguric Renal Failure - Agree with nephrology opinion likely medical renal in origin and poor dialysis candidate. Little concern for upper or lower tract obstruction. Catheter now placed for most accurate UOP monitoring.    2 - Difficult Foley  - due to habitus and recent surgery, successfully placed as per above. May DC at any point when not needed for UOP monitoring.  3- Call with questions.   Jonita Hirota 08/10/2014, 2:01 PM

## 2014-08-10 NOTE — Plan of Care (Signed)
Problem: Phase II Progression Outcomes Goal: Pain controlled Outcome: Completed/Met Date Met:  08/10/14 Goal: Vital signs stable Outcome: Completed/Met Date Met:  08/10/14 Goal: Dressings dry/intact Outcome: Completed/Met Date Met:  08/10/14

## 2014-08-10 NOTE — Progress Notes (Signed)
[facility] PROGRESS NOTE  Alicia Guerra [id number] DOB: 01-13-1924 DOA: 08/05/2014 PCP: [name], MD   brief narrative 80 year old female being independently prior to admission presented to [facility] ED after sustaining a fall when she slid down the steps and hit her left knee. Extraocular left knee none in the ED showed comminuted fracture of the distal femoral diaphysis with angulation and displacement at thie the fracture site. Patient transferred to [name] for orthopedic evaluation. Patient was also found to have a hemoglobin of 5.1 with guaiac-positive stools.  On day 2 of admission patient febrile, encephalopathic with increased leukocytosis and right lung pneumonia. Also has UTI. tolerated ORIF on 12/3. hospital course now complicated due to advanced renal failure.   Assessment/Plan: Acute versus acute on Chronic kidney disease No old labs in the system. FENa<1. Renal function progressively  worsening . Small kidneys on renal US.    Monitor strict I/O. avoid nephrotoxins.  Check bladder scan and place foley  (Failed attempt yesterday). poor HD candidate. palliative team consulted to discuss [place] with family.   SIRS with acute encephalopathy Secondary to community acquired versus aspiration pneumonia. now has mild uremic sx.  UA suggestive of UTI. Urine culture growing 50k ecoli. blood cultures Negative. On empiric Rocephin and azithromycin since admission. dced today. ( completed 5 days) Seen by swallow eval and recommends dysphagia level III diet and will reassess while in the hospital.   Left knee and displaced distal femoral metaphysis fracture -s/p ORIF on 12/3 . tolerated well. PT evaluation recommends SNF. Marland Kitchen Pain control with when necessary narcotics.    Microcytic anemia iron panel suggests iron deficiency. Patient guiac positive in the ED. contacted PCP office but seems to be closed until 12/8. Hemoglobin  improved with 2 u PRBC on  admission.    Severe Protein Calorie malnutrition Nutrition consult appreciated. Added ensure twice a day  DVT prophylaxis: sq heparin and full dose ASA  Code Status: Full code  Family Communication: spoke with niece on the phone Disposition Plan:pending  palliative consult for [place].    Consultants:  ortho  Procedures:  ORIF left femur fracture on 12/3  Antibiotics:  rocephin and azithromycin 12/2--  HPI/Subjective: Pt seen and examined this am. Appears more confused today. Denies any pain  Objective: Filed Vitals:   08/10/14 0643  BP: 106/59  Pulse: 77  Temp: 98.1 F (36.7 C)  Resp: 18    Intake/Output Summary (Last 24 hours) at 08/10/14 1026 Last data filed at 08/10/14 0900  Gross per 24 hour  Intake    221 ml  Output      0 ml  Net    221 ml   Filed Weights   08/05/14 0824 08/10/14 0259  Weight: 54.432 kg (120 lb) 42.9 kg (94 lb 9.2 oz)    Exam:   General:  in no acute distress  HEENT: Hoarse voice, moist oral mucosa  Chest: clear Breath sounds bilaterally  CVS: Normal S1 and S2, no murmurs  Abdomen: Soft, nondistended, nontender, bowel sounds present,   Extremities: Warm, left knee brace  CNS: AAOX 2  Data Reviewed: Basic Metabolic Panel:  Recent Labs Lab 08/06/14 0535 08/07/14 0324 08/08/14 0300 08/09/14 0442 08/10/14 0530  NA 140 135* 135* 133* 137  K 4.9 5.3 4.9 4.6 4.2  CL 107 101 102 98 101  CO2 18* 17* 14* 18* 18*  GLUCOSE 160* 160* 108* 178* 114*  BUN 24* 24* 29* 37* 51*  CREATININE 1.73* 1.77* 2.01*  3.39* 4.09*  CALCIUM 8.6 9.1 8.4 8.2* 7.9*  MG  --   --  2.1  --   --    Liver Function Tests:  Recent Labs Lab 08/05/14 0940  AST 15  ALT 7  ALKPHOS 77  BILITOT 0.2*  PROT 6.9  ALBUMIN 3.6   No results for input(s): LIPASE, AMYLASE in the last 168 hours. No results for input(s): AMMONIA in the last 168 hours. CBC:  Recent Labs Lab 08/05/14 0940 08/06/14 0535 08/07/14 0324 08/08/14 0300  08/09/14 0442 08/10/14 0530  WBC 8.5 14.4* 18.2* 12.8* 11.2* 9.5  NEUTROABS 7.1  --   --   --   --   --   HGB 5.1* 8.9* 9.5* 8.4* 8.1* 7.7*  HCT 17.0* 28.5* 30.0* 26.4* 25.5* 24.2*  MCV 66.1* 72.0* 72.5* 72.9* 73.1* 75.4*  PLT 486* 412* 422* 341 295 291   Cardiac Enzymes: No results for input(s): CKTOTAL, CKMB, CKMBINDEX, TROPONINI in the last 168 hours. BNP (last 3 results) No results for input(s): PROBNP in the last 8760 hours. CBG: No results for input(s): GLUCAP in the last 168 hours.  Recent Results (from the past 240 hour(s))  Surgical pcr screen     Status: None   Collection Time: 08/06/14  2:00 PM  Result Value Ref Range Status   MRSA, PCR NEGATIVE NEGATIVE Final   Staphylococcus aureus NEGATIVE NEGATIVE Final    Comment:        The Xpert SA Assay (FDA approved for NASAL specimens in patients over 90 years of age), is one component of a comprehensive surveillance program.  Test performance has been validated by [employer] for patients greater than or equal to 81 year old. It is not intended to diagnose infection nor to guide or monitor treatment.   Culture, blood (routine x 2)     Status: None (Preliminary result)   Collection Time: 08/06/14  4:10 PM  Result Value Ref Range Status   Specimen Description BLOOD RIGHT HAND  Final   Special Requests BOTTLES DRAWN AEROBIC ONLY 3 CC  Final   Culture  Setup Time   Final    08/06/2014 21:17 Performed at [facility]    Culture   Final           BLOOD CULTURE RECEIVED NO GROWTH TO DATE CULTURE WILL BE HELD FOR 5 DAYS BEFORE ISSUING A FINAL NEGATIVE REPORT Performed at [facility]    Report Status PENDING  Incomplete  Culture, blood (routine x 2)     Status: None (Preliminary result)   Collection Time: 08/06/14  4:20 PM  Result Value Ref Range Status   Specimen Description BLOOD LEFT HAND  Final   Special Requests BOTTLES DRAWN AEROBIC ONLY 6 CC  Final   Culture  Setup Time   Final     08/06/2014 21:17 Performed at [facility]    Culture   Final           BLOOD CULTURE RECEIVED NO GROWTH TO DATE CULTURE WILL BE HELD FOR 5 DAYS BEFORE ISSUING A FINAL NEGATIVE REPORT Performed at [facility]    Report Status PENDING  Incomplete  Culture, Urine     Status: None   Collection Time: 08/07/14 12:01 PM  Result Value Ref Range Status   Specimen Description URINE, CATHETERIZED  Final   Special Requests NONE  Final   Culture  Setup Time   Final    08/07/2014 16:55 Performed at [facility]    Colony Count   Final    55,000 COLONIES/ML Performed at [facility]   Final    ESCHERICHIA COLI Performed at [facility]    Report Status 08/09/2014 FINAL  Final   Organism ID, Bacteria ESCHERICHIA COLI  Final      Susceptibility   Escherichia coli - MIC*    AMPICILLIN >=32 RESISTANT Resistant     CEFAZOLIN 8 SENSITIVE Sensitive     CEFTRIAXONE <=1 SENSITIVE Sensitive     CIPROFLOXACIN 0.5 SENSITIVE Sensitive     GENTAMICIN >=16 RESISTANT Resistant     LEVOFLOXACIN 1 SENSITIVE Sensitive     NITROFURANTOIN <=16 SENSITIVE Sensitive     TOBRAMYCIN 8 INTERMEDIATE Intermediate     TRIMETH/SULFA >=320 RESISTANT Resistant     PIP/TAZO <=4 SENSITIVE Sensitive     * ESCHERICHIA COLI     Studies: US Renal  08/08/2014   CLINICAL DATA:  Acute kidney injury.  EXAM: RENAL/URINARY TRACT ULTRASOUND COMPLETE  COMPARISON:  None.  FINDINGS: Right Kidney:  Length: 7.8 cm. Parenchymal echogenicity is increased. An anechoic or nearly anechoic lesion off the upper pole measures 1.1 x 1.3 x 0.9 cm, most consistent with a cyst. No hydronephrosis.  Left Kidney:  Length: 8.8 cm. Parenchymal echogenicity is increased. An anechoic or nearly anechoic lesion in the region of the junction of the mid and lower poles measures 1.1 x 1.1 x 0.8 cm, also most consistent with a cyst. No hydronephrosis.  Bladder:  Foley catheter is seen in a decompressed  bladder.  IMPRESSION: 1. No acute findings. 2. Increased renal parenchymal echogenicity is indicative of chronic medical renal disease.   Electronically Signed   By: Lorin Picket M.D.   On: 08/08/2014 14:13    Scheduled Meds: . sodium chloride   Intravenous Once  . aspirin EC  325 mg Oral Q breakfast  . feeding supplement (ENSURE COMPLETE)  237 mL Oral BID BM  . heparin subcutaneous  5,000 Units Subcutaneous 3 times per day  . sodium chloride  3 mL Intravenous Q12H   Continuous Infusions: . sodium chloride 100 mL/hr at 08/10/14 0516      Time spent: 25 minutes    Iyauna Sing  [facility] Pager If 7PM-7AM, please contact night-coverage at [email or web address], password [facility] 08/10/2014, 10:26 AM  LOS: 5 days

## 2014-08-10 NOTE — Progress Notes (Signed)
Bladder scanned patient per MD order; bladder scan shows 0 mls.  Jadis Mika, Narda Amber, RN

## 2014-08-10 NOTE — Progress Notes (Signed)
Subjective: 3 Days Post-Op Procedure(s) (LRB): OPEN REDUCTION INTERNAL FIXATION (ORIF)  LEFT FEMUR FRACTURE (Left) Patient resting comfortably.  Objective: Vital signs in last 24 hours: Temp:  [97.5 F (36.4 C)-98.6 F (37 C)] 98.1 F (36.7 C) (12/06 0643) Pulse Rate:  [50-81] 77 (12/06 0643) Resp:  [16-20] 18 (12/06 0643) BP: (94-124)/(44-59) 106/59 mmHg (12/06 0643) SpO2:  [96 %-97 %] 97 % (12/06 0643) Weight:  [42.9 kg (94 lb 9.2 oz)] 42.9 kg (94 lb 9.2 oz) (12/06 0259)  Intake/Output from previous day: 12/05 0701 - 12/06 0700 In: 340 [P.O.:290] Out: 0  Intake/Output this shift: Total I/O In: 1 [P.O.:1] Out: -    Recent Labs  08/08/14 0300 08/09/14 0442 08/10/14 0530  HGB 8.4* 8.1* 7.7*    Recent Labs  08/09/14 0442 08/10/14 0530  WBC 11.2* 9.5  RBC 3.49* 3.21*  HCT 25.5* 24.2*  PLT 295 291    Recent Labs  08/09/14 0442 08/10/14 0530  NA 133* 137  K 4.6 4.2  CL 98 101  CO2 18* 18*  BUN 37* 51*  CREATININE 3.39* 4.09*  GLUCOSE 178* 114*  CALCIUM 8.2* 7.9*   No results for input(s): LABPT, INR in the last 72 hours.  Intact pulses distally Incision: dressing C/D/I  Assessment/Plan: 3 Days Post-Op Procedure(s) (LRB): OPEN REDUCTION INTERNAL FIXATION (ORIF)  LEFT FEMUR FRACTURE (Left)  Active Problems:  Knee fracture  Blood loss anemia  Dehydration  Protein-calorie malnutrition, severe  CAP (community acquired pneumonia)  UTI (urinary tract infection)  Acute encephalopathy  Acute kidney injury  Metabolic acidosis  Left femoral shaft fracture  Anemia, iron deficiency   Advance diet Up with therapy  nwb Dry dressing PRN Continue plan per medicine  Chriss Czar 08/10/2014, 12:18 PM

## 2014-08-10 NOTE — Progress Notes (Signed)
  [facility] Progress Note   Subjective: does not have any complaints  Filed Vitals:   08/10/14 0000 08/10/14 0259 08/10/14 0400 08/10/14 0643  BP:    106/59  Pulse:    77  Temp:    98.1 F (36.7 C)  TempSrc:    Oral  Resp: 16  16 18   Height:      Weight:  42.9 kg (94 lb 9.2 oz)    SpO2:    97%   Exam: Pleasant elderly AAF lying flat, no distress, gen weakness No jvd Chest clear bilat RRR no MRG, occ premature beats Abd soft, NTND, +BS, no ascites or HSM LLE in brace, trace dependent edema RLE trace dependent edema Neuro mild- mod gen weakness, no asterixis, oriented to self, day of the week only  UA 12/2 11-20 wbc, TNTC rbc, few bact, 1.013, pH 6.5 Prot 100 UNa 93, Ucreat 124, FeNa 1.11% CXR 12/3 new R perhilar airspace disease, all lobes of R lung, suspect PNA Renal US 7.8 / 8.8 cm kidneys, echogenic, no hydro, +renal cysts Alb 3.6, BUN 37, Cr 3.39  Assessment: 1. Acute on CRF - no old data, creat 1.7 at baseline, which reflects very poor baseline renal function in this small-framed elderly pt with small kidneys on [place]. Suspect stage IV-V CKD at baseline. Acute element w rising creatinine - needs foley replaced and bladder scan.  Continue IVF's. Not on any nephrotoxins, I have stopped the abx.   Poor dialysis candidate.  Have d/w primary, palliative care will be consulted.   2. Hx HTN - no meds listed on home med list 3. Anemia - Hb 5 > 8 after transfusion  Plan- as above    Kelly Splinter MD  pager [phone]    cell [phone]  08/10/2014, 10:15 AM     Recent Labs Lab 08/08/14 0300 08/09/14 0442 08/10/14 0530  NA 135* 133* 137  K 4.9 4.6 4.2  CL 102 98 101  CO2 14* 18* 18*  GLUCOSE 108* 178* 114*  BUN 29* 37* 51*  CREATININE 2.01* 3.39* 4.09*  CALCIUM 8.4 8.2* 7.9*    Recent Labs Lab 08/05/14 0940  AST 15  ALT 7  ALKPHOS 77  BILITOT 0.2*  PROT 6.9  ALBUMIN 3.6    Recent Labs Lab 08/05/14 0940  08/08/14 0300 08/09/14 0442  08/10/14 0530  WBC 8.5  < > 12.8* 11.2* 9.5  NEUTROABS 7.1  --   --   --   --   HGB 5.1*  < > 8.4* 8.1* 7.7*  HCT 17.0*  < > 26.4* 25.5* 24.2*  MCV 66.1*  < > 72.9* 73.1* 75.4*  PLT 486*  < > 341 295 291  < > = values in this interval not displayed. . sodium chloride   Intravenous Once  . aspirin EC  325 mg Oral Q breakfast  . feeding supplement (ENSURE COMPLETE)  237 mL Oral BID BM  . heparin subcutaneous  5,000 Units Subcutaneous 3 times per day  . sodium chloride  3 mL Intravenous Q12H   . sodium chloride 100 mL/hr at 08/10/14 0516   acetaminophen **OR** acetaminophen, HYDROcodone-acetaminophen, menthol-cetylpyridinium **OR** phenol, morphine injection, ondansetron (ZOFRAN) IV

## 2014-08-11 DIAGNOSIS — Z515 Encounter for palliative care: Secondary | ICD-10-CM

## 2014-08-11 DIAGNOSIS — R630 Anorexia: Secondary | ICD-10-CM | POA: Diagnosis present

## 2014-08-11 DIAGNOSIS — Z0000 Encounter for general adult medical examination without abnormal findings: Secondary | ICD-10-CM

## 2014-08-11 DIAGNOSIS — E872 Acidosis: Secondary | ICD-10-CM

## 2014-08-11 LAB — BASIC METABOLIC PANEL
ANION GAP: 20 — AB (ref 5–15)
BUN: 62 mg/dL — ABNORMAL HIGH (ref 6–23)
CO2: 15 meq/L — AB (ref 19–32)
CREATININE: 4.18 mg/dL — AB (ref 0.50–1.10)
Calcium: 8 mg/dL — ABNORMAL LOW (ref 8.4–10.5)
Chloride: 104 mEq/L (ref 96–112)
GFR calc Af Amer: 10 mL/min — ABNORMAL LOW (ref >90)
GFR calc non Af Amer: 9 mL/min — ABNORMAL LOW (ref >90)
Glucose, Bld: 102 mg/dL — ABNORMAL HIGH (ref 70–99)
POTASSIUM: 4.9 meq/L (ref 3.7–5.3)
Sodium: 139 mEq/L (ref 137–147)

## 2014-08-11 LAB — CLOSTRIDIUM DIFFICILE BY PCR: CDIFFPCR: NEGATIVE

## 2014-08-11 LAB — CK: Total CK: 266 U/L — ABNORMAL HIGH (ref 7–177)

## 2014-08-11 MED ORDER — FERROUS SULFATE 325 (65 FE) MG PO TABS
325.0000 mg | ORAL_TABLET | Freq: Three times a day (TID) | ORAL | Status: DC
  Administered 2014-08-11 – 2014-08-14 (×8): 325 mg via ORAL
  Filled 2014-08-11 (×14): qty 1

## 2014-08-11 NOTE — Progress Notes (Signed)
[facility] PROGRESS NOTE  Alicia Guerra [id number] DOB: [birth year] DOA: 08/05/2014 PCP: [name], MD  brief narrative 80 year old female being independently prior to admission presented to [facility] ED after sustaining a fall when she slid down the steps and hit her left knee. Extraocular left knee none in the ED showed comminuted fracture of the distal femoral diaphysis with angulation and displacement at thie the fracture site. Patient transferred to [name] for orthopedic evaluation. Patient was also found to have a hemoglobin of 5.1 with guaiac-positive stools.  On day 2 of admission patient febrile, encephalopathic with increased leukocytosis and right lung pneumonia. Also has UTI. tolerated ORIF on 12/3. hospital course now complicated due to advanced renal failure.   Assessment/Plan: Advanced acute on chronic kidney disease No old labs in the system. FENa<1. Renal function progressively worsening with poor urine output.. Small kidneys on renal US.  Foley placed by urology on 12/6. Bladder scan with minimal urine. Unexplained worsening renal function. Her age and debility, she is a poor HD candidate. palliative team consulted to discuss [place] with family.  Niece updated on the phone about poor prognosis. Discussed with patient this morning but I am not sure she understood it well. -Likely needs hospice.  SIRS with acute encephalopathy Secondary to community acquired versus aspiration pneumonia. now has mild uremic sx. urine culture growing Escherichia coli. blood cultures Negative. Completed empiric Rocephin and azithromycin. Seen by swallow eval and recommends dysphagia level I diet and will reassess while in the hospital.   Left knee and displaced distal femoral metaphysis fracture -s/p ORIF on 12/3 .  Marland Kitchen Pain control with when necessary narcotics.    Microcytic anemia iron panel suggests iron deficiency. Patient guiac positive in the ED.  Hemoglobin improved with 2 u PRBC on admission.   Metabolic acidosis Secondary to progressive  kidney disease. Continue IV fluids.   Severe Protein Calorie malnutrition Nutrition consult appreciated. Added ensure twice a day  DVT prophylaxis: sq heparin and full dose ASA  Code Status: Full code, pending palliative care discussion  Family Communication: spoke with niece on the phone Disposition Plan: Given progressively worsening renal function will likely need hospice. Palliative care discussion for goals of care pending   Consultants:  Ortho  Renal  Palliative care  Procedures:  ORIF left femur fracture on 12/3  Antibiotics:  rocephin and azithromycin 12/2--12/6  HPI/Subjective: Pt seen and examined this am. Denies any pain. Foley placed in yesterday with poor urine output   Objective: Filed Vitals:   08/11/14 0423  BP: 134/52  Pulse:   Temp: 97.5 F (36.4 C)  Resp: 18    Intake/Output Summary (Last 24 hours) at 08/11/14 1056 Last data filed at 08/11/14 [id number]  Gross per 24 hour  Intake   1260 ml  Output    225 ml  Net   1035 ml   Filed Weights   08/05/14 0824 08/10/14 0259 08/11/14 0423  Weight: 54.432 kg (120 lb) 42.9 kg (94 lb 9.2 oz) 44.1 kg (97 lb 3.6 oz)    Exam:   General: Elderly thin built female in no acute distress  HEENT: Hoarse voice, moist oral mucosa  Chest: clear Breath sounds bilaterally  CVS: Normal S1 and S2, no murmurs  Abdomen: Soft, nondistended, nontender, bowel sounds present, Foley in place  Extremities: Warm, left knee brace  CNS: Alert and oriented  Data Reviewed: Basic Metabolic Panel:  Recent Labs Lab 08/07/14 0324 08/08/14 0300 08/09/14 0442 08/10/14 0530 08/11/14 0840  NA 135* 135* 133* 137 139  K 5.3 4.9 4.6 4.2 4.9  CL 101 102 98 101 104  CO2 17* 14* 18* 18* 15*  GLUCOSE 160* 108* 178* 114* 102*  BUN 24* 29* 37* 51* 62*  CREATININE 1.77* 2.01* 3.39* 4.09* 4.18*  CALCIUM 9.1 8.4 8.2* 7.9*  8.0*  MG  --  2.1  --   --   --    Liver Function Tests:  Recent Labs Lab 08/05/14 0940  AST 15  ALT 7  ALKPHOS 77  BILITOT 0.2*  PROT 6.9  ALBUMIN 3.6   No results for input(s): LIPASE, AMYLASE in the last 168 hours. No results for input(s): AMMONIA in the last 168 hours. CBC:  Recent Labs Lab 08/05/14 0940 08/06/14 0535 08/07/14 0324 08/08/14 0300 08/09/14 0442 08/10/14 0530  WBC 8.5 14.4* 18.2* 12.8* 11.2* 9.5  NEUTROABS 7.1  --   --   --   --   --   HGB 5.1* 8.9* 9.5* 8.4* 8.1* 7.7*  HCT 17.0* 28.5* 30.0* 26.4* 25.5* 24.2*  MCV 66.1* 72.0* 72.5* 72.9* 73.1* 75.4*  PLT 486* 412* 422* 341 295 291   Cardiac Enzymes: No results for input(s): CKTOTAL, CKMB, CKMBINDEX, TROPONINI in the last 168 hours. BNP (last 3 results) No results for input(s): PROBNP in the last 8760 hours. CBG: No results for input(s): GLUCAP in the last 168 hours.  Recent Results (from the past 240 hour(s))  Surgical pcr screen     Status: None   Collection Time: 08/06/14  2:00 PM  Result Value Ref Range Status   MRSA, PCR NEGATIVE NEGATIVE Final   Staphylococcus aureus NEGATIVE NEGATIVE Final    Comment:        The Xpert SA Assay (FDA approved for NASAL specimens in patients over 8 years of age), is one component of a comprehensive surveillance program.  Test performance has been validated by [employer] for patients greater than or equal to 53 year old. It is not intended to diagnose infection nor to guide or monitor treatment.   Culture, blood (routine x 2)     Status: None (Preliminary result)   Collection Time: 08/06/14  4:10 PM  Result Value Ref Range Status   Specimen Description BLOOD RIGHT HAND  Final   Special Requests BOTTLES DRAWN AEROBIC ONLY 3 CC  Final   Culture  Setup Time   Final    08/06/2014 21:17 Performed at [facility]    Culture   Final           BLOOD CULTURE RECEIVED NO GROWTH TO DATE CULTURE WILL BE HELD FOR 5 DAYS BEFORE ISSUING A FINAL  NEGATIVE REPORT Performed at [facility]    Report Status PENDING  Incomplete  Culture, blood (routine x 2)     Status: None (Preliminary result)   Collection Time: 08/06/14  4:20 PM  Result Value Ref Range Status   Specimen Description BLOOD LEFT HAND  Final   Special Requests BOTTLES DRAWN AEROBIC ONLY 6 CC  Final   Culture  Setup Time   Final    08/06/2014 21:17 Performed at [facility]    Culture   Final           BLOOD CULTURE RECEIVED NO GROWTH TO DATE CULTURE WILL BE HELD FOR 5 DAYS BEFORE ISSUING A FINAL NEGATIVE REPORT Performed at [facility]    Report Status PENDING  Incomplete  Culture, Urine     Status: None  Collection Time: 08/07/14 12:01 PM  Result Value Ref Range Status   Specimen Description URINE, CATHETERIZED  Final   Special Requests NONE  Final   Culture  Setup Time   Final    08/07/2014 16:55 Performed at [facility]   Final    55,000 COLONIES/ML Performed at [facility]   Final    ESCHERICHIA COLI Performed at [facility]    Report Status 08/09/2014 FINAL  Final   Organism ID, Bacteria ESCHERICHIA COLI  Final      Susceptibility   Escherichia coli - MIC*    AMPICILLIN >=32 RESISTANT Resistant     CEFAZOLIN 8 SENSITIVE Sensitive     CEFTRIAXONE <=1 SENSITIVE Sensitive     CIPROFLOXACIN 0.5 SENSITIVE Sensitive     GENTAMICIN >=16 RESISTANT Resistant     LEVOFLOXACIN 1 SENSITIVE Sensitive     NITROFURANTOIN <=16 SENSITIVE Sensitive     TOBRAMYCIN 8 INTERMEDIATE Intermediate     TRIMETH/SULFA >=320 RESISTANT Resistant     PIP/TAZO <=4 SENSITIVE Sensitive     * ESCHERICHIA COLI     Studies: No results found.  Scheduled Meds: . aspirin EC  325 mg Oral Q breakfast  . feeding supplement (ENSURE COMPLETE)  237 mL Oral BID BM  . heparin subcutaneous  5,000 Units Subcutaneous 3 times per day  . sodium chloride  3 mL Intravenous Q12H   Continuous Infusions: .  sodium chloride 100 mL/hr at 08/11/14 0526       Time spent: 25 minutes    Braelyn Bordonaro, [place]  [facility] Pager [phone]. If 7PM-7AM, please contact night-coverage at [email or web address], password [facility] 08/11/2014, 10:56 AM  LOS: 6 days

## 2014-08-11 NOTE — Progress Notes (Signed)
Orthopedic Tech Progress Note Patient Details:  Alicia Guerra 27-May-1924 [id number]  Ortho Devices Type of Ortho Device: Knee Immobilizer Ortho Device/Splint Location: lle Ortho Device/Splint Interventions: Application   Hildred Priest 08/11/2014, 4:37 PM

## 2014-08-11 NOTE — Progress Notes (Signed)
CARE MANAGEMENT NOTE 08/11/2014  Patient:  Alicia Guerra, Alicia Guerra   Account Number:  [id number]  Date Initiated:  08/11/2014  Documentation initiated by:  [facility]  Subjective/Objective Assessment:   mechanical fall s/p ORIF 08/07/14     Action/Plan:   SNF   Anticipated DC Date:  08/12/2014   Anticipated DC Plan:  [place]  CM consult      Choice offered to / List presented to:             Status of service:  Completed, signed off Medicare Important Message given?  YES (If response is "NO", the following Medicare IM given date fields will be blank) Date Medicare IM given:  08/11/2014 Medicare IM given by:  [facility] Date Additional Medicare IM given:   Additional Medicare IM given by:    Discharge Disposition:  [place]  Per UR Regulation:    If discussed at Long Length of Stay Meetings, dates discussed:    Comments:  08/11/2014 1400 NCM spoke to pt and plan is dc to SNF. Niece, [place] plans to do POA. Jonnie Finner RN CCM Case Mgmt phone [phone]

## 2014-08-11 NOTE — Clinical Social Work Psychosocial (Signed)
Clinical Social Work Department BRIEF PSYCHOSOCIAL ASSESSMENT 08/08/2014  Patient:  Alicia Guerra, Alicia Guerra     Account Number:  [id number]     Admit date:  08/05/2014  Clinical Social Worker:  Dian Queen  Date/Time:  08/08/2014 11:20 AM  Referred by:  Physician  Date Referred:  08/08/2014 Referred for  SNF Placement   Other Referral:   Interview type:  Patient Other interview type:   Family    PSYCHOSOCIAL DATA Living Status:  ALONE Admitted from facility:   Level of care:   Primary support name:  Thurmond Butts Primary support relationship to patient:  FAMILY Degree of support available:   Patient lives on her own, but her family are mainly in [place] and some in [place] Current Concerns  Post-Acute Placement   Other Concerns:    [place] / PLAN Patient is an 80 year old female who was living on her own, alert and oriented x3.  Patient's family was at bedside and information was given about SNF placement and how the goal is to go back home once therapy has been completed. Patient stated she would like either [facility] or [facility], but most of her family lives in [place] which would be the preference.  Informed patient and family about the process of going to SNF vs Home health. Patient and family are in agreement to go to SNF for short term rehab once patient is medically ready and discharge orders have been received.   Assessment/plan status:   Other assessment/ plan:   Information/referral to community resources:    PATIENT'S/FAMILY'S RESPONSE TO PLAN OF CARE: Patient and family agreeable to going to SNF for short term rehab.    Jones Broom. Harris, MSW, [place] 08/11/2014 11:30 AM

## 2014-08-11 NOTE — Progress Notes (Signed)
Full note to follow:  I met today with Alicia Guerra and her niece, Alicia Guerra, at bedside. Gave HCPOA papers to complete as requested. I had a long conversation with them both who tell me that Ms. Gram's priority is to be at home and to be as independent as she is able. The decision they are being faced with is the possibility of initiating dialysis. Alicia Guerra tells me that they have other family members on dialysis so she understands this is not an easy undertaking. I am not sure [name] understands the gravity of this decision - right now she tells me that she is praying "it doesn't come to that." She is hoping not to be faced with this decision. Unfortunately, if her renal function continues to decline she may be faced with limited time without dialysis. I did explain that I do not believe dialysis would improve her quality of life even if it gives her a longer quantity. I explained that being transported to a dialysis center 3 days a week for 3-4 hours a day would be extremely difficult. I believe Alicia Guerra understands this more than [name] understands. However, I fear that they will likely be faced with a decision they are not prepared to make over the next days. I will continue to follow and support them while they struggle with decisions to be made.   Vinie Sill, NP Palliative Medicine Team Pager # [phone] (M-F 8a-5p) Team Phone # [phone] (Nights/Weekends)

## 2014-08-11 NOTE — Progress Notes (Signed)
Admit: 08/05/2014 LOS: 6  12F found down with L femur fracture, likely mechanical fall s/p ORIF 08/07/14 with Ao?CKD (BL SCr ?1.7).  PMH also with HTN, OA, Anemia  Subjective:  Req Urology c/s for Foley yesterday, 6mL when placed yesterday Little UOP Slight worsening in GFR/BUN; K ok Has mild acidosis   12/06 0701 - 12/07 0700 In: 1261 [P.O.:61; I.V.:1200] Out: 225 [Urine:225]  Filed Weights   08/05/14 0824 08/10/14 0259 08/11/14 0423  Weight: 54.432 kg (120 lb) 42.9 kg (94 lb 9.2 oz) 44.1 kg (97 lb 3.6 oz)    Scheduled Meds: . aspirin EC  325 mg Oral Q breakfast  . feeding supplement (ENSURE COMPLETE)  237 mL Oral BID BM  . heparin subcutaneous  5,000 Units Subcutaneous 3 times per day  . sodium chloride  3 mL Intravenous Q12H   Continuous Infusions: . sodium chloride 100 mL/hr at 08/11/14 0526   PRN Meds:.acetaminophen **OR** acetaminophen, HYDROcodone-acetaminophen, menthol-cetylpyridinium **OR** phenol, morphine injection, ondansetron (ZOFRAN) IV  Current Labs: reviewed    Physical Exam:  Blood pressure 134/52, pulse 77, temperature 97.5 F (36.4 C), temperature source Oral, resp. rate 18, height 5' (1.524 m), weight 44.1 kg (97 lb 3.6 oz), SpO2 97 %. NAD RRR CTAB No LEE S/nt/nd Interactive, awake EOMI   Assessment/Plan 1. AoCKD 1. No HN by Renal US 08/08/14 2. Foley placed 12/6, minimal UOP 3. Likely ATN related to hypotension 4. Check CK today (normal) 5. I don't think she will benefit from chronic dialysis 6. Palliative care working with patient/family 2. Distal L femur fracture, s/p ORIF 3. Anemia,  1. 08/05/14 TSAT 4 and Ferritin 14 2. Transfused 2u during admission 3. Place on PO Fe 4. HTN 1. Controlled, no meds  Pearson Grippe MD 08/11/2014, 11:04 AM   Recent Labs Lab 08/09/14 0442 08/10/14 0530 08/11/14 0840  NA 133* 137 139  K 4.6 4.2 4.9  CL 98 101 104  CO2 18* 18* 15*  GLUCOSE 178* 114* 102*  BUN 37* 51* 62*  CREATININE 3.39* 4.09*  4.18*  CALCIUM 8.2* 7.9* 8.0*    Recent Labs Lab 08/05/14 0940  08/08/14 0300 08/09/14 0442 08/10/14 0530  WBC 8.5  < > 12.8* 11.2* 9.5  NEUTROABS 7.1  --   --   --   --   HGB 5.1*  < > 8.4* 8.1* 7.7*  HCT 17.0*  < > 26.4* 25.5* 24.2*  MCV 66.1*  < > 72.9* 73.1* 75.4*  PLT 486*  < > 341 295 291  < > = values in this interval not displayed.

## 2014-08-11 NOTE — Consult Note (Signed)
Patient Alicia Guerra      DOB: 1923-12-01      FTD:[id number]     Consult Note from the Palliative Medicine Team at [facility] Requested by: Dr. Laverle Patter     PCP: [name], MD Reason for Consultation: [place]     Phone [phone]  Assessment of patients Current state: I met today with Alicia Guerra and her niece, Alicia Guerra, at bedside. Gave HCPOA papers to complete as requested. I had a long conversation with them both who tell me that Alicia Guerra's priority is to be at home and to be as independent as she is able. The decision they are being faced with is the possibility of initiating dialysis. Alicia Guerra tells me that they have other family members on dialysis so she understands this is not an easy undertaking. I am not sure Alicia Guerra understands the gravity of this decision - right now she tells me that she is praying "it doesn't come to that." She is hoping not to be faced with this decision. Unfortunately, if her renal function continues to decline she may be faced with limited time without dialysis. I did explain that I do not believe dialysis would improve her quality of life even if it gives her a longer quantity. I explained that being transported to a dialysis center 3 days a week for 3-4 hours a day would be extremely difficult. I believe Alicia Guerra understands this more than Alicia Guerra understands. However, I fear that they will likely be faced with a decision they are not prepared to make over the next days. I will continue to follow and support them while they struggle with decisions to be made.    Goals of Care: 1.  Code Status: FULL - did not get to discuss today.    2. Scope of Treatment: Continue all available and offered medical interventions. They do not really want dialysis but are hoping her kidneys will improve so she is not faced with this imminent decision.    4. Disposition: To be determined on outcomes.    3. Symptom Management:   1. Decreased  appetite: Consider nutritional supplement. Small frequent meals. Liberalize diet as much as possible.   4. Psychosocial: Emotional support provided to patient and family at bedside.   5. Spiritual: She is a very spiritual and religious person and pulls comfort from this part of her life.    Patient Documents Completed or Given: Document Given Completed  Advanced Directives Pkt    MOST    DNR    Gone from My Sight    Hard Choices      Brief HPI: 80 yo female admitted with left knee fracture after falling down her steps found to have low hemoglobin and likely acute on chronic renal failure. She was also treated for CAP vs aspiration pneumonia. PMH significant for HTN and arthritis.    ROS: Denies pain, anxiety, sleep disturbance, constipation. + decreased appetite    PMH:  Past Medical History  Diagnosis Date  . Hypertension   . Arthritis   . Anemia   . History of blood transfusion     "today is the first time" (08/05/2014)     PSH: Past Surgical History  Procedure Laterality Date  . Cataract extraction Left   . Tonsillectomy    . Appendectomy    . Cholecystectomy    . Cystectomy      "had cyst taken off lower back"  . Orif femur fracture Left  08/07/2014    Procedure: OPEN REDUCTION INTERNAL FIXATION (ORIF)  LEFT FEMUR FRACTURE;  Surgeon: [name], MD;  Location: [facility];  Service: Orthopedics;  Laterality: Left;   I have reviewed the [place] and SH and  If appropriate update it with new information. No Known Allergies Scheduled Meds: . aspirin EC  325 mg Oral Q breakfast  . feeding supplement (ENSURE COMPLETE)  237 mL Oral BID BM  . heparin subcutaneous  5,000 Units Subcutaneous 3 times per day  . sodium chloride  3 mL Intravenous Q12H   Continuous Infusions: . sodium chloride 100 mL/hr at 08/11/14 0526   PRN Meds:.acetaminophen **OR** acetaminophen, HYDROcodone-acetaminophen, menthol-cetylpyridinium **OR** phenol, morphine injection, ondansetron (ZOFRAN)  IV    BP 134/52 mmHg  Pulse 77  Temp(Src) 97.5 F (36.4 C) (Oral)  Resp 18  Ht 5' (1.524 m)  Wt 44.1 kg (97 lb 3.6 oz)  BMI 18.99 kg/m2  SpO2 97%   PPS: 30%   Intake/Output Summary (Last 24 hours) at 08/11/14 1145 Last data filed at 08/11/14 0649  Gross per 24 hour  Intake   1260 ml  Output    225 ml  Net   1035 ml   LBM: 08/12/14  Physical Exam:  General: NAD, elderly, thin, pleasant HEENT: [place]/AT, no JVD, moist mucous membranes Chest: CTA throughout, no labored breathing, symmetric CVS: RRR, S1 S2 Abdomen: Soft, NT, ND, +BS Ext: MAE, no edema, warm to touch, left knee brace in place Neuro: Awake, alert, oriented x 3   Labs: CBC    Component Value Date/Time   WBC 9.5 08/10/2014 0530   RBC 3.21* 08/10/2014 0530   RBC 2.56* 08/05/2014 0940   HGB 7.7* 08/10/2014 0530   HCT 24.2* 08/10/2014 0530   PLT 291 08/10/2014 0530   MCV 75.4* 08/10/2014 0530   MCH 24.0* 08/10/2014 0530   MCHC 31.8 08/10/2014 0530   RDW 23.1* 08/10/2014 0530   LYMPHSABS 0.9 08/05/2014 0940   MONOABS 0.4 08/05/2014 0940   EOSABS 0.1 08/05/2014 0940   BASOSABS 0.0 08/05/2014 0940    BMET    Component Value Date/Time   NA 139 08/11/2014 0840   K 4.9 08/11/2014 0840   CL 104 08/11/2014 0840   CO2 15* 08/11/2014 0840   GLUCOSE 102* 08/11/2014 0840   BUN 62* 08/11/2014 0840   CREATININE 4.18* 08/11/2014 0840   CALCIUM 8.0* 08/11/2014 0840   GFRNONAA 9* 08/11/2014 0840   GFRAA 10* 08/11/2014 0840    CMP     Component Value Date/Time   NA 139 08/11/2014 0840   K 4.9 08/11/2014 0840   CL 104 08/11/2014 0840   CO2 15* 08/11/2014 0840   GLUCOSE 102* 08/11/2014 0840   BUN 62* 08/11/2014 0840   CREATININE 4.18* 08/11/2014 0840   CALCIUM 8.0* 08/11/2014 0840   PROT 6.9 08/05/2014 0940   ALBUMIN 3.6 08/05/2014 0940   AST 15 08/05/2014 0940   ALT 7 08/05/2014 0940   ALKPHOS 77 08/05/2014 0940   BILITOT 0.2* 08/05/2014 0940   GFRNONAA 9* 08/11/2014 0840   GFRAA 10* 08/11/2014  0840      Time In Time Out Total Time Spent with Patient Total Overall Time  1030 1150 52mn 823m    Greater than 50%  of this time was spent counseling and coordinating care related to the above assessment and plan.  AlVinie SillNP Palliative Medicine Team Pager # [phone]-F 8a-5p) Team Phone # [phone]/Weekends)

## 2014-08-12 DIAGNOSIS — E43 Unspecified severe protein-calorie malnutrition: Secondary | ICD-10-CM

## 2014-08-12 LAB — BASIC METABOLIC PANEL
ANION GAP: 22 — AB (ref 5–15)
BUN: 65 mg/dL — ABNORMAL HIGH (ref 6–23)
CO2: 12 mEq/L — ABNORMAL LOW (ref 19–32)
Calcium: 8.4 mg/dL (ref 8.4–10.5)
Chloride: 107 mEq/L (ref 96–112)
Creatinine, Ser: 3.87 mg/dL — ABNORMAL HIGH (ref 0.50–1.10)
GFR, EST AFRICAN AMERICAN: 11 mL/min — AB (ref >90)
GFR, EST NON AFRICAN AMERICAN: 10 mL/min — AB (ref >90)
GLUCOSE: 123 mg/dL — AB (ref 70–99)
POTASSIUM: 5 meq/L (ref 3.7–5.3)
Sodium: 141 mEq/L (ref 137–147)

## 2014-08-12 LAB — CULTURE, BLOOD (ROUTINE X 2)
CULTURE: NO GROWTH
Culture: NO GROWTH

## 2014-08-12 LAB — C3 COMPLEMENT: C3 COMPLEMENT: 160 mg/dL (ref 90–180)

## 2014-08-12 LAB — C4 COMPLEMENT: Complement C4, Body Fluid: 33 mg/dL (ref 10–40)

## 2014-08-12 NOTE — Progress Notes (Addendum)
[facility] PROGRESS NOTE  Alicia Guerra [id number] DOB: [birth year] DOA: 08/05/2014 PCP: [name], MD  brief narrative 80 year old female being independently prior to admission presented to [facility] ED after sustaining a fall when she slid down the steps and hit her left knee. Extraocular left knee none in the ED showed comminuted fracture of the distal femoral diaphysis with angulation and displacement at thie the fracture site. Patient transferred to [name] for orthopedic evaluation. Patient was also found to have a hemoglobin of 5.1 with guaiac-positive stools.  On day 2 of admission patient febrile, encephalopathic with increased leukocytosis and right lung pneumonia. Also has UTI. tolerated ORIF on 12/3. hospital course now complicated due to advanced oliguric renal failure.   Assessment/Plan: Advanced acute on chronic kidney disease No old labs in the system. FENa<1. Renal function progressively worsening with poor urine output.. Small kidneys on renal US.  Foley placed by urology on 12/6. Bladder scan with minimal urine. Unexplained worsening renal function possibly related to hypotension. remains oliguric. Given her age and debility, she is a poor HD candidate. palliative team discussing goals of care with patient and her neice. They seem inclined towards hospice now.   SIRS with acute encephalopathy Secondary to community acquired versus aspiration pneumonia. now has mild uremic sx. urine culture grew Escherichia coli. blood cultures Negative. Completed empiric Rocephin and azithromycin. Seen by swallow eval and recommends dysphagia level I diet .   Left knee and displaced distal femoral metaphysis fracture -s/p ORIF on 12/3 .  Marland Kitchen Pain control with when necessary narcotics.    Microcytic anemia iron panel suggests iron deficiency. Patient guiac positive in the ED. Hemoglobin improved with 2 u PRBC on admission.   Metabolic  acidosis Secondary to progressive  kidney disease. Fluids switch to bicarbonate drip.   Severe Protein Calorie malnutrition Nutrition consult appreciated. Added ensure twice a day  DVT prophylaxis: sq heparin and full dose ASA  Code Status: Full code, palliative care discussion in progress.  Family Communication: Niece at bedside Disposition Plan: Given progressively worsening renal function will likely need hospice. Will be determined in the next 24 hours. (Home versus residential hospice)  Consultants:  Ortho  Renal  Palliative care  Procedures:  ORIF left femur fracture on 12/3  Antibiotics:  rocephin and azithromycin 12/2--12/6  HPI/Subjective: Pt seen and examined this am. Denies any pain. Reports poor appetite. Niece at bedside   Objective: Filed Vitals:   08/12/14 0922  BP:   Pulse:   Temp:   Resp: 18    Intake/Output Summary (Last 24 hours) at 08/12/14 1346 Last data filed at 08/11/14 1700  Gross per 24 hour  Intake      0 ml  Output      2 ml  Net     -2 ml   Filed Weights   08/10/14 0259 08/11/14 0423 08/12/14 0500  Weight: 42.9 kg (94 lb 9.2 oz) 44.1 kg (97 lb 3.6 oz) 44.9 kg (98 lb 15.8 oz)    Exam:   General: Elderly thin built female in no acute distress  HEENT: Hoarse voice, moist oral mucosa  Chest: clear Breath sounds bilaterally  CVS: Normal S1 and S2, no murmurs  Abdomen: Soft, nondistended, nontender, bowel sounds present, Foley in place  Extremities: Warm, left knee brace  CNS: Alert and oriented  Data Reviewed: Basic Metabolic Panel:  Recent Labs Lab 08/08/14 0300 08/09/14 0442 08/10/14 0530 08/11/14 0840 08/12/14 1030  NA 135* 133* 137 139 141  K 4.9 4.6 4.2 4.9 5.0  CL 102 98 101 104 107  CO2 14* 18* 18* 15* 12*  GLUCOSE 108* 178* 114* 102* 123*  BUN 29* 37* 51* 62* 65*  CREATININE 2.01* 3.39* 4.09* 4.18* 3.87*  CALCIUM 8.4 8.2* 7.9* 8.0* 8.4  MG 2.1  --   --   --   --    Liver Function Tests: No  results for input(s): AST, ALT, ALKPHOS, BILITOT, PROT, ALBUMIN in the last 168 hours. No results for input(s): LIPASE, AMYLASE in the last 168 hours. No results for input(s): AMMONIA in the last 168 hours. CBC:  Recent Labs Lab 08/06/14 0535 08/07/14 0324 08/08/14 0300 08/09/14 0442 08/10/14 0530  WBC 14.4* 18.2* 12.8* 11.2* 9.5  HGB 8.9* 9.5* 8.4* 8.1* 7.7*  HCT 28.5* 30.0* 26.4* 25.5* 24.2*  MCV 72.0* 72.5* 72.9* 73.1* 75.4*  PLT 412* 422* 341 295 291   Cardiac Enzymes:  Recent Labs Lab 08/11/14 0840  CKTOTAL 266*   BNP (last 3 results) No results for input(s): PROBNP in the last 8760 hours. CBG: No results for input(s): GLUCAP in the last 168 hours.  Recent Results (from the past 240 hour(s))  Surgical pcr screen     Status: None   Collection Time: 08/06/14  2:00 PM  Result Value Ref Range Status   MRSA, PCR NEGATIVE NEGATIVE Final   Staphylococcus aureus NEGATIVE NEGATIVE Final    Comment:        The Xpert SA Assay (FDA approved for NASAL specimens in patients over 51 years of age), is one component of a comprehensive surveillance program.  Test performance has been validated by [employer] for patients greater than or equal to 68 year old. It is not intended to diagnose infection nor to guide or monitor treatment.   Culture, blood (routine x 2)     Status: None   Collection Time: 08/06/14  4:10 PM  Result Value Ref Range Status   Specimen Description BLOOD RIGHT HAND  Final   Special Requests BOTTLES DRAWN AEROBIC ONLY 3 CC  Final   Culture  Setup Time   Final    08/06/2014 21:17 Performed at [facility]    Culture   Final    NO GROWTH 5 DAYS Performed at [facility]    Report Status 08/12/2014 FINAL  Final  Culture, blood (routine x 2)     Status: None   Collection Time: 08/06/14  4:20 PM  Result Value Ref Range Status   Specimen Description BLOOD LEFT HAND  Final   Special Requests BOTTLES DRAWN AEROBIC ONLY 6 CC  Final    Culture  Setup Time   Final    08/06/2014 21:17 Performed at [facility]    Culture   Final    NO GROWTH 5 DAYS Performed at [facility]    Report Status 08/12/2014 FINAL  Final  Culture, Urine     Status: None   Collection Time: 08/07/14 12:01 PM  Result Value Ref Range Status   Specimen Description URINE, CATHETERIZED  Final   Special Requests NONE  Final   Culture  Setup Time   Final    08/07/2014 16:55 Performed at [place]   Final    55,000 COLONIES/ML Performed at [place]   Final    ESCHERICHIA COLI Performed at [facility]    Report Status 08/09/2014 FINAL  Final  Organism ID, Bacteria ESCHERICHIA COLI  Final      Susceptibility   Escherichia coli - MIC*    AMPICILLIN >=32 RESISTANT Resistant     CEFAZOLIN 8 SENSITIVE Sensitive     CEFTRIAXONE <=1 SENSITIVE Sensitive     CIPROFLOXACIN 0.5 SENSITIVE Sensitive     GENTAMICIN >=16 RESISTANT Resistant     LEVOFLOXACIN 1 SENSITIVE Sensitive     NITROFURANTOIN <=16 SENSITIVE Sensitive     TOBRAMYCIN 8 INTERMEDIATE Intermediate     TRIMETH/SULFA >=320 RESISTANT Resistant     PIP/TAZO <=4 SENSITIVE Sensitive     * ESCHERICHIA COLI  Clostridium Difficile by PCR     Status: None   Collection Time: 08/11/14  3:09 PM  Result Value Ref Range Status   C difficile by pcr NEGATIVE NEGATIVE Final     Studies: No results found.  Scheduled Meds: . aspirin EC  325 mg Oral Q breakfast  . feeding supplement (ENSURE COMPLETE)  237 mL Oral BID BM  . ferrous sulfate  325 mg Oral TID WC  . heparin subcutaneous  5,000 Units Subcutaneous 3 times per day  . sodium chloride  3 mL Intravenous Q12H   Continuous Infusions:       Time spent: 25 minutes    Meriem Lemieux, [place]  [facility] Pager [phone]. If 7PM-7AM, please contact night-coverage at [email or web address], password [facility] 08/12/2014, 1:46 PM  LOS: 7 days

## 2014-08-12 NOTE — Clinical Social Work Note (Signed)
Met with patient and her family to discuss if a decision has been made on if patient is going to go to hospice residential or in home.  Spoke with patient and family to determine if they would like CSW to contact hospice to get the process started for residential hospice.  Patient and family agreeable to CSW contacting [facility].  Jones Broom. [place], MSW, [place] 08/12/2014 4:35 PM

## 2014-08-12 NOTE — Progress Notes (Signed)
Physical Therapy Treatment Patient Details Name: Alicia Guerra MRN: [id number] DOB: [birth year] Today's Date: 08/12/2014    History of Present Illness Pt is an 80 y.o. female with PMH: arthritis, HTN, anemia with distal left femur fracture after a fall.  Now s/p ORIF Left femur on 08/07/14.    PT Comments    Pt far more alert and somewhat participative today. Pt requires max cues and assist to perform transfers and mobility. Pt performed multiple transfers and even with positioning in midline in chair reverts to significant left lean in chair. Pt educated for precautions but unable to recall precautions or orientation end of session. Will continue to follow to progress mobility. Niece present throughout session and stating pt is much different cognitively than baseline but does have periods of lucidity.   Follow Up Recommendations  Supervision/Assistance - 24 hour;SNF     Equipment Recommendations       Recommendations for Other Services       Precautions / Restrictions Precautions Precautions: Fall Required Braces or Orthoses: Knee Immobilizer - Left Knee Immobilizer - Left: On at all times Restrictions LLE Weight Bearing: Non weight bearing    Mobility  Bed Mobility Overal bed mobility: Needs Assistance;+2 for physical assistance Bed Mobility: Supine to Sit     Supine to sit: Total assist;+2 for physical assistance;HOB elevated     General bed mobility comments: pt with total assist to rotate pelvis to EOB and elevate trunk from surface with no initiation  Transfers Overall transfer level: Needs assistance   Transfers: Sit to/from Stand;Stand Pivot Transfers Sit to Stand: Max assist;+2 physical assistance Stand pivot transfers: Total assist;+2 physical assistance       General transfer comment: Pt bearing weight through RLE during transfers and holding onto therapists with cues. Requires RLE blocked at all times. Pt able to maintain NWB LLE with total assist from  therapist to keep leg off the ground.3 trials sit to stand due to BM with transfer and assist for pericare  Ambulation/Gait                 Stairs            Wheelchair Mobility    Modified Rankin (Stroke Patients Only)       Balance Overall balance assessment: Needs assistance Sitting-balance support: Bilateral upper extremity supported Sitting balance-Leahy Scale: Zero Sitting balance - Comments: pt with left lean throughout and with mod-max assist able to achieve midline and would lean to Right only if therapist seated there Postural control: Posterior lean   Standing balance-Leahy Scale: Zero Standing balance comment: total assist +2                    Cognition Arousal/Alertness: Awake/alert Behavior During Therapy: WFL for tasks assessed/performed Overall Cognitive Status: Impaired/Different from baseline Area of Impairment: Orientation;Memory;Attention;Following commands;Safety/judgement;Problem solving Orientation Level: Disoriented to;Place;Time;Situation Current Attention Level: Focused Memory: Decreased recall of precautions;Decreased short-term memory Following Commands: Follows one step commands inconsistently Safety/Judgement: Decreased awareness of safety   Problem Solving: Slow processing;Decreased initiation;Difficulty sequencing;Requires verbal cues;Requires tactile cues      Exercises General Exercises - Lower Extremity Long Arc Quad: AROM;Seated;Right;10 reps    General Comments        Pertinent Vitals/Pain Pain Assessment: Faces Pain Location: pt stated sore LLE but unable to rate or further localize    Home Living  Prior Function            PT Goals (current goals can now be found in the care plan section) Progress towards PT goals: Progressing toward goals    Frequency       PT Plan Current plan remains appropriate    Co-evaluation             End of Session Equipment  Utilized During Treatment: Gait belt;Left knee immobilizer Activity Tolerance: Patient tolerated treatment well Patient left: in chair;with call bell/phone within reach;with family/visitor present     Time: 1140-1206 PT Time Calculation (min) (ACUTE ONLY): 26 min  Charges:  $Therapeutic Activity: 23-37 mins                    G CodesMelford Aase 2014-08-16, 12:43 PM Elwyn Reach, [place]

## 2014-08-12 NOTE — Progress Notes (Signed)
Admit: 08/05/2014 LOS: 7  40F found down with L femur fracture, likely mechanical fall s/p ORIF 08/07/14 with Ao?CKD (BL SCr ?1.7).  PMH also with HTN, OA, Anemia  Subjective:  Spoke with daughter today Discussed her global health, challenges of recovery They wish to learn more about hospice, at home preferably GFR improving    12/07 0701 - 12/08 0700 In: -  Out: 4 [Stool:4]  Filed Weights   08/10/14 0259 08/11/14 0423 08/12/14 0500  Weight: 42.9 kg (94 lb 9.2 oz) 44.1 kg (97 lb 3.6 oz) 44.9 kg (98 lb 15.8 oz)    Scheduled Meds: . aspirin EC  325 mg Oral Q breakfast  . feeding supplement (ENSURE COMPLETE)  237 mL Oral BID BM  . ferrous sulfate  325 mg Oral TID WC  . heparin subcutaneous  5,000 Units Subcutaneous 3 times per day  . sodium chloride  3 mL Intravenous Q12H   Continuous Infusions: . sodium chloride 100 mL/hr at 08/12/14 1008   PRN Meds:.acetaminophen **OR** acetaminophen, HYDROcodone-acetaminophen, menthol-cetylpyridinium **OR** phenol, morphine injection, ondansetron (ZOFRAN) IV  Current Labs: reviewed    Physical Exam:  Blood pressure 171/58, pulse 79, temperature 98 F (36.7 C), temperature source Oral, resp. rate 18, height 5' (1.524 m), weight 44.9 kg (98 lb 15.8 oz), SpO2 95 %. NAD, in chair, somnolent RRR CTAB No LEE S/nt/nd EOMI   Assessment/Plan 1. AoCKD 1. No HN by Renal US 08/08/14 2. Foley placed 12/6, minimal UOP 3. Likely ATN related to hypotension 4. I don't think she will benefit from chronic dialysis 5. GFR improving 2. Distal L femur fracture, s/p ORIF 3. Anemia,  1. 08/05/14 TSAT 4 and Ferritin 14 2. Transfused 2u during admission 3. Placed on PO Fe 4. HTN 1. Reasonable control, no meds 5. Metabolic Acidosis 1. Begin NaHCO3 650 BID 2. Stop NS IVF 6. Goals of Care  1. Discussed likely outcomes, risks and potential for suffereing 2. They wish to look into home hospice  Pearson Grippe MD 08/12/2014, 1:00 PM   Recent  Labs Lab 08/10/14 0530 08/11/14 0840 08/12/14 1030  NA 137 139 141  K 4.2 4.9 5.0  CL 101 104 107  CO2 18* 15* 12*  GLUCOSE 114* 102* 123*  BUN 51* 62* 65*  CREATININE 4.09* 4.18* 3.87*  CALCIUM 7.9* 8.0* 8.4    Recent Labs Lab 08/08/14 0300 08/09/14 0442 08/10/14 0530  WBC 12.8* 11.2* 9.5  HGB 8.4* 8.1* 7.7*  HCT 26.4* 25.5* 24.2*  MCV 72.9* 73.1* 75.4*  PLT 341 295 291

## 2014-08-12 NOTE — Progress Notes (Addendum)
Progress Note from the Palliative Medicine Team at [place]: Alicia Guerra is lying in bed and has no complaints. Her niece Alicia Guerra and another niece are in the room. Alicia Guerra tells me that she spoke with Dr. Joelyn Oms - she is very appreciative of his candor and I appreciate his continued support and additions to palliative conversations with family. She tells me that they discussed hospice and we discussed what this looks like at home and in a facility. I am unsure that she will be a good candidate for hospice facility if her kidneys improve but I would recommend hospice at home if their goals are for more comfort focus and quality of life. We briefly discussed code status - and Alicia Guerra tells me she wishes to think about this. Alicia Guerra tells me that "God will take care of it." I will continue to follow and continue these discussions especially around expectations with hospice care. Again, the most important thing for Alicia Guerra is to be at home.     Objective: No Known Allergies Scheduled Meds: . aspirin EC  325 mg Oral Q breakfast  . feeding supplement (ENSURE COMPLETE)  237 mL Oral BID BM  . ferrous sulfate  325 mg Oral TID WC  . heparin subcutaneous  5,000 Units Subcutaneous 3 times per day  . sodium chloride  3 mL Intravenous Q12H   Continuous Infusions:  PRN Meds:.acetaminophen **OR** acetaminophen, HYDROcodone-acetaminophen, menthol-cetylpyridinium **OR** phenol, morphine injection, ondansetron (ZOFRAN) IV  BP 175/46 mmHg  Pulse 67  Temp(Src) 97.7 F (36.5 C) (Oral)  Resp 17  Ht 5' (1.524 m)  Wt 44.9 kg (98 lb 15.8 oz)  BMI 19.33 kg/m2  SpO2 94%   PPS: 30%   Intake/Output Summary (Last 24 hours) at 08/12/14 1541 Last data filed at 08/11/14 1700  Gross per 24 hour  Intake      0 ml  Output      1 ml  Net     -1 ml      LBM: 08/12/14  Physical Exam:  General: NAD, pleasant HEENT: [place]/AT, no JVD, moist mucous membranes Chest: CTA throughout, no labored  breathing, symmetric CVS: RRR, S1 S2 Abdomen: Soft, NT, ND, +BS Ext: MAE, no edema, warm to touch, left knee brace in place Neuro: Awake, alert, oriented x 3  Labs: CBC    Component Value Date/Time   WBC 9.5 08/10/2014 0530   RBC 3.21* 08/10/2014 0530   RBC 2.56* 08/05/2014 0940   HGB 7.7* 08/10/2014 0530   HCT 24.2* 08/10/2014 0530   PLT 291 08/10/2014 0530   MCV 75.4* 08/10/2014 0530   MCH 24.0* 08/10/2014 0530   MCHC 31.8 08/10/2014 0530   RDW 23.1* 08/10/2014 0530   LYMPHSABS 0.9 08/05/2014 0940   MONOABS 0.4 08/05/2014 0940   EOSABS 0.1 08/05/2014 0940   BASOSABS 0.0 08/05/2014 0940    BMET    Component Value Date/Time   NA 141 08/12/2014 1030   K 5.0 08/12/2014 1030   CL 107 08/12/2014 1030   CO2 12* 08/12/2014 1030   GLUCOSE 123* 08/12/2014 1030   BUN 65* 08/12/2014 1030   CREATININE 3.87* 08/12/2014 1030   CALCIUM 8.4 08/12/2014 1030   GFRNONAA 10* 08/12/2014 1030   GFRAA 11* 08/12/2014 1030    CMP     Component Value Date/Time   NA 141 08/12/2014 1030   K 5.0 08/12/2014 1030   CL 107 08/12/2014 1030   CO2 12* 08/12/2014 1030   GLUCOSE 123* 08/12/2014  1030   BUN 65* 08/12/2014 1030   CREATININE 3.87* 08/12/2014 1030   CALCIUM 8.4 08/12/2014 1030   PROT 6.9 08/05/2014 0940   ALBUMIN 3.6 08/05/2014 0940   AST 15 08/05/2014 0940   ALT 7 08/05/2014 0940   ALKPHOS 77 08/05/2014 0940   BILITOT 0.2* 08/05/2014 0940   GFRNONAA 10* 08/12/2014 1030   GFRAA 11* 08/12/2014 1030   Assessment and Plan: 1. Code Status: FULL 2. Symptom Control:  1. Decreased appetite: Recommend frequent small meals. Consider supplements such as Nepro or Ensure if she will take. 3. Psycho/Social: Emotional support provided to patient and family at bedside.  4. Spiritual: She is a very spiritual and religious person and pulls comfort from this part of her life.  5. Disposition: To be determined.   Patient Documents Completed or Given: Document Given Completed  Advanced  Directives Pkt    MOST    DNR    Gone from My Sight    Hard Choices yes     Time In Time Out Total Time Spent with Patient Total Overall Time  1500 1530 57min 52min    Greater than 50%  of this time was spent counseling and coordinating care related to the above assessment and plan.  Vinie Sill, NP Palliative Medicine Team Pager # [phone] (M-F 8a-5p) Team Phone # [phone] (Nights/Weekends)   1

## 2014-08-12 NOTE — Progress Notes (Signed)
Utilization review completed.  

## 2014-08-13 LAB — BASIC METABOLIC PANEL
Anion gap: 22 — ABNORMAL HIGH (ref 5–15)
BUN: 66 mg/dL — ABNORMAL HIGH (ref 6–23)
CALCIUM: 8.4 mg/dL (ref 8.4–10.5)
CO2: 11 mEq/L — ABNORMAL LOW (ref 19–32)
Chloride: 106 mEq/L (ref 96–112)
Creatinine, Ser: 3.77 mg/dL — ABNORMAL HIGH (ref 0.50–1.10)
GFR, EST AFRICAN AMERICAN: 11 mL/min — AB (ref >90)
GFR, EST NON AFRICAN AMERICAN: 10 mL/min — AB (ref >90)
GLUCOSE: 118 mg/dL — AB (ref 70–99)
POTASSIUM: 5.1 meq/L (ref 3.7–5.3)
SODIUM: 139 meq/L (ref 137–147)

## 2014-08-13 NOTE — Clinical Social Work Note (Signed)
Discussion with case manager who spoke to physician about patient's discharge.  Patient and family are thinking about having patient go to SNF with palliative care.  Faxed out information to facilities in [place] and [facility] awaiting for bed offers for patient.  Will inform family once bed offers have been given.  Alicia Guerra. [place], MSW, [place] 08/13/2014 3:56 PM

## 2014-08-13 NOTE — Progress Notes (Signed)
[facility] PROGRESS NOTE  Alicia Guerra [id number] DOB: 1923/12/12 DOA: 08/05/2014 PCP: [name], MD  brief narrative 80 year old female being independently prior to admission presented to [facility] ED after sustaining a fall when she slid down the steps and hit her left knee. Extraocular left knee none in the ED showed comminuted fracture of the distal femoral diaphysis with angulation and displacement at thie the fracture site. Patient transferred to [name] for orthopedic evaluation. Patient was also found to have a hemoglobin of 5.1 with guaiac-positive stools.  On day 2 of admission patient febrile, encephalopathic with increased leukocytosis and right lung pneumonia. Also has UTI. tolerated ORIF on 12/3. hospital course now complicated due to advanced oliguric renal failure.   Assessment/Plan: Advanced acute on chronic kidney disease No old labs in the system. FENa<1. Renal function progressively worsening with poor urine output.. Small kidneys on renal US.  Foley placed by urology on 12/6. Bladder scan with minimal urine. Unexplained worsening renal function possibly related to hypotension. remains oliguric. Given her age and debility, she is a poor HD candidate. palliative team discussing goals of care with patient and her neice. Niece understands patient cannot go home in this state . Will request social worker to evaluate for SNF.    SIRS with acute encephalopathy Secondary to community acquired versus aspiration pneumonia. now has mild uremic sx. urine culture grew Escherichia coli. blood cultures Negative. Completed empiric Rocephin and azithromycin. Seen by swallow eval and recommends dysphagia level I diet .   Left knee and displaced distal femoral metaphysis fracture -s/p ORIF on 12/3 .  Marland Kitchen Pain control with when necessary narcotics.    Microcytic anemia iron panel suggests iron deficiency. Patient guiac positive in the ED.  Hemoglobin improved with 2 u PRBC on admission.   Metabolic acidosis Secondary to progressive  kidney disease. Fluids switch to bicarbonate drip.   Severe Protein Calorie malnutrition Nutrition consult appreciated. Added ensure twice a day  DVT prophylaxis: sq heparin and full dose ASA  Code Status: Full code, palliative care discussion in progress.  Family Communication: Niece OVER THE PHONE Disposition Plan: Given progressively worsening renal function will likely need hospice. Will be determined in the next 24 hours. (Home versus residential hospice)  Consultants:  Ortho  Renal  Palliative care  Procedures:  ORIF left femur fracture on 12/3  Antibiotics:  rocephin and azithromycin 12/2--12/6  HPI/Subjective: Pt seen and examined this am. Denies any pain. Reports poor appetite. Niece at bedside   Objective: Filed Vitals:   08/13/14 0900  BP: 174/89  Pulse: 80  Temp: 97.6 F (36.4 C)  Resp: 17    Intake/Output Summary (Last 24 hours) at 08/13/14 1402 Last data filed at 08/13/14 1300  Gross per 24 hour  Intake    480 ml  Output    450 ml  Net     30 ml   Filed Weights   08/11/14 0423 08/12/14 0500 08/13/14 0451  Weight: 44.1 kg (97 lb 3.6 oz) 44.9 kg (98 lb 15.8 oz) 48 kg (105 lb 13.1 oz)    Exam:   General: Elderly thin built female in no acute distress  HEENT: Hoarse voice, moist oral mucosa  Chest: clear Breath sounds bilaterally  CVS: Normal S1 and S2, no murmurs  Abdomen: Soft, nondistended, nontender, bowel sounds present, Foley in place  Extremities: Warm, left knee brace  CNS: Alert and oriented  Data Reviewed: Basic Metabolic Panel:  Recent Labs Lab 08/08/14 0300 08/09/14 0442  08/10/14 0530 08/11/14 0840 08/12/14 1030 08/13/14 0324  NA 135* 133* 137 139 141 139  K 4.9 4.6 4.2 4.9 5.0 5.1  CL 102 98 101 104 107 106  CO2 14* 18* 18* 15* 12* 11*  GLUCOSE 108* 178* 114* 102* 123* 118*  BUN 29* 37* 51* 62* 65* 66*   CREATININE 2.01* 3.39* 4.09* 4.18* 3.87* 3.77*  CALCIUM 8.4 8.2* 7.9* 8.0* 8.4 8.4  MG 2.1  --   --   --   --   --    Liver Function Tests: No results for input(s): AST, ALT, ALKPHOS, BILITOT, PROT, ALBUMIN in the last 168 hours. No results for input(s): LIPASE, AMYLASE in the last 168 hours. No results for input(s): AMMONIA in the last 168 hours. CBC:  Recent Labs Lab 08/07/14 0324 08/08/14 0300 08/09/14 0442 08/10/14 0530  WBC 18.2* 12.8* 11.2* 9.5  HGB 9.5* 8.4* 8.1* 7.7*  HCT 30.0* 26.4* 25.5* 24.2*  MCV 72.5* 72.9* 73.1* 75.4*  PLT 422* 341 295 291   Cardiac Enzymes:  Recent Labs Lab 08/11/14 0840  CKTOTAL 266*   BNP (last 3 results) No results for input(s): PROBNP in the last 8760 hours. CBG: No results for input(s): GLUCAP in the last 168 hours.  Recent Results (from the past 240 hour(s))  Surgical pcr screen     Status: None   Collection Time: 08/06/14  2:00 PM  Result Value Ref Range Status   MRSA, PCR NEGATIVE NEGATIVE Final   Staphylococcus aureus NEGATIVE NEGATIVE Final    Comment:        The Xpert SA Assay (FDA approved for NASAL specimens in patients over 90 years of age), is one component of a comprehensive surveillance program.  Test performance has been validated by [employer] for patients greater than or equal to 54 year old. It is not intended to diagnose infection nor to guide or monitor treatment.   Culture, blood (routine x 2)     Status: None   Collection Time: 08/06/14  4:10 PM  Result Value Ref Range Status   Specimen Description BLOOD RIGHT HAND  Final   Special Requests BOTTLES DRAWN AEROBIC ONLY 3 CC  Final   Culture  Setup Time   Final    08/06/2014 21:17 Performed at [facility]    Culture   Final    NO GROWTH 5 DAYS Performed at [facility]    Report Status 08/12/2014 FINAL  Final  Culture, blood (routine x 2)     Status: None   Collection Time: 08/06/14  4:20 PM  Result Value Ref Range Status    Specimen Description BLOOD LEFT HAND  Final   Special Requests BOTTLES DRAWN AEROBIC ONLY 6 CC  Final   Culture  Setup Time   Final    08/06/2014 21:17 Performed at [facility]    Culture   Final    NO GROWTH 5 DAYS Performed at [facility]    Report Status 08/12/2014 FINAL  Final  Culture, Urine     Status: None   Collection Time: 08/07/14 12:01 PM  Result Value Ref Range Status   Specimen Description URINE, CATHETERIZED  Final   Special Requests NONE  Final   Culture  Setup Time   Final    08/07/2014 16:55 Performed at [place]   Final    55,000 COLONIES/ML Performed at [place]   Final  ESCHERICHIA COLI Performed at [facility]    Report Status 08/09/2014 FINAL  Final   Organism ID, Bacteria ESCHERICHIA COLI  Final      Susceptibility   Escherichia coli - MIC*    AMPICILLIN >=32 RESISTANT Resistant     CEFAZOLIN 8 SENSITIVE Sensitive     CEFTRIAXONE <=1 SENSITIVE Sensitive     CIPROFLOXACIN 0.5 SENSITIVE Sensitive     GENTAMICIN >=16 RESISTANT Resistant     LEVOFLOXACIN 1 SENSITIVE Sensitive     NITROFURANTOIN <=16 SENSITIVE Sensitive     TOBRAMYCIN 8 INTERMEDIATE Intermediate     TRIMETH/SULFA >=320 RESISTANT Resistant     PIP/TAZO <=4 SENSITIVE Sensitive     * ESCHERICHIA COLI  Clostridium Difficile by PCR     Status: None   Collection Time: 08/11/14  3:09 PM  Result Value Ref Range Status   C difficile by pcr NEGATIVE NEGATIVE Final     Studies: No results found.  Scheduled Meds: . aspirin EC  325 mg Oral Q breakfast  . feeding supplement (ENSURE COMPLETE)  237 mL Oral BID BM  . ferrous sulfate  325 mg Oral TID WC  . heparin subcutaneous  5,000 Units Subcutaneous 3 times per day  . sodium chloride  3 mL Intravenous Q12H   Continuous Infusions:       Time spent: 25 minutes    Ravonda Brecheen  [facility] Pager [phone] If 7PM-7AM, please contact  night-coverage at [email or web address], password [facility] 08/13/2014, 2:02 PM  LOS: 8 days

## 2014-08-13 NOTE — Progress Notes (Signed)
Medicare Important Message given? YES  (If response is "NO", the following Medicare IM given date fields will be blank)  Date Medicare IM given: 08/13/14 Medicare IM given by:  Nathaniel Yaden  

## 2014-08-13 NOTE — Progress Notes (Signed)
NUTRITION FOLLOW UP  DOCUMENTATION CODES Per approved criteria  -Severe malnutrition in the context of chronic illness   INTERVENTION:  Continue Ensure Complete po BID, each supplement provides 350 kcal and 13 grams of protein RD to follow for nutrition care plan  NUTRITION DIAGNOSIS: Increased nutrient needs related to malnutrition, repletion as evidenced by estimated nutrition needs, ongoing  Goal: Pt to meet >/= 90% of their estimated nutrition needs, unmet  Monitor:  PO & supplemental intake, weight, labs, I/O's  ASSESSMENT: 80 year old Female presented to [facility] to ED after sustaining a fall when she slid down the steps and hit her left knee. Extraocular left knee in the ED showed comminuted fracture of the distal femoral diaphysis with angulation and displacement at the fracture site.   Patient transferred to [name] for orthopedic evaluation. Patient was also found to have a hemoglobin of 5.1 with guaiac-positive stools. CXR concerning for new RLL PNA.  Patient s/p procedure 12/3: OPEN REDUCTION INTERNAL FIXATION (ORIF) LEFT FEMUR FRACTURE  Speech Path Treatment notes reviewed.  Pt currently on Dys 1, thin liquid diet.  PO intake variable at 0-75% per flowsheet records.  Noted pt refusing some meals.  Palliative Care Team following.  Pt is receiving Ensure Complete oral nutrition supplements BID.  Height: Ht Readings from Last 1 Encounters:  08/05/14 5' (1.524 m)    Weight: Wt Readings from Last 1 Encounters:  08/13/14 105 lb 13.1 oz (48 kg)    BMI:  Body mass index is 20.67 kg/(m^2).  Estimated Nutritional Needs: Kcal: 1400-1600 Protein: 60-70 gm Fluid: >/= 1.5 L  Skin: Intact  Diet Order: Dys 3, thin liquids   Intake/Output Summary (Last 24 hours) at 08/13/14 1212 Last data filed at 08/13/14 0900  Gross per 24 hour  Intake    240 ml  Output    450 ml  Net   -210 ml    Labs:   Recent Labs Lab 08/08/14 0300  08/11/14 0840 08/12/14 1030  08/13/14 0324  NA 135*  < > 139 141 139  K 4.9  < > 4.9 5.0 5.1  CL 102  < > 104 107 106  CO2 14*  < > 15* 12* 11*  BUN 29*  < > 62* 65* 66*  CREATININE 2.01*  < > 4.18* 3.87* 3.77*  CALCIUM 8.4  < > 8.0* 8.4 8.4  MG 2.1  --   --   --   --   GLUCOSE 108*  < > 102* 123* 118*  < > = values in this interval not displayed.  Scheduled Meds: . aspirin EC  325 mg Oral Q breakfast  . feeding supplement (ENSURE COMPLETE)  237 mL Oral BID BM  . ferrous sulfate  325 mg Oral TID WC  . heparin subcutaneous  5,000 Units Subcutaneous 3 times per day  . sodium chloride  3 mL Intravenous Q12H    Continuous Infusions:    Past Medical History  Diagnosis Date  . Hypertension   . Arthritis   . Anemia   . History of blood transfusion     "today is the first time" (08/05/2014)    Past Surgical History  Procedure Laterality Date  . Cataract extraction Left   . Tonsillectomy    . Appendectomy    . Cholecystectomy    . Cystectomy      "had cyst taken off lower back"  . Orif femur fracture Left 08/07/2014    Procedure: OPEN REDUCTION INTERNAL FIXATION (ORIF)  LEFT FEMUR  FRACTURE;  Surgeon: [name], MD;  Location: [facility];  Service: Orthopedics;  Laterality: Left;    Arthur Holms, RD, LDN Pager #: [phone] After-Hours Pager #: [phone]

## 2014-08-13 NOTE — Progress Notes (Signed)
Patient BP 176/76. Patient asymptomatic. MD paged. No answer received. Will continue to monitor.   Domingo Dimes RN

## 2014-08-13 NOTE — Progress Notes (Signed)
Patient had 15 beat run of Vtach. Patient resting in bed. Patient asymptomatic. MD made aware. No orders placed at this time. Will continue to monitor.   Domingo Dimes RN

## 2014-08-13 NOTE — Plan of Care (Signed)
Problem: Phase II Progression Outcomes Goal: Progress activity as tolerated unless otherwise ordered Outcome: Not Progressing Goal: Progressing with IS, TCDB Outcome: Progressing Goal: Surgical site without signs of infection Outcome: Progressing Goal: Return of bowel function (flatus, BM) IF ABDOMINAL SURGERY:  Outcome: Completed/Met Date Met:  08/13/14 Goal: Foley discontinued Outcome: Not Progressing Goal: Discharge plan established Outcome: Progressing Goal: Tolerating diet Outcome: Progressing

## 2014-08-14 LAB — BASIC METABOLIC PANEL
Anion gap: 18 — ABNORMAL HIGH (ref 5–15)
BUN: 63 mg/dL — ABNORMAL HIGH (ref 6–23)
CALCIUM: 8.9 mg/dL (ref 8.4–10.5)
CO2: 15 meq/L — AB (ref 19–32)
CREATININE: 2.91 mg/dL — AB (ref 0.50–1.10)
Chloride: 107 mEq/L (ref 96–112)
GFR calc Af Amer: 16 mL/min — ABNORMAL LOW (ref >90)
GFR calc non Af Amer: 13 mL/min — ABNORMAL LOW (ref >90)
Glucose, Bld: 140 mg/dL — ABNORMAL HIGH (ref 70–99)
Potassium: 4.6 mEq/L (ref 3.7–5.3)
Sodium: 140 mEq/L (ref 137–147)

## 2014-08-14 MED ORDER — HYDRALAZINE HCL 20 MG/ML IJ SOLN
5.0000 mg | Freq: Four times a day (QID) | INTRAMUSCULAR | Status: DC | PRN
  Administered 2014-08-14: 5 mg via INTRAVENOUS
  Filled 2014-08-14: qty 1

## 2014-08-14 MED ORDER — MIRTAZAPINE 7.5 MG PO TABS
7.5000 mg | ORAL_TABLET | Freq: Every day | ORAL | Status: DC
  Administered 2014-08-14: 7.5 mg via ORAL
  Filled 2014-08-14 (×2): qty 1

## 2014-08-14 MED ORDER — HYDRALAZINE HCL 20 MG/ML IJ SOLN
10.0000 mg | Freq: Once | INTRAMUSCULAR | Status: AC
  Administered 2014-08-14: 10 mg via INTRAVENOUS
  Filled 2014-08-14: qty 1

## 2014-08-14 NOTE — Progress Notes (Signed)
Progress Note from the Palliative Medicine Team at [place]: [name] is lying in bed and is very frustrated this morning. She is more confused. I have a very difficult time understanding her today as she is very short in her speech. She is drinking her coffee and asking for pain medication. Again tried to elicit what was bothering her but she would not say. I asked if she was okay going to a facility for short term and she says yes that this seems like a good idea. Pearl is not at bedside and I was unable to reach her by phone although I did not leave a message. I will follow up tomorrow.     Objective: No Known Allergies Scheduled Meds: . aspirin EC  325 mg Oral Q breakfast  . feeding supplement (ENSURE COMPLETE)  237 mL Oral BID BM  . ferrous sulfate  325 mg Oral TID WC  . heparin subcutaneous  5,000 Units Subcutaneous 3 times per day  . sodium chloride  3 mL Intravenous Q12H   Continuous Infusions:  PRN Meds:.acetaminophen **OR** acetaminophen, HYDROcodone-acetaminophen, menthol-cetylpyridinium **OR** phenol, morphine injection, ondansetron (ZOFRAN) IV  BP 173/63 mmHg  Pulse 84  Temp(Src) 98.7 F (37.1 C) (Oral)  Resp 20  Ht 5' (1.524 m)  Wt 47.8 kg (105 lb 6.1 oz)  BMI 20.58 kg/m2  SpO2 95%   PPS: 30% at best   Intake/Output Summary (Last 24 hours) at 08/14/14 1217 Last data filed at 08/13/14 1704  Gross per 24 hour  Intake    480 ml  Output    450 ml  Net     30 ml      LBM: 08/12/14  Physical Exam:  General: NAD, pleasant HEENT: [place]/AT, no JVD, moist mucous membranes Chest: CTA throughout, no labored breathing, symmetric CVS: RRR, S1 S2 Abdomen: Soft, NT, ND, +BS Ext: MAE, no edema, warm to touch, left knee brace in place Neuro: Awake, alert, oriented to person and place only   Labs: CBC    Component Value Date/Time   WBC 9.5 08/10/2014 0530   RBC 3.21* 08/10/2014 0530   RBC 2.56* 08/05/2014 0940   HGB 7.7* 08/10/2014 0530   HCT 24.2*  08/10/2014 0530   PLT 291 08/10/2014 0530   MCV 75.4* 08/10/2014 0530   MCH 24.0* 08/10/2014 0530   MCHC 31.8 08/10/2014 0530   RDW 23.1* 08/10/2014 0530   LYMPHSABS 0.9 08/05/2014 0940   MONOABS 0.4 08/05/2014 0940   EOSABS 0.1 08/05/2014 0940   BASOSABS 0.0 08/05/2014 0940    BMET    Component Value Date/Time   NA 140 08/14/2014 1057   K 4.6 08/14/2014 1057   CL 107 08/14/2014 1057   CO2 15* 08/14/2014 1057   GLUCOSE 140* 08/14/2014 1057   BUN 63* 08/14/2014 1057   CREATININE 2.91* 08/14/2014 1057   CALCIUM 8.9 08/14/2014 1057   GFRNONAA 13* 08/14/2014 1057   GFRAA 16* 08/14/2014 1057    CMP     Component Value Date/Time   NA 140 08/14/2014 1057   K 4.6 08/14/2014 1057   CL 107 08/14/2014 1057   CO2 15* 08/14/2014 1057   GLUCOSE 140* 08/14/2014 1057   BUN 63* 08/14/2014 1057   CREATININE 2.91* 08/14/2014 1057   CALCIUM 8.9 08/14/2014 1057   PROT 6.9 08/05/2014 0940   ALBUMIN 3.6 08/05/2014 0940   AST 15 08/05/2014 0940   ALT 7 08/05/2014 0940   ALKPHOS 77 08/05/2014 0940   BILITOT 0.2*  08/05/2014 0940   GFRNONAA 13* 08/14/2014 1057   GFRAA 16* 08/14/2014 1057     Assessment and Plan: 1. Code Status: FULL 2. Symptom Control: 1. Decreased appetite: Recommend frequent small meals. Continue Ensure. Will begin trial of Remeron beginning tonight. I was unable to speak with [facility] today as she was not at bedside and did not reach via phone.   3. Psycho/Social: Emotional support provided to patient and family at bedside.  4. Spiritual: She is a very spiritual and religious person and pulls comfort from this part of her life.  5. Disposition: SNF rehab with palliative to follow.   Time In Time Out Total Time Spent with Patient Total Overall Time  0935 0955 78min 22min    Greater than 50%  of this time was spent counseling and coordinating care related to the above assessment and plan.  Vinie Sill, NP Palliative Medicine Team Pager # [phone] (M-F  8a-5p) Team Phone # [phone] (Nights/Weekends)

## 2014-08-14 NOTE — Progress Notes (Signed)
   08/14/14 1000  Clinical Encounter Type  Visited With Patient and family together  Visit Type Initial;Spiritual support;Social support  Referral From Nurse  Stress Factors  Patient Stress Factors Exhausted;Financial concerns   Chaplain visited with patient briefly this morning. Chaplain was referred to patient via spiritual care consult. Patient said she was doing well today but also expressed being upset about something involving money. Patient was having difficulty speaking for more than a few moments and indicated that she would like to rest. Chaplain will follow up with patient at a later time. Gar Ponto, Chaplain  10:38 AM

## 2014-08-14 NOTE — Progress Notes (Addendum)
Progress Note from the Palliative Medicine Team at [place]: Alicia Guerra is lying in bed today with her niece at bedside and [place] comes in after I enter. She is not herself today and is clearly not happy and upset. I asked her what was bothering her but she will not answer me but shaking her head yes/no. She is the same towards [place]. I talked further with [place], Ms. [place], was listening about a plan for short term rehab which is now their desire. Pearl said that Ms. Chim's siblings were not keen on the idea of hospice and after further discussion they wish to try short term rehab first. We did discuss concern over Ms. Fults's very poor nutritional status and that if she will not eat she will not thrive or be able to rehab. Pearl understands and tells me that she will get her home after rehab or if she is not tolerating and that she is working a temp job but can stop and care for Ms. Sandi if needed to get her back home.     Objective: No Known Allergies Scheduled Meds: . aspirin EC  325 mg Oral Q breakfast  . feeding supplement (ENSURE COMPLETE)  237 mL Oral BID BM  . ferrous sulfate  325 mg Oral TID WC  . heparin subcutaneous  5,000 Units Subcutaneous 3 times per day  . sodium chloride  3 mL Intravenous Q12H   Continuous Infusions:  PRN Meds:.acetaminophen **OR** acetaminophen, HYDROcodone-acetaminophen, menthol-cetylpyridinium **OR** phenol, morphine injection, ondansetron (ZOFRAN) IV  BP 173/63 mmHg  Pulse 84  Temp(Src) 98.7 F (37.1 C) (Oral)  Resp 20  Ht 5' (1.524 m)  Wt 47.8 kg (105 lb 6.1 oz)  BMI 20.58 kg/m2  SpO2 95%   PPS: 30% at best   Intake/Output Summary (Last 24 hours) at 08/14/14 0831 Last data filed at 08/13/14 1704  Gross per 24 hour  Intake    720 ml  Output    450 ml  Net    270 ml      LBM: 08/13/14  Physical Exam:  General: NAD, pleasant HEENT: [place]/AT, no JVD, moist mucous membranes Chest: CTA throughout, no labored breathing,  symmetric CVS: RRR, S1 S2 Abdomen: Soft, NT, ND, +BS Ext: MAE, no edema, warm to touch, left knee brace in place Neuro: Awake, alert, oriented x 3  Labs: CBC    Component Value Date/Time   WBC 9.5 08/10/2014 0530   RBC 3.21* 08/10/2014 0530   RBC 2.56* 08/05/2014 0940   HGB 7.7* 08/10/2014 0530   HCT 24.2* 08/10/2014 0530   PLT 291 08/10/2014 0530   MCV 75.4* 08/10/2014 0530   MCH 24.0* 08/10/2014 0530   MCHC 31.8 08/10/2014 0530   RDW 23.1* 08/10/2014 0530   LYMPHSABS 0.9 08/05/2014 0940   MONOABS 0.4 08/05/2014 0940   EOSABS 0.1 08/05/2014 0940   BASOSABS 0.0 08/05/2014 0940    BMET    Component Value Date/Time   NA 139 08/13/2014 0324   K 5.1 08/13/2014 0324   CL 106 08/13/2014 0324   CO2 11* 08/13/2014 0324   GLUCOSE 118* 08/13/2014 0324   BUN 66* 08/13/2014 0324   CREATININE 3.77* 08/13/2014 0324   CALCIUM 8.4 08/13/2014 0324   GFRNONAA 10* 08/13/2014 0324   GFRAA 11* 08/13/2014 0324    CMP     Component Value Date/Time   NA 139 08/13/2014 0324   K 5.1 08/13/2014 0324   CL 106 08/13/2014 0324   CO2 11*  08/13/2014 0324   GLUCOSE 118* 08/13/2014 0324   BUN 66* 08/13/2014 0324   CREATININE 3.77* 08/13/2014 0324   CALCIUM 8.4 08/13/2014 0324   PROT 6.9 08/05/2014 0940   ALBUMIN 3.6 08/05/2014 0940   AST 15 08/05/2014 0940   ALT 7 08/05/2014 0940   ALKPHOS 77 08/05/2014 0940   BILITOT 0.2* 08/05/2014 0940   GFRNONAA 10* 08/13/2014 0324   GFRAA 11* 08/13/2014 0324    Assessment and Plan: 1. Code Status: FULL 2. Symptom Control: 1. Decreased appetite: Recommend frequent small meals. Continue Ensure. 3. Psycho/Social: Emotional support provided to patient and family at bedside.  4. Spiritual: She is a very spiritual and religious person and pulls comfort from this part of her life.  5. Disposition: SNF rehab with palliative to follow.      Time In Time Out Total Time Spent with Patient Total Overall Time  1540 1600 82min 23min    Greater  than 50%  of this time was spent counseling and coordinating care related to the above assessment and plan.   Vinie Sill, NP Palliative Medicine Team Pager # [phone] (M-F 8a-5p) Team Phone # [phone] (Nights/Weekends)  1

## 2014-08-14 NOTE — Progress Notes (Signed)
[facility] PROGRESS NOTE  Alicia Guerra [id number] DOB: June 06, 1924 DOA: 08/05/2014 PCP: [name], MD  brief narrative 80 year old female being independently prior to admission presented to [facility] ED after sustaining a fall when she slid down the steps and hit her left knee. Extraocular left knee none in the ED showed comminuted fracture of the distal femoral diaphysis with angulation and displacement at thie the fracture site. Patient transferred to [name] for orthopedic evaluation. Patient was also found to have a hemoglobin of 5.1 with guaiac-positive stools.  On day 2 of admission patient febrile, encephalopathic with increased leukocytosis and right lung pneumonia. Also has UTI. tolerated ORIF on 12/3. hospital course now complicated due to advanced oliguric renal failure. Her renal parameters are improving . Her last cratinine is 2.91. And her bicarb level is also improving.    Assessment/Plan: Advanced acute on chronic kidney disease No old labs in the system. FENa<1. Renal function started to improve. Small kidneys on renal US.  Foley placed by urology on 12/6. Bladder scan with minimal urine. Unexplained worsening renal function possibly related to hypotension. remains oliguric. Given her age and debility, she is a poor HD candidate. palliative team discussing goals of care with patient and her neice. Niece understands patient cannot go home in this state . Will request social worker to evaluate for SNF.    SIRS with acute encephalopathy Secondary to community acquired versus aspiration pneumonia. now has mild uremic sx. urine culture grew Escherichia coli. blood cultures Negative. Completed empiric Rocephin and azithromycin. Seen by swallow eval and recommends dysphagia level I diet .   Left knee and displaced distal femoral metaphysis fracture -s/p ORIF on 12/3 .  Marland Kitchen Pain control with when necessary narcotics.    Microcytic anemia iron  panel suggests iron deficiency. Patient guiac positive in the ED. Hemoglobin improved with 2 u PRBC on admission.   Metabolic acidosis Secondary to progressive  kidney disease. Improving.    Severe Protein Calorie malnutrition Nutrition consult appreciated. Added ensure twice a day  DVT prophylaxis: sq heparin and full dose ASA  Code Status: Full code, palliative care discussion in progress.  Family Communication: Niece OVER THE PHONE Disposition Plan: Given progressively worsening renal function will likely need hospice. Will be determined in the next 24 hours. (Home versus residential hospice)  Consultants:  Ortho  Renal  Palliative care  Procedures:  ORIF left femur fracture on 12/3  Antibiotics:  rocephin and azithromycin 12/2--12/6  HPI/Subjective: Pt seen and examined this am. Denies any pain. Reports poor appetite. None at bedside.    Objective: Filed Vitals:   08/14/14 0648  BP: 173/63  Pulse:   Temp:   Resp:     Intake/Output Summary (Last 24 hours) at 08/14/14 1603 Last data filed at 08/13/14 1704  Gross per 24 hour  Intake    240 ml  Output    450 ml  Net   -210 ml   Filed Weights   08/12/14 0500 08/13/14 0451 08/14/14 0500  Weight: 44.9 kg (98 lb 15.8 oz) 48 kg (105 lb 13.1 oz) 47.8 kg (105 lb 6.1 oz)    Exam:   General: Elderly thin built female in no acute distress  HEENT: Hoarse voice, moist oral mucosa  Chest: clear Breath sounds bilaterally  CVS: Normal S1 and S2, no murmurs  Abdomen: Soft, nondistended, nontender, bowel sounds present, Foley in place  Extremities: Warm, left knee brace  CNS: Alert and oriented  Data Reviewed: Basic Metabolic  Panel:  Recent Labs Lab 08/08/14 0300  08/10/14 0530 08/11/14 0840 08/12/14 1030 08/13/14 0324 08/14/14 1057  NA 135*  < > 137 139 141 139 140  K 4.9  < > 4.2 4.9 5.0 5.1 4.6  CL 102  < > 101 104 107 106 107  CO2 14*  < > 18* 15* 12* 11* 15*  GLUCOSE 108*  < > 114*  102* 123* 118* 140*  BUN 29*  < > 51* 62* 65* 66* 63*  CREATININE 2.01*  < > 4.09* 4.18* 3.87* 3.77* 2.91*  CALCIUM 8.4  < > 7.9* 8.0* 8.4 8.4 8.9  MG 2.1  --   --   --   --   --   --   < > = values in this interval not displayed. Liver Function Tests: No results for input(s): AST, ALT, ALKPHOS, BILITOT, PROT, ALBUMIN in the last 168 hours. No results for input(s): LIPASE, AMYLASE in the last 168 hours. No results for input(s): AMMONIA in the last 168 hours. CBC:  Recent Labs Lab 08/08/14 0300 08/09/14 0442 08/10/14 0530  WBC 12.8* 11.2* 9.5  HGB 8.4* 8.1* 7.7*  HCT 26.4* 25.5* 24.2*  MCV 72.9* 73.1* 75.4*  PLT 341 295 291   Cardiac Enzymes:  Recent Labs Lab 08/11/14 0840  CKTOTAL 266*   BNP (last 3 results) No results for input(s): PROBNP in the last 8760 hours. CBG: No results for input(s): GLUCAP in the last 168 hours.  Recent Results (from the past 240 hour(s))  Surgical pcr screen     Status: None   Collection Time: 08/06/14  2:00 PM  Result Value Ref Range Status   MRSA, PCR NEGATIVE NEGATIVE Final   Staphylococcus aureus NEGATIVE NEGATIVE Final    Comment:        The Xpert SA Assay (FDA approved for NASAL specimens in patients over 25 years of age), is one component of a comprehensive surveillance program.  Test performance has been validated by [employer] for patients greater than or equal to 20 year old. It is not intended to diagnose infection nor to guide or monitor treatment.   Culture, blood (routine x 2)     Status: None   Collection Time: 08/06/14  4:10 PM  Result Value Ref Range Status   Specimen Description BLOOD RIGHT HAND  Final   Special Requests BOTTLES DRAWN AEROBIC ONLY 3 CC  Final   Culture  Setup Time   Final    08/06/2014 21:17 Performed at [facility]    Culture   Final    NO GROWTH 5 DAYS Performed at [facility]    Report Status 08/12/2014 FINAL  Final  Culture, blood (routine x 2)     Status: None    Collection Time: 08/06/14  4:20 PM  Result Value Ref Range Status   Specimen Description BLOOD LEFT HAND  Final   Special Requests BOTTLES DRAWN AEROBIC ONLY 6 CC  Final   Culture  Setup Time   Final    08/06/2014 21:17 Performed at [facility]    Culture   Final    NO GROWTH 5 DAYS Performed at [facility]    Report Status 08/12/2014 FINAL  Final  Culture, Urine     Status: None   Collection Time: 08/07/14 12:01 PM  Result Value Ref Range Status   Specimen Description URINE, CATHETERIZED  Final   Special Requests NONE  Final   Culture  Setup Time  Final    08/07/2014 16:55 Performed at [place]   Final    55,000 COLONIES/ML Performed at [facility]    Culture   Final    ESCHERICHIA COLI Performed at [facility]    Report Status 08/09/2014 FINAL  Final   Organism ID, Bacteria ESCHERICHIA COLI  Final      Susceptibility   Escherichia coli - MIC*    AMPICILLIN >=32 RESISTANT Resistant     CEFAZOLIN 8 SENSITIVE Sensitive     CEFTRIAXONE <=1 SENSITIVE Sensitive     CIPROFLOXACIN 0.5 SENSITIVE Sensitive     GENTAMICIN >=16 RESISTANT Resistant     LEVOFLOXACIN 1 SENSITIVE Sensitive     NITROFURANTOIN <=16 SENSITIVE Sensitive     TOBRAMYCIN 8 INTERMEDIATE Intermediate     TRIMETH/SULFA >=320 RESISTANT Resistant     PIP/TAZO <=4 SENSITIVE Sensitive     * ESCHERICHIA COLI  Clostridium Difficile by PCR     Status: None   Collection Time: 08/11/14  3:09 PM  Result Value Ref Range Status   C difficile by pcr NEGATIVE NEGATIVE Final     Studies: No results found.  Scheduled Meds: . aspirin EC  325 mg Oral Q breakfast  . feeding supplement (ENSURE COMPLETE)  237 mL Oral BID BM  . ferrous sulfate  325 mg Oral TID WC  . heparin subcutaneous  5,000 Units Subcutaneous 3 times per day  . mirtazapine  7.5 mg Oral QHS  . sodium chloride  3 mL Intravenous Q12H   Continuous Infusions:       Time spent: 25  minutes    Ragena Fiola  [facility] Pager [phone] If 7PM-7AM, please contact night-coverage at [email or web address], password [facility] 08/14/2014, 4:03 PM  LOS: 9 days

## 2014-08-14 NOTE — Progress Notes (Signed)
Patient BP 173/127. She is anxious and restless. MD made aware. Orders placed. Will continue to monitor.  Domingo Dimes RN

## 2014-08-14 NOTE — Clinical Social Work Note (Addendum)
Tried to contact patient's niece [place] Car[place] about bed offers for patient to find out where she would like her to go since patient said she wanted CSW to talk to niece instead.  Left message for niece to call back awaiting call back.    2:45pm Patient's niece is at bedside spoke to her about bed offers.  Informed her of the choices available, and left list of bed offers with niece.  Asked niece to let CSW know by the end of the day where patient will be going. Patient and niece would like to go to [facility] patient's information was faxed out on December 8th and response of no female beds available received on December 9th.  Contacted [facility] today to confirm, still no female beds available.  3:45pm Spoke to patients niece in regards to bed offers.  Informed patient's niece that once a bed offer has been chosen, then CSW can go through the steps of getting authorization from [employer].  Informed patient's daughter that the physician is thinking about discharging patient tomorrow pending discharge orders, and she will have to have made a decision.    Jones Broom. [place], MSW, [place] 08/14/2014 3:56 PM

## 2014-08-15 DIAGNOSIS — R630 Anorexia: Secondary | ICD-10-CM

## 2014-08-15 LAB — BASIC METABOLIC PANEL
Anion gap: 16 — ABNORMAL HIGH (ref 5–15)
BUN: 58 mg/dL — AB (ref 6–23)
CO2: 15 meq/L — AB (ref 19–32)
CREATININE: 2.53 mg/dL — AB (ref 0.50–1.10)
Calcium: 8.5 mg/dL (ref 8.4–10.5)
Chloride: 110 mEq/L (ref 96–112)
GFR calc Af Amer: 18 mL/min — ABNORMAL LOW (ref >90)
GFR calc non Af Amer: 16 mL/min — ABNORMAL LOW (ref >90)
Glucose, Bld: 90 mg/dL (ref 70–99)
Potassium: 5 mEq/L (ref 3.7–5.3)
Sodium: 141 mEq/L (ref 137–147)

## 2014-08-15 MED ORDER — FERROUS SULFATE 325 (65 FE) MG PO TABS
325.0000 mg | ORAL_TABLET | Freq: Three times a day (TID) | ORAL | Status: DC
Start: 2014-08-15 — End: 2014-08-25

## 2014-08-15 MED ORDER — MIRTAZAPINE 7.5 MG PO TABS: 7.5000 mg | ORAL_TABLET | Freq: Every day | ORAL | Status: DC

## 2014-08-15 MED ORDER — ASPIRIN 325 MG PO TBEC: 325.0000 mg | DELAYED_RELEASE_TABLET | Freq: Every day | ORAL | Status: DC

## 2014-08-15 MED ORDER — HYDROCODONE-ACETAMINOPHEN 5-325 MG PO TABS: 1.0000 | ORAL_TABLET | Freq: Four times a day (QID) | ORAL | Status: DC | PRN

## 2014-08-15 MED ORDER — ENSURE COMPLETE PO LIQD: 237.0000 mL | Freq: Two times a day (BID) | ORAL | Status: DC

## 2014-08-15 NOTE — Clinical Social Work Note (Signed)
Received a phone call from [place] that there is a bed available now.  Informed patient's niece, who said that is great, and that is patient's request.  Contacted [facility] to confirm patient wanted bed.  Patient to be d/c'ed today to [facility].  Patient and family agreeable to plans will transport via ems RN to call report.  Patient's niece was happy that patient is able to go where she wanted to go.  Jones Broom. [place], MSW, [place] 08/15/2014 2:17 PM

## 2014-08-15 NOTE — Progress Notes (Signed)
Spoke to Dr. Karleen Hampshire about foley catheter. Order to leave catheter in for discharge to SNF. Monitoring will continue.

## 2014-08-15 NOTE — Care Management Note (Signed)
    Page 1 of 1   08/15/2014     2:50:49 PM CARE MANAGEMENT NOTE 08/15/2014  Patient:  MAGDA, BOLENBAUGH   Account Number:  [id number]  Date Initiated:  08/11/2014  Documentation initiated by:  [facility]  Subjective/Objective Assessment:   mechanical fall s/p ORIF 08/07/14     Action/Plan:   SNF   Anticipated DC Date:  08/15/2014   Anticipated DC Plan:  SKILLED NURSING FACILITY  In-house referral  Clinical Social Worker      DC Planning Services  CM consult      Choice offered to / List presented to:             Status of service:  Completed, signed off Medicare Important Message given?  YES (If response is "NO", the following Medicare IM given date fields will be blank) Date Medicare IM given:  08/11/2014 Medicare IM given by:  [facility] Date Additional Medicare IM given:  08/13/2014 Additional Medicare IM given by:  Marvetta Gibbons  Discharge Disposition:  [place]  Per UR Regulation:  Reviewed for med. necessity/level of care/duration of stay  If discussed at [place] of Stay Meetings, dates discussed:   08/14/2014    Comments:  08/15/14- 1200- Marvetta Gibbons RN, BSN [phone] Pt for d/c to SNF today, CSW following for placement needs  08/12/14- 1600- Marvetta Gibbons RN, BSN [phone] Pt renal function worsening- poor prognosis- CSW following for potential residential hospice  08/11/2014 1400 NCM spoke to pt and plan is dc to SNF. Niece, [place] plans to do POA. Jonnie Finner RN CCM Case Mgmt phone [phone]

## 2014-08-15 NOTE — Progress Notes (Signed)
Pt discharged to [facility] Discharge instructions given to EMS IV dc'd  Tele dc'd  Foley remains per MD request Pt discharged via EMS All pt belongs at side Kathleen Argue S 5:00 PM

## 2014-08-15 NOTE — Progress Notes (Signed)
PT Cancellation Note  Patient Details Name: Alicia Guerra MRN: [id number] DOB: [birth year]   Cancelled Treatment:    Reason Eval/Treat Not Completed: Patient declined, no reason specified  Pt and family member state they are waiting for transfer to SNF. Decline working with PT at the moment. Requests RN be notified for pain medication. RN notified.   [name], [name]  [name] 08/15/2014, 4:32 PM

## 2014-08-15 NOTE — Progress Notes (Signed)
Alicia Guerra is transitioning to SNF rehab today. Nieces are at bedside. Alicia Guerra is extremely sleepy today and not awake as she usually is - I will d/c remeron in case this is an effect on her. Discussed this with family at bedside. Also further discussed a plan if rehab does not go well for home with [facility] and possibly hospice. They will continue to discuss and consider code status - full code for now.  Vinie Sill, NP Palliative Medicine Team Pager # [phone] (M-F 8a-5p) Team Phone # [phone] (Nights/Weekends)

## 2014-08-15 NOTE — Discharge Summary (Addendum)
Physician Discharge Summary  Alicia Guerra [id number] DOB: [birth year] DOA: 08/05/2014  PCP: [name], MD  Admit date: 08/05/2014 Discharge date: 08/15/2014  Time spent: 30 minutes  Recommendations for Outpatient Follow-up:  1. Follow upwith PCP in one week.  2. Follow up with BMP on Monday to check renal function 3. Follow up with orthopedics as recommended 4. Follow up with palliative care services at the facility.   Discharge Diagnoses:  Active Problems:   Knee fracture   Blood loss anemia   Dehydration   Protein-calorie malnutrition, severe   CAP (community acquired pneumonia)   UTI (urinary tract infection)   Acute encephalopathy   Acute kidney injury   Metabolic acidosis   Left femoral shaft fracture   Anemia, iron deficiency   Palliative care encounter   Decreased appetite   Discharge Condition: improved  Diet recommendation: low sodium diet  Filed Weights   08/13/14 0451 08/14/14 0500 08/15/14 0517  Weight: 48 kg (105 lb 13.1 oz) 47.8 kg (105 lb 6.1 oz) 46.8 kg (103 lb 2.8 oz)    History of present illness:  80 year old female being independently prior to admission presented to [facility] ED after sustaining a fall when she slid down the steps and hit her left knee. Extraocular left knee none in the ED showed comminuted fracture of the distal femoral diaphysis with angulation and displacement at thie the fracture site. Patient transferred to [name] for orthopedic evaluation. Patient was also found to have a hemoglobin of 5.1 with guaiac-positive stools.  On day 2 of admission patient febrile, encephalopathic with increased leukocytosis and right lung pneumonia. Also has UTI. tolerated ORIF on 12/3. hospital course now complicated due to advanced oliguric renal failure. Her renal parameters are improving . Her last cratinine is 2.5. And her bicarb level is also improving. She also completed the treatement for UTI.  Recommend follow up with  BMP on Monday. Follow up with ortho and Pcp as recommended.   Hospital Course:  Advanced acute on chronic kidney disease No old labs in the system. FENa<1. Renal function started to improve. Small kidneys on renal US.  Foley placed by urology on 12/6. Bladder scan with minimal urine. Unexplained worsening renal function possibly related to hypotension. remains oliguric. Given her age and debility, she is a poor HD candidate. palliative team discussing goals of care with patient and her neice. Niece understands patient cannot go home in this state . Requestedsocial worker to evaluate for SNF.  Plan for [facility] today. Since she was oliguric, a foley catheter was placed and urine output was recorded. Now as her urine output is improving, recommend taking the foley out when her renal function improves further!  SIRS with acute encephalopathy Secondary to community acquired versus aspiration pneumonia. now has mild uremic sx. urine culture grew Escherichia coli. blood cultures Negative. Completed empiric Rocephin and azithromycin. Seen by swallow eval and recommends dysphagia level I diet .   Left knee and displaced distal femoral metaphysis fracture -s/p ORIF on 12/3 . Marland Kitchen Pain control with when necessary narcotics.    Microcytic anemia iron panel suggests iron deficiency. Patient guiac positive in the ED. Hemoglobin improved with 2 u PRBC on admission.   Metabolic acidosis Secondary to progressive kidney disease. Improving.    Severe Protein Calorie malnutrition Nutrition consult appreciated. Added ensure twice a day  Procedures:  ORIF left femur fracture on 12/3  Consultations:  Renal  Orthopedics  Palliative care  Physical therapy.   Discharge  Exam: Filed Vitals:   08/15/14 0800  BP:   Pulse:   Temp:   Resp: 20    General: alert afebrile comfortable Cardiovascular: s1s2 Respiratory: ctab  Discharge Instructions You were cared for by a hospitalist  during your hospital stay. If you have any questions about your discharge medications or the care you received while you were in the hospital after you are discharged, you can call the unit and asked to speak with the hospitalist on call if the hospitalist that took care of you is not available. Once you are discharged, your primary care physician will handle any further medical issues. Please note that NO REFILLS for any discharge medications will be authorized once you are discharged, as it is imperative that you return to your primary care physician (or establish a relationship with a primary care physician if you do not have one) for your aftercare needs so that they can reassess your need for medications and monitor your lab values.  Discharge Instructions    Diet - low sodium heart healthy    Complete by:  As directed      Discharge instructions    Complete by:  As directed   Follow up with palliative care services as recommended Follow up with Basic metabolic panel on Monday.  Follow up with PCP as needed.          Current Discharge Medication List    START taking these medications   Details  aspirin EC 325 MG EC tablet Take 1 tablet (325 mg total) by mouth daily with breakfast. Qty: 30 tablet, Refills: 0    feeding supplement, ENSURE COMPLETE, (ENSURE COMPLETE) LIQD Take 237 mLs by mouth 2 (two) times daily between meals.    ferrous sulfate 325 (65 FE) MG tablet Take 1 tablet (325 mg total) by mouth 3 (three) times daily with meals. Refills: 3    HYDROcodone-acetaminophen (NORCO/VICODIN) 5-325 MG per tablet Take 1-2 tablets by mouth every 6 (six) hours as needed for moderate pain. Qty: 16 tablet, Refills: 0    mirtazapine (REMERON) 7.5 MG tablet Take 1 tablet (7.5 mg total) by mouth at bedtime.       No Known Allergies Follow-up Information    Follow up with MURPHY, TIMOTHY, D, MD In 1 week.   Specialty:  Orthopedic Surgery   Contact information:   [address] [place] [address] [phone]       Follow up with [name],EDWARD L, MD. Schedule an appointment as soon as possible for a visit in 1 week.   Specialty:  Pulmonary Disease   Contact information:   [address] [place] [address] [phone]        The results of significant diagnostics from this hospitalization (including imaging, microbiology, ancillary and laboratory) are listed below for reference.    Significant Diagnostic Studies: Dg Knee 1-2 Views Left  2014/08/22   CLINICAL DATA:  ORIF distal left femur  EXAM: DG C-ARM 61-120 MIN; LEFT KNEE - 1-2 VIEW  FLUOROSCOPY TIME:  30 seconds  COMPARISON:  None  FINDINGS: Comminuted distal femoral diametaphyseal fracture transfixed by a lateral sideplate with multiple interlocking screws. The fracture is near anatomic alignment. There are postsurgical changes in the surrounding soft tissues.  There is peripheral vascular atherosclerotic disease.  IMPRESSION: ORIF distal left femoral fracture.   Electronically Signed   By: Kathreen Devoid   On: 08-22-14 17:59   Dg Knee 1-2 Views Left  08/05/2014  CLINICAL DATA:  Patient fell down steps  EXAM: LEFT KNEE - 1-2 VIEW  COMPARISON:  None.  FINDINGS: Frontal and lateral views were obtained. There is a comminuted fracture of the distal femoral metaphysis with posterior angulation and displacement of the distal major fracture fragment with respect proximal fragment. No other fractures. No frank dislocation. There is mild narrowing of the patellofemoral joint. There is extensive arterial vascular calcification.  IMPRESSION: Comminuted fracture distal femoral metaphysis with angulation and displacement at the fracture site. No dislocation.   Electronically Signed   By: Lowella Grip M.D.   On: 08/05/2014 08:54   Ct Knee Left Wo Contrast  08/06/2014   CLINICAL DATA:  Status post fall, left femoral fracture  EXAM: CT OF THE LEFT KNEE WITHOUT CONTRAST  TECHNIQUE:  Multidetector CT imaging of the LEFT knee was performed according to the standard protocol. Multiplanar CT image reconstructions were also generated.  COMPARISON:  None.  FINDINGS: There is a comminuted and impacted distal femoral metaphysis fracture. The fracture cleft extends to the trochlear groove and intercondylar notch. There is a fracture cleft at the a ACL origin. There is approximately 12.5 mm of diastases at the trochlea. There is apex anterior angulation. There is mild apex medial angulation. There is no other fracture or dislocation. There is no significant joint effusion.  There is no soft tissue mass, hematoma or fluid collection. There is mild edema in the popliteal fossa. There is peripheral vascular atherosclerotic disease.  IMPRESSION: Comminuted, displaced, angulated and impacted distal femoral metaphysis fracture as described above.   Electronically Signed   By: Kathreen Devoid   On: 08/06/2014 07:59   US Renal  08/08/2014   CLINICAL DATA:  Acute kidney injury.  EXAM: RENAL/URINARY TRACT ULTRASOUND COMPLETE  COMPARISON:  None.  FINDINGS: Right Kidney:  Length: 7.8 cm. Parenchymal echogenicity is increased. An anechoic or nearly anechoic lesion off the upper pole measures 1.1 x 1.3 x 0.9 cm, most consistent with a cyst. No hydronephrosis.  Left Kidney:  Length: 8.8 cm. Parenchymal echogenicity is increased. An anechoic or nearly anechoic lesion in the region of the junction of the mid and lower poles measures 1.1 x 1.1 x 0.8 cm, also most consistent with a cyst. No hydronephrosis.  Bladder:  Foley catheter is seen in a decompressed bladder.  IMPRESSION: 1. No acute findings. 2. Increased renal parenchymal echogenicity is indicative of chronic medical renal disease.   Electronically Signed   By: Lorin Picket M.D.   On: 08/08/2014 14:13   Dg Pelvis Portable  08/05/2014   CLINICAL DATA:  Distal right femur fracture secondary to a fall down steps today.  EXAM: PORTABLE PELVIS 1-2 VIEWS   COMPARISON:  None.  FINDINGS: There is no evidence of pelvic fracture or diastasis. No pelvic bone lesions are seen. Calcified fibroid in the uterus. Bowel gas pattern is normal.  IMPRESSION: Normal pelvic bones.  No acute abnormalities.   Electronically Signed   By: Rozetta Nunnery M.D.   On: 08/05/2014 09:57   Dg Chest Port 1 View  08/06/2014   CLINICAL DATA:  Fever.  Subsequent encounter.  EXAM: PORTABLE CHEST - 1 VIEW  COMPARISON:  08/05/2014.  FINDINGS: Development of RIGHT perihilar airspace disease. This appears to involve all lobes of the RIGHT lung. In a patient with fever, this is suspicious for multifocal pneumonia. Aspiration pneumonitis or asymmetric pulmonary edema are less likely.  The cardiopericardial silhouette is within normal limits. The LEFT lung appears unchanged, without airspace disease.  No effusion. Monitoring leads project over the chest.  Aortic arch atherosclerosis. Asymmetric pleural apical thickening is present, greater on the LEFT than RIGHT.  IMPRESSION: Development of RIGHT perihilar airspace disease favored to represent pneumonia.   Electronically Signed   By: Dereck Ligas M.D.   On: 08/06/2014 10:29   Dg Chest Portable 1 View  08/05/2014   CLINICAL DATA:  Pre operative respiratory exam. Distal left femur fracture.  EXAM: PORTABLE CHEST - 1 VIEW  COMPARISON:  None.  FINDINGS: Heart size is within normal limits considering the AP portable technique. There is slight pulmonary vascular prominence. Slight thickening of the minor fissure on the right. Minimal atelectasis or scarring at the left lung base. Old healed left eighth rib fracture posterior laterally.  The L1 vertebra is slightly sclerotic. This may be due to a prior fracture.  IMPRESSION: Slight pulmonary vascular prominence. Minimal scarring or atelectasis at the left base.   Electronically Signed   By: Rozetta Nunnery M.D.   On: 08/05/2014 10:04   Dg Knee Left Port  08/08/2014   CLINICAL DATA:  Internal fixation of  femur fracture.  EXAM: PORTABLE LEFT KNEE - 1-2 VIEW  COMPARISON:  08/05/2014  FINDINGS: Plate and screw fixation traversing a distal femoral fracture is noted, in near-anatomic alignment and position.  No complicating features are identified.  IMPRESSION: Internal fixation of distal femur fracture, in near-anatomic alignment and position.   Electronically Signed   By: Hassan Rowan M.D.   On: 08/08/2014 02:47   Dg C-arm 1-60 Min  08/07/2014   CLINICAL DATA:  ORIF distal left femur  EXAM: DG C-ARM 61-120 MIN; LEFT KNEE - 1-2 VIEW  FLUOROSCOPY TIME:  30 seconds  COMPARISON:  None  FINDINGS: Comminuted distal femoral diametaphyseal fracture transfixed by a lateral sideplate with multiple interlocking screws. The fracture is near anatomic alignment. There are postsurgical changes in the surrounding soft tissues.  There is peripheral vascular atherosclerotic disease.  IMPRESSION: ORIF distal left femoral fracture.   Electronically Signed   By: Kathreen Devoid   On: 08/07/2014 17:59    Microbiology: Recent Results (from the past 240 hour(s))  Surgical pcr screen     Status: None   Collection Time: 08/06/14  2:00 PM  Result Value Ref Range Status   MRSA, PCR NEGATIVE NEGATIVE Final   Staphylococcus aureus NEGATIVE NEGATIVE Final    Comment:        The Xpert SA Assay (FDA approved for NASAL specimens in patients over 54 years of age), is one component of a comprehensive surveillance program.  Test performance has been validated by [employer] for patients greater than or equal to 100 year old. It is not intended to diagnose infection nor to guide or monitor treatment.   Culture, blood (routine x 2)     Status: None   Collection Time: 08/06/14  4:10 PM  Result Value Ref Range Status   Specimen Description BLOOD RIGHT HAND  Final   Special Requests BOTTLES DRAWN AEROBIC ONLY 3 CC  Final   Culture  Setup Time   Final    08/06/2014 21:17 Performed at [facility]    Culture   Final    NO  GROWTH 5 DAYS Performed at [facility]    Report Status 08/12/2014 FINAL  Final  Culture, blood (routine x 2)     Status: None   Collection Time: 08/06/14  4:20 PM  Result Value Ref Range Status   Specimen Description BLOOD  LEFT HAND  Final   Special Requests BOTTLES DRAWN AEROBIC ONLY 6 CC  Final   Culture  Setup Time   Final    08/06/2014 21:17 Performed at [facility]    Culture   Final    NO GROWTH 5 DAYS Performed at [facility]    Report Status 08/12/2014 FINAL  Final  Culture, Urine     Status: None   Collection Time: 08/07/14 12:01 PM  Result Value Ref Range Status   Specimen Description URINE, CATHETERIZED  Final   Special Requests NONE  Final   Culture  Setup Time   Final    08/07/2014 16:55 Performed at [facility]   Final    55,000 COLONIES/ML Performed at [facility]    Culture   Final    ESCHERICHIA COLI Performed at [facility]    Report Status 08/09/2014 FINAL  Final   Organism ID, Bacteria ESCHERICHIA COLI  Final      Susceptibility   Escherichia coli - MIC*    AMPICILLIN >=32 RESISTANT Resistant     CEFAZOLIN 8 SENSITIVE Sensitive     CEFTRIAXONE <=1 SENSITIVE Sensitive     CIPROFLOXACIN 0.5 SENSITIVE Sensitive     GENTAMICIN >=16 RESISTANT Resistant     LEVOFLOXACIN 1 SENSITIVE Sensitive     NITROFURANTOIN <=16 SENSITIVE Sensitive     TOBRAMYCIN 8 INTERMEDIATE Intermediate     TRIMETH/SULFA >=320 RESISTANT Resistant     PIP/TAZO <=4 SENSITIVE Sensitive     * ESCHERICHIA COLI  Clostridium Difficile by PCR     Status: None   Collection Time: 08/11/14  3:09 PM  Result Value Ref Range Status   C difficile by pcr NEGATIVE NEGATIVE Final     Labs: Basic Metabolic Panel:  Recent Labs Lab 08/11/14 0840 08/12/14 1030 08/13/14 0324 08/14/14 1057 08/15/14 0505  NA 139 141 139 140 141  K 4.9 5.0 5.1 4.6 5.0  CL 104 107 106 107 110  CO2 15* 12* 11* 15* 15*  GLUCOSE 102*  123* 118* 140* 90  BUN 62* 65* 66* 63* 58*  CREATININE 4.18* 3.87* 3.77* 2.91* 2.53*  CALCIUM 8.0* 8.4 8.4 8.9 8.5   Liver Function Tests: No results for input(s): AST, ALT, ALKPHOS, BILITOT, PROT, ALBUMIN in the last 168 hours. No results for input(s): LIPASE, AMYLASE in the last 168 hours. No results for input(s): AMMONIA in the last 168 hours. CBC:  Recent Labs Lab 08/09/14 0442 08/10/14 0530  WBC 11.2* 9.5  HGB 8.1* 7.7*  HCT 25.5* 24.2*  MCV 73.1* 75.4*  PLT 295 291   Cardiac Enzymes:  Recent Labs Lab 08/11/14 0840  CKTOTAL 266*   BNP: BNP (last 3 results) No results for input(s): PROBNP in the last 8760 hours. CBG: No results for input(s): GLUCAP in the last 168 hours.     SignedHosie Poisson  [facility] 08/15/2014, 12:07 PM

## 2014-08-15 NOTE — Progress Notes (Signed)
Utilization review completed.  

## 2014-08-16 ENCOUNTER — Inpatient Hospital Stay
Admission: RE | Admit: 2014-08-16 | Discharge: 2014-08-21 | Disposition: A | Discharge status: Other Acute Inpatient Hospital | Payer: Medicare HMO | Source: Ambulatory Visit | Attending: Pulmonary Disease | Admitting: Pulmonary Disease

## 2014-08-21 ENCOUNTER — Encounter (HOSPITAL_COMMUNITY): Payer: Self-pay | Admitting: Emergency Medicine

## 2014-08-21 ENCOUNTER — Inpatient Hospital Stay (HOSPITAL_COMMUNITY)
Admission: EM | Admit: 2014-08-21 | Discharge: 2014-08-25 | DRG: 377 | Disposition: A | Discharge status: Skilled Nursing Facility | Payer: Medicare HMO | Attending: Internal Medicine | Admitting: Internal Medicine

## 2014-08-21 DIAGNOSIS — I129 Hypertensive chronic kidney disease with stage 1 through stage 4 chronic kidney disease, or unspecified chronic kidney disease: Secondary | ICD-10-CM | POA: Diagnosis present

## 2014-08-21 DIAGNOSIS — E43 Unspecified severe protein-calorie malnutrition: Secondary | ICD-10-CM | POA: Diagnosis present

## 2014-08-21 DIAGNOSIS — Z9049 Acquired absence of other specified parts of digestive tract: Secondary | ICD-10-CM | POA: Diagnosis present

## 2014-08-21 DIAGNOSIS — Z87891 Personal history of nicotine dependence: Secondary | ICD-10-CM

## 2014-08-21 DIAGNOSIS — K625 Hemorrhage of anus and rectum: Secondary | ICD-10-CM

## 2014-08-21 DIAGNOSIS — K921 Melena: Secondary | ICD-10-CM | POA: Diagnosis present

## 2014-08-21 DIAGNOSIS — Z681 Body mass index (BMI) 19 or less, adult: Secondary | ICD-10-CM

## 2014-08-21 DIAGNOSIS — D500 Iron deficiency anemia secondary to blood loss (chronic): Secondary | ICD-10-CM | POA: Diagnosis present

## 2014-08-21 DIAGNOSIS — D62 Acute posthemorrhagic anemia: Secondary | ICD-10-CM | POA: Diagnosis present

## 2014-08-21 DIAGNOSIS — I10 Essential (primary) hypertension: Secondary | ICD-10-CM

## 2014-08-21 DIAGNOSIS — K222 Esophageal obstruction: Secondary | ICD-10-CM | POA: Diagnosis present

## 2014-08-21 DIAGNOSIS — Z7982 Long term (current) use of aspirin: Secondary | ICD-10-CM

## 2014-08-21 DIAGNOSIS — M1990 Unspecified osteoarthritis, unspecified site: Secondary | ICD-10-CM | POA: Diagnosis present

## 2014-08-21 DIAGNOSIS — N189 Chronic kidney disease, unspecified: Secondary | ICD-10-CM | POA: Diagnosis present

## 2014-08-21 DIAGNOSIS — K449 Diaphragmatic hernia without obstruction or gangrene: Secondary | ICD-10-CM | POA: Diagnosis present

## 2014-08-21 DIAGNOSIS — K922 Gastrointestinal hemorrhage, unspecified: Secondary | ICD-10-CM | POA: Diagnosis present

## 2014-08-21 DIAGNOSIS — D509 Iron deficiency anemia, unspecified: Secondary | ICD-10-CM | POA: Diagnosis present

## 2014-08-21 DIAGNOSIS — N179 Acute kidney failure, unspecified: Secondary | ICD-10-CM | POA: Diagnosis present

## 2014-08-21 LAB — COMPREHENSIVE METABOLIC PANEL
ALBUMIN: 2.7 g/dL — AB (ref 3.5–5.2)
ALT: 54 U/L — ABNORMAL HIGH (ref 0–35)
AST: 47 U/L — ABNORMAL HIGH (ref 0–37)
Alkaline Phosphatase: 144 U/L — ABNORMAL HIGH (ref 39–117)
Anion gap: 13 (ref 5–15)
BUN: 21 mg/dL (ref 6–23)
CO2: 21 mEq/L (ref 19–32)
CREATININE: 1.78 mg/dL — AB (ref 0.50–1.10)
Calcium: 8.7 mg/dL (ref 8.4–10.5)
Chloride: 110 mEq/L (ref 96–112)
GFR calc non Af Amer: 24 mL/min — ABNORMAL LOW (ref >90)
GFR, EST AFRICAN AMERICAN: 28 mL/min — AB (ref >90)
GLUCOSE: 123 mg/dL — AB (ref 70–99)
Potassium: 5 mEq/L (ref 3.7–5.3)
Sodium: 144 mEq/L (ref 137–147)
TOTAL PROTEIN: 5.6 g/dL — AB (ref 6.0–8.3)
Total Bilirubin: 0.2 mg/dL — ABNORMAL LOW (ref 0.3–1.2)

## 2014-08-21 LAB — CBC WITH DIFFERENTIAL/PLATELET
BASOS ABS: 0 10*3/uL (ref 0.0–0.1)
Basophils Relative: 0 % (ref 0–1)
EOS ABS: 0.1 10*3/uL (ref 0.0–0.7)
Eosinophils Relative: 1 % (ref 0–5)
HEMATOCRIT: 23.2 % — AB (ref 36.0–46.0)
Hemoglobin: 7 g/dL — ABNORMAL LOW (ref 12.0–15.0)
Lymphocytes Relative: 7 % — ABNORMAL LOW (ref 12–46)
Lymphs Abs: 0.6 10*3/uL — ABNORMAL LOW (ref 0.7–4.0)
MCH: 24.1 pg — AB (ref 26.0–34.0)
MCHC: 30.2 g/dL (ref 30.0–36.0)
MCV: 80 fL (ref 78.0–100.0)
MONOS PCT: 7 % (ref 3–12)
Monocytes Absolute: 0.6 10*3/uL (ref 0.1–1.0)
Neutro Abs: 7.7 10*3/uL (ref 1.7–7.7)
Neutrophils Relative %: 85 % — ABNORMAL HIGH (ref 43–77)
Platelets: ADEQUATE 10*3/uL (ref 150–400)
RBC: 2.9 MIL/uL — ABNORMAL LOW (ref 3.87–5.11)
RDW: 26.2 % — AB (ref 11.5–15.5)
WBC: 9 10*3/uL (ref 4.0–10.5)

## 2014-08-21 LAB — PREPARE RBC (CROSSMATCH)

## 2014-08-21 LAB — PROTIME-INR
INR: 1.07 (ref 0.00–1.49)
PROTHROMBIN TIME: 14 s (ref 11.6–15.2)

## 2014-08-21 LAB — APTT: APTT: 27 s (ref 24–37)

## 2014-08-21 MED ORDER — HYDROCODONE-ACETAMINOPHEN 5-325 MG PO TABS
1.0000 | ORAL_TABLET | Freq: Four times a day (QID) | ORAL | Status: DC | PRN
  Administered 2014-08-22: 1 via ORAL
  Filled 2014-08-21: qty 1

## 2014-08-21 MED ORDER — PANTOPRAZOLE SODIUM 40 MG IV SOLR
40.0000 mg | INTRAVENOUS | Status: DC
  Administered 2014-08-21 – 2014-08-22 (×2): 40 mg via INTRAVENOUS
  Filled 2014-08-21 (×2): qty 40

## 2014-08-21 MED ORDER — ONDANSETRON HCL 4 MG PO TABS: 4.0000 mg | ORAL_TABLET | Freq: Four times a day (QID) | ORAL | Status: DC | PRN

## 2014-08-21 MED ORDER — SODIUM CHLORIDE 0.9 % IV SOLN
INTRAVENOUS | Status: DC
  Administered 2014-08-21 – 2014-08-23 (×5): via INTRAVENOUS

## 2014-08-21 MED ORDER — SODIUM CHLORIDE 0.9 % IV SOLN: Freq: Once | INTRAVENOUS | Status: DC

## 2014-08-21 MED ORDER — MIRTAZAPINE 15 MG PO TABS
7.5000 mg | ORAL_TABLET | Freq: Every day | ORAL | Status: DC
  Administered 2014-08-22 – 2014-08-24 (×3): 7.5 mg via ORAL
  Filled 2014-08-21 (×6): qty 0.5

## 2014-08-21 MED ORDER — ENSURE COMPLETE PO LIQD
237.0000 mL | Freq: Two times a day (BID) | ORAL | Status: DC
  Administered 2014-08-23 – 2014-08-25 (×5): 237 mL via ORAL
  Filled 2014-08-21: qty 237

## 2014-08-21 MED ORDER — ONDANSETRON HCL 4 MG/2ML IJ SOLN: 4.0000 mg | Freq: Four times a day (QID) | INTRAMUSCULAR | Status: DC | PRN

## 2014-08-21 MED ORDER — SODIUM CHLORIDE 0.9 % IJ SOLN
3.0000 mL | Freq: Two times a day (BID) | INTRAMUSCULAR | Status: DC
  Administered 2014-08-22 – 2014-08-25 (×6): 3 mL via INTRAVENOUS

## 2014-08-21 MED ORDER — SODIUM CHLORIDE 0.9 % IV SOLN
INTRAVENOUS | Status: DC
  Administered 2014-08-21: 17:00:00 via INTRAVENOUS

## 2014-08-21 NOTE — ED Notes (Signed)
Had large amount of bright red bleed today per Nurse from [facility].

## 2014-08-21 NOTE — ED Notes (Signed)
MD at bedside. 

## 2014-08-21 NOTE — ED Provider Notes (Signed)
CSN: [id number]     Arrival date & time 08/21/14  1623 History  This chart was scribed for Leota Jacobsen, MD by Delphia Grates, ED Scribe. This patient was seen in room APA03/APA03 and the patient's care was started at 4:46 PM.   Chief Complaint  Patient presents with  . Rectal Bleeding    Level 5 Caveat: Mental Status Change   The history is provided by medical records, a relative and the patient. No language interpreter was used.     HPI Comments: Alicia Guerra is a 80 y.o. female, with history of HTN, arthritis, anemia, and recent blood transfusion (08/05/14), who presents to the Emergency Department complaining of rectal bleeding that began PTA. Patient was recently discharged from the hospital after reduction of a left femur fracture (08/07/2014). Today, a Nurse from [facility] reports there was a large amount of bright red blood in her stool. Patient has hemoglobin level of 7.7. Patient reports pain in her left leg, but denies black/bloody stools, nausea, vomiting, or abdominal pain.      Past Medical History  Diagnosis Date  . Hypertension   . Arthritis   . Anemia   . History of blood transfusion     "today is the first time" (08/05/2014)   Past Surgical History  Procedure Laterality Date  . Cataract extraction Left   . Tonsillectomy    . Appendectomy    . Cholecystectomy    . Cystectomy      "had cyst taken off lower back"  . Orif femur fracture Left 08/07/2014    Procedure: OPEN REDUCTION INTERNAL FIXATION (ORIF)  LEFT FEMUR FRACTURE;  Surgeon: [name], MD;  Location: [place];  Service: Orthopedics;  Laterality: Left;   History reviewed. No pertinent family history. History  Substance Use Topics  . Smoking status: Former Research scientist (life sciences)  . Smokeless tobacco: Never Used  . Alcohol Use: No   OB History    Gravida Para Term Preterm AB TAB SAB Ectopic Multiple Living            0     Review of Systems  Unable to perform ROS: Mental status change       Allergies  Review of patient's allergies indicates no known allergies.  Home Medications   Prior to Admission medications   Medication Sig Start Date End Date Taking? Authorizing Provider  aspirin EC 325 MG EC tablet Take 1 tablet (325 mg total) by mouth daily with breakfast. 08/15/14   Hosie Poisson, MD  feeding supplement, ENSURE COMPLETE, (ENSURE COMPLETE) LIQD Take 237 mLs by mouth 2 (two) times daily between meals. 08/15/14   Hosie Poisson, MD  ferrous sulfate 325 (65 FE) MG tablet Take 1 tablet (325 mg total) by mouth 3 (three) times daily with meals. 08/15/14   Hosie Poisson, MD  HYDROcodone-acetaminophen (NORCO/VICODIN) 5-325 MG per tablet Take 1-2 tablets by mouth every 6 (six) hours as needed for moderate pain. 08/15/14   Hosie Poisson, MD  mirtazapine (REMERON) 7.5 MG tablet Take 1 tablet (7.5 mg total) by mouth at bedtime. 08/15/14   Hosie Poisson, MD   Triage Vitals: BP 155/60 mmHg  Pulse 105  Temp(Src) 97.7 F (36.5 C) (Oral)  Resp 13  Ht 5' (1.524 m)  Wt 103 lb (46.72 kg)  BMI 20.12 kg/m2  SpO2 94%  Physical Exam  Constitutional: She is oriented to person, place, and time. She appears cachectic.  Non-toxic appearance. No distress.  HENT:  Head: Normocephalic and atraumatic.  Eyes: EOM and lids are normal. Pupils are equal, round, and reactive to light.  Conjunctiva are pale.  Neck: Normal range of motion. Neck supple. No tracheal deviation present. No thyroid mass present.  Cardiovascular: Normal rate, regular rhythm and normal heart sounds.  Exam reveals no gallop.   No murmur heard. Pulmonary/Chest: Effort normal and breath sounds normal. No stridor. No respiratory distress. She has no decreased breath sounds. She has no wheezes. She has no rhonchi. She has no rales.  Abdominal: Soft. Normal appearance and bowel sounds are normal. She exhibits no distension. There is no tenderness. There is no rebound and no CVA tenderness.  Genitourinary:  Melanotic stools.   Musculoskeletal: Normal range of motion. She exhibits no edema or tenderness.  Neurological: She is alert and oriented to person, place, and time. She has normal strength. No cranial nerve deficit or sensory deficit. GCS eye subscore is 4. GCS verbal subscore is 5. GCS motor subscore is 6.  Skin: Skin is warm and dry. No abrasion and no rash noted. There is pallor.  Psychiatric: Her behavior is normal. Her affect is blunt. Her speech is slurred.  Nursing note and vitals reviewed.   ED Course  Procedures (including critical care time)  DIAGNOSTIC STUDIES: Oxygen Saturation is 94% on room air, adequate by my interpretation.    COORDINATION OF CARE: At [id number] Discussed treatment plan with patient and neice. They agree.   Labs Review Labs Reviewed  CBC WITH DIFFERENTIAL - Abnormal; Notable for the following:    RBC 2.90 (*)    Hemoglobin 7.0 (*)    HCT 23.2 (*)    MCH 24.1 (*)    RDW 26.2 (*)    Neutrophils Relative % 85 (*)    Lymphocytes Relative 7 (*)    Lymphs Abs 0.6 (*)    All other components within normal limits  COMPREHENSIVE METABOLIC PANEL - Abnormal; Notable for the following:    Glucose, Bld 123 (*)    Creatinine, Ser 1.78 (*)    Total Protein 5.6 (*)    Albumin 2.7 (*)    AST 47 (*)    ALT 54 (*)    Alkaline Phosphatase 144 (*)    Total Bilirubin 0.2 (*)    GFR calc non Af Amer 24 (*)    GFR calc Af Amer 28 (*)    All other components within normal limits  PROTIME-INR  APTT  TYPE AND SCREEN  PREPARE RBC (CROSSMATCH)    Imaging Review No results found.   EKG Interpretation None      MDM   Final diagnoses:  None    Blood transfusion with prbcs ordered, will admit to medicine   Leota Jacobsen, MD 08/21/14 1806

## 2014-08-21 NOTE — H&P (Signed)
[facility] History and Physical  Alicia Guerra [id number] DOB: [birth year] DOA: 08/21/2014  Referring physician: ER PCP: [name], MD   Chief Complaint: Melena  HPI: Alicia Guerra is a 80 y.o. female  This is an 80 year old lady who presents to the emergency room today with history of rectal bleeding. This started today. She was recently hospitalized in the beginning of December for a left femur fracture when she was also found to have a microcytic anemia with a hemoglobin around 5. She received blood transfusion and the plan was for outpatient investigation of her microcytic anemia by gastroenterology. She underwent surgery for the right femur fracture and was discharged to the [facility] for rehabilitation. She now presents with significantly reduced hemoglobin. She denies any nausea, vomiting, hematemesis or abdominal pain. She does take aspirin daily. There is no fever. There is no lightheadedness or dizziness.   Review of Systems:  Apart from symptoms above, all other systems are negative.  Past Medical History  Diagnosis Date  . Hypertension   . Arthritis   . Anemia   . History of blood transfusion     "today is the first time" (08/05/2014)   Past Surgical History  Procedure Laterality Date  . Cataract extraction Left   . Tonsillectomy    . Appendectomy    . Cholecystectomy    . Cystectomy      "had cyst taken off lower back"  . Orif femur fracture Left 08/07/2014    Procedure: OPEN REDUCTION INTERNAL FIXATION (ORIF)  LEFT FEMUR FRACTURE;  Surgeon: [name], MD;  Location: [place];  Service: Orthopedics;  Laterality: Left;   Social History:  reports that she has quit smoking. She has never used smokeless tobacco. She reports that she does not drink alcohol or use illicit drugs.  No Known Allergies  Family history: There is no family history of gastrointestinal disease.  Prior to Admission medications   Medication Sig Start Date End  Date Taking? Authorizing Provider  aspirin EC 325 MG EC tablet Take 1 tablet (325 mg total) by mouth daily with breakfast. 08/15/14  Yes Hosie Poisson, MD  feeding supplement, ENSURE COMPLETE, (ENSURE COMPLETE) LIQD Take 237 mLs by mouth 2 (two) times daily between meals. 08/15/14  Yes Hosie Poisson, MD  ferrous sulfate 325 (65 FE) MG tablet Take 1 tablet (325 mg total) by mouth 3 (three) times daily with meals. 08/15/14  Yes Hosie Poisson, MD  HYDROcodone-acetaminophen (NORCO/VICODIN) 5-325 MG per tablet Take 1-2 tablets by mouth every 6 (six) hours as needed for moderate pain. 08/15/14  Yes Hosie Poisson, MD  mirtazapine (REMERON) 7.5 MG tablet Take 1 tablet (7.5 mg total) by mouth at bedtime. 08/15/14  Yes Hosie Poisson, MD   Physical Exam: Filed Vitals:   08/21/14 1730 08/21/14 1800 08/21/14 1815 08/21/14 1830  BP: 153/67 143/57  144/60  Pulse: 103   101  Temp:      TempSrc:      Resp: 24 20  18   Height:      Weight:      SpO2: 100%  98% 99%    Wt Readings from Last 3 Encounters:  08/21/14 46.72 kg (103 lb)  08/15/14 46.8 kg (103 lb 2.8 oz)    General:  Appears calm and comfortable. She looks pale. She is hemodynamically stable. Eyes: PERRL, normal lids, irises & conjunctiva ENT: grossly normal hearing, lips & tongue Neck: no LAD, masses or thyromegaly Cardiovascular: RRR, no m/r/g. No LE edema. Telemetry:  SR, no arrhythmias  Respiratory: CTA bilaterally, no w/r/r. Normal respiratory effort. Abdomen: soft, ntnd Skin: no rash or induration seen on limited exam Musculoskeletal: Left leg is in a brace postoperatively. Psychiatric: grossly normal mood and affect, speech fluent and appropriate Neurologic: grossly non-focal.          Labs on Admission:  Basic Metabolic Panel:  Recent Labs Lab 08/15/14 0505 08/21/14 1705  NA 141 144  K 5.0 5.0  CL 110 110  CO2 15* 21  GLUCOSE 90 123*  BUN 58* 21  CREATININE 2.53* 1.78*  CALCIUM 8.5 8.7   Liver Function Tests:  Recent  Labs Lab 08/21/14 1705  AST 47*  ALT 54*  ALKPHOS 144*  BILITOT 0.2*  PROT 5.6*  ALBUMIN 2.7*   No results for input(s): LIPASE, AMYLASE in the last 168 hours. No results for input(s): AMMONIA in the last 168 hours. CBC:  Recent Labs Lab 08/21/14 1705  WBC 9.0  NEUTROABS 7.7  HGB 7.0*  HCT 23.2*  MCV 80.0  PLT PLATELET CLUMPS NOTED ON SMEAR, COUNT APPEARS ADEQUATE   Cardiac Enzymes: No results for input(s): CKTOTAL, CKMB, CKMBINDEX, TROPONINI in the last 168 hours.  BNP (last 3 results) No results for input(s): PROBNP in the last 8760 hours. CBG: No results for input(s): GLUCAP in the last 168 hours.  Radiological Exams on Admission: No results found.    Assessment/Plan   1. Acute blood loss anemia secondary to GI bleed. Rectal bleeding implies she has a lower GI bleed source. She is hemodynamically stable but her hemoglobin is decreased. 2 units of blood transfusion have been ordered. Aspirin will be held. Gastroenterology will be consulted. Follow hemoglobin closely. Intravenous Protonix in case this is a upper GI bleed from a gastric ulcer. 2. Status post recent left femur fracture and surgery for this. We will request orthopedics for any further recommendations.  Further recommendations will depend on patient's hospital progress.   Code Status: Full code.   DVT Prophylaxis: SCDs.  Family Communication: I discussed the plan with the patient at the bedside.   Disposition Plan: Back to the skilled nursing facility when medically stable.   Time spent: 60 minutes.  Doree Albee [facility] Pager [phone].

## 2014-08-22 ENCOUNTER — Encounter (HOSPITAL_COMMUNITY)
Admission: EM | Disposition: A | Discharge status: Skilled Nursing Facility | Payer: Self-pay | Source: Home / Self Care | Attending: Family Medicine

## 2014-08-22 ENCOUNTER — Encounter (HOSPITAL_COMMUNITY): Payer: Self-pay | Admitting: *Deleted

## 2014-08-22 DIAGNOSIS — K222 Esophageal obstruction: Secondary | ICD-10-CM

## 2014-08-22 DIAGNOSIS — D509 Iron deficiency anemia, unspecified: Secondary | ICD-10-CM

## 2014-08-22 DIAGNOSIS — K921 Melena: Principal | ICD-10-CM

## 2014-08-22 DIAGNOSIS — I10 Essential (primary) hypertension: Secondary | ICD-10-CM

## 2014-08-22 HISTORY — PX: ESOPHAGOGASTRODUODENOSCOPY: SHX5428

## 2014-08-22 LAB — CBC
HCT: 37.1 % (ref 36.0–46.0)
HEMATOCRIT: 27.4 % — AB (ref 36.0–46.0)
Hemoglobin: 8.8 g/dL — ABNORMAL LOW (ref 12.0–15.0)
Hemoglobin: 12 g/dL (ref 12.0–15.0)
MCH: 26.2 pg (ref 26.0–34.0)
MCH: 26.9 pg (ref 26.0–34.0)
MCHC: 32.1 g/dL (ref 30.0–36.0)
MCHC: 32.3 g/dL (ref 30.0–36.0)
MCV: 81.5 fL (ref 78.0–100.0)
MCV: 83.2 fL (ref 78.0–100.0)
Platelets: 317 10*3/uL (ref 150–400)
Platelets: 306 10*3/uL (ref 150–400)
RBC: 3.36 MIL/uL — ABNORMAL LOW (ref 3.87–5.11)
RBC: 4.46 MIL/uL (ref 3.87–5.11)
RDW: 21.9 % — AB (ref 11.5–15.5)
RDW: 19.8 % — AB (ref 11.5–15.5)
WBC: 8.2 10*3/uL (ref 4.0–10.5)
WBC: 10.5 10*3/uL (ref 4.0–10.5)

## 2014-08-22 LAB — COMPREHENSIVE METABOLIC PANEL
ALBUMIN: 2.8 g/dL — AB (ref 3.5–5.2)
ALT: 45 U/L — AB (ref 0–35)
ANION GAP: 14 (ref 5–15)
AST: 37 U/L (ref 0–37)
Alkaline Phosphatase: 153 U/L — ABNORMAL HIGH (ref 39–117)
BILIRUBIN TOTAL: 0.9 mg/dL (ref 0.3–1.2)
BUN: 17 mg/dL (ref 6–23)
CHLORIDE: 110 meq/L (ref 96–112)
CO2: 19 mEq/L (ref 19–32)
Calcium: 8.5 mg/dL (ref 8.4–10.5)
Creatinine, Ser: 1.57 mg/dL — ABNORMAL HIGH (ref 0.50–1.10)
GFR calc Af Amer: 33 mL/min — ABNORMAL LOW (ref >90)
GFR calc non Af Amer: 28 mL/min — ABNORMAL LOW (ref >90)
GLUCOSE: 92 mg/dL (ref 70–99)
POTASSIUM: 4.3 meq/L (ref 3.7–5.3)
Sodium: 143 mEq/L (ref 137–147)
Total Protein: 5.8 g/dL — ABNORMAL LOW (ref 6.0–8.3)

## 2014-08-22 LAB — PREPARE RBC (CROSSMATCH)

## 2014-08-22 LAB — MRSA PCR SCREENING: MRSA by PCR: NEGATIVE

## 2014-08-22 LAB — PROTIME-INR
INR: 1.01 (ref 0.00–1.49)
Prothrombin Time: 13.4 seconds (ref 11.6–15.2)

## 2014-08-22 SURGERY — EGD (ESOPHAGOGASTRODUODENOSCOPY)
Anesthesia: Moderate Sedation

## 2014-08-22 MED ORDER — PANTOPRAZOLE SODIUM 40 MG IV SOLR
40.0000 mg | Freq: Once | INTRAVENOUS | Status: AC
  Administered 2014-08-22: 40 mg via INTRAVENOUS
  Filled 2014-08-22: qty 40

## 2014-08-22 MED ORDER — PANTOPRAZOLE SODIUM 40 MG IV SOLR: 40.0000 mg | Freq: Two times a day (BID) | INTRAVENOUS | Status: DC

## 2014-08-22 MED ORDER — SODIUM CHLORIDE 0.9 % IV SOLN
8.0000 mg/h | INTRAVENOUS | Status: DC
  Administered 2014-08-22: 8 mg/h via INTRAVENOUS
  Filled 2014-08-22 (×8): qty 80

## 2014-08-22 MED ORDER — LIDOCAINE VISCOUS 2 % MT SOLN
OROMUCOSAL | Status: AC
  Filled 2014-08-22: qty 15

## 2014-08-22 MED ORDER — SODIUM CHLORIDE 0.9 % IV SOLN: Freq: Once | INTRAVENOUS | Status: DC

## 2014-08-22 MED ORDER — MIDAZOLAM HCL 5 MG/5ML IJ SOLN
INTRAMUSCULAR | Status: DC | PRN
  Administered 2014-08-22 (×2): 1 mg via INTRAVENOUS

## 2014-08-22 MED ORDER — HYDRALAZINE HCL 20 MG/ML IJ SOLN
10.0000 mg | Freq: Once | INTRAMUSCULAR | Status: AC
  Administered 2014-08-22: 10 mg via INTRAVENOUS
  Filled 2014-08-22: qty 1

## 2014-08-22 MED ORDER — PANTOPRAZOLE SODIUM 40 MG PO TBEC
40.0000 mg | DELAYED_RELEASE_TABLET | Freq: Every day | ORAL | Status: DC
  Administered 2014-08-23 – 2014-08-25 (×3): 40 mg via ORAL
  Filled 2014-08-22 (×3): qty 1

## 2014-08-22 MED ORDER — MEPERIDINE HCL 100 MG/ML IJ SOLN
INTRAMUSCULAR | Status: DC | PRN
  Administered 2014-08-22: 25 mg via INTRAVENOUS

## 2014-08-22 MED ORDER — HYDRALAZINE HCL 20 MG/ML IJ SOLN
10.0000 mg | Freq: Four times a day (QID) | INTRAMUSCULAR | Status: DC | PRN
  Administered 2014-08-22 (×2): 10 mg via INTRAVENOUS
  Filled 2014-08-22 (×2): qty 1

## 2014-08-22 MED ORDER — SIMETHICONE 40 MG/0.6ML PO SUSP
ORAL | Status: DC | PRN
  Administered 2014-08-22: 16:00:00

## 2014-08-22 MED ORDER — MIDAZOLAM HCL 5 MG/5ML IJ SOLN
INTRAMUSCULAR | Status: AC
  Filled 2014-08-22: qty 10

## 2014-08-22 MED ORDER — LIDOCAINE VISCOUS 2 % MT SOLN
OROMUCOSAL | Status: DC | PRN
  Administered 2014-08-22: 3 mL via OROMUCOSAL

## 2014-08-22 MED ORDER — HYDRALAZINE HCL 20 MG/ML IJ SOLN
10.0000 mg | INTRAMUSCULAR | Status: AC
  Administered 2014-08-22: 10 mg via INTRAVENOUS
  Filled 2014-08-22: qty 1

## 2014-08-22 MED ORDER — MEPERIDINE HCL 100 MG/ML IJ SOLN
INTRAMUSCULAR | Status: AC
  Filled 2014-08-22: qty 2

## 2014-08-22 MED ORDER — HYDRALAZINE HCL 20 MG/ML IJ SOLN
20.0000 mg | Freq: Four times a day (QID) | INTRAMUSCULAR | Status: DC | PRN
  Administered 2014-08-23 – 2014-08-24 (×4): 20 mg via INTRAVENOUS
  Filled 2014-08-22 (×4): qty 1

## 2014-08-22 NOTE — Progress Notes (Signed)
Pt's SBP consistently in 160's to 180's; pt is asymptomatic; MD notified. Orders received. Will continue to monitor

## 2014-08-22 NOTE — Progress Notes (Addendum)
NUTRITION FOLLOW UP  DOCUMENTATION CODES Per approved criteria  -Severe malnutrition in the context of chronic illness   INTERVENTION: -When diet is advanced, resume Ensure Complete po BID, each supplement provides 350 kcal and 13 grams of protein -RD to follow for nutrition care plan  NUTRITION DIAGNOSIS: Increased nutrient needs related to malnutrition, repletion as evidenced by estimated nutrition needs, ongoing  Goal: Pt to meet >/= 90% of their estimated nutrition needs, unmet  Monitor:  Diet advancement, percent meals and  supplement intake, weight changes, labs, I/O's  ASSESSMENT: Hx: 80 year old Female presented to [facility] to ED after sustaining a fall on 12/1 when she slid down the steps and hit her left knee. Extraocular left knee in the ED showed comminuted fracture of the distal femoral diaphysis with angulation and displacement at the fracture site. Patient transferred to [name] for orthopedic evaluation. Patient was also found to have a hemoglobin of 5.1 with guaiac-positive stools. CXR concerning for new RLL PNA.  Patient s/p procedure 12/3: OPEN REDUCTION INTERNAL FIXATION (ORIF) LEFT FEMUR FRACTURE  Speech Path BSS  on 12/2.  ST rec's Dys 1, thin liquid diet.  Pt admitted from [facility] with rectal bleed. Blood loss anemia and HTN. Prior to admission diet was Pureed/No Added Salt. She's currently NPO. Will continue to follow and add oral supplement once diet is advanced.   Height: Ht Readings from Last 1 Encounters:  08/22/14 5' (1.524 m)    Weight: Wt Readings from Last 1 Encounters:  08/22/14 100 lb 12 oz (45.7 kg)  12/15- 98# (PNC)  BMI:  Body mass index is 19.68 kg/(m^2). normal range  Estimated Nutritional Needs: Kcal: 1400-1600 Protein: 60-70 gm Fluid: >/= 1.5 L  Skin: Intact  Diet Order: NPO   Intake/Output Summary (Last 24 hours) at 08/22/14 1437 Last data filed at 08/22/14 1300  Gross per 24 hour  Intake 2376.34 ml  Output    600 ml  Net  1776.34 ml    Labs:   Recent Labs Lab 08/21/14 1705 08/22/14 0914  NA 144 143  K 5.0 4.3  CL 110 110  CO2 21 19  BUN 21 17  CREATININE 1.78* 1.57*  CALCIUM 8.7 8.5  GLUCOSE 123* 92    Scheduled Meds: . sodium chloride   Intravenous Once  . sodium chloride   Intravenous Once  . feeding supplement (ENSURE COMPLETE)  237 mL Oral BID BM  . mirtazapine  7.5 mg Oral QHS  . [START ON 08/25/2014] pantoprazole (PROTONIX) IV  40 mg Intravenous Q12H  . sodium chloride  3 mL Intravenous Q12H    Continuous Infusions: . sodium chloride 75 mL/hr at 08/22/14 1300  . pantoprozole (PROTONIX) infusion 8 mg/hr (08/22/14 1300)    Past Medical History  Diagnosis Date  . Hypertension   . Arthritis   . Anemia   . History of blood transfusion     "today is the first time" (08/05/2014)    Past Surgical History  Procedure Laterality Date  . Cataract extraction Left   . Tonsillectomy    . Appendectomy    . Cholecystectomy    . Cystectomy      "had cyst taken off lower back"  . Orif femur fracture Left 08/07/2014    Procedure: OPEN REDUCTION INTERNAL FIXATION (ORIF)  LEFT FEMUR FRACTURE;  Surgeon: [name], MD;  Location: [place];  Service: Orthopedics;  Laterality: Left;    Colman Cater MS,RD,CSG,LDN Office: [phone] Pager: [phone]

## 2014-08-22 NOTE — Clinical Social Work Psychosocial (Signed)
Clinical Social Work Department BRIEF PSYCHOSOCIAL ASSESSMENT 08/22/2014  Patient:  Alicia Guerra, Alicia Guerra     Account Number:  [id number]     Admit date:  08/21/2014  Clinical Social Worker:  Alicia Guerra  Date/Time:  08/22/2014 11:29 AM  Referred by:  CSW  Date Referred:  08/22/2014 Referred for  SNF Placement   Other Referral:   Interview type:  Patient Other interview type:   Niece, Alicia Guerra    PSYCHOSOCIAL DATA Living Status:  FACILITY Admitted from facility:  [facility] Level of care:  [place] Primary support name:  Alicia Guerra Primary support relationship to patient:  FAMILY Degree of support available:   Alicia Guerra is very supportive    CURRENT CONCERNS Current Concerns  Post-Acute Placement   Other Concerns:    SOCIAL WORK ASSESSMENT / PLAN CSW met with patient. Patient was completely oriented to herself. She was aware that it was Friday and December, however she could not recall the year, nor the president. She was aware that she was in a hospital in [place], however unaware of the hospital name. She indicated that she lived on [address] prior to her placement in to [facility].  Patient stated that her niece provides assistance to her when she is at home. Patient stated that her niece visits her since she has been in [facility].  Patient indicated that she has been at [facility] for 2-3 days.  She stated that she broke her leg when she slipped on leaves on the steps. She stated that prior to this injury she walked independently. Patient stated that she lived alone and completed all of her ADL's independently prior to her fall.  Patient's niece, Alicia Guerra, confirmed patient's statements. She stated that she assist patient when needed but patient requires minimal assistance (prior to fall) and is very independent.  Alicia Guerra indicated that she visits or calls patient daily.  Mrs. Alicia Guerra indicated that she had cleaned patient's room out at  [facility] as staff at [facility] indicated that they could not hold patient's bed.  She indicated that she had been speaking with Alicia Guerra and [place] facilities regarding placement for patient. She stated that she and patient did not want patient to go to [facility] nor [facility].  CSW advised that she could send clinicals to both facilities.  Mrs. Alicia Guerra and patient were agreeable to CSW sending clinicals to both facilities.  CSW spoke with Alicia Guerra at [facility]. She indicated that patient arrived at the facility on 08/15/14. She stated that patient's niece was supportive. She indicated that patient was nonambulatory due to the leg injury. Alicia Guerra stated that patient was a two person transfer to wheelchair and were dependent with her lower body ADL's. She stated that patient could come back if there was a bed available when she was discharged.   Assessment/plan status:  Information/Referral to [employer] Other assessment/ plan:   Information/referral to community resources:    PATIENT'S/FAMILY'S RESPONSE TO PLAN OF CARE: Patient and niece are interested in SNF placement at [facility] [facility] and [facility].    Alicia Guerra, Alicia Guerra

## 2014-08-22 NOTE — Progress Notes (Signed)
Patient is a new admit to unit. Patient alert and oriented x 4. Patient given first unit of blood and when patient was repoisition in bed, noted chuck and pad was saturated with extra large amount of dark blood with multiple large blood clots. Dr. Everette Rank and Dr. Darrick Meigs was notified. Dr. Darrick Meigs ordered stat CBC, send to stepdown unit and start second unit of blood. Next of kin [name]  Notified. Patient transferred to stepdown unit.

## 2014-08-22 NOTE — Evaluation (Signed)
Physical Therapy Evaluation Patient Details Name: Alicia Guerra MRN: [id number] DOB: 07/26/1924 Today's Date: 08/22/2014   History of Present Illness  Pt is an 80 year old female admitted for c/o a GI bleed.  She has been at the [facility] following an OTIF of the left femur done on 08-07-14 by Dr.Murphy.  She has baseline cognitive deficit but has been able to follow directions.  She is to remain in a left knee immobilizer and is NWB left.  Clinical Impression   Pt was seen for evaluation.  She is found in bed with family at bedside.  Pt is awaiting having an EGD.  Her knee immobilizer has been removed by nursing as it was soiled by stool.  They are in the process of ordering a new immobilizer.  Pt is alert but not appropriate in her conversation.  She was very cooperative and able to follow directions.  We initiated therapeutic exercise to the RLE and included left hip abduction/adduction and ankle pumps AA.  She tolerated this well but is found to have significant weakness in all muscle groups.  We did not mobilize out of bed since she will be going for EGD and has no immobilizer at this time.  Per chart report, it is taking 2 people to transfer bed to chair.  Pt will definitely need SNF at d/c.    Follow Up Recommendations SNF    Equipment Recommendations  Rolling walker with 5" wheels;3in1 (PT)    Recommendations for Other Services   OT    Precautions / Restrictions Precautions Precautions: Fall Required Braces or Orthoses: Knee Immobilizer - Right Knee Immobilizer - Left: On at all times Restrictions Weight Bearing Restrictions: Yes LLE Weight Bearing: Non weight bearing      Mobility  Bed Mobility Overal bed mobility:  (not tested...awaiting  EGD)             General bed mobility comments: pt's immobilizer is not usable as it has been soiled by feces...pt reportedly needs assist of 2 to transfer bed to chair  Transfers                    Ambulation/Gait                 Stairs            Wheelchair Mobility    Modified Rankin (Stroke Patients Only)       Balance Overall balance assessment:  (not tested)                                           Pertinent Vitals/Pain Pain Assessment: No/denies pain (at rest)    Home Living Family/patient expects to be discharged to:: Skilled nursing facility                      Prior Function Level of Independence: Independent with assistive device(s)               Hand Dominance   Dominant Hand: Right    Extremity/Trunk Assessment               Lower Extremity Assessment: Generalized weakness   LLE Deficits / Details: s/p ORIF left femur; KI on at all times; NWB  Cervical / Trunk Assessment: Kyphotic  Communication   Communication: No difficulties  Cognition Arousal/Alertness: Awake/alert Behavior During Therapy:  WFL for tasks assessed/performed Overall Cognitive Status: History of cognitive impairments - at baseline                      General Comments      Exercises General Exercises - Lower Extremity Ankle Circles/Pumps: AROM;Both;10 reps;Supine Short Arc Quad: AAROM;Right;10 reps;Supine Heel Slides: AAROM;Right;10 reps;Supine Hip ABduction/ADduction: AAROM;Left;Both;10 reps;Supine      Assessment/Plan    PT Assessment Patient needs continued PT services  PT Diagnosis Difficulty walking;Generalized weakness   PT Problem List Decreased strength;Decreased activity tolerance;Decreased mobility;Decreased cognition;Decreased knowledge of use of DME  PT Treatment Interventions Functional mobility training;Therapeutic exercise   PT Goals (Current goals can be found in the Care Plan section) Acute Rehab PT Goals Patient Stated Goal: none stated PT Goal Formulation: With patient/family Time For Goal Achievement: 09/05/14 Potential to Achieve Goals: Fair    Frequency Min 3X/week   Barriers to discharge   none     Co-evaluation               End of Session   Activity Tolerance: Patient tolerated treatment well Patient left: in bed;with call bell/phone within reach;with bed alarm set Nurse Communication: Mobility status         Time: 1317-1350 PT Time Calculation (min) (ACUTE ONLY): 33 min   Charges:   PT Evaluation $Initial PT Evaluation Tier I: 1 Procedure PT Treatments $Therapeutic Exercise: 8-22 mins   PT G Codes:          Sable Feil 08/22/2014, 1:58 PM

## 2014-08-22 NOTE — Plan of Care (Signed)
Problem: Phase I Progression Outcomes Goal: Pain controlled with appropriate interventions Outcome: Progressing Managing pain with PO medications    Goal: Initial discharge plan identified Outcome: Progressing Pt to go to SNF once able to be discharged    Goal: Voiding-avoid urinary catheter unless indicated Outcome: Not Applicable Date Met:  54/88/30 Foley placed by urology and MD orders for foley to continue Goal: Hemodynamically stable Outcome: Not Progressing Pt continues to have SBP's ranging from 160's to 190s; HR in 110's; sinus tach

## 2014-08-22 NOTE — H&P (Signed)
  Primary Care Physician:  [name], MD Primary Gastroenterologist:  Dr. Oneida Alar  Pre-Procedure History & Physical: HPI:  Alicia Guerra is a 80 y.o. female here for  MELENA.  Past Medical History  Diagnosis Date  . Hypertension   . Arthritis   . Anemia   . History of blood transfusion     "today is the first time" (08/05/2014)   Past Surgical History  Procedure Laterality Date  . Cataract extraction Left   . Tonsillectomy    . Appendectomy    . Cholecystectomy    . Cystectomy      "had cyst taken off lower back"  . Orif femur fracture Left 08/07/2014    Procedure: OPEN REDUCTION INTERNAL FIXATION (ORIF)  LEFT FEMUR FRACTURE;  Surgeon: [name], MD;  Location: [place];  Service: Orthopedics;  Laterality: Left;    Prior to Admission medications   Medication Sig Start Date End Date Taking? Authorizing Provider  aspirin EC 325 MG EC tablet Take 1 tablet (325 mg total) by mouth daily with breakfast. 08/15/14  Yes Hosie Poisson, MD  feeding supplement, ENSURE COMPLETE, (ENSURE COMPLETE) LIQD Take 237 mLs by mouth 2 (two) times daily between meals. 08/15/14  Yes Hosie Poisson, MD  ferrous sulfate 325 (65 FE) MG tablet Take 1 tablet (325 mg total) by mouth 3 (three) times daily with meals. 08/15/14  Yes Hosie Poisson, MD  HYDROcodone-acetaminophen (NORCO/VICODIN) 5-325 MG per tablet Take 1-2 tablets by mouth every 6 (six) hours as needed for moderate pain. 08/15/14  Yes Hosie Poisson, MD  mirtazapine (REMERON) 7.5 MG tablet Take 1 tablet (7.5 mg total) by mouth at bedtime. 08/15/14  Yes Hosie Poisson, MD    Allergies as of 08/21/2014  . (No Known Allergies)    History reviewed. No pertinent family history.  History   Social History  . Marital Status: Widowed    Spouse Name: N/A    Number of Children: N/A  . Years of Education: N/A   Occupational History  . Not on file.   Social History Main Topics  . Smoking status: Former Smoker -- 0.25 packs/day for 16 years     Types: Cigarettes  . Smokeless tobacco: Never Used  . Alcohol Use: No  . Drug Use: No  . Sexual Activity: No   Other Topics Concern  . Not on file   Social History Narrative    Review of Systems: See HPI, otherwise negative ROS   Physical Exam: BP 188/97 mmHg  Pulse 114  Temp(Src) 97.6 F (36.4 C) (Oral)  Resp 22  Ht 5' (1.524 m)  Wt 100 lb 12 oz (45.7 kg)  BMI 19.68 kg/m2  SpO2 94% General:   Alert,  pleasant and cooperative in NAD Head:  Normocephalic and atraumatic. Neck:  Supple; Lungs:  Clear throughout to auscultation.    Heart:  Regular rate and rhythm. Abdomen:  Soft, nontender and nondistended. Normal bowel sounds, without guarding, and without rebound.   Neurologic:  Alert and  oriented x4;  grossly normal neurologically.  Impression/Plan:   MELENA  PLAN: 1. EGD TODAY

## 2014-08-22 NOTE — Op Note (Signed)
[facility] [address] [place], [address]   ENDOSCOPY PROCEDURE REPORT  PATIENT: Alicia Guerra, Alicia Guerra  MR#: [id number] BIRTHDATE: [birth year] , 89  yrs. old GENDER: female  ENDOSCOPIST: Barney Drain, MD REFERRED [id number] Luan Pulling, M.D.  PROCEDURE DATE: 09-10-2014 PROCEDURE:   EGD, diagnostic  INDICATIONS:NURISING REPORTS PT HAD BLACK STOOLS AND DROP IN Hb. D/C DEC 11 Hb 7.7, ADMISSION Hb 7.0 AND NOW Hb 12.0 ATFRE 2 upRBCs. PT HAS REMAINED HEMODYNAMICALLY STABLE.Marland Kitchen PT ON ASA W/O PPI AT REHAB. MEDICATIONS: Demerol 25 mg IV and Versed 2 mg IV TOPICAL ANESTHETIC:   Viscous Xylocaine ASA CLASS:  DESCRIPTION OF PROCEDURE:     Physical exam was performed.  Informed consent was obtained from the patient after explaining the benefits, risks, and alternatives to the procedure.  The patient was connected to the monitor and placed in the left lateral position.  Continuous oxygen was provided by nasal cannula and IV medicine administered through an indwelling cannula.  After administration of sedation, the patients esophagus was intubated and the [id number]  endoscope was advanced under direct visualization to the second portion of the duodenum.  The scope was removed slowly by carefully examining the color, texture, anatomy, and integrity of the mucosa on the way out.  The patient was recovered in endoscopy and discharged home in satisfactory condition.   ESOPHAGUS: A Schatzki ring was found at the gastroesophageal junction and was widely open.  STOMACH: A small hiatal hernia was noted.   The mucosa of the stomach appeared normal.   NO OLD BLOOD OR FRESH BLOOD IN STOMACH.   DUODENUM: The duodenal mucosa showed no abnormalities in the bulb and 2nd part of the duodenum. COMPLICATIONS: There were no immediate complications.  ENDOSCOPIC IMPRESSION: 1.   Schatzki ring at the gastroesophageal junction 2.   Small hiatal hernia 3.   NO SOURCE FOR A GI BLEED  IDENTIFIED  RECOMMENDATIONS: D/C IV PROTONIX. DAILY PPI FOR GI PROPHYLAXIS CONTINUE ASA HOLD IRON MONITOR FOR RECURRENT BLEEDING OR TRANSFUSION DEPENDENT ANEMIA.  REPEAT EXAM:    _______________________________ eSignedBarney Drain, MD 2014/09/10 4:40 PM     CPT CODES: ICD CODES:  The ICD and CPT codes recommended by this software are interpretations from the data that the clinical staff has captured with the software.  The verification of the translation of this report to the ICD and CPT codes and modifiers is the sole responsibility of the health care institution and practicing physician where this report was generated.  [employer] will not be held responsible for the validity of the ICD and CPT codes included on this report.  AMA assumes no liability for data contained or not contained herein. CPT is a Designer, television/film set of the [employer].

## 2014-08-22 NOTE — Progress Notes (Signed)
Pt given hydralazine 10 mg at noon for sbp >180; MD notified at 1250 that pressures were not much improved; no reply. MD paged again at 1345 and notified that SBP still in 170's to 180's, no new orders.

## 2014-08-22 NOTE — Progress Notes (Signed)
[facility] PROGRESS NOTE  Alicia Guerra [id number] DOB: [birth year] DOA: 08/21/2014 PCP: [name], MD  Assessment/Plan: GI bleed/Gastrointestinal hemorrhage with melena - GI on board and plans are for EGD  - Agree with IV protonix - monitor hgb levels  Active Problems:   Blood loss anemia - 2ary to GI loss - Will continue to monitor hgb - transfuse if hgb < 8.0 with active bleed or < 7.0 with no active bleeding  HTN - Not well controlled - Will place on hydralazine prn     Code Status: full Family Communication: No family at bedside Disposition Plan: Pending improvement in condition.   Consultants:  Gastroenterologist  Procedures:  None  Antibiotics:  None  HPI/Subjective: Pt has no new complaints. No acute issues overnight.  Objective: Filed Vitals:   08/22/14 1100  BP: 184/70  Pulse: 116  Temp:   Resp: 20    Intake/Output Summary (Last 24 hours) at 08/22/14 1145 Last data filed at 08/22/14 1000  Gross per 24 hour  Intake 2076.34 ml  Output    600 ml  Net 1476.34 ml   Filed Weights   08/21/14 1626 08/21/14 2013 08/22/14 0220  Weight: 46.72 kg (103 lb) 44.135 kg (97 lb 4.8 oz) 45.7 kg (100 lb 12 oz)    Exam:   General:  Pt in nad, alert and awake  Cardiovascular: rrr, no mrg  Respiratory: cta bl, no wheezes  Abdomen: soft, NT, ND  Musculoskeletal: no cyanosis or clubbing   Data Reviewed: Basic Metabolic Panel:  Recent Labs Lab 08/21/14 1705 08/22/14 0914  NA 144 143  K 5.0 4.3  CL 110 110  CO2 21 19  GLUCOSE 123* 92  BUN 21 17  CREATININE 1.78* 1.57*  CALCIUM 8.7 8.5   Liver Function Tests:  Recent Labs Lab 08/21/14 1705 08/22/14 0914  AST 47* 37  ALT 54* 45*  ALKPHOS 144* 153*  BILITOT 0.2* 0.9  PROT 5.6* 5.8*  ALBUMIN 2.7* 2.8*   No results for input(s): LIPASE, AMYLASE in the last 168 hours. No results for input(s): AMMONIA in the last 168 hours. CBC:  Recent Labs Lab 08/21/14 1705  08/22/14 0146 08/22/14 0914  WBC 9.0 8.2 10.5  NEUTROABS 7.7  --   --   HGB 7.0* 8.8* 12.0  HCT 23.2* 27.4* 37.1  MCV 80.0 81.5 83.2  PLT PLATELET CLUMPS NOTED ON SMEAR, COUNT APPEARS ADEQUATE 317 306   Cardiac Enzymes: No results for input(s): CKTOTAL, CKMB, CKMBINDEX, TROPONINI in the last 168 hours. BNP (last 3 results) No results for input(s): PROBNP in the last 8760 hours. CBG: No results for input(s): GLUCAP in the last 168 hours.  Recent Results (from the past 240 hour(s))  MRSA PCR Screening     Status: None   Collection Time: 08/22/14  2:18 AM  Result Value Ref Range Status   MRSA by PCR NEGATIVE NEGATIVE Final    Comment:        The GeneXpert MRSA Assay (FDA approved for NASAL specimens only), is one component of a comprehensive MRSA colonization surveillance program. It is not intended to diagnose MRSA infection nor to guide or monitor treatment for MRSA infections.      Studies: No results found.  Scheduled Meds: . sodium chloride   Intravenous Once  . sodium chloride   Intravenous Once  . feeding supplement (ENSURE COMPLETE)  237 mL Oral BID BM  . mirtazapine  7.5 mg Oral QHS  . [START ON 08/25/2014] pantoprazole (PROTONIX)  IV  40 mg Intravenous Q12H  . sodium chloride  3 mL Intravenous Q12H   Continuous Infusions: . sodium chloride 75 mL/hr at 08/22/14 1000  . pantoprozole (PROTONIX) infusion 8 mg/hr (08/22/14 1000)     Time spent: > 35 minutes    Velvet Bathe  [facility] Pager [phone] If 7PM-7AM, please contact night-coverage at [email or web address], password [facility] 08/22/2014, 11:45 AM  LOS: 1 day

## 2014-08-22 NOTE — Care Management Note (Addendum)
    Page 1 of 1   08/25/2014     11:31:03 AM CARE MANAGEMENT NOTE 08/25/2014  Patient:  Alicia Guerra, Alicia Guerra   Account Number:  [id number]  Date Initiated:  08/22/2014  Documentation initiated by:  Jolene Provost  Subjective/Objective Assessment:   Pt is from [facility] for rehab. Pt will return to SNF at discharge. CSW aware of discharge plan and will arrange for return to facility. No CM needs at this time.     Action/Plan:   Anticipated DC Date:  08/24/2014   Anticipated DC Plan:  SKILLED NURSING FACILITY         Choice offered to / List presented to:             Status of service:  Completed, signed off Medicare Important Message given?  YES (If response is "NO", the following Medicare IM given date fields will be blank) Date Medicare IM given:  08/22/2014 Medicare IM given by:  Jolene Provost Date Additional Medicare IM given:  08/25/2014 Additional Medicare IM given by:  Theophilus Kinds  Discharge Disposition:  [place]  Per UR Regulation:  Reviewed for med. necessity/level of care/duration of stay  If discussed at [place] of Stay Meetings, dates discussed:    Comments:  08/25/14 [place], RN BSN CM Pt to be discharged to [facility] today. CSW to arrange discharge to facility.  08/22/2014 [place], RN, MSN, [facility]

## 2014-08-22 NOTE — Consult Note (Signed)
Referring Provider: Doree Albee, MD Primary Care Physician:  [name], MD Primary Gastroenterologist:  Barney Drain, MD  Reason for Consultation:  Rectal bleeding  HPI: Alicia Guerra is a 80 y.o. female presented to ER with rectal bleeding of less than 24 hours. Reported to have brbpr. In the ER, noted to have melanotic stool.  Her Hgb was 7. INR 1.07. At 1:45 this morning patient was noted to have extra large amount of dark blood with multiple large blood clots in bed with repositioning. She was transferred to stepdown unit. Pulse has been in the 110s. Systolic pressures in the 150-200 range over night. Currently systolic pressure in the 0000000. She is receiving IV protonix daily (two doses since admission).  Hospitalized earlier this month with left femur fracture requiring surgery and noted to have microcytic anemia, hgb 5 with heme + stools, labs most c/w IDA. Received 2 units of blood at that time. Hospital course was complicated by acute on chronic kidney disease, SIRS with acute encephalopathy, PNA, UTI. At discharge her Hgb was 7.7.      Prior to Admission medications   Medication Sig Start Date End Date Taking? Authorizing Provider  aspirin EC 325 MG EC tablet Take 1 tablet (325 mg total) by mouth daily with breakfast. 08/15/14  Yes Hosie Poisson, MD  feeding supplement, ENSURE COMPLETE, (ENSURE COMPLETE) LIQD Take 237 mLs by mouth 2 (two) times daily between meals. 08/15/14  Yes Hosie Poisson, MD  ferrous sulfate 325 (65 FE) MG tablet Take 1 tablet (325 mg total) by mouth 3 (three) times daily with meals. 08/15/14  Yes Hosie Poisson, MD  HYDROcodone-acetaminophen (NORCO/VICODIN) 5-325 MG per tablet Take 1-2 tablets by mouth every 6 (six) hours as needed for moderate pain. 08/15/14  Yes Hosie Poisson, MD  mirtazapine (REMERON) 7.5 MG tablet Take 1 tablet (7.5 mg total) by mouth at bedtime. 08/15/14  Yes Hosie Poisson, MD    Current Facility-Administered Medications   Medication Dose Route Frequency Provider Last Rate Last Dose  . 0.9 %  sodium chloride infusion   Intravenous Once Leota Jacobsen, MD      . 0.9 %  sodium chloride infusion   Intravenous Continuous Doree Albee, MD 75 mL/hr at 08/22/14 0713    . feeding supplement (ENSURE COMPLETE) (ENSURE COMPLETE) liquid 237 mL  237 mL Oral BID BM Nimish C Gosrani, MD      . HYDROcodone-acetaminophen (NORCO/VICODIN) 5-325 MG per tablet 1-2 tablet  1-2 tablet Oral Q6H PRN Doree Albee, MD   1 tablet at 08/22/14 [phone]  . mirtazapine (REMERON) tablet 7.5 mg  7.5 mg Oral QHS Nimish C Gosrani, MD   7.5 mg at 08/22/14 0200  . ondansetron (ZOFRAN) tablet 4 mg  4 mg Oral Q6H PRN Nimish Luther Parody, MD       Or  . ondansetron (ZOFRAN) injection 4 mg  4 mg Intravenous Q6H PRN Nimish C Gosrani, MD      . pantoprazole (PROTONIX) injection 40 mg  40 mg Intravenous Q24H Nimish C Gosrani, MD   40 mg at 08/22/14 0201  . sodium chloride 0.9 % injection 3 mL  3 mL Intravenous Q12H Doree Albee, MD        Allergies as of 08/21/2014  . (No Known Allergies)    Past Medical History  Diagnosis Date  . Hypertension   . Arthritis   . Anemia   . History of blood transfusion     "today is the  first time" (08/05/2014)    Past Surgical History  Procedure Laterality Date  . Cataract extraction Left   . Tonsillectomy    . Appendectomy    . Cholecystectomy    . Cystectomy      "had cyst taken off lower back"  . Orif femur fracture Left 08/07/2014    Procedure: OPEN REDUCTION INTERNAL FIXATION (ORIF)  LEFT FEMUR FRACTURE;  Surgeon: [name], MD;  Location: [place];  Service: Orthopedics;  Laterality: Left;    History reviewed. No pertinent family history.  History   Social History  . Marital Status: Widowed    Spouse Name: N/A    Number of Children: N/A  . Years of Education: N/A   Occupational History  . Not on file.   Social History Main Topics  . Smoking status: Former Research scientist (life sciences)  . Smokeless  tobacco: Never Used  . Alcohol Use: No  . Drug Use: No  . Sexual Activity: No   Other Topics Concern  . Not on file   Social History Narrative     ROS: Patient denies. She is pleasantly confused, history unreliable.   General: Negative for anorexia, weight loss, fever, chills, fatigue, weakness. Eyes: Negative for vision changes.  ENT: Negative for hoarseness, difficulty swallowing , nasal congestion. CV: Negative for chest pain, angina, palpitations, dyspnea on exertion, peripheral edema.  Respiratory: Negative for dyspnea at rest, dyspnea on exertion, cough, sputum, wheezing.  GI: See history of present illness. GU:  Negative for dysuria, hematuria, urinary incontinence, urinary frequency, nocturnal urination.  MS: Negative for joint pain, low back pain.  Derm: Negative for rash or itching.  Neuro: Negative for weakness, abnormal sensation, seizure, frequent headaches, memory loss, confusion.  Psych: Negative for anxiety, depression, suicidal ideation, hallucinations.  Endo: Negative for unusual weight change.  Heme: Negative for bruising or bleeding. Allergy: Negative for rash or hives.       Physical Examination: Vital signs in last 24 hours: Temp:  [97.7 F (36.5 C)-98.7 F (37.1 C)] 97.9 F (36.6 C) (12/18 0715) Pulse Rate:  [98-123] 113 (12/18 0715) Resp:  [13-30] 19 (12/18 0715) BP: (128-200)/(51-93) 171/73 mmHg (12/18 0715) SpO2:  [94 %-100 %] 99 % (12/18 0715) Weight:  [97 lb 4.8 oz (44.135 kg)-103 lb (46.72 kg)] 100 lb 12 oz (45.7 kg) (12/18 0220) Last BM Date: 08/22/14  General: Well-nourished, well-developed in no acute distress.  Head: Normocephalic, atraumatic.   Eyes: Conjunctiva pink, no icterus. Mouth: Oropharyngeal mucosa moist and pink , no lesions erythema or exudate. Neck: Supple without thyromegaly, masses, or lymphadenopathy.  Lungs: Clear to auscultation bilaterally.  Heart: Regular rate and rhythm, no murmurs rubs or gallops.  Abdomen: Bowel  sounds are normal, nontender, nondistended, no hepatosplenomegaly or masses, no abdominal bruits or    hernia , no rebound or guarding.   Rectal: not performed Extremities: 1+ edema in lower extremity edema. No clubbing, deformity.  Neuro: Alert and oriented x 4 , grossly normal neurologically.  Skin: Warm and dry, no rash or jaundice.   Psych: Alert and cooperative, normal mood and affect.        Intake/Output from previous day: 12/17 0701 - 12/18 0700 In: 1737.5 [P.O.:90; I.V.:977.5; Blood:670] Out: 600 [Urine:600] Intake/Output this shift: Total I/O In: 112.2 [I.V.:112.2] Out: -   Lab Results: CBC  Recent Labs  08/21/14 1705 08/22/14 0146  WBC 9.0 8.2  HGB 7.0* 8.8*  HCT 23.2* 27.4*  MCV 80.0 81.5  PLT PLATELET CLUMPS NOTED ON SMEAR, COUNT  APPEARS ADEQUATE 317   BMET  Recent Labs  08/21/14 1705  NA 144  K 5.0  CL 110  CO2 21  GLUCOSE 123*  BUN 21  CREATININE 1.78*  CALCIUM 8.7   LFT  Recent Labs  08/21/14 1705  BILITOT 0.2*  ALKPHOS 144*  AST 47*  ALT 54*  PROT 5.6*  ALBUMIN 2.7*    Lipase No results for input(s): LIPASE in the last 72 hours.  PT/INR  Recent Labs  08/21/14 1705  LABPROT 14.0  INR 1.07      Imaging Studies: Dg Knee 1-2 Views Left  08-28-14   CLINICAL DATA:  ORIF distal left femur  EXAM: DG C-ARM 61-120 MIN; LEFT KNEE - 1-2 VIEW  FLUOROSCOPY TIME:  30 seconds  COMPARISON:  None  FINDINGS: Comminuted distal femoral diametaphyseal fracture transfixed by a lateral sideplate with multiple interlocking screws. The fracture is near anatomic alignment. There are postsurgical changes in the surrounding soft tissues.  There is peripheral vascular atherosclerotic disease.  IMPRESSION: ORIF distal left femoral fracture.   Electronically Signed   By: Kathreen Devoid   On: 08/28/2014 17:59   Dg Knee 1-2 Views Left  08/05/2014   CLINICAL DATA:  Patient fell down steps  EXAM: LEFT KNEE - 1-2 VIEW  COMPARISON:  None.  FINDINGS: Frontal and  lateral views were obtained. There is a comminuted fracture of the distal femoral metaphysis with posterior angulation and displacement of the distal major fracture fragment with respect proximal fragment. No other fractures. No frank dislocation. There is mild narrowing of the patellofemoral joint. There is extensive arterial vascular calcification.  IMPRESSION: Comminuted fracture distal femoral metaphysis with angulation and displacement at the fracture site. No dislocation.   Electronically Signed   By: Lowella Grip M.D.   On: 08/05/2014 08:54   Ct Knee Left Wo Contrast  08/06/2014   CLINICAL DATA:  Status post fall, left femoral fracture  EXAM: CT OF THE LEFT KNEE WITHOUT CONTRAST  TECHNIQUE: Multidetector CT imaging of the LEFT knee was performed according to the standard protocol. Multiplanar CT image reconstructions were also generated.  COMPARISON:  None.  FINDINGS: There is a comminuted and impacted distal femoral metaphysis fracture. The fracture cleft extends to the trochlear groove and intercondylar notch. There is a fracture cleft at the a ACL origin. There is approximately 12.5 mm of diastases at the trochlea. There is apex anterior angulation. There is mild apex medial angulation. There is no other fracture or dislocation. There is no significant joint effusion.  There is no soft tissue mass, hematoma or fluid collection. There is mild edema in the popliteal fossa. There is peripheral vascular atherosclerotic disease.  IMPRESSION: Comminuted, displaced, angulated and impacted distal femoral metaphysis fracture as described above.   Electronically Signed   By: Kathreen Devoid   On: 08/06/2014 07:59   US Renal  08/08/2014   CLINICAL DATA:  Acute kidney injury.  EXAM: RENAL/URINARY TRACT ULTRASOUND COMPLETE  COMPARISON:  None.  FINDINGS: Right Kidney:  Length: 7.8 cm. Parenchymal echogenicity is increased. An anechoic or nearly anechoic lesion off the upper pole measures 1.1 x 1.3 x 0.9 cm, most  consistent with a cyst. No hydronephrosis.  Left Kidney:  Length: 8.8 cm. Parenchymal echogenicity is increased. An anechoic or nearly anechoic lesion in the region of the junction of the mid and lower poles measures 1.1 x 1.1 x 0.8 cm, also most consistent with a cyst. No hydronephrosis.  Bladder:  Foley catheter is seen  in a decompressed bladder.  IMPRESSION: 1. No acute findings. 2. Increased renal parenchymal echogenicity is indicative of chronic medical renal disease.   Electronically Signed   By: Lorin Picket M.D.   On: 08/08/2014 14:13   Dg Pelvis Portable  08/05/2014   CLINICAL DATA:  Distal right femur fracture secondary to a fall down steps today.  EXAM: PORTABLE PELVIS 1-2 VIEWS  COMPARISON:  None.  FINDINGS: There is no evidence of pelvic fracture or diastasis. No pelvic bone lesions are seen. Calcified fibroid in the uterus. Bowel gas pattern is normal.  IMPRESSION: Normal pelvic bones.  No acute abnormalities.   Electronically Signed   By: Rozetta Nunnery M.D.   On: 08/05/2014 09:57   Dg Chest Port 1 View  08/06/2014   CLINICAL DATA:  Fever.  Subsequent encounter.  EXAM: PORTABLE CHEST - 1 VIEW  COMPARISON:  08/05/2014.  FINDINGS: Development of RIGHT perihilar airspace disease. This appears to involve all lobes of the RIGHT lung. In a patient with fever, this is suspicious for multifocal pneumonia. Aspiration pneumonitis or asymmetric pulmonary edema are less likely.  The cardiopericardial silhouette is within normal limits. The LEFT lung appears unchanged, without airspace disease. No effusion. Monitoring leads project over the chest.  Aortic arch atherosclerosis. Asymmetric pleural apical thickening is present, greater on the LEFT than RIGHT.  IMPRESSION: Development of RIGHT perihilar airspace disease favored to represent pneumonia.   Electronically Signed   By: Dereck Ligas M.D.   On: 08/06/2014 10:29   Dg Chest Portable 1 View  08/05/2014   CLINICAL DATA:  Pre operative respiratory  exam. Distal left femur fracture.  EXAM: PORTABLE CHEST - 1 VIEW  COMPARISON:  None.  FINDINGS: Heart size is within normal limits considering the AP portable technique. There is slight pulmonary vascular prominence. Slight thickening of the minor fissure on the right. Minimal atelectasis or scarring at the left lung base. Old healed left eighth rib fracture posterior laterally.  The L1 vertebra is slightly sclerotic. This may be due to a prior fracture.  IMPRESSION: Slight pulmonary vascular prominence. Minimal scarring or atelectasis at the left base.   Electronically Signed   By: Rozetta Nunnery M.D.   On: 08/05/2014 10:04   Dg Knee Left Port  08/08/2014   CLINICAL DATA:  Internal fixation of femur fracture.  EXAM: PORTABLE LEFT KNEE - 1-2 VIEW  COMPARISON:  08/05/2014  FINDINGS: Plate and screw fixation traversing a distal femoral fracture is noted, in near-anatomic alignment and position.  No complicating features are identified.  IMPRESSION: Internal fixation of distal femur fracture, in near-anatomic alignment and position.   Electronically Signed   By: Hassan Rowan M.D.   On: 08/08/2014 02:47   Dg C-arm 1-60 Min  08/07/2014   CLINICAL DATA:  ORIF distal left femur  EXAM: DG C-ARM 61-120 MIN; LEFT KNEE - 1-2 VIEW  FLUOROSCOPY TIME:  30 seconds  COMPARISON:  None  FINDINGS: Comminuted distal femoral diametaphyseal fracture transfixed by a lateral sideplate with multiple interlocking screws. The fracture is near anatomic alignment. There are postsurgical changes in the surrounding soft tissues.  There is peripheral vascular atherosclerotic disease.  IMPRESSION: ORIF distal left femoral fracture.   Electronically Signed   By: Kathreen Devoid   On: 08/07/2014 17:59  [4 week]   Impression: 80 y/o female with acute onset GI bleeding, initially reported as brbpr but now with darker blood/clots. She has received 2 units of blood with posttransfusion Hgb pending. Last episode of  bloody stool around 145am. Has been  relatively stable the past several hours. Need to rule out of UGI source. She has been on ASA 325mg  daily. No evidence of chronic liver disease to suspect variceal bleed given normal platelet count and INR. Plan for EGD today.    Plan: 1. EGD today. Discussed with Dr. Oneida Alar. Discussed with nursing, consent to be obtained from appropriate party. 2. IV protonix drip.  3. Keep 2 units of prbcs available. 4. Follow up post-transfusion Hgb and transfuse as appropriate.   We would like to thank you for the opportunity to participate in the care of [place].    LOS: 1 day   Neil Crouch  08/22/2014, 7:49 AM

## 2014-08-23 MED ORDER — METOPROLOL TARTRATE 25 MG PO TABS: 25.0000 mg | ORAL_TABLET | ORAL | Status: DC

## 2014-08-23 MED ORDER — METOPROLOL TARTRATE 25 MG PO TABS: 25.0000 mg | ORAL_TABLET | Freq: Two times a day (BID) | ORAL | Status: DC

## 2014-08-23 MED ORDER — METOPROLOL TARTRATE 25 MG PO TABS
25.0000 mg | ORAL_TABLET | Freq: Two times a day (BID) | ORAL | Status: DC
  Administered 2014-08-23 (×2): 25 mg via ORAL
  Filled 2014-08-23 (×2): qty 1

## 2014-08-23 MED ORDER — PANTOPRAZOLE SODIUM 40 MG PO TBEC: 40.0000 mg | DELAYED_RELEASE_TABLET | Freq: Every day | ORAL | Status: DC

## 2014-08-23 NOTE — Plan of Care (Signed)
NO BRBPR OR MELENA. Pt hemodynamically stable. NO ADDITIONAL WORKUP NEEDED AT THIS TIME. Will SIGN OFF. PLEASE CALL WITH QUESTIONS.

## 2014-08-23 NOTE — Plan of Care (Signed)
Problem: Phase II Progression Outcomes Goal: No active bleeding Outcome: Completed/Met Date Met:  08/23/14 No active bleeding; repeat HGB 12 after 2 units PRBC Goal: Tolerating diet Outcome: Progressing Very poor appetite

## 2014-08-23 NOTE — Discharge Summary (Signed)
Physician Discharge Summary  Alicia Guerra [id number] DOB: 1924/07/03 DOA: 08/21/2014  PCP: [name], MD  Admit date: 08/21/2014 Discharge date: 08/23/2014  Time spent: 35 minutes  Recommendations for Outpatient Follow-up:  1. Please see recommendations and discharge instructions 2. Continue to monitor hemoglobin levels 3. Monitor blood pressures and adjust antihypertensive regimen pending values  Discharge Diagnoses:  Active Problems:   Blood loss anemia   Anemia, iron deficiency   GI bleed   Gastrointestinal hemorrhage with melena   Essential hypertension   Discharge Condition: Stable  Diet recommendation: Dysphagia to  [facility] Weights   08/21/14 2013 08/22/14 0220 08/23/14 0549  Weight: 44.135 kg (97 lb 4.8 oz) 45.7 kg (100 lb 12 oz) 45.7 kg (100 lb 12 oz)    History of present illness:  From original history of present illness: 80 year old lady who presents to the emergency room today with history of rectal bleeding. This started today. She was recently hospitalized in the beginning of December for a left femur fracture when she was also found to have a microcytic anemia with a hemoglobin around 5. She received blood transfusion and the plan was for outpatient investigation of her microcytic anemia by gastroenterology. She underwent surgery for the right femur fracture and was discharged to the [facility] for rehabilitation. She now presents with significantly reduced hemoglobin.   Hospital Course:  GI bleed/Gastrointestinal hemorrhage with melena - GI on board and patient is status post EGD. Recommendations by GI are the following: DAILY PPI FOR GI PROPHYLAXIS CONTINUE ASA HOLD IRON MONITOR FOR RECURRENT BLEEDING OR TRANSFUSION DEPENDENT ANEMIA.  Active Problems:  Blood loss anemia - 2ary to GI loss - No source of bleed identified on EGD: Repeat hemoglobin 12  HTN - Not well controlled - Will place on hydralazine prn  Procedures:  EGD with  the following impressions: Schatzki ring at the gastroesophageal junction 2. Small hiatal hernia 3. NO SOURCE FOR A GI BLEED IDENTIFIED  Consultations:  Gastroenterology  Discharge Exam: Filed Vitals:   08/23/14 0900  BP: 158/71  Pulse: 93  Temp:   Resp: 16    General: Patient in no acute distress, resting comfortably and arousable. Cardiovascular: Regular rate and rhythm, no murmurs rubs Respiratory: Clear to auscultation bilaterally  Discharge Instructions You were cared for by a hospitalist during your hospital stay. If you have any questions about your discharge medications or the care you received while you were in the hospital after you are discharged, you can call the unit and asked to speak with the hospitalist on call if the hospitalist that took care of you is not available. Once you are discharged, your primary care physician will handle any further medical issues. Please note that NO REFILLS for any discharge medications will be authorized once you are discharged, as it is imperative that you return to your primary care physician (or establish a relationship with a primary care physician if you do not have one) for your aftercare needs so that they can reassess your need for medications and monitor your lab values.  Discharge Instructions    Call MD for:  severe uncontrolled pain    Complete by:  As directed      Call MD for:  temperature >100.4    Complete by:  As directed      Diet - low sodium heart healthy    Complete by:  As directed      Discharge instructions    Complete by:  As directed  Please have PCP monitor patient at SNF. Obtain repeat CBC within the next week. Patient can follow-up with gastroenterologist for further workup     Increase activity slowly    Complete by:  As directed           Current Discharge Medication List    START taking these medications   Details  pantoprazole (PROTONIX) 40 MG tablet Take 1 tablet (40 mg total) by mouth  daily before breakfast. Qty: 30 tablet, Refills: 0      CONTINUE these medications which have NOT CHANGED   Details  aspirin EC 325 MG EC tablet Take 1 tablet (325 mg total) by mouth daily with breakfast. Qty: 30 tablet, Refills: 0    feeding supplement, ENSURE COMPLETE, (ENSURE COMPLETE) LIQD Take 237 mLs by mouth 2 (two) times daily between meals.    mirtazapine (REMERON) 7.5 MG tablet Take 1 tablet (7.5 mg total) by mouth at bedtime.      STOP taking these medications     ferrous sulfate 325 (65 FE) MG tablet      HYDROcodone-acetaminophen (NORCO/VICODIN) 5-325 MG per tablet        No Known Allergies    The results of significant diagnostics from this hospitalization (including imaging, microbiology, ancillary and laboratory) are listed below for reference.    Significant Diagnostic Studies: Dg Knee 1-2 Views Left  August 29, 2014   CLINICAL DATA:  ORIF distal left femur  EXAM: DG C-ARM 61-120 MIN; LEFT KNEE - 1-2 VIEW  FLUOROSCOPY TIME:  30 seconds  COMPARISON:  None  FINDINGS: Comminuted distal femoral diametaphyseal fracture transfixed by a lateral sideplate with multiple interlocking screws. The fracture is near anatomic alignment. There are postsurgical changes in the surrounding soft tissues.  There is peripheral vascular atherosclerotic disease.  IMPRESSION: ORIF distal left femoral fracture.   Electronically Signed   By: Kathreen Devoid   On: August 29, 2014 17:59   Dg Knee 1-2 Views Left  08/05/2014   CLINICAL DATA:  Patient fell down steps  EXAM: LEFT KNEE - 1-2 VIEW  COMPARISON:  None.  FINDINGS: Frontal and lateral views were obtained. There is a comminuted fracture of the distal femoral metaphysis with posterior angulation and displacement of the distal major fracture fragment with respect proximal fragment. No other fractures. No frank dislocation. There is mild narrowing of the patellofemoral joint. There is extensive arterial vascular calcification.  IMPRESSION: Comminuted  fracture distal femoral metaphysis with angulation and displacement at the fracture site. No dislocation.   Electronically Signed   By: Lowella Grip M.D.   On: 08/05/2014 08:54   Ct Knee Left Wo Contrast  08/06/2014   CLINICAL DATA:  Status post fall, left femoral fracture  EXAM: CT OF THE LEFT KNEE WITHOUT CONTRAST  TECHNIQUE: Multidetector CT imaging of the LEFT knee was performed according to the standard protocol. Multiplanar CT image reconstructions were also generated.  COMPARISON:  None.  FINDINGS: There is a comminuted and impacted distal femoral metaphysis fracture. The fracture cleft extends to the trochlear groove and intercondylar notch. There is a fracture cleft at the a ACL origin. There is approximately 12.5 mm of diastases at the trochlea. There is apex anterior angulation. There is mild apex medial angulation. There is no other fracture or dislocation. There is no significant joint effusion.  There is no soft tissue mass, hematoma or fluid collection. There is mild edema in the popliteal fossa. There is peripheral vascular atherosclerotic disease.  IMPRESSION: Comminuted, displaced, angulated and impacted distal femoral  metaphysis fracture as described above.   Electronically Signed   By: Kathreen Devoid   On: 08/06/2014 07:59   US Renal  08/08/2014   CLINICAL DATA:  Acute kidney injury.  EXAM: RENAL/URINARY TRACT ULTRASOUND COMPLETE  COMPARISON:  None.  FINDINGS: Right Kidney:  Length: 7.8 cm. Parenchymal echogenicity is increased. An anechoic or nearly anechoic lesion off the upper pole measures 1.1 x 1.3 x 0.9 cm, most consistent with a cyst. No hydronephrosis.  Left Kidney:  Length: 8.8 cm. Parenchymal echogenicity is increased. An anechoic or nearly anechoic lesion in the region of the junction of the mid and lower poles measures 1.1 x 1.1 x 0.8 cm, also most consistent with a cyst. No hydronephrosis.  Bladder:  Foley catheter is seen in a decompressed bladder.  IMPRESSION: 1. No acute  findings. 2. Increased renal parenchymal echogenicity is indicative of chronic medical renal disease.   Electronically Signed   By: Lorin Picket M.D.   On: 08/08/2014 14:13   Dg Pelvis Portable  08/05/2014   CLINICAL DATA:  Distal right femur fracture secondary to a fall down steps today.  EXAM: PORTABLE PELVIS 1-2 VIEWS  COMPARISON:  None.  FINDINGS: There is no evidence of pelvic fracture or diastasis. No pelvic bone lesions are seen. Calcified fibroid in the uterus. Bowel gas pattern is normal.  IMPRESSION: Normal pelvic bones.  No acute abnormalities.   Electronically Signed   By: Rozetta Nunnery M.D.   On: 08/05/2014 09:57   Dg Chest Port 1 View  08/06/2014   CLINICAL DATA:  Fever.  Subsequent encounter.  EXAM: PORTABLE CHEST - 1 VIEW  COMPARISON:  08/05/2014.  FINDINGS: Development of RIGHT perihilar airspace disease. This appears to involve all lobes of the RIGHT lung. In a patient with fever, this is suspicious for multifocal pneumonia. Aspiration pneumonitis or asymmetric pulmonary edema are less likely.  The cardiopericardial silhouette is within normal limits. The LEFT lung appears unchanged, without airspace disease. No effusion. Monitoring leads project over the chest.  Aortic arch atherosclerosis. Asymmetric pleural apical thickening is present, greater on the LEFT than RIGHT.  IMPRESSION: Development of RIGHT perihilar airspace disease favored to represent pneumonia.   Electronically Signed   By: Dereck Ligas M.D.   On: 08/06/2014 10:29   Dg Chest Portable 1 View  08/05/2014   CLINICAL DATA:  Pre operative respiratory exam. Distal left femur fracture.  EXAM: PORTABLE CHEST - 1 VIEW  COMPARISON:  None.  FINDINGS: Heart size is within normal limits considering the AP portable technique. There is slight pulmonary vascular prominence. Slight thickening of the minor fissure on the right. Minimal atelectasis or scarring at the left lung base. Old healed left eighth rib fracture posterior  laterally.  The L1 vertebra is slightly sclerotic. This may be due to a prior fracture.  IMPRESSION: Slight pulmonary vascular prominence. Minimal scarring or atelectasis at the left base.   Electronically Signed   By: Rozetta Nunnery M.D.   On: 08/05/2014 10:04   Dg Knee Left Port  08/08/2014   CLINICAL DATA:  Internal fixation of femur fracture.  EXAM: PORTABLE LEFT KNEE - 1-2 VIEW  COMPARISON:  08/05/2014  FINDINGS: Plate and screw fixation traversing a distal femoral fracture is noted, in near-anatomic alignment and position.  No complicating features are identified.  IMPRESSION: Internal fixation of distal femur fracture, in near-anatomic alignment and position.   Electronically Signed   By: Hassan Rowan M.D.   On: 08/08/2014 02:47   Dg  C-arm 1-60 Min  08/07/2014   CLINICAL DATA:  ORIF distal left femur  EXAM: DG C-ARM 61-120 MIN; LEFT KNEE - 1-2 VIEW  FLUOROSCOPY TIME:  30 seconds  COMPARISON:  None  FINDINGS: Comminuted distal femoral diametaphyseal fracture transfixed by a lateral sideplate with multiple interlocking screws. The fracture is near anatomic alignment. There are postsurgical changes in the surrounding soft tissues.  There is peripheral vascular atherosclerotic disease.  IMPRESSION: ORIF distal left femoral fracture.   Electronically Signed   By: Kathreen Devoid   On: 08/07/2014 17:59    Microbiology: Recent Results (from the past 240 hour(s))  MRSA PCR Screening     Status: None   Collection Time: 08/22/14  2:18 AM  Result Value Ref Range Status   MRSA by PCR NEGATIVE NEGATIVE Final    Comment:        The GeneXpert MRSA Assay (FDA approved for NASAL specimens only), is one component of a comprehensive MRSA colonization surveillance program. It is not intended to diagnose MRSA infection nor to guide or monitor treatment for MRSA infections.      Labs: Basic Metabolic Panel:  Recent Labs Lab 08/21/14 1705 08/22/14 0914  NA 144 143  K 5.0 4.3  CL 110 110  CO2 21 19   GLUCOSE 123* 92  BUN 21 17  CREATININE 1.78* 1.57*  CALCIUM 8.7 8.5   Liver Function Tests:  Recent Labs Lab 08/21/14 1705 08/22/14 0914  AST 47* 37  ALT 54* 45*  ALKPHOS 144* 153*  BILITOT 0.2* 0.9  PROT 5.6* 5.8*  ALBUMIN 2.7* 2.8*   No results for input(s): LIPASE, AMYLASE in the last 168 hours. No results for input(s): AMMONIA in the last 168 hours. CBC:  Recent Labs Lab 08/21/14 1705 08/22/14 0146 08/22/14 0914  WBC 9.0 8.2 10.5  NEUTROABS 7.7  --   --   HGB 7.0* 8.8* 12.0  HCT 23.2* 27.4* 37.1  MCV 80.0 81.5 83.2  PLT PLATELET CLUMPS NOTED ON SMEAR, COUNT APPEARS ADEQUATE 317 306   Cardiac Enzymes: No results for input(s): CKTOTAL, CKMB, CKMBINDEX, TROPONINI in the last 168 hours. BNP: BNP (last 3 results) No results for input(s): PROBNP in the last 8760 hours. CBG: No results for input(s): GLUCAP in the last 168 hours.     Signed:  Velvet Bathe  [facility] 08/23/2014, 10:13 AM

## 2014-08-24 DIAGNOSIS — I10 Essential (primary) hypertension: Secondary | ICD-10-CM

## 2014-08-24 LAB — CBC
HEMATOCRIT: 40.7 % (ref 36.0–46.0)
Hemoglobin: 13.1 g/dL (ref 12.0–15.0)
MCH: 27.2 pg (ref 26.0–34.0)
MCHC: 32.2 g/dL (ref 30.0–36.0)
MCV: 84.6 fL (ref 78.0–100.0)
Platelets: 368 10*3/uL (ref 150–400)
RBC: 4.81 MIL/uL (ref 3.87–5.11)
RDW: 21.3 % — ABNORMAL HIGH (ref 11.5–15.5)
WBC: 10.1 10*3/uL (ref 4.0–10.5)

## 2014-08-24 MED ORDER — FUROSEMIDE 10 MG/ML IJ SOLN
20.0000 mg | Freq: Once | INTRAMUSCULAR | Status: AC
  Administered 2014-08-24: 20 mg via INTRAVENOUS
  Filled 2014-08-24: qty 2

## 2014-08-24 MED ORDER — HYDRALAZINE HCL 25 MG PO TABS
25.0000 mg | ORAL_TABLET | Freq: Three times a day (TID) | ORAL | Status: DC
  Administered 2014-08-24 – 2014-08-25 (×5): 25 mg via ORAL
  Filled 2014-08-24 (×5): qty 1

## 2014-08-24 MED ORDER — METOPROLOL TARTRATE 50 MG PO TABS
50.0000 mg | ORAL_TABLET | Freq: Two times a day (BID) | ORAL | Status: DC
  Administered 2014-08-24 – 2014-08-25 (×3): 50 mg via ORAL
  Filled 2014-08-24 (×3): qty 1

## 2014-08-24 MED ORDER — AMLODIPINE BESYLATE 5 MG PO TABS
10.0000 mg | ORAL_TABLET | Freq: Every day | ORAL | Status: DC
  Administered 2014-08-24 – 2014-08-25 (×2): 10 mg via ORAL
  Filled 2014-08-24 (×2): qty 2

## 2014-08-24 NOTE — Progress Notes (Signed)
Pt transferred to telemetry; report called to [place], RN on 300. Pt left in stable condition, with belongings. Transported by RN and NT via bed. Pts niece notified.

## 2014-08-24 NOTE — Progress Notes (Signed)
[facility] PROGRESS NOTE  Alicia Guerra [id number] DOB: 1924/06/25 DOA: 08/21/2014 PCP: [name], MD  Assessment/Plan: 1. GI bleeding with melena. Patient was seen by gastroenterology and underwent upper endoscopy without any clear source of bleeding identified. She was transfused 2 units PRBCs and was started on Protonix. She's not had any further gross bleeding since then. Her posttransfusion hemoglobin was improved. This will be repeated today. She will need to be monitored for recurrent bleeding. 2. Anemia, felt to be related to blood loss. Likely related to GI bleeding. Continue to follow hemoglobin. 3. Uncontrolled hypertension. Increase beta blocker and start oral hydralazine. 4. Acute renal failure. Creatinine appears to be improving over the past several weeks. Continue to follow. 5. Recent left hip fracture. She will need continued physical therapy.  Code Status: full code Family Communication: no family present Disposition Plan: arrangements for return to SNF are being made   Consultants:  gastroenterology  Procedures: Endoscopy: 12/18: ENDOSCOPIC IMPRESSION: 1. Schatzki ring at the gastroesophageal junction 2. Small hiatal hernia  3. NO SOURCE FOR A GI BLEED IDENTIFIED  Antibiotics:    HPI/Subjective: No new complaints, no shortness of breath or chest pain  Objective: Filed Vitals:   08/24/14 0800  BP: 168/60  Pulse: 97  Temp:   Resp: 18    Intake/Output Summary (Last 24 hours) at 08/24/14 0931 Last data filed at 08/24/14 0800  Gross per 24 hour  Intake 2265.16 ml  Output    575 ml  Net 1690.16 ml   Filed Weights   08/21/14 2013 08/22/14 0220 08/23/14 0549  Weight: 44.135 kg (97 lb 4.8 oz) 45.7 kg (100 lb 12 oz) 45.7 kg (100 lb 12 oz)    Exam:   General:  NAD  Cardiovascular: S1, S2 RRR  Respiratory: crackles at bases  Abdomen: soft, nt, nd, bs+  Musculoskeletal: left leg in brace with mild edema, no edema in  right leg   Data Reviewed: Basic Metabolic Panel:  Recent Labs Lab 08/21/14 1705 08/22/14 0914  NA 144 143  K 5.0 4.3  CL 110 110  CO2 21 19  GLUCOSE 123* 92  BUN 21 17  CREATININE 1.78* 1.57*  CALCIUM 8.7 8.5   Liver Function Tests:  Recent Labs Lab 08/21/14 1705 08/22/14 0914  AST 47* 37  ALT 54* 45*  ALKPHOS 144* 153*  BILITOT 0.2* 0.9  PROT 5.6* 5.8*  ALBUMIN 2.7* 2.8*   No results for input(s): LIPASE, AMYLASE in the last 168 hours. No results for input(s): AMMONIA in the last 168 hours. CBC:  Recent Labs Lab 08/21/14 1705 08/22/14 0146 08/22/14 0914  WBC 9.0 8.2 10.5  NEUTROABS 7.7  --   --   HGB 7.0* 8.8* 12.0  HCT 23.2* 27.4* 37.1  MCV 80.0 81.5 83.2  PLT PLATELET CLUMPS NOTED ON SMEAR, COUNT APPEARS ADEQUATE 317 306   Cardiac Enzymes: No results for input(s): CKTOTAL, CKMB, CKMBINDEX, TROPONINI in the last 168 hours. BNP (last 3 results) No results for input(s): PROBNP in the last 8760 hours. CBG: No results for input(s): GLUCAP in the last 168 hours.  Recent Results (from the past 240 hour(s))  MRSA PCR Screening     Status: None   Collection Time: 08/22/14  2:18 AM  Result Value Ref Range Status   MRSA by PCR NEGATIVE NEGATIVE Final    Comment:        The GeneXpert MRSA Assay (FDA approved for NASAL specimens only), is one component of a comprehensive MRSA  colonization surveillance program. It is not intended to diagnose MRSA infection nor to guide or monitor treatment for MRSA infections.      Studies: No results found.  Scheduled Meds: . sodium chloride   Intravenous Once  . sodium chloride   Intravenous Once  . amLODipine  10 mg Oral Daily  . feeding supplement (ENSURE COMPLETE)  237 mL Oral BID BM  . furosemide  20 mg Intravenous Once  . metoprolol tartrate  25 mg Oral BID  . mirtazapine  7.5 mg Oral QHS  . pantoprazole  40 mg Oral QAC breakfast  . sodium chloride  3 mL Intravenous Q12H   Continuous Infusions: .  sodium chloride 20 mL/hr at 08/24/14 0800    Active Problems:   Blood loss anemia   Anemia, iron deficiency   GI bleed   Gastrointestinal hemorrhage with melena   Essential hypertension    Time spent: 62mins    Copper Kirtley  [facility] Pager [phone]. If 7PM-7AM, please contact night-coverage at [email or web address], password [facility] 08/24/2014, 9:31 AM  LOS: 3 days

## 2014-08-25 ENCOUNTER — Inpatient Hospital Stay
Admission: RE | Admit: 2014-08-25 | Discharge: 2014-09-26 | Disposition: A | Discharge status: Other Acute Inpatient Hospital | Payer: Medicare HMO | Source: Ambulatory Visit | Attending: Internal Medicine | Admitting: Internal Medicine

## 2014-08-25 ENCOUNTER — Encounter (HOSPITAL_COMMUNITY): Payer: Self-pay | Admitting: Gastroenterology

## 2014-08-25 DIAGNOSIS — R7611 Nonspecific reaction to tuberculin skin test without active tuberculosis: Principal | ICD-10-CM

## 2014-08-25 LAB — TYPE AND SCREEN
ABO/RH(D): O POS
Antibody Screen: NEGATIVE
UNIT DIVISION: 0
UNIT DIVISION: 0
Unit division: 0
Unit division: 0

## 2014-08-25 MED ORDER — METOPROLOL TARTRATE 50 MG PO TABS: 50.0000 mg | ORAL_TABLET | Freq: Two times a day (BID) | ORAL | Status: DC

## 2014-08-25 MED ORDER — AMLODIPINE BESYLATE 10 MG PO TABS: 10.0000 mg | ORAL_TABLET | Freq: Every day | ORAL | Status: DC

## 2014-08-25 MED ORDER — HYDRALAZINE HCL 25 MG PO TABS: 50.0000 mg | ORAL_TABLET | Freq: Three times a day (TID) | ORAL | Status: DC

## 2014-08-25 NOTE — Clinical Social Work Note (Addendum)
CSW spoke with Keri at [facility] who indicated that patient could return to the facility and that she would begin seeking authorization from [facility]   CSW notified patient's niece, [name], that patient was being discharged today and returning to [facility]. Ms. Berenice Primas indicated that she was pleased that patient would be returning to [facility].  Ambrose Pancoast, [place]

## 2014-08-25 NOTE — Progress Notes (Signed)
Report called to Gilmer Mor at [facility] center. Patient ready for transport.

## 2014-08-25 NOTE — Discharge Summary (Addendum)
Physician Discharge Summary  Alicia Guerra [id number] DOB: 09/19/1923 DOA: 08/21/2014  PCP: [name], MD  Admit date: 08/21/2014 Discharge date: 08/25/2014  Time spent: 40 minutes  Recommendations for Outpatient Follow-up:  1. Return to [facility] for rehab 2. Follow up with primary care doctor in 1-2 weeks  Discharge Diagnoses:  Active Problems:   Blood loss anemia   Anemia, iron deficiency   GI bleed   Gastrointestinal hemorrhage with melena   Essential hypertension Severe malnutrition  Discharge Condition: improved  Diet recommendation: low salt  Filed Weights   08/21/14 2013 08/22/14 0220 08/23/14 0549  Weight: 44.135 kg (97 lb 4.8 oz) 45.7 kg (100 lb 12 oz) 45.7 kg (100 lb 12 oz)    History of present illness:  This is an 80 year old female who presents to the emergency room with complaints of rectal bleeding. Hemoglobin on admission was 7.0. She was admitted for further workup of GI bleeding.  Hospital Course:  She was seen by gastroenterology and underwent endoscopy with results as below. No clear source of her bleeding was identified. Her bleeding spontaneously resolved and she has not had any further episodes. She was transfused 2 units PRBCs with improvement of her hemoglobin.her hemoglobin today is 13.1. She is tolerating a solid diet. No further workup for bleeding was recommended unless she has any recurrence of bleeding/transfusion dependent anemia. She follow-up with GI as needed as an outpatient. Medical problems remain stable. Her renal function continues to improve after an episode of acute renal failure in the past. Pressure remains uncontrolled and she was started on hydralazine. This needs to be further management in the outpatient setting. She will return to skilled nursing facility for further management.  Procedures: Endoscopy: 12/18: ENDOSCOPIC IMPRESSION: 1. Schatzki ring at the gastroesophageal junction 2. Small hiatal  hernia  3. NO SOURCE FOR A GI BLEED IDENTIFIED  Consultations:  gastroenterology  Discharge Exam: Filed Vitals:   08/25/14 0538  BP: 179/73  Pulse: 88  Temp: 98.3 F (36.8 C)  Resp: 20    General: NAD Cardiovascular: S1, S2 RRR Respiratory: CTA B  Discharge Instructions   Discharge Instructions    Call MD for:  severe uncontrolled pain    Complete by:  As directed      Call MD for:  temperature >100.4    Complete by:  As directed      Diet - low sodium heart healthy    Complete by:  As directed      Diet - low sodium heart healthy    Complete by:  As directed      Discharge instructions    Complete by:  As directed   Please have PCP monitor patient at SNF. Obtain repeat CBC within the next week. Patient can follow-up with gastroenterologist for further workup     Increase activity slowly    Complete by:  As directed      Increase activity slowly    Complete by:  As directed           Current Discharge Medication List    START taking these medications   Details  amLODipine (NORVASC) 10 MG tablet Take 1 tablet (10 mg total) by mouth daily.    hydrALAZINE (APRESOLINE) 25 MG tablet Take 2 tablets (50 mg total) by mouth every 8 (eight) hours.    metoprolol (LOPRESSOR) 50 MG tablet Take 1 tablet (50 mg total) by mouth 2 (two) times daily.    pantoprazole (PROTONIX) 40 MG tablet  Take 1 tablet (40 mg total) by mouth daily before breakfast. Qty: 30 tablet, Refills: 0      CONTINUE these medications which have NOT CHANGED   Details  aspirin EC 325 MG EC tablet Take 1 tablet (325 mg total) by mouth daily with breakfast. Qty: 30 tablet, Refills: 0    feeding supplement, ENSURE COMPLETE, (ENSURE COMPLETE) LIQD Take 237 mLs by mouth 2 (two) times daily between meals.    mirtazapine (REMERON) 7.5 MG tablet Take 1 tablet (7.5 mg total) by mouth at bedtime.      STOP taking these medications     ferrous sulfate 325 (65 FE) MG tablet       HYDROcodone-acetaminophen (NORCO/VICODIN) 5-325 MG per tablet        No Known Allergies    The results of significant diagnostics from this hospitalization (including imaging, microbiology, ancillary and laboratory) are listed below for reference.    Significant Diagnostic Studies: Dg Knee 1-2 Views Left  10-Aug-2014   CLINICAL DATA:  ORIF distal left femur  EXAM: DG C-ARM 61-120 MIN; LEFT KNEE - 1-2 VIEW  FLUOROSCOPY TIME:  30 seconds  COMPARISON:  None  FINDINGS: Comminuted distal femoral diametaphyseal fracture transfixed by a lateral sideplate with multiple interlocking screws. The fracture is near anatomic alignment. There are postsurgical changes in the surrounding soft tissues.  There is peripheral vascular atherosclerotic disease.  IMPRESSION: ORIF distal left femoral fracture.   Electronically Signed   By: Kathreen Devoid   On: 08-10-14 17:59   Dg Knee 1-2 Views Left  08/05/2014   CLINICAL DATA:  Patient fell down steps  EXAM: LEFT KNEE - 1-2 VIEW  COMPARISON:  None.  FINDINGS: Frontal and lateral views were obtained. There is a comminuted fracture of the distal femoral metaphysis with posterior angulation and displacement of the distal major fracture fragment with respect proximal fragment. No other fractures. No frank dislocation. There is mild narrowing of the patellofemoral joint. There is extensive arterial vascular calcification.  IMPRESSION: Comminuted fracture distal femoral metaphysis with angulation and displacement at the fracture site. No dislocation.   Electronically Signed   By: Lowella Grip M.D.   On: 08/05/2014 08:54   Ct Knee Left Wo Contrast  08/06/2014   CLINICAL DATA:  Status post fall, left femoral fracture  EXAM: CT OF THE LEFT KNEE WITHOUT CONTRAST  TECHNIQUE: Multidetector CT imaging of the LEFT knee was performed according to the standard protocol. Multiplanar CT image reconstructions were also generated.  COMPARISON:  None.  FINDINGS: There is a comminuted and  impacted distal femoral metaphysis fracture. The fracture cleft extends to the trochlear groove and intercondylar notch. There is a fracture cleft at the a ACL origin. There is approximately 12.5 mm of diastases at the trochlea. There is apex anterior angulation. There is mild apex medial angulation. There is no other fracture or dislocation. There is no significant joint effusion.  There is no soft tissue mass, hematoma or fluid collection. There is mild edema in the popliteal fossa. There is peripheral vascular atherosclerotic disease.  IMPRESSION: Comminuted, displaced, angulated and impacted distal femoral metaphysis fracture as described above.   Electronically Signed   By: Kathreen Devoid   On: 08/06/2014 07:59   US Renal  08/08/2014   CLINICAL DATA:  Acute kidney injury.  EXAM: RENAL/URINARY TRACT ULTRASOUND COMPLETE  COMPARISON:  None.  FINDINGS: Right Kidney:  Length: 7.8 cm. Parenchymal echogenicity is increased. An anechoic or nearly anechoic lesion off the upper pole measures  1.1 x 1.3 x 0.9 cm, most consistent with a cyst. No hydronephrosis.  Left Kidney:  Length: 8.8 cm. Parenchymal echogenicity is increased. An anechoic or nearly anechoic lesion in the region of the junction of the mid and lower poles measures 1.1 x 1.1 x 0.8 cm, also most consistent with a cyst. No hydronephrosis.  Bladder:  Foley catheter is seen in a decompressed bladder.  IMPRESSION: 1. No acute findings. 2. Increased renal parenchymal echogenicity is indicative of chronic medical renal disease.   Electronically Signed   By: Lorin Picket M.D.   On: 08/08/2014 14:13   Dg Pelvis Portable  08/05/2014   CLINICAL DATA:  Distal right femur fracture secondary to a fall down steps today.  EXAM: PORTABLE PELVIS 1-2 VIEWS  COMPARISON:  None.  FINDINGS: There is no evidence of pelvic fracture or diastasis. No pelvic bone lesions are seen. Calcified fibroid in the uterus. Bowel gas pattern is normal.  IMPRESSION: Normal pelvic bones.   No acute abnormalities.   Electronically Signed   By: Rozetta Nunnery M.D.   On: 08/05/2014 09:57   Dg Chest Port 1 View  08/06/2014   CLINICAL DATA:  Fever.  Subsequent encounter.  EXAM: PORTABLE CHEST - 1 VIEW  COMPARISON:  08/05/2014.  FINDINGS: Development of RIGHT perihilar airspace disease. This appears to involve all lobes of the RIGHT lung. In a patient with fever, this is suspicious for multifocal pneumonia. Aspiration pneumonitis or asymmetric pulmonary edema are less likely.  The cardiopericardial silhouette is within normal limits. The LEFT lung appears unchanged, without airspace disease. No effusion. Monitoring leads project over the chest.  Aortic arch atherosclerosis. Asymmetric pleural apical thickening is present, greater on the LEFT than RIGHT.  IMPRESSION: Development of RIGHT perihilar airspace disease favored to represent pneumonia.   Electronically Signed   By: Dereck Ligas M.D.   On: 08/06/2014 10:29   Dg Chest Portable 1 View  08/05/2014   CLINICAL DATA:  Pre operative respiratory exam. Distal left femur fracture.  EXAM: PORTABLE CHEST - 1 VIEW  COMPARISON:  None.  FINDINGS: Heart size is within normal limits considering the AP portable technique. There is slight pulmonary vascular prominence. Slight thickening of the minor fissure on the right. Minimal atelectasis or scarring at the left lung base. Old healed left eighth rib fracture posterior laterally.  The L1 vertebra is slightly sclerotic. This may be due to a prior fracture.  IMPRESSION: Slight pulmonary vascular prominence. Minimal scarring or atelectasis at the left base.   Electronically Signed   By: Rozetta Nunnery M.D.   On: 08/05/2014 10:04   Dg Knee Left Port  08/08/2014   CLINICAL DATA:  Internal fixation of femur fracture.  EXAM: PORTABLE LEFT KNEE - 1-2 VIEW  COMPARISON:  08/05/2014  FINDINGS: Plate and screw fixation traversing a distal femoral fracture is noted, in near-anatomic alignment and position.  No  complicating features are identified.  IMPRESSION: Internal fixation of distal femur fracture, in near-anatomic alignment and position.   Electronically Signed   By: Hassan Rowan M.D.   On: 08/08/2014 02:47   Dg C-arm 1-60 Min  08/07/2014   CLINICAL DATA:  ORIF distal left femur  EXAM: DG C-ARM 61-120 MIN; LEFT KNEE - 1-2 VIEW  FLUOROSCOPY TIME:  30 seconds  COMPARISON:  None  FINDINGS: Comminuted distal femoral diametaphyseal fracture transfixed by a lateral sideplate with multiple interlocking screws. The fracture is near anatomic alignment. There are postsurgical changes in the surrounding soft tissues.  There is peripheral vascular atherosclerotic disease.  IMPRESSION: ORIF distal left femoral fracture.   Electronically Signed   By: Kathreen Devoid   On: 08/07/2014 17:59    Microbiology: Recent Results (from the past 240 hour(s))  MRSA PCR Screening     Status: None   Collection Time: 08/22/14  2:18 AM  Result Value Ref Range Status   MRSA by PCR NEGATIVE NEGATIVE Final    Comment:        The GeneXpert MRSA Assay (FDA approved for NASAL specimens only), is one component of a comprehensive MRSA colonization surveillance program. It is not intended to diagnose MRSA infection nor to guide or monitor treatment for MRSA infections.      Labs: Basic Metabolic Panel:  Recent Labs Lab 08/21/14 1705 08/22/14 0914  NA 144 143  K 5.0 4.3  CL 110 110  CO2 21 19  GLUCOSE 123* 92  BUN 21 17  CREATININE 1.78* 1.57*  CALCIUM 8.7 8.5   Liver Function Tests:  Recent Labs Lab 08/21/14 1705 08/22/14 0914  AST 47* 37  ALT 54* 45*  ALKPHOS 144* 153*  BILITOT 0.2* 0.9  PROT 5.6* 5.8*  ALBUMIN 2.7* 2.8*   No results for input(s): LIPASE, AMYLASE in the last 168 hours. No results for input(s): AMMONIA in the last 168 hours. CBC:  Recent Labs Lab 08/21/14 1705 08/22/14 0146 08/22/14 0914 08/24/14 0949  WBC 9.0 8.2 10.5 10.1  NEUTROABS 7.7  --   --   --   HGB 7.0* 8.8* 12.0 13.1   HCT 23.2* 27.4* 37.1 40.7  MCV 80.0 81.5 83.2 84.6  PLT PLATELET CLUMPS NOTED ON SMEAR, COUNT APPEARS ADEQUATE 317 306 368   Cardiac Enzymes: No results for input(s): CKTOTAL, CKMB, CKMBINDEX, TROPONINI in the last 168 hours. BNP: BNP (last 3 results) No results for input(s): PROBNP in the last 8760 hours. CBG: No results for input(s): GLUCAP in the last 168 hours.     Signed:  Kj Imbert  [facility] 08/25/2014, 1:11 PM

## 2014-08-26 NOTE — Progress Notes (Signed)
UR chart review completed.  

## 2014-09-01 ENCOUNTER — Telehealth: Payer: Self-pay | Admitting: Internal Medicine

## 2014-09-01 ENCOUNTER — Telehealth: Payer: Self-pay

## 2014-09-01 NOTE — Telephone Encounter (Signed)
Dr. Luan Pulling called. Patient at [facility] center. Recurrent dark blood per rectum. Recent EGD in hospital. Not clear if recent or prior colonoscopy. Bleeding apparently has stopped. Aspirin held ;hemoglobins gone from 12-9.6 according to Dr. Luan Pulling. He asked for further evaluation. We'll get her in the office this week.

## 2014-09-01 NOTE — Telephone Encounter (Signed)
Pts niece called office in regards to pt appt on 09/03/2014. She states she will be coming in with pt. She just wanted to verify time and whom appt was with.

## 2014-09-03 ENCOUNTER — Other Ambulatory Visit: Payer: Self-pay

## 2014-09-03 ENCOUNTER — Ambulatory Visit (INDEPENDENT_AMBULATORY_CARE_PROVIDER_SITE_OTHER): Payer: Medicare HMO | Admitting: Gastroenterology

## 2014-09-03 ENCOUNTER — Encounter: Payer: Self-pay | Admitting: Gastroenterology

## 2014-09-03 VITALS — BP 111/54 | HR 89 | Temp 97.5°F | Ht 59.0 in

## 2014-09-03 DIAGNOSIS — D500 Iron deficiency anemia secondary to blood loss (chronic): Secondary | ICD-10-CM

## 2014-09-03 DIAGNOSIS — D62 Acute posthemorrhagic anemia: Secondary | ICD-10-CM

## 2014-09-03 DIAGNOSIS — K625 Hemorrhage of anus and rectum: Secondary | ICD-10-CM

## 2014-09-03 MED ORDER — PEG 3350-KCL-NA BICARB-NACL 420 G PO SOLR: 4000.0000 mL | Freq: Once | ORAL | Status: DC

## 2014-09-03 NOTE — Progress Notes (Signed)
cc'ed to pcp °

## 2014-09-03 NOTE — Progress Notes (Signed)
Primary Care Physician:  [name], MD  Primary Gastroenterologist:  Barney Drain, MD   Chief Complaint  Patient presents with  . Follow-up    HPI:  Alicia Guerra is a 80 y.o. female here for urgent office visit. She was seen during hospitalization on 08/22/2014 when she presented with rectal bleeding. Noted to have melanotic stool in the ER and large volume dark blood with blood clots after she arrived to the floor. Her hemoglobin was 7. She required 2 units of packed red blood cells. Underwent EGD without anything to explain the bleeding. At time of discharge her hemoglobin was 13.1. She did not undergo any further endoscopic evaluation. Her aspirin has been held since discharge.  Proceed to call from Dr. Luan Pulling stating patient had recurrent dark blood per rectum. Hemoglobin reportedly went down to 9.6. Brought in for urgent office visit. Patient presents with her niece, Alicia Guerra. Patient denies any abdominal pain, constipation, diarrhea, vomiting. Reports her appetite is good but she does not like the food at the facility. Continues to be nonweightbearing with history of left distal femoral metaphysis fracture.  There is no known history of prior colonoscopy per niece. Patient cannot recall. Patient does have some memory issues.    Current Outpatient Prescriptions on File Prior to Visit  Medication Sig Dispense Refill  . amLODipine (NORVASC) 10 MG tablet Take 1 tablet (10 mg total) by mouth daily.    . feeding supplement, ENSURE COMPLETE, (ENSURE COMPLETE) LIQD Take 237 mLs by mouth 2 (two) times daily between meals.    . hydrALAZINE (APRESOLINE) 25 MG tablet Take 2 tablets (50 mg total) by mouth every 8 (eight) hours.    . metoprolol (LOPRESSOR) 50 MG tablet Take 1 tablet (50 mg total) by mouth 2 (two) times daily.    . mirtazapine (REMERON) 7.5 MG tablet Take 1 tablet (7.5 mg total) by mouth at bedtime.    . pantoprazole (PROTONIX) 40 MG tablet Take 1 tablet (40 mg  total) by mouth daily before breakfast. 30 tablet 0   No current facility-administered medications on file prior to visit.     Allergies as of 09/03/2014  . (No Known Allergies)    Past Medical History  Diagnosis Date  . Hypertension   . Arthritis   . Anemia   . History of blood transfusion     "today is the first time" (08/05/2014)    Past Surgical History  Procedure Laterality Date  . Cataract extraction Left   . Tonsillectomy    . Appendectomy    . Cholecystectomy    . Cystectomy      "had cyst taken off lower back"  . Esophagogastroduodenoscopy N/A 08/22/2014    [id number] ring at the gastro junction/small HH  . Orif femur fracture Left 08/07/2014    Procedure: OPEN REDUCTION INTERNAL FIXATION (ORIF)  LEFT FEMUR FRACTURE;  Surgeon: [name], MD;  Location: [place];  Service: Orthopedics;  Laterality: Left;    No family history on file.  History   Social History  . Marital Status: Widowed    Spouse Name: N/A    Number of Children: N/A  . Years of Education: N/A   Occupational History  . Not on file.   Social History Main Topics  . Smoking status: Former Smoker -- 0.25 packs/day for 16 years    Types: Cigarettes  . Smokeless tobacco: Never Used  . Alcohol Use: No  . Drug Use: No  . Sexual Activity: No  Other Topics Concern  . Not on file   Social History Narrative      ROS:  General: Negative for anorexia, weight loss, fever, chills, fatigue, weakness. Eyes: Negative for vision changes.  ENT: Negative for hoarseness, difficulty swallowing , nasal congestion. CV: Negative for chest pain, angina, palpitations, dyspnea on exertion, peripheral edema.  Respiratory: Negative for dyspnea at rest, dyspnea on exertion, cough, sputum, wheezing.  GI: See history of present illness. GU:  Negative for dysuria, hematuria, urinary incontinence, urinary frequency, nocturnal urination.  MS: Left knee pain. No low back pain  Derm: Negative for rash or  itching.  Neuro: Negative for weakness, abnormal sensation, seizure, frequent headaches, positive memory loss, confusion.  Psych: Negative for anxiety, depression, suicidal ideation, hallucinations.  Endo: Negative for unusual weight change.  Heme: Negative for bruising or bleeding. Allergy: Negative for rash or hives.    Physical Examination:  BP 111/54 mmHg  Pulse 89  Temp(Src) 97.5 F (36.4 C) (Oral)  Ht 4\' 11"  (1.499 m)   General: Well-nourished, well-developed in no acute distress. Accompanied by niece. Head: Normocephalic, atraumatic.   Eyes: Conjunctiva pink, no icterus. Mouth: Oropharyngeal mucosa moist and pink , no lesions erythema or exudate. Neck: Supple without thyromegaly, masses, or lymphadenopathy.  Lungs: Clear to auscultation bilaterally.  Heart: Regular rate and rhythm, no murmurs rubs or gallops.  Abdomen: Bowel sounds are normal, nontender, nondistended, no hepatosplenomegaly or masses, no abdominal bruits or    hernia , no rebound or guarding.  Examined in the wheelchair as patient is nonweightbearing. Rectal: Deferred Extremities: No lower extremity edema. No clubbing or deformities.  Neuro: Alert and oriented x 4 , grossly normal neurologically.  Skin: Warm and dry, no rash or jaundice.   Psych: Alert and cooperative, normal mood and affect.  Labs: Requested  Imaging Studies: Dg Knee 1-2 Views Left  August 19, 2014   CLINICAL DATA:  ORIF distal left femur  EXAM: DG C-ARM 61-120 MIN; LEFT KNEE - 1-2 VIEW  FLUOROSCOPY TIME:  30 seconds  COMPARISON:  None  FINDINGS: Comminuted distal femoral diametaphyseal fracture transfixed by a lateral sideplate with multiple interlocking screws. The fracture is near anatomic alignment. There are postsurgical changes in the surrounding soft tissues.  There is peripheral vascular atherosclerotic disease.  IMPRESSION: ORIF distal left femoral fracture.   Electronically Signed   By: Kathreen Devoid   On: 2014-08-19 17:59   Dg Knee  1-2 Views Left  08/05/2014   CLINICAL DATA:  Patient fell down steps  EXAM: LEFT KNEE - 1-2 VIEW  COMPARISON:  None.  FINDINGS: Frontal and lateral views were obtained. There is a comminuted fracture of the distal femoral metaphysis with posterior angulation and displacement of the distal major fracture fragment with respect proximal fragment. No other fractures. No frank dislocation. There is mild narrowing of the patellofemoral joint. There is extensive arterial vascular calcification.  IMPRESSION: Comminuted fracture distal femoral metaphysis with angulation and displacement at the fracture site. No dislocation.   Electronically Signed   By: Lowella Grip M.D.   On: 08/05/2014 08:54   Ct Knee Left Wo Contrast  08/06/2014   CLINICAL DATA:  Status post fall, left femoral fracture  EXAM: CT OF THE LEFT KNEE WITHOUT CONTRAST  TECHNIQUE: Multidetector CT imaging of the LEFT knee was performed according to the standard protocol. Multiplanar CT image reconstructions were also generated.  COMPARISON:  None.  FINDINGS: There is a comminuted and impacted distal femoral metaphysis fracture. The fracture cleft extends to the  trochlear groove and intercondylar notch. There is a fracture cleft at the a ACL origin. There is approximately 12.5 mm of diastases at the trochlea. There is apex anterior angulation. There is mild apex medial angulation. There is no other fracture or dislocation. There is no significant joint effusion.  There is no soft tissue mass, hematoma or fluid collection. There is mild edema in the popliteal fossa. There is peripheral vascular atherosclerotic disease.  IMPRESSION: Comminuted, displaced, angulated and impacted distal femoral metaphysis fracture as described above.   Electronically Signed   By: Kathreen Devoid   On: 08/06/2014 07:59   US Renal  08/08/2014   CLINICAL DATA:  Acute kidney injury.  EXAM: RENAL/URINARY TRACT ULTRASOUND COMPLETE  COMPARISON:  None.  FINDINGS: Right Kidney:   Length: 7.8 cm. Parenchymal echogenicity is increased. An anechoic or nearly anechoic lesion off the upper pole measures 1.1 x 1.3 x 0.9 cm, most consistent with a cyst. No hydronephrosis.  Left Kidney:  Length: 8.8 cm. Parenchymal echogenicity is increased. An anechoic or nearly anechoic lesion in the region of the junction of the mid and lower poles measures 1.1 x 1.1 x 0.8 cm, also most consistent with a cyst. No hydronephrosis.  Bladder:  Foley catheter is seen in a decompressed bladder.  IMPRESSION: 1. No acute findings. 2. Increased renal parenchymal echogenicity is indicative of chronic medical renal disease.   Electronically Signed   By: Lorin Picket M.D.   On: 08/08/2014 14:13   Dg Pelvis Portable  08/05/2014   CLINICAL DATA:  Distal right femur fracture secondary to a fall down steps today.  EXAM: PORTABLE PELVIS 1-2 VIEWS  COMPARISON:  None.  FINDINGS: There is no evidence of pelvic fracture or diastasis. No pelvic bone lesions are seen. Calcified fibroid in the uterus. Bowel gas pattern is normal.  IMPRESSION: Normal pelvic bones.  No acute abnormalities.   Electronically Signed   By: Rozetta Nunnery M.D.   On: 08/05/2014 09:57   Dg Chest Port 1 View  08/06/2014   CLINICAL DATA:  Fever.  Subsequent encounter.  EXAM: PORTABLE CHEST - 1 VIEW  COMPARISON:  08/05/2014.  FINDINGS: Development of RIGHT perihilar airspace disease. This appears to involve all lobes of the RIGHT lung. In a patient with fever, this is suspicious for multifocal pneumonia. Aspiration pneumonitis or asymmetric pulmonary edema are less likely.  The cardiopericardial silhouette is within normal limits. The LEFT lung appears unchanged, without airspace disease. No effusion. Monitoring leads project over the chest.  Aortic arch atherosclerosis. Asymmetric pleural apical thickening is present, greater on the LEFT than RIGHT.  IMPRESSION: Development of RIGHT perihilar airspace disease favored to represent pneumonia.   Electronically  Signed   By: Dereck Ligas M.D.   On: 08/06/2014 10:29   Dg Chest Portable 1 View  08/05/2014   CLINICAL DATA:  Pre operative respiratory exam. Distal left femur fracture.  EXAM: PORTABLE CHEST - 1 VIEW  COMPARISON:  None.  FINDINGS: Heart size is within normal limits considering the AP portable technique. There is slight pulmonary vascular prominence. Slight thickening of the minor fissure on the right. Minimal atelectasis or scarring at the left lung base. Old healed left eighth rib fracture posterior laterally.  The L1 vertebra is slightly sclerotic. This may be due to a prior fracture.  IMPRESSION: Slight pulmonary vascular prominence. Minimal scarring or atelectasis at the left base.   Electronically Signed   By: Rozetta Nunnery M.D.   On: 08/05/2014 10:04   Dg  Knee Left Port  08/08/2014   CLINICAL DATA:  Internal fixation of femur fracture.  EXAM: PORTABLE LEFT KNEE - 1-2 VIEW  COMPARISON:  08/05/2014  FINDINGS: Plate and screw fixation traversing a distal femoral fracture is noted, in near-anatomic alignment and position.  No complicating features are identified.  IMPRESSION: Internal fixation of distal femur fracture, in near-anatomic alignment and position.   Electronically Signed   By: Hassan Rowan M.D.   On: 08/08/2014 02:47   Dg C-arm 1-60 Min  08/07/2014   CLINICAL DATA:  ORIF distal left femur  EXAM: DG C-ARM 61-120 MIN; LEFT KNEE - 1-2 VIEW  FLUOROSCOPY TIME:  30 seconds  COMPARISON:  None  FINDINGS: Comminuted distal femoral diametaphyseal fracture transfixed by a lateral sideplate with multiple interlocking screws. The fracture is near anatomic alignment. There are postsurgical changes in the surrounding soft tissues.  There is peripheral vascular atherosclerotic disease.  IMPRESSION: ORIF distal left femoral fracture.   Electronically Signed   By: Kathreen Devoid   On: 08/07/2014 17:59

## 2014-09-03 NOTE — Patient Instructions (Signed)
1. Colonoscopy with Dr. Oneida Alar. See separate instructions.  2. Please fax last two CBCs or Hemoglobins to [phone].

## 2014-09-03 NOTE — Assessment & Plan Note (Signed)
80 year old lady with history of transfusion dependent anemia during recent hospitalization when she presented with acute onset GI bleeding. EGD during recent hospitalization with no significant findings. She required 2 units of packed red blood cells during recent hospitalization. Presents with recurrent rectal bleeding and reported drop in her hemoglobin from 12-9.6. No history of prior colonoscopy. Recommend colonoscopy for further evaluation of GI bleeding with Dr. Oneida Alar in near future.  Discussed at length with patient and patient's niece.  I have discussed the risks, alternatives, benefits with regards to but not limited to the risk of reaction to medication, bleeding, infection, perforation and the patient is agreeable to proceed. Written consent to be obtained.  Requested last Hgb for review.

## 2014-09-05 DIAGNOSIS — Z01810 Encounter for preprocedural cardiovascular examination: Secondary | ICD-10-CM | POA: Diagnosis not present

## 2014-09-05 DIAGNOSIS — N183 Chronic kidney disease, stage 3 (moderate): Secondary | ICD-10-CM | POA: Diagnosis not present

## 2014-09-05 DIAGNOSIS — K921 Melena: Secondary | ICD-10-CM | POA: Diagnosis not present

## 2014-09-05 DIAGNOSIS — K219 Gastro-esophageal reflux disease without esophagitis: Secondary | ICD-10-CM | POA: Diagnosis not present

## 2014-09-05 DIAGNOSIS — Z87891 Personal history of nicotine dependence: Secondary | ICD-10-CM | POA: Diagnosis not present

## 2014-09-05 DIAGNOSIS — E43 Unspecified severe protein-calorie malnutrition: Secondary | ICD-10-CM | POA: Diagnosis not present

## 2014-09-05 DIAGNOSIS — Z01818 Encounter for other preprocedural examination: Secondary | ICD-10-CM | POA: Diagnosis not present

## 2014-09-05 DIAGNOSIS — K589 Irritable bowel syndrome without diarrhea: Secondary | ICD-10-CM | POA: Diagnosis not present

## 2014-09-05 DIAGNOSIS — C182 Malignant neoplasm of ascending colon: Secondary | ICD-10-CM | POA: Diagnosis not present

## 2014-09-05 DIAGNOSIS — J90 Pleural effusion, not elsewhere classified: Secondary | ICD-10-CM | POA: Diagnosis not present

## 2014-09-05 DIAGNOSIS — S72452D Displaced supracondylar fracture without intracondylar extension of lower end of left femur, subsequent encounter for closed fracture with routine healing: Secondary | ICD-10-CM | POA: Diagnosis not present

## 2014-09-05 DIAGNOSIS — R488 Other symbolic dysfunctions: Secondary | ICD-10-CM | POA: Diagnosis not present

## 2014-09-05 DIAGNOSIS — I129 Hypertensive chronic kidney disease with stage 1 through stage 4 chronic kidney disease, or unspecified chronic kidney disease: Secondary | ICD-10-CM | POA: Diagnosis not present

## 2014-09-05 DIAGNOSIS — M1990 Unspecified osteoarthritis, unspecified site: Secondary | ICD-10-CM | POA: Diagnosis not present

## 2014-09-05 DIAGNOSIS — R262 Difficulty in walking, not elsewhere classified: Secondary | ICD-10-CM | POA: Diagnosis not present

## 2014-09-05 DIAGNOSIS — M6281 Muscle weakness (generalized): Secondary | ICD-10-CM | POA: Diagnosis not present

## 2014-09-05 DIAGNOSIS — K868 Other specified diseases of pancreas: Secondary | ICD-10-CM | POA: Diagnosis not present

## 2014-09-05 DIAGNOSIS — Z9181 History of falling: Secondary | ICD-10-CM | POA: Diagnosis not present

## 2014-09-05 DIAGNOSIS — S82899A Other fracture of unspecified lower leg, initial encounter for closed fracture: Secondary | ICD-10-CM | POA: Diagnosis not present

## 2014-09-05 DIAGNOSIS — R933 Abnormal findings on diagnostic imaging of other parts of digestive tract: Secondary | ICD-10-CM | POA: Diagnosis not present

## 2014-09-05 DIAGNOSIS — K639 Disease of intestine, unspecified: Secondary | ICD-10-CM | POA: Diagnosis not present

## 2014-09-05 DIAGNOSIS — N189 Chronic kidney disease, unspecified: Secondary | ICD-10-CM | POA: Diagnosis not present

## 2014-09-05 DIAGNOSIS — C189 Malignant neoplasm of colon, unspecified: Secondary | ICD-10-CM | POA: Diagnosis not present

## 2014-09-05 DIAGNOSIS — I10 Essential (primary) hypertension: Secondary | ICD-10-CM | POA: Diagnosis not present

## 2014-09-05 DIAGNOSIS — Z9889 Other specified postprocedural states: Secondary | ICD-10-CM | POA: Diagnosis not present

## 2014-09-05 DIAGNOSIS — K5730 Diverticulosis of large intestine without perforation or abscess without bleeding: Secondary | ICD-10-CM | POA: Diagnosis not present

## 2014-09-05 DIAGNOSIS — Z79899 Other long term (current) drug therapy: Secondary | ICD-10-CM | POA: Diagnosis not present

## 2014-09-05 DIAGNOSIS — R278 Other lack of coordination: Secondary | ICD-10-CM | POA: Diagnosis not present

## 2014-09-05 DIAGNOSIS — D128 Benign neoplasm of rectum: Secondary | ICD-10-CM | POA: Diagnosis not present

## 2014-09-05 DIAGNOSIS — D649 Anemia, unspecified: Secondary | ICD-10-CM | POA: Diagnosis not present

## 2014-09-05 DIAGNOSIS — R938 Abnormal findings on diagnostic imaging of other specified body structures: Secondary | ICD-10-CM | POA: Diagnosis not present

## 2014-09-05 DIAGNOSIS — L988 Other specified disorders of the skin and subcutaneous tissue: Secondary | ICD-10-CM | POA: Diagnosis not present

## 2014-09-05 DIAGNOSIS — D500 Iron deficiency anemia secondary to blood loss (chronic): Secondary | ICD-10-CM | POA: Diagnosis not present

## 2014-09-05 DIAGNOSIS — I517 Cardiomegaly: Secondary | ICD-10-CM | POA: Diagnosis not present

## 2014-09-05 DIAGNOSIS — K621 Rectal polyp: Secondary | ICD-10-CM | POA: Diagnosis not present

## 2014-09-05 DIAGNOSIS — J9811 Atelectasis: Secondary | ICD-10-CM | POA: Diagnosis not present

## 2014-09-11 ENCOUNTER — Inpatient Hospital Stay (HOSPITAL_COMMUNITY)
Admit: 2014-09-11 | Discharge: 2014-09-11 | Disposition: A | Discharge status: Home / Self Care | Payer: Medicare HMO | Attending: Internal Medicine | Admitting: Internal Medicine

## 2014-09-11 DIAGNOSIS — J9811 Atelectasis: Secondary | ICD-10-CM | POA: Diagnosis not present

## 2014-09-12 ENCOUNTER — Telehealth: Payer: Self-pay | Admitting: Gastroenterology

## 2014-09-12 NOTE — Telephone Encounter (Signed)
Please request last two CBCs and last LFTs from nursing home ASAP.

## 2014-09-15 NOTE — Telephone Encounter (Signed)
Requested last from nursing home and PCP first thing Monday morning and marked as ASAP

## 2014-09-23 ENCOUNTER — Encounter (HOSPITAL_COMMUNITY): Payer: Self-pay | Admitting: Orthopedic Surgery

## 2014-09-24 ENCOUNTER — Encounter (HOSPITAL_COMMUNITY): Payer: Self-pay | Admitting: Orthopedic Surgery

## 2014-09-24 DIAGNOSIS — S72452D Displaced supracondylar fracture without intracondylar extension of lower end of left femur, subsequent encounter for closed fracture with routine healing: Secondary | ICD-10-CM | POA: Diagnosis not present

## 2014-09-26 ENCOUNTER — Encounter (HOSPITAL_COMMUNITY): Payer: Self-pay | Admitting: *Deleted

## 2014-09-26 ENCOUNTER — Inpatient Hospital Stay
Admission: RE | Admit: 2014-09-26 | Discharge: 2018-06-10 | Disposition: A | Discharge status: Nursing Home (Includes ICF) | Payer: Commercial Managed Care - HMO | Source: Ambulatory Visit | Attending: Internal Medicine | Admitting: Internal Medicine

## 2014-09-26 ENCOUNTER — Ambulatory Visit (HOSPITAL_COMMUNITY)
Admission: RE | Admit: 2014-09-26 | Discharge: 2014-09-26 | Disposition: A | Discharge status: Skilled Nursing Facility | Payer: Commercial Managed Care - HMO | Source: Ambulatory Visit | Attending: Gastroenterology | Admitting: Gastroenterology

## 2014-09-26 ENCOUNTER — Encounter (HOSPITAL_COMMUNITY)
Admission: RE | Disposition: A | Discharge status: Skilled Nursing Facility | Payer: Self-pay | Source: Ambulatory Visit | Attending: Gastroenterology

## 2014-09-26 DIAGNOSIS — K5730 Diverticulosis of large intestine without perforation or abscess without bleeding: Secondary | ICD-10-CM | POA: Diagnosis not present

## 2014-09-26 DIAGNOSIS — Z87891 Personal history of nicotine dependence: Secondary | ICD-10-CM | POA: Insufficient documentation

## 2014-09-26 DIAGNOSIS — I10 Essential (primary) hypertension: Secondary | ICD-10-CM | POA: Diagnosis not present

## 2014-09-26 DIAGNOSIS — M1990 Unspecified osteoarthritis, unspecified site: Secondary | ICD-10-CM | POA: Insufficient documentation

## 2014-09-26 DIAGNOSIS — K921 Melena: Secondary | ICD-10-CM | POA: Diagnosis not present

## 2014-09-26 DIAGNOSIS — D128 Benign neoplasm of rectum: Secondary | ICD-10-CM | POA: Insufficient documentation

## 2014-09-26 DIAGNOSIS — C182 Malignant neoplasm of ascending colon: Secondary | ICD-10-CM | POA: Diagnosis not present

## 2014-09-26 DIAGNOSIS — K922 Gastrointestinal hemorrhage, unspecified: Secondary | ICD-10-CM | POA: Diagnosis present

## 2014-09-26 DIAGNOSIS — K625 Hemorrhage of anus and rectum: Secondary | ICD-10-CM

## 2014-09-26 DIAGNOSIS — E43 Unspecified severe protein-calorie malnutrition: Secondary | ICD-10-CM | POA: Diagnosis present

## 2014-09-26 DIAGNOSIS — C189 Malignant neoplasm of colon, unspecified: Secondary | ICD-10-CM | POA: Insufficient documentation

## 2014-09-26 DIAGNOSIS — S72355D Nondisplaced comminuted fracture of shaft of left femur, subsequent encounter for closed fracture with routine healing: Secondary | ICD-10-CM | POA: Diagnosis present

## 2014-09-26 DIAGNOSIS — Z79899 Other long term (current) drug therapy: Secondary | ICD-10-CM | POA: Diagnosis not present

## 2014-09-26 DIAGNOSIS — K639 Disease of intestine, unspecified: Secondary | ICD-10-CM | POA: Diagnosis not present

## 2014-09-26 DIAGNOSIS — D500 Iron deficiency anemia secondary to blood loss (chronic): Secondary | ICD-10-CM | POA: Diagnosis present

## 2014-09-26 DIAGNOSIS — R109 Unspecified abdominal pain: Principal | ICD-10-CM

## 2014-09-26 DIAGNOSIS — D62 Acute posthemorrhagic anemia: Secondary | ICD-10-CM

## 2014-09-26 DIAGNOSIS — S72302A Unspecified fracture of shaft of left femur, initial encounter for closed fracture: Secondary | ICD-10-CM | POA: Diagnosis present

## 2014-09-26 HISTORY — PX: COLONOSCOPY: SHX5424

## 2014-09-26 LAB — COMPREHENSIVE METABOLIC PANEL
ALBUMIN: 3.2 g/dL — AB (ref 3.5–5.2)
ALK PHOS: 75 U/L (ref 39–117)
ALT: 24 U/L (ref 0–35)
AST: 27 U/L (ref 0–37)
Anion gap: 3 — ABNORMAL LOW (ref 5–15)
BUN: 20 mg/dL (ref 6–23)
CALCIUM: 8.8 mg/dL (ref 8.4–10.5)
CO2: 25 mmol/L (ref 19–32)
CREATININE: 1.62 mg/dL — AB (ref 0.50–1.10)
Chloride: 115 mmol/L — ABNORMAL HIGH (ref 96–112)
GFR calc non Af Amer: 27 mL/min — ABNORMAL LOW (ref >90)
GFR, EST AFRICAN AMERICAN: 31 mL/min — AB (ref >90)
Glucose, Bld: 98 mg/dL (ref 70–99)
Potassium: 4.1 mmol/L (ref 3.5–5.1)
Sodium: 143 mmol/L (ref 135–145)
Total Bilirubin: 0.5 mg/dL (ref 0.3–1.2)
Total Protein: 5.7 g/dL — ABNORMAL LOW (ref 6.0–8.3)

## 2014-09-26 LAB — CBC WITH DIFFERENTIAL/PLATELET
BASOS ABS: 0.1 10*3/uL (ref 0.0–0.1)
Basophils Relative: 1 % (ref 0–1)
Eosinophils Absolute: 0.3 10*3/uL (ref 0.0–0.7)
Eosinophils Relative: 4 % (ref 0–5)
HEMATOCRIT: 29 % — AB (ref 36.0–46.0)
HEMOGLOBIN: 9.4 g/dL — AB (ref 12.0–15.0)
LYMPHS PCT: 25 % (ref 12–46)
Lymphs Abs: 1.8 10*3/uL (ref 0.7–4.0)
MCH: 27.6 pg (ref 26.0–34.0)
MCHC: 32.4 g/dL (ref 30.0–36.0)
MCV: 85.3 fL (ref 78.0–100.0)
MONO ABS: 0.6 10*3/uL (ref 0.1–1.0)
Monocytes Relative: 9 % (ref 3–12)
Neutro Abs: 4.5 10*3/uL (ref 1.7–7.7)
Neutrophils Relative %: 61 % (ref 43–77)
Platelets: 319 10*3/uL (ref 150–400)
RBC: 3.4 MIL/uL — ABNORMAL LOW (ref 3.87–5.11)
RDW: 18.2 % — AB (ref 11.5–15.5)
WBC: 7.2 10*3/uL (ref 4.0–10.5)

## 2014-09-26 SURGERY — COLONOSCOPY
Anesthesia: Moderate Sedation

## 2014-09-26 MED ORDER — MIDAZOLAM HCL 5 MG/5ML IJ SOLN
INTRAMUSCULAR | Status: AC
  Filled 2014-09-26: qty 5

## 2014-09-26 MED ORDER — MIDAZOLAM HCL 5 MG/5ML IJ SOLN
INTRAMUSCULAR | Status: DC | PRN
  Administered 2014-09-26: 2 mg via INTRAVENOUS
  Administered 2014-09-26: 1 mg via INTRAVENOUS

## 2014-09-26 MED ORDER — MEPERIDINE HCL 50 MG/ML IJ SOLN
INTRAMUSCULAR | Status: AC
  Filled 2014-09-26: qty 1

## 2014-09-26 MED ORDER — STERILE WATER FOR IRRIGATION IR SOLN
Status: DC | PRN
  Administered 2014-09-26: 10:00:00

## 2014-09-26 MED ORDER — SPOT INK MARKER SYRINGE KIT
PACK | SUBMUCOSAL | Status: DC | PRN
  Administered 2014-09-26: 3 mL via SUBMUCOSAL

## 2014-09-26 MED ORDER — MEPERIDINE HCL 100 MG/ML IJ SOLN
INTRAMUSCULAR | Status: DC | PRN
  Administered 2014-09-26: 25 mg via INTRAVENOUS

## 2014-09-26 MED ORDER — SODIUM CHLORIDE 0.9 % IV SOLN
INTRAVENOUS | Status: DC
  Administered 2014-09-26: 10:00:00 via INTRAVENOUS

## 2014-09-26 NOTE — Progress Notes (Addendum)
Gave report to [employer] from [facility]. Bedside report. VSS. No questions from H.Bullins,RN. Discharge orders and summary written and given to [facility] by Dr. Barney Drain. No questions for Dr. Oneida Alar at this time.

## 2014-09-26 NOTE — H&P (Signed)
  Primary Care Physician:  [name], MD Primary Gastroenterologist:  Dr. Oneida Alar  Pre-Procedure History & Physical: HPI:  Alicia Guerra is a 81 y.o. female here for   Past Medical History  Diagnosis Date  . Hypertension   . Arthritis   . Anemia   . History of blood transfusion     "today is the first time" (08/05/2014)    Past Surgical History  Procedure Laterality Date  . Cataract extraction Left   . Tonsillectomy    . Appendectomy    . Cholecystectomy    . Cystectomy      "had cyst taken off lower back"  . Esophagogastroduodenoscopy N/A 08/22/2014    [id number] ring at the gastro junction/small HH  . Orif femur fracture Left 08/07/2014    Procedure: OPEN REDUCTION INTERNAL FIXATION (ORIF)  LEFT FEMUR FRACTURE;  Surgeon: [name], MD;  Location: [place];  Service: Orthopedics;  Laterality: Left;    Prior to Admission medications   Medication Sig Start Date End Date Taking? Authorizing Provider  amLODipine (NORVASC) 10 MG tablet Take 1 tablet (10 mg total) by mouth daily. 08/25/14  Yes Kathie Dike, MD  hydrALAZINE (APRESOLINE) 25 MG tablet Take 2 tablets (50 mg total) by mouth every 8 (eight) hours. 08/25/14  Yes Kathie Dike, MD  metoprolol (LOPRESSOR) 50 MG tablet Take 1 tablet (50 mg total) by mouth 2 (two) times daily. 08/25/14  Yes Kathie Dike, MD  mirtazapine (REMERON) 7.5 MG tablet Take 1 tablet (7.5 mg total) by mouth at bedtime. 08/15/14  Yes Hosie Poisson, MD  pantoprazole (PROTONIX) 40 MG tablet Take 1 tablet (40 mg total) by mouth daily before breakfast. 08/23/14  Yes Velvet Bathe, MD  polyethylene glycol-electrolytes (NULYTELY/GOLYTELY) 420 G solution Take 4,000 mLs by mouth once. 09/03/14  Yes Mahala Menghini, PA-C  feeding supplement, ENSURE COMPLETE, (ENSURE COMPLETE) LIQD Take 237 mLs by mouth 2 (two) times daily between meals. 08/15/14   Hosie Poisson, MD    Allergies as of 09/03/2014  . (No Known Allergies)    History reviewed. No  pertinent family history.  History   Social History  . Marital Status: Widowed    Spouse Name: N/A    Number of Children: N/A  . Years of Education: N/A   Occupational History  . Not on file.   Social History Main Topics  . Smoking status: Former Smoker -- 0.25 packs/day for 16 years    Types: Cigarettes  . Smokeless tobacco: Never Used  . Alcohol Use: No  . Drug Use: No  . Sexual Activity: No   Other Topics Concern  . Not on file   Social History Narrative    Review of Systems: See HPI, otherwise negative ROS   Physical Exam: BP 147/62 mmHg  Pulse 88  Temp(Src) 98.4 F (36.9 C) (Oral)  Wt 100 lb 12 oz (45.7 kg)  SpO2 91% General:   pleasant and cooperative in NAD Head:  Normocephalic and atraumatic. Neck:  Supple; Lungs:  Clear throughout to auscultation.    Heart:  Regular rate and rhythm. Abdomen:  Soft, nontender and nondistended. Normal bowel sounds, without guarding, and without rebound.   Neurologic:  Alert;  grossly normal neurologically.  Impression/Plan:    RECTAL BLEEDING PLAN:  1. TCS TODAY

## 2014-09-26 NOTE — OR Nursing (Signed)
Alicia Guerra [phone]

## 2014-09-26 NOTE — Op Note (Signed)
[place], [id number]   COLONOSCOPY PROCEDURE REPORT  PATIENT: Alicia Guerra, Alicia Guerra  MR#: [id number] BIRTHDATE: 11/22/1923 , 89  yrs. old GENDER: female ENDOSCOPIST: [name], MD REFERRED QP:[id number] [name], M.D. PROCEDURE DATE:  10-19-14 PROCEDURE:   Colonoscopy with cold biopsy polypectomy and Submucosal injection, any substance INDICATIONS:hematochezia. MEDICATIONS: Demerol 25 mg IV and Versed 3 mg IV  DESCRIPTION OF PROCEDURE:    Physical exam was performed.  Informed consent was obtained from the patient after explaining the benefits, risks, and alternatives to procedure.  The patient was connected to monitor and placed in left lateral position. Continuous oxygen was provided by nasal cannula and IV medicine administered through an indwelling cannula.  After administration of sedation and rectal exam, the patients rectum was intubated and the [id number]  colonoscope was advanced under direct visualization to the cecum.  The scope was removed slowly by carefully examining the color, texture, anatomy, and integrity mucosa on the way out.  The patient was recovered in endoscopy and discharged home in satisfactory condition.     COLON FINDINGS: A circumferential ulcerated mass was found in the ascending colon.  A polypectomy was performed with cold forceps and A sessile polyp measuring 6 mm in size was found at the hepatic flexure.  1 CC SPOT TATTOO DISTAL TO MASS AND POLYP IN RIGHT COLON. pan-colonic diverticula. LARGE 5-7 SEMIPEDUNCULATED POLYP IN THE RECTOM 9-15 CM FROM THE ANAL VERGE. 2 CC SPOT INJECTED AT PROXIMAL EDGE OF RECTALPOLYP.   NO INTERNAL HEMORRHOIDS  PREP QUALITY: good.  CECAL W/D TIME: 35       minutes COMPLICATIONS: None  ENDOSCOPIC IMPRESSION: 1.   Circumferential mass in the ascending colon, LIKELY DUE TO COLON ADENOCA 2.   ONE COLON POLYP at the hepatic flexure, NOT REMOVED 3.   LARGE RECTAL  POLYP, NOT REMOVED DUE TO RISK OF BLEEDING OR PERFORATION  RECOMMENDATIONS: AWAIT BIOPSY CMP/CBC/PT/INR/CEA TODAY PT NEEDS CT ABD/PELVIS & CXR. CONSIDER SURGERY REFERRAL PT WILL NEED EMR AT [facility] TO REMOVE RECTAL POLYP. DISCUSSED WITH NIECE: Alicia Guerra.  ____________________________ eSignedDanie Binder, MD 10-19-2014 2:34 PM    CPT CODES: ICD CODES:  The ICD and CPT codes recommended by this software are interpretations from the data that the clinical staff has captured with the software.  The verification of the translation of this report to the ICD and CPT codes and modifiers is the sole responsibility of the health care institution and practicing physician where this report was generated.  [employer] will not be held responsible for the validity of the ICD and CPT codes included on this report.  AMA assumes no liability for data contained or not contained herein. CPT is a Designer, television/film set of the [employer].

## 2014-09-26 NOTE — Progress Notes (Signed)
REVIEWED. AGREE. NO ADDITIONAL RECOMMENDATIONS. 

## 2014-09-28 LAB — CEA: CEA: 21.9 ng/mL — AB (ref 0.0–4.7)

## 2014-09-29 ENCOUNTER — Encounter: Payer: Self-pay | Admitting: Pulmonary Disease

## 2014-09-29 ENCOUNTER — Encounter (HOSPITAL_COMMUNITY): Payer: Self-pay | Admitting: Gastroenterology

## 2014-09-29 NOTE — H&P (Signed)
Alicia Guerra MRN: [id number] DOB/AGE: [birth year] 81 y.o. Primary Care Physician:Red Mandt L, MD Admit date: (Not on file) Chief Complaint: Fractured hip HPI: This is documentation of my history and physical performed on 08/31/2014 which I cannot now find in the record. This is an 81 year old who had a fractured femur. In addition to that she's been having some trouble with GI bleeding. She has been living alone. She is in the skilled care facility for rehabilitation. She has no new complaints and said that the bleeding that had been a problem seemed to have stopped.  Past Medical History  Diagnosis Date  . Hypertension   . Arthritis   . Anemia   . History of blood transfusion     "today is the first time" (08/05/2014)   Past Surgical History  Procedure Laterality Date  . Cataract extraction Left   . Tonsillectomy    . Appendectomy    . Cholecystectomy    . Cystectomy      "had cyst taken off lower back"  . Esophagogastroduodenoscopy N/A 08/22/2014    [id number] ring at the gastro junction/small HH  . Orif femur fracture Left 08/07/2014    Procedure: OPEN REDUCTION INTERNAL FIXATION (ORIF)  LEFT FEMUR FRACTURE;  Surgeon: [name], MD;  Location: [place];  Service: Orthopedics;  Laterality: Left;        No family history on file.  Social History:  reports that she has quit smoking. Her smoking use included Cigarettes. She has a 4 pack-year smoking history. She has never used smokeless tobacco. She reports that she does not drink alcohol or use illicit drugs.   Allergies: No Known Allergies   (Not in a hospital admission)     [id number] from the symptoms mentioned above,there are no other symptoms referable to all systems reviewed.  Physical Exam: There were no vitals taken for this visit. She is awake and alert. Her chest is clear. Her heart is regular. Her abdomen is soft. Her surgical site appears to be healing well. She has normal active bowel sounds.  HEENT shows that she has equal pupils she has multiple missing teeth ears are normal.    Recent Labs  09/26/14 1225  WBC 7.2  NEUTROABS 4.5  HGB 9.4*  HCT 29.0*  MCV 85.3  PLT 319    Recent Labs  09/26/14 1225  NA 143  K 4.1  CL 115*  CO2 25  GLUCOSE 98  BUN 20  CREATININE 1.62*  CALCIUM 8.8  lablast2(ast:2,ALT:2,alkphos:2,bilitot:2,prot:2,albumin:2)@    No results found for this or any previous visit (from the past 240 hour(s)).   Dg Chest 1 View  09/12/2014   CLINICAL DATA:  Positive PPD test.  EXAM: CHEST - 1 VIEW  COMPARISON:  August 06, 2014.  FINDINGS: Stable cardiomediastinal silhouette. No pneumothorax is noted. Stable apical pleural thickening is noted, with left greater than right. Stable small calcified granulomata in the left lung apex. Right perihilar opacity noted on prior exam appears to have resolved. However, mildly increased right basilar opacity is noted concerning for subsegmental atelectasis and associated small pleural effusion. Mild left basilar subsegmental atelectasis is noted as well.  IMPRESSION: Right perihilar opacity noted on prior exam has resolved.  Interval development of mild right basilar opacity is noted consistent with subsegmental listhesis with small associated pleural effusion. Mild left basilar subsegmental atelectasis is noted as well.   Electronically Signed   By: Alicia Guerra M.D.   On: 09/12/2014 09:08   Impression: She's had  a femur fracture. She has had some bleeding and the site of bleeding is not clear Active Problems:   * No active hospital problems. *     Plan: Continue current treatment she will have GI consultation. She will continue with her therapy etc.      Alicia Guerra   09/29/2014, 8:50 AM

## 2014-09-29 NOTE — Progress Notes (Signed)
This is a progress note on 09/29/2014. She had more episodes of GI bleeding and has been in and out of the hospital. She has recently had colonoscopy. She is doing better with her rehabilitation. If she has surgery it probably needs to be done at a tertiary care center so I need to discuss this with her family. This morning she is sleepy and not really interested in a conversation.

## 2014-10-09 ENCOUNTER — Telehealth: Payer: Self-pay | Admitting: Gastroenterology

## 2014-10-09 NOTE — Telephone Encounter (Signed)
Called NIECE TO DISCUSS RESULTS. EXPLAINED PT HAS COLON CANCRER AND A LARGE RECTAL POLYP. SPOKE WITH DR. Arnoldo Morale. AGREES RECTAL POLYP NEEDS TO BE REMOVED FIRST THEN PT NEEDS OPV TO DISCUSS R HEMICOLECTOMY.   PT NEEDS EMR AT [facility] FOR RECTAL POLYP. WILL DISCUSS WITH GI AT [facility] TO EXPEDITE EVALUATION/EMR. PT NEEDS OPV WITH DR, Arnoldo Morale FOLLOWING EMR.

## 2014-10-14 NOTE — Telephone Encounter (Signed)
Faxed labs, procedure and path reports to [facility]

## 2014-10-14 NOTE — Telephone Encounter (Addendum)
Called [facility] to arrange EMR. Awaiting return cal from DR. BRUGGEN [id number]. 1510-spoke with Dr. Liz Malady. HE WILL ARRANGE FOR EMR. PT NEEDS CT SCAN ABD/PEVIS/CXR THIS WEEK, DX; COLON CANCER STAGING THIS WEEK. IMAGES NEED TO BE SENT TO [place]. NEED TCS IMAGES SENT TO [facility].  FAX LABS, PATH REPORT AND TCS REPORT TO DR. Percell Belt [phone]. HE WILL EXPEDITE AN APPT DUE TO PT KNOWN HISTORY OF R COLON ADENOCA.   CALLED NIECE TO EXPLAINED SHE NEEDS EMR PRIOR TO SURGERY DUE TO NEED TO PLAN FOR SURGERY IF RECTAL POLYP CONTAINS CANCER GOT CUT OFF. LVM FOR HER TO CAL ME AT [phone] OR CALL MY CELL #.

## 2014-10-15 ENCOUNTER — Other Ambulatory Visit: Payer: Self-pay

## 2014-10-15 ENCOUNTER — Telehealth: Payer: Self-pay

## 2014-10-15 DIAGNOSIS — K621 Rectal polyp: Secondary | ICD-10-CM

## 2014-10-15 DIAGNOSIS — C189 Malignant neoplasm of colon, unspecified: Secondary | ICD-10-CM

## 2014-10-15 NOTE — Telephone Encounter (Signed)
Pre-cert for CT is 123456  Chest xray doesn't require precert

## 2014-10-15 NOTE — Telephone Encounter (Signed)
[facility] and notified nurse Asa Lente about CT Scan appt.   She is aware that pt has contrast that needs to be pick up.   Called Niece (pearlie) and LMOM.   CT is for 800am on 10/16/2014.

## 2014-10-15 NOTE — Telephone Encounter (Addendum)
NIECE CALLED MY CELL. CALLED HER BACK TO DISCUSS CONCERNS. CLARIFIED WHY PT NEEDS TO SEE DR. Morton Stall. CT FEB 11. OPV W/ DR. Morton Stall FEB 17. SHE SHOULD CALL WITH QUESTIONS OR CONCERNS.

## 2014-10-15 NOTE — Progress Notes (Signed)
Referral to general sugery has been made.  CT scan and CXR are scheduled for 10/16/2014 @ [facility].  Called and left niece Simmie Davies) a message on voicemail.

## 2014-10-15 NOTE — Progress Notes (Signed)
PLEASE CALL PT. CANCEL REFERRAL TO GASTROENTEROLOGY AND CHANGE REFERRAL TO GENERAL SURGERY DR. Marya Amsler WATERS FOR TRANSANAL EXCISION AND POSSIBLE R HEMICOLECTOMY.

## 2014-10-15 NOTE — Telephone Encounter (Signed)
[facility] and notified nurse Asa Lente about CT Scan appt. She is aware that pt has contrast that needs to be pick up.   Called Niece (pearlie) and LMOM.   CT is for 800am on 10/16/2014.

## 2014-10-16 ENCOUNTER — Inpatient Hospital Stay (HOSPITAL_COMMUNITY)
Admit: 2014-10-16 | Discharge: 2014-10-16 | Disposition: A | Discharge status: Home / Self Care | Payer: Commercial Managed Care - HMO | Attending: Gastroenterology | Admitting: Gastroenterology

## 2014-10-16 ENCOUNTER — Ambulatory Visit (HOSPITAL_COMMUNITY)
Admit: 2014-10-16 | Discharge: 2014-10-16 | Disposition: A | Discharge status: Home / Self Care | Payer: Commercial Managed Care - HMO | Source: Ambulatory Visit | Attending: Gastroenterology | Admitting: Gastroenterology

## 2014-10-16 DIAGNOSIS — Z9889 Other specified postprocedural states: Secondary | ICD-10-CM | POA: Insufficient documentation

## 2014-10-16 DIAGNOSIS — J90 Pleural effusion, not elsewhere classified: Secondary | ICD-10-CM | POA: Diagnosis not present

## 2014-10-16 DIAGNOSIS — K868 Other specified diseases of pancreas: Secondary | ICD-10-CM | POA: Diagnosis not present

## 2014-10-16 DIAGNOSIS — R938 Abnormal findings on diagnostic imaging of other specified body structures: Secondary | ICD-10-CM | POA: Insufficient documentation

## 2014-10-16 DIAGNOSIS — C182 Malignant neoplasm of ascending colon: Secondary | ICD-10-CM | POA: Insufficient documentation

## 2014-10-16 DIAGNOSIS — D128 Benign neoplasm of rectum: Secondary | ICD-10-CM | POA: Diagnosis not present

## 2014-10-16 DIAGNOSIS — I517 Cardiomegaly: Secondary | ICD-10-CM | POA: Diagnosis not present

## 2014-10-16 DIAGNOSIS — K621 Rectal polyp: Secondary | ICD-10-CM | POA: Diagnosis not present

## 2014-10-16 DIAGNOSIS — R933 Abnormal findings on diagnostic imaging of other parts of digestive tract: Secondary | ICD-10-CM | POA: Diagnosis not present

## 2014-10-16 DIAGNOSIS — L988 Other specified disorders of the skin and subcutaneous tissue: Secondary | ICD-10-CM | POA: Insufficient documentation

## 2014-10-16 MED ORDER — IOHEXOL 300 MG/ML  SOLN
80.0000 mL | Freq: Once | INTRAMUSCULAR | Status: AC | PRN
  Administered 2014-10-16: 80 mL via INTRAVENOUS

## 2014-10-16 NOTE — Telephone Encounter (Signed)
Called Pearlie at [phone] and she is gone for the afternoon. LM for her to call back.

## 2014-10-16 NOTE — Telephone Encounter (Signed)
PLEASE CALL PT'S NIECE. THE COLON HAS NOT SPREAD TO THE LIVER OR LUNGS.   FAX REPORT TO DR. WATER [place].

## 2014-10-17 NOTE — Telephone Encounter (Signed)
Alicia Guerra returned call and was informed.

## 2014-10-22 DIAGNOSIS — S72452D Displaced supracondylar fracture without intracondylar extension of lower end of left femur, subsequent encounter for closed fracture with routine healing: Secondary | ICD-10-CM | POA: Diagnosis not present

## 2014-10-22 NOTE — Telephone Encounter (Signed)
I faxed CT reports to Dr Sherwood Gambler [facility] general surgery

## 2014-10-23 DIAGNOSIS — C189 Malignant neoplasm of colon, unspecified: Secondary | ICD-10-CM | POA: Insufficient documentation

## 2014-10-23 DIAGNOSIS — K621 Rectal polyp: Secondary | ICD-10-CM | POA: Diagnosis not present

## 2014-11-03 ENCOUNTER — Encounter (HOSPITAL_COMMUNITY)
Admission: RE | Admit: 2014-11-03 | Discharge: 2014-11-03 | Disposition: A | Discharge status: Home / Self Care | Payer: Medicare HMO | Source: Ambulatory Visit | Attending: Internal Medicine | Admitting: Internal Medicine

## 2014-11-03 ENCOUNTER — Encounter (HOSPITAL_COMMUNITY): Payer: Medicare HMO | Attending: Pulmonary Disease

## 2014-11-03 LAB — CBC WITH DIFFERENTIAL/PLATELET
BASOS PCT: 1 % (ref 0–1)
Basophils Absolute: 0.1 10*3/uL (ref 0.0–0.1)
EOS ABS: 0.4 10*3/uL (ref 0.0–0.7)
Eosinophils Relative: 4 % (ref 0–5)
HEMATOCRIT: 22.5 % — AB (ref 36.0–46.0)
Hemoglobin: 6.9 g/dL — CL (ref 12.0–15.0)
Lymphocytes Relative: 21 % (ref 12–46)
Lymphs Abs: 1.8 10*3/uL (ref 0.7–4.0)
MCH: 26.1 pg (ref 26.0–34.0)
MCHC: 30.7 g/dL (ref 30.0–36.0)
MCV: 85.2 fL (ref 78.0–100.0)
MONOS PCT: 10 % (ref 3–12)
Monocytes Absolute: 0.9 10*3/uL (ref 0.1–1.0)
NEUTROS PCT: 63 % (ref 43–77)
Neutro Abs: 5.4 10*3/uL (ref 1.7–7.7)
Platelets: 292 10*3/uL (ref 150–400)
RBC: 2.64 MIL/uL — AB (ref 3.87–5.11)
RDW: 16.2 % — AB (ref 11.5–15.5)
WBC: 8.6 10*3/uL (ref 4.0–10.5)

## 2014-11-03 LAB — PREPARE RBC (CROSSMATCH)

## 2014-11-03 NOTE — Progress Notes (Signed)
Alicia Guerra, Alicia Guerra             ACCOUNT NO.:  [id number]  MEDICAL RECORD NO.:  [id number]  LOCATION:                                 FACILITY:  PHYSICIAN:  Viyan Rosamond L. Luan Pulling, M.D.DATE OF BIRTH:  1924-07-20  DATE OF PROCEDURE: DATE OF DISCHARGE:                                PROGRESS NOTE   I cannot get find a way do it through Epic.  Alicia Guerra says she feels okay.  She has no new complaints.  She is being scheduled to see the general surgeon at [facility].  She has not had any further bleeding noted.  PHYSICAL EXAMINATION:  GENERAL:  She is awake and alert.  She looks pretty comfortable. CHEST:  Clear. HEART:  Regular. ABDOMEN:  Soft without masses. EXTREMITIES:  No edema.  ASSESSMENT:  She seems to be doing better.  PLAN:  For potential surgery at [place]. Luan Pulling, M.D.     ELH/MEDQ  D:  11/02/2014  T:  11/03/2014  Job:  [id number]

## 2014-11-04 ENCOUNTER — Encounter (HOSPITAL_COMMUNITY)
Admission: RE | Admit: 2014-11-04 | Discharge: 2014-11-04 | Disposition: A | Discharge status: Home / Self Care | Payer: Commercial Managed Care - HMO | Source: Skilled Nursing Facility | Attending: Internal Medicine | Admitting: Internal Medicine

## 2014-11-04 ENCOUNTER — Non-Acute Institutional Stay: Payer: Self-pay | Admitting: *Deleted

## 2014-11-04 ENCOUNTER — Encounter (HOSPITAL_COMMUNITY)
Admission: AD | Admit: 2014-11-04 | Discharge: 2014-11-04 | Disposition: A | Discharge status: Home / Self Care | Payer: Medicare HMO | Source: Other Acute Inpatient Hospital | Attending: Pulmonary Disease | Admitting: Pulmonary Disease

## 2014-11-04 ENCOUNTER — Encounter (HOSPITAL_COMMUNITY): Payer: Medicare HMO

## 2014-11-04 LAB — CBC WITH DIFFERENTIAL/PLATELET
BASOS ABS: 0.1 10*3/uL (ref 0.0–0.1)
BASOS ABS: 0.1 10*3/uL (ref 0.0–0.1)
BASOS PCT: 1 % (ref 0–1)
Basophils Absolute: 0.1 10*3/uL (ref 0.0–0.1)
Basophils Relative: 1 % (ref 0–1)
Basophils Relative: 1 % (ref 0–1)
EOS ABS: 0.4 10*3/uL (ref 0.0–0.7)
EOS PCT: 5 % (ref 0–5)
Eosinophils Absolute: 0.4 10*3/uL (ref 0.0–0.7)
Eosinophils Absolute: 0.5 10*3/uL (ref 0.0–0.7)
Eosinophils Relative: 4 % (ref 0–5)
Eosinophils Relative: 4 % (ref 0–5)
HCT: 19.7 % — ABNORMAL LOW (ref 36.0–46.0)
HCT: 37.1 % (ref 36.0–46.0)
HEMATOCRIT: 37.5 % (ref 36.0–46.0)
Hemoglobin: 6.2 g/dL — CL (ref 12.0–15.0)
Hemoglobin: 12.2 g/dL (ref 12.0–15.0)
Hemoglobin: 12.2 g/dL (ref 12.0–15.0)
LYMPHS PCT: 22 % (ref 12–46)
Lymphocytes Relative: 28 % (ref 12–46)
Lymphocytes Relative: 24 % (ref 12–46)
Lymphs Abs: 2.2 10*3/uL (ref 0.7–4.0)
Lymphs Abs: 2.1 10*3/uL (ref 0.7–4.0)
Lymphs Abs: 2.5 10*3/uL (ref 0.7–4.0)
MCH: 26.7 pg (ref 26.0–34.0)
MCH: 27.6 pg (ref 26.0–34.0)
MCH: 27.8 pg (ref 26.0–34.0)
MCHC: 31.5 g/dL (ref 30.0–36.0)
MCHC: 32.5 g/dL (ref 30.0–36.0)
MCHC: 32.9 g/dL (ref 30.0–36.0)
MCV: 84.9 fL (ref 78.0–100.0)
MCV: 84.8 fL (ref 78.0–100.0)
MCV: 84.5 fL (ref 78.0–100.0)
MONO ABS: 1 10*3/uL (ref 0.1–1.0)
Monocytes Absolute: 0.7 10*3/uL (ref 0.1–1.0)
Monocytes Absolute: 1.2 10*3/uL — ABNORMAL HIGH (ref 0.1–1.0)
Monocytes Relative: 9 % (ref 3–12)
Monocytes Relative: 12 % (ref 3–12)
Monocytes Relative: 10 % (ref 3–12)
NEUTROS PCT: 61 % (ref 43–77)
Neutro Abs: 4.6 10*3/uL (ref 1.7–7.7)
Neutro Abs: 5.9 10*3/uL (ref 1.7–7.7)
Neutro Abs: 6.4 10*3/uL (ref 1.7–7.7)
Neutrophils Relative %: 57 % (ref 43–77)
Neutrophils Relative %: 62 % (ref 43–77)
PLATELETS: 353 10*3/uL (ref 150–400)
Platelets: 367 10*3/uL (ref 150–400)
Platelets: 354 10*3/uL (ref 150–400)
RBC: 2.32 MIL/uL — ABNORMAL LOW (ref 3.87–5.11)
RBC: 4.42 MIL/uL (ref 3.87–5.11)
RBC: 4.39 MIL/uL (ref 3.87–5.11)
RDW: 16.1 % — AB (ref 11.5–15.5)
RDW: 15.9 % — AB (ref 11.5–15.5)
RDW: 16 % — AB (ref 11.5–15.5)
WBC: 8 10*3/uL (ref 4.0–10.5)
WBC: 9.7 10*3/uL (ref 4.0–10.5)
WBC: 10.4 10*3/uL (ref 4.0–10.5)

## 2014-11-04 MED ORDER — SODIUM CHLORIDE 0.9 % IV SOLN: Freq: Once | INTRAVENOUS | Status: DC

## 2014-11-04 NOTE — Progress Notes (Signed)
VS remained stable throughtout blood transfusion.  1st unit was completed at 1010. Unit number was [id number]. No S&S of transfusion reaction.  2nd unit was started @ 1020.  Unit number was [id number].  VS remained stable throughout transfusion.  No S&S of transfusion reaction.  2nd unit was completed @ 1155.  Pt. Was escorted back to [facility] via [facility].

## 2014-11-05 LAB — TYPE AND SCREEN
ABO/RH(D): O POS
Antibody Screen: NEGATIVE
UNIT DIVISION: 0
Unit division: 0

## 2014-11-08 NOTE — Telephone Encounter (Signed)
NIECE CALLED. MS. GOOD MAN WOULD LIKE TO PROCEED WITH SURGERY. ASKED NIECE TO CALL DR. WATERS [facility] TO SCHEDULE. SHE SHOULD ASK HIM IF HE WILL DO HER PROCEDURE IN 2 STEPS OR ONE.

## 2014-11-09 DIAGNOSIS — S82899A Other fracture of unspecified lower leg, initial encounter for closed fracture: Secondary | ICD-10-CM | POA: Diagnosis not present

## 2014-11-09 DIAGNOSIS — D500 Iron deficiency anemia secondary to blood loss (chronic): Secondary | ICD-10-CM | POA: Diagnosis not present

## 2014-11-10 NOTE — Progress Notes (Signed)
Subjective: This is documentation of my visit of 11/09/2014. Since my last visit she's had to have 2 more units of packed red blood cells. She has not noticed any bleeding. She has no other new complaints. She still has some pain in her hip. She is scheduled to see the surgeon at [facility] within the next 10 days.  Objective: Vital signs in last 24 hours:   Weight change:     Intake/Output from previous day:    PHYSICAL EXAM General appearance: alert, cooperative and no distress Resp: clear to auscultation bilaterally Cardio: regular rate and rhythm, S1, S2 normal, no murmur, click, rub or gallop GI: soft, non-tender; bowel sounds normal; no masses,  no organomegaly Extremities: extremities normal, atraumatic, no cyanosis or edema  Lab Results:  No results found for this or any previous visit (from the past 48 hour(s)).  ABGS No results for input(s): PHART, PO2ART, TCO2, HCO3 in the last 72 hours.  Invalid input(s): PCO2 CULTURES No results found for this or any previous visit (from the past 240 hour(s)). Studies/Results: No results found.  Medications:  Prior to Admission:  Prescriptions prior to admission  Medication Sig Dispense Refill Last Dose  . amLODipine (NORVASC) 10 MG tablet Take 1 tablet (10 mg total) by mouth daily.   09/25/2014 at Unknown time  . feeding supplement, ENSURE COMPLETE, (ENSURE COMPLETE) LIQD Take 237 mLs by mouth 2 (two) times daily between meals.   Unknown at Unknown time  . hydrALAZINE (APRESOLINE) 25 MG tablet Take 2 tablets (50 mg total) by mouth every 8 (eight) hours.   09/25/2014 at Unknown time  . metoprolol (LOPRESSOR) 50 MG tablet Take 1 tablet (50 mg total) by mouth 2 (two) times daily.   09/25/2014 at Unknown time  . mirtazapine (REMERON) 7.5 MG tablet Take 1 tablet (7.5 mg total) by mouth at bedtime.   09/25/2014 at Unknown time  . pantoprazole (PROTONIX) 40 MG tablet Take 1 tablet (40 mg total) by mouth daily before breakfast. 30 tablet 0  09/25/2014 at Unknown time   Scheduled: Continuous: PRN:  Assesment: She had a fracture in her femur. As part of the workup of that she was found to be markedly anemic and she has what appears to be a colon cancer. This is near obstructing so she is being set up for surgery potentially at [facility]. She's required multiple units of blood. She says she feels okay. Active Problems:   * No active hospital problems. *    Plan: She will have evaluation by the surgeon at [facility] and potential surgery after that    LOS: 45 days   Bonetta Mostek L 11/10/2014, 7:46 AM

## 2014-11-18 DIAGNOSIS — K589 Irritable bowel syndrome without diarrhea: Secondary | ICD-10-CM | POA: Diagnosis not present

## 2014-11-18 DIAGNOSIS — Z01810 Encounter for preprocedural cardiovascular examination: Secondary | ICD-10-CM | POA: Diagnosis not present

## 2014-11-18 DIAGNOSIS — K219 Gastro-esophageal reflux disease without esophagitis: Secondary | ICD-10-CM | POA: Diagnosis not present

## 2014-11-18 DIAGNOSIS — I517 Cardiomegaly: Secondary | ICD-10-CM | POA: Diagnosis not present

## 2014-11-18 DIAGNOSIS — I129 Hypertensive chronic kidney disease with stage 1 through stage 4 chronic kidney disease, or unspecified chronic kidney disease: Secondary | ICD-10-CM | POA: Diagnosis not present

## 2014-11-18 DIAGNOSIS — Z01818 Encounter for other preprocedural examination: Secondary | ICD-10-CM | POA: Diagnosis not present

## 2014-11-18 DIAGNOSIS — K621 Rectal polyp: Secondary | ICD-10-CM | POA: Diagnosis not present

## 2014-11-18 DIAGNOSIS — Z87891 Personal history of nicotine dependence: Secondary | ICD-10-CM | POA: Diagnosis not present

## 2014-11-18 DIAGNOSIS — I10 Essential (primary) hypertension: Secondary | ICD-10-CM | POA: Diagnosis not present

## 2014-11-18 DIAGNOSIS — D649 Anemia, unspecified: Secondary | ICD-10-CM | POA: Diagnosis not present

## 2014-11-18 DIAGNOSIS — C182 Malignant neoplasm of ascending colon: Secondary | ICD-10-CM | POA: Diagnosis not present

## 2014-11-18 DIAGNOSIS — N183 Chronic kidney disease, stage 3 (moderate): Secondary | ICD-10-CM | POA: Diagnosis not present

## 2014-11-18 DIAGNOSIS — C189 Malignant neoplasm of colon, unspecified: Secondary | ICD-10-CM | POA: Diagnosis not present

## 2014-11-19 DIAGNOSIS — S72452D Displaced supracondylar fracture without intracondylar extension of lower end of left femur, subsequent encounter for closed fracture with routine healing: Secondary | ICD-10-CM | POA: Diagnosis not present

## 2014-12-21 DIAGNOSIS — I10 Essential (primary) hypertension: Secondary | ICD-10-CM | POA: Diagnosis not present

## 2014-12-21 DIAGNOSIS — K219 Gastro-esophageal reflux disease without esophagitis: Secondary | ICD-10-CM | POA: Diagnosis not present

## 2014-12-21 DIAGNOSIS — N189 Chronic kidney disease, unspecified: Secondary | ICD-10-CM | POA: Diagnosis not present

## 2014-12-21 DIAGNOSIS — Z01810 Encounter for preprocedural cardiovascular examination: Secondary | ICD-10-CM | POA: Diagnosis not present

## 2014-12-21 DIAGNOSIS — Z7409 Other reduced mobility: Secondary | ICD-10-CM | POA: Diagnosis not present

## 2014-12-21 DIAGNOSIS — C19 Malignant neoplasm of rectosigmoid junction: Secondary | ICD-10-CM | POA: Diagnosis not present

## 2014-12-21 DIAGNOSIS — G893 Neoplasm related pain (acute) (chronic): Secondary | ICD-10-CM | POA: Diagnosis not present

## 2014-12-21 DIAGNOSIS — K589 Irritable bowel syndrome without diarrhea: Secondary | ICD-10-CM | POA: Diagnosis not present

## 2014-12-21 DIAGNOSIS — I499 Cardiac arrhythmia, unspecified: Secondary | ICD-10-CM | POA: Diagnosis not present

## 2014-12-21 DIAGNOSIS — C20 Malignant neoplasm of rectum: Secondary | ICD-10-CM | POA: Diagnosis not present

## 2014-12-21 DIAGNOSIS — R531 Weakness: Secondary | ICD-10-CM | POA: Diagnosis not present

## 2014-12-21 DIAGNOSIS — C182 Malignant neoplasm of ascending colon: Secondary | ICD-10-CM | POA: Diagnosis not present

## 2014-12-21 DIAGNOSIS — C189 Malignant neoplasm of colon, unspecified: Secondary | ICD-10-CM | POA: Diagnosis not present

## 2014-12-21 DIAGNOSIS — I129 Hypertensive chronic kidney disease with stage 1 through stage 4 chronic kidney disease, or unspecified chronic kidney disease: Secondary | ICD-10-CM | POA: Diagnosis not present

## 2014-12-21 DIAGNOSIS — R5383 Other fatigue: Secondary | ICD-10-CM | POA: Diagnosis not present

## 2014-12-21 DIAGNOSIS — D62 Acute posthemorrhagic anemia: Secondary | ICD-10-CM | POA: Diagnosis not present

## 2014-12-21 DIAGNOSIS — K621 Rectal polyp: Secondary | ICD-10-CM | POA: Diagnosis not present

## 2015-01-08 DIAGNOSIS — R1312 Dysphagia, oropharyngeal phase: Secondary | ICD-10-CM | POA: Diagnosis not present

## 2015-01-08 DIAGNOSIS — Z432 Encounter for attention to ileostomy: Secondary | ICD-10-CM | POA: Diagnosis not present

## 2015-01-08 DIAGNOSIS — M6281 Muscle weakness (generalized): Secondary | ICD-10-CM | POA: Diagnosis not present

## 2015-01-08 DIAGNOSIS — R278 Other lack of coordination: Secondary | ICD-10-CM | POA: Diagnosis not present

## 2015-01-08 DIAGNOSIS — K922 Gastrointestinal hemorrhage, unspecified: Secondary | ICD-10-CM | POA: Diagnosis not present

## 2015-01-08 DIAGNOSIS — Z9181 History of falling: Secondary | ICD-10-CM | POA: Diagnosis not present

## 2015-01-09 DIAGNOSIS — R1312 Dysphagia, oropharyngeal phase: Secondary | ICD-10-CM | POA: Diagnosis not present

## 2015-01-09 DIAGNOSIS — M6281 Muscle weakness (generalized): Secondary | ICD-10-CM | POA: Diagnosis not present

## 2015-01-09 DIAGNOSIS — K922 Gastrointestinal hemorrhage, unspecified: Secondary | ICD-10-CM | POA: Diagnosis not present

## 2015-01-09 DIAGNOSIS — Z9181 History of falling: Secondary | ICD-10-CM | POA: Diagnosis not present

## 2015-01-09 DIAGNOSIS — R278 Other lack of coordination: Secondary | ICD-10-CM | POA: Diagnosis not present

## 2015-01-09 DIAGNOSIS — Z432 Encounter for attention to ileostomy: Secondary | ICD-10-CM | POA: Diagnosis not present

## 2015-01-11 DIAGNOSIS — C180 Malignant neoplasm of cecum: Secondary | ICD-10-CM | POA: Diagnosis not present

## 2015-01-11 DIAGNOSIS — D500 Iron deficiency anemia secondary to blood loss (chronic): Secondary | ICD-10-CM | POA: Diagnosis not present

## 2015-01-12 DIAGNOSIS — M6281 Muscle weakness (generalized): Secondary | ICD-10-CM | POA: Diagnosis not present

## 2015-01-12 DIAGNOSIS — R1312 Dysphagia, oropharyngeal phase: Secondary | ICD-10-CM | POA: Diagnosis not present

## 2015-01-12 DIAGNOSIS — Z9181 History of falling: Secondary | ICD-10-CM | POA: Diagnosis not present

## 2015-01-12 DIAGNOSIS — R278 Other lack of coordination: Secondary | ICD-10-CM | POA: Diagnosis not present

## 2015-01-12 DIAGNOSIS — Z432 Encounter for attention to ileostomy: Secondary | ICD-10-CM | POA: Diagnosis not present

## 2015-01-12 DIAGNOSIS — K922 Gastrointestinal hemorrhage, unspecified: Secondary | ICD-10-CM | POA: Diagnosis not present

## 2015-01-12 NOTE — H&P (Signed)
Alicia Guerra MRN: [id number] DOB/AGE: [birth year] 81 y.o. Primary Care Physician:Alicia Farias L, MD Admit date: 09/26/2014 Chief Complaint: Colon cancer HPI: This is documentation of my history and physical from the eighth. She has been in the hospital at [facility] for surgery for colon cancer. She has a colostomy which I believe is going to be permanent but I do not have records from [place] yet. She says she feels okay. According to the nursing staff she does tend to get somewhat agitated and has been fidgeting with her colostomy. No other new complaints have been noted.  Past Medical History  Diagnosis Date  . Hypertension   . Arthritis   . Anemia   . History of blood transfusion     "today is the first time" (08/05/2014)   Past Surgical History  Procedure Laterality Date  . Cataract extraction Left   . Tonsillectomy    . Appendectomy    . Cholecystectomy    . Cystectomy      "had cyst taken off lower back"  . Esophagogastroduodenoscopy N/A 08/22/2014    [id number] ring at the gastro junction/small HH  . Orif femur fracture Left 08/07/2014    Procedure: OPEN REDUCTION INTERNAL FIXATION (ORIF)  LEFT FEMUR FRACTURE;  Surgeon: [name], MD;  Location: [place];  Service: Orthopedics;  Laterality: Left;  . Colonoscopy N/A 09/26/2014    Procedure: COLONOSCOPY;  Surgeon: [name], MD;  [facility];  Service: Endoscopy;  Laterality: N/A;  1030am        History reviewed. No pertinent family history.  Social History:  reports that she has quit smoking. Her smoking use included Cigarettes. She has a 4 pack-year smoking history. She has never used smokeless tobacco. She reports that she does not drink alcohol or use illicit drugs.   Allergies: No Known Allergies  Medications Prior to Admission  Medication Sig Dispense Refill  . amLODipine (NORVASC) 10 MG tablet Take 1 tablet (10 mg total) by mouth daily.    . feeding supplement, ENSURE  COMPLETE, (ENSURE COMPLETE) LIQD Take 237 mLs by mouth 2 (two) times daily between meals.    . hydrALAZINE (APRESOLINE) 25 MG tablet Take 2 tablets (50 mg total) by mouth every 8 (eight) hours.    . metoprolol (LOPRESSOR) 50 MG tablet Take 1 tablet (50 mg total) by mouth 2 (two) times daily.    . mirtazapine (REMERON) 7.5 MG tablet Take 1 tablet (7.5 mg total) by mouth at bedtime.    . pantoprazole (PROTONIX) 40 MG tablet Take 1 tablet (40 mg total) by mouth daily before breakfast. 30 tablet 0       [id number] from the symptoms mentioned above,there are no other symptoms referable to all systems reviewed.  Physical Exam: Blood pressure 146/87, pulse 71, temperature 97.7 F (36.5 C), temperature source Oral, resp. rate 16, SpO2 100 %. She is awake and alert. She is very thin. Her chest is clear. HEENT generally unremarkable. Her heart is regular. Abdomen is soft she does have a colostomy bag in place. Extremities showed no edema. Central nervous system exam shows that she is mildly confused but otherwise intact   No results for input(s): WBC, NEUTROABS, HGB, HCT, MCV, PLT in the last 72 hours. No results for input(s): NA, K, CL, CO2, GLUCOSE, BUN, CREATININE, CALCIUM, MG in the last 72 hours.  Invalid input(s): PHOlablast2(ast:2,ALT:2,alkphos:2,bilitot:2,prot:2,albumin:2)@    No results found for this or any previous visit (from the past 240 hour(s)).  No results found. Impression: She has colon cancer and has had surgery and has a colostomy. She had been having trouble with rectal bleeding. She has protein calorie malnutrition and this should improve. All of this started with a left femoral shaft fracture which is improving Active Problems:   Blood loss anemia   Protein-calorie malnutrition, severe   Left femoral shaft fracture   GI bleed   Essential hypertension   Rectal bleeding     Plan: Continue current treatment      Alicia Guerra   01/12/2015, 8:57 AM

## 2015-01-13 DIAGNOSIS — Z432 Encounter for attention to ileostomy: Secondary | ICD-10-CM | POA: Diagnosis not present

## 2015-01-13 DIAGNOSIS — R1312 Dysphagia, oropharyngeal phase: Secondary | ICD-10-CM | POA: Diagnosis not present

## 2015-01-13 DIAGNOSIS — M6281 Muscle weakness (generalized): Secondary | ICD-10-CM | POA: Diagnosis not present

## 2015-01-13 DIAGNOSIS — K922 Gastrointestinal hemorrhage, unspecified: Secondary | ICD-10-CM | POA: Diagnosis not present

## 2015-01-13 DIAGNOSIS — Z9181 History of falling: Secondary | ICD-10-CM | POA: Diagnosis not present

## 2015-01-13 DIAGNOSIS — R278 Other lack of coordination: Secondary | ICD-10-CM | POA: Diagnosis not present

## 2015-01-14 DIAGNOSIS — K922 Gastrointestinal hemorrhage, unspecified: Secondary | ICD-10-CM | POA: Diagnosis not present

## 2015-01-14 DIAGNOSIS — R1312 Dysphagia, oropharyngeal phase: Secondary | ICD-10-CM | POA: Diagnosis not present

## 2015-01-14 DIAGNOSIS — M6281 Muscle weakness (generalized): Secondary | ICD-10-CM | POA: Diagnosis not present

## 2015-01-14 DIAGNOSIS — Z9181 History of falling: Secondary | ICD-10-CM | POA: Diagnosis not present

## 2015-01-14 DIAGNOSIS — Z432 Encounter for attention to ileostomy: Secondary | ICD-10-CM | POA: Diagnosis not present

## 2015-01-14 DIAGNOSIS — R278 Other lack of coordination: Secondary | ICD-10-CM | POA: Diagnosis not present

## 2015-01-15 DIAGNOSIS — Z432 Encounter for attention to ileostomy: Secondary | ICD-10-CM | POA: Diagnosis not present

## 2015-01-15 DIAGNOSIS — K922 Gastrointestinal hemorrhage, unspecified: Secondary | ICD-10-CM | POA: Diagnosis not present

## 2015-01-15 DIAGNOSIS — R1312 Dysphagia, oropharyngeal phase: Secondary | ICD-10-CM | POA: Diagnosis not present

## 2015-01-15 DIAGNOSIS — R278 Other lack of coordination: Secondary | ICD-10-CM | POA: Diagnosis not present

## 2015-01-15 DIAGNOSIS — M6281 Muscle weakness (generalized): Secondary | ICD-10-CM | POA: Diagnosis not present

## 2015-01-15 DIAGNOSIS — Z9181 History of falling: Secondary | ICD-10-CM | POA: Diagnosis not present

## 2015-01-16 DIAGNOSIS — M6281 Muscle weakness (generalized): Secondary | ICD-10-CM | POA: Diagnosis not present

## 2015-01-16 DIAGNOSIS — K922 Gastrointestinal hemorrhage, unspecified: Secondary | ICD-10-CM | POA: Diagnosis not present

## 2015-01-16 DIAGNOSIS — Z432 Encounter for attention to ileostomy: Secondary | ICD-10-CM | POA: Diagnosis not present

## 2015-01-16 DIAGNOSIS — Z9181 History of falling: Secondary | ICD-10-CM | POA: Diagnosis not present

## 2015-01-16 DIAGNOSIS — R1312 Dysphagia, oropharyngeal phase: Secondary | ICD-10-CM | POA: Diagnosis not present

## 2015-01-16 DIAGNOSIS — R278 Other lack of coordination: Secondary | ICD-10-CM | POA: Diagnosis not present

## 2015-01-19 DIAGNOSIS — R278 Other lack of coordination: Secondary | ICD-10-CM | POA: Diagnosis not present

## 2015-01-19 DIAGNOSIS — M6281 Muscle weakness (generalized): Secondary | ICD-10-CM | POA: Diagnosis not present

## 2015-01-19 DIAGNOSIS — K922 Gastrointestinal hemorrhage, unspecified: Secondary | ICD-10-CM | POA: Diagnosis not present

## 2015-01-19 DIAGNOSIS — Z9181 History of falling: Secondary | ICD-10-CM | POA: Diagnosis not present

## 2015-01-19 DIAGNOSIS — Z432 Encounter for attention to ileostomy: Secondary | ICD-10-CM | POA: Diagnosis not present

## 2015-01-19 DIAGNOSIS — R1312 Dysphagia, oropharyngeal phase: Secondary | ICD-10-CM | POA: Diagnosis not present

## 2015-01-20 DIAGNOSIS — M6281 Muscle weakness (generalized): Secondary | ICD-10-CM | POA: Diagnosis not present

## 2015-01-20 DIAGNOSIS — Z9181 History of falling: Secondary | ICD-10-CM | POA: Diagnosis not present

## 2015-01-20 DIAGNOSIS — Z432 Encounter for attention to ileostomy: Secondary | ICD-10-CM | POA: Diagnosis not present

## 2015-01-20 DIAGNOSIS — R1312 Dysphagia, oropharyngeal phase: Secondary | ICD-10-CM | POA: Diagnosis not present

## 2015-01-20 DIAGNOSIS — R278 Other lack of coordination: Secondary | ICD-10-CM | POA: Diagnosis not present

## 2015-01-20 DIAGNOSIS — K922 Gastrointestinal hemorrhage, unspecified: Secondary | ICD-10-CM | POA: Diagnosis not present

## 2015-01-21 DIAGNOSIS — K922 Gastrointestinal hemorrhage, unspecified: Secondary | ICD-10-CM | POA: Diagnosis not present

## 2015-01-21 DIAGNOSIS — Z9181 History of falling: Secondary | ICD-10-CM | POA: Diagnosis not present

## 2015-01-21 DIAGNOSIS — R278 Other lack of coordination: Secondary | ICD-10-CM | POA: Diagnosis not present

## 2015-01-21 DIAGNOSIS — Z432 Encounter for attention to ileostomy: Secondary | ICD-10-CM | POA: Diagnosis not present

## 2015-01-21 DIAGNOSIS — R1312 Dysphagia, oropharyngeal phase: Secondary | ICD-10-CM | POA: Diagnosis not present

## 2015-01-21 DIAGNOSIS — M6281 Muscle weakness (generalized): Secondary | ICD-10-CM | POA: Diagnosis not present

## 2015-01-22 DIAGNOSIS — R1312 Dysphagia, oropharyngeal phase: Secondary | ICD-10-CM | POA: Diagnosis not present

## 2015-01-22 DIAGNOSIS — R278 Other lack of coordination: Secondary | ICD-10-CM | POA: Diagnosis not present

## 2015-01-22 DIAGNOSIS — Z9181 History of falling: Secondary | ICD-10-CM | POA: Diagnosis not present

## 2015-01-22 DIAGNOSIS — M6281 Muscle weakness (generalized): Secondary | ICD-10-CM | POA: Diagnosis not present

## 2015-01-22 DIAGNOSIS — Z432 Encounter for attention to ileostomy: Secondary | ICD-10-CM | POA: Diagnosis not present

## 2015-01-22 DIAGNOSIS — K922 Gastrointestinal hemorrhage, unspecified: Secondary | ICD-10-CM | POA: Diagnosis not present

## 2015-01-23 DIAGNOSIS — Z9889 Other specified postprocedural states: Secondary | ICD-10-CM | POA: Diagnosis not present

## 2015-01-23 DIAGNOSIS — C20 Malignant neoplasm of rectum: Secondary | ICD-10-CM | POA: Insufficient documentation

## 2015-01-23 DIAGNOSIS — R278 Other lack of coordination: Secondary | ICD-10-CM | POA: Diagnosis not present

## 2015-01-23 DIAGNOSIS — Z4802 Encounter for removal of sutures: Secondary | ICD-10-CM | POA: Diagnosis not present

## 2015-01-23 DIAGNOSIS — I129 Hypertensive chronic kidney disease with stage 1 through stage 4 chronic kidney disease, or unspecified chronic kidney disease: Secondary | ICD-10-CM | POA: Diagnosis not present

## 2015-01-23 DIAGNOSIS — C19 Malignant neoplasm of rectosigmoid junction: Secondary | ICD-10-CM | POA: Diagnosis not present

## 2015-01-23 DIAGNOSIS — Z9049 Acquired absence of other specified parts of digestive tract: Secondary | ICD-10-CM | POA: Diagnosis not present

## 2015-01-23 DIAGNOSIS — N189 Chronic kidney disease, unspecified: Secondary | ICD-10-CM | POA: Diagnosis not present

## 2015-01-23 DIAGNOSIS — Z432 Encounter for attention to ileostomy: Secondary | ICD-10-CM | POA: Diagnosis not present

## 2015-01-23 DIAGNOSIS — M6281 Muscle weakness (generalized): Secondary | ICD-10-CM | POA: Diagnosis not present

## 2015-01-23 DIAGNOSIS — Z9181 History of falling: Secondary | ICD-10-CM | POA: Diagnosis not present

## 2015-01-23 DIAGNOSIS — K219 Gastro-esophageal reflux disease without esophagitis: Secondary | ICD-10-CM | POA: Diagnosis not present

## 2015-01-23 DIAGNOSIS — K922 Gastrointestinal hemorrhage, unspecified: Secondary | ICD-10-CM | POA: Diagnosis not present

## 2015-01-23 DIAGNOSIS — R1312 Dysphagia, oropharyngeal phase: Secondary | ICD-10-CM | POA: Diagnosis not present

## 2015-01-26 DIAGNOSIS — R278 Other lack of coordination: Secondary | ICD-10-CM | POA: Diagnosis not present

## 2015-01-26 DIAGNOSIS — Z432 Encounter for attention to ileostomy: Secondary | ICD-10-CM | POA: Diagnosis not present

## 2015-01-26 DIAGNOSIS — R1312 Dysphagia, oropharyngeal phase: Secondary | ICD-10-CM | POA: Diagnosis not present

## 2015-01-26 DIAGNOSIS — K922 Gastrointestinal hemorrhage, unspecified: Secondary | ICD-10-CM | POA: Diagnosis not present

## 2015-01-26 DIAGNOSIS — Z9181 History of falling: Secondary | ICD-10-CM | POA: Diagnosis not present

## 2015-01-26 DIAGNOSIS — M6281 Muscle weakness (generalized): Secondary | ICD-10-CM | POA: Diagnosis not present

## 2015-01-27 DIAGNOSIS — Z9181 History of falling: Secondary | ICD-10-CM | POA: Diagnosis not present

## 2015-01-27 DIAGNOSIS — M6281 Muscle weakness (generalized): Secondary | ICD-10-CM | POA: Diagnosis not present

## 2015-01-27 DIAGNOSIS — Z432 Encounter for attention to ileostomy: Secondary | ICD-10-CM | POA: Diagnosis not present

## 2015-01-27 DIAGNOSIS — K922 Gastrointestinal hemorrhage, unspecified: Secondary | ICD-10-CM | POA: Diagnosis not present

## 2015-01-27 DIAGNOSIS — R1312 Dysphagia, oropharyngeal phase: Secondary | ICD-10-CM | POA: Diagnosis not present

## 2015-01-27 DIAGNOSIS — R278 Other lack of coordination: Secondary | ICD-10-CM | POA: Diagnosis not present

## 2015-01-28 DIAGNOSIS — Z432 Encounter for attention to ileostomy: Secondary | ICD-10-CM | POA: Diagnosis not present

## 2015-01-28 DIAGNOSIS — Z9181 History of falling: Secondary | ICD-10-CM | POA: Diagnosis not present

## 2015-01-28 DIAGNOSIS — R278 Other lack of coordination: Secondary | ICD-10-CM | POA: Diagnosis not present

## 2015-01-28 DIAGNOSIS — K922 Gastrointestinal hemorrhage, unspecified: Secondary | ICD-10-CM | POA: Diagnosis not present

## 2015-01-28 DIAGNOSIS — R1312 Dysphagia, oropharyngeal phase: Secondary | ICD-10-CM | POA: Diagnosis not present

## 2015-01-28 DIAGNOSIS — M6281 Muscle weakness (generalized): Secondary | ICD-10-CM | POA: Diagnosis not present

## 2015-01-29 DIAGNOSIS — R1312 Dysphagia, oropharyngeal phase: Secondary | ICD-10-CM | POA: Diagnosis not present

## 2015-01-29 DIAGNOSIS — K922 Gastrointestinal hemorrhage, unspecified: Secondary | ICD-10-CM | POA: Diagnosis not present

## 2015-01-29 DIAGNOSIS — M6281 Muscle weakness (generalized): Secondary | ICD-10-CM | POA: Diagnosis not present

## 2015-01-29 DIAGNOSIS — Z9181 History of falling: Secondary | ICD-10-CM | POA: Diagnosis not present

## 2015-01-29 DIAGNOSIS — R278 Other lack of coordination: Secondary | ICD-10-CM | POA: Diagnosis not present

## 2015-01-29 DIAGNOSIS — Z432 Encounter for attention to ileostomy: Secondary | ICD-10-CM | POA: Diagnosis not present

## 2015-01-30 DIAGNOSIS — Z432 Encounter for attention to ileostomy: Secondary | ICD-10-CM | POA: Diagnosis not present

## 2015-01-30 DIAGNOSIS — Z9181 History of falling: Secondary | ICD-10-CM | POA: Diagnosis not present

## 2015-01-30 DIAGNOSIS — R1312 Dysphagia, oropharyngeal phase: Secondary | ICD-10-CM | POA: Diagnosis not present

## 2015-01-30 DIAGNOSIS — M6281 Muscle weakness (generalized): Secondary | ICD-10-CM | POA: Diagnosis not present

## 2015-01-30 DIAGNOSIS — R278 Other lack of coordination: Secondary | ICD-10-CM | POA: Diagnosis not present

## 2015-01-30 DIAGNOSIS — K922 Gastrointestinal hemorrhage, unspecified: Secondary | ICD-10-CM | POA: Diagnosis not present

## 2015-02-01 DIAGNOSIS — R1312 Dysphagia, oropharyngeal phase: Secondary | ICD-10-CM | POA: Diagnosis not present

## 2015-02-01 DIAGNOSIS — Z432 Encounter for attention to ileostomy: Secondary | ICD-10-CM | POA: Diagnosis not present

## 2015-02-01 DIAGNOSIS — M6281 Muscle weakness (generalized): Secondary | ICD-10-CM | POA: Diagnosis not present

## 2015-02-01 DIAGNOSIS — Z9181 History of falling: Secondary | ICD-10-CM | POA: Diagnosis not present

## 2015-02-01 DIAGNOSIS — R278 Other lack of coordination: Secondary | ICD-10-CM | POA: Diagnosis not present

## 2015-02-01 DIAGNOSIS — K922 Gastrointestinal hemorrhage, unspecified: Secondary | ICD-10-CM | POA: Diagnosis not present

## 2015-02-02 DIAGNOSIS — Z432 Encounter for attention to ileostomy: Secondary | ICD-10-CM | POA: Diagnosis not present

## 2015-02-02 DIAGNOSIS — Z9181 History of falling: Secondary | ICD-10-CM | POA: Diagnosis not present

## 2015-02-02 DIAGNOSIS — R278 Other lack of coordination: Secondary | ICD-10-CM | POA: Diagnosis not present

## 2015-02-02 DIAGNOSIS — R1312 Dysphagia, oropharyngeal phase: Secondary | ICD-10-CM | POA: Diagnosis not present

## 2015-02-02 DIAGNOSIS — K922 Gastrointestinal hemorrhage, unspecified: Secondary | ICD-10-CM | POA: Diagnosis not present

## 2015-02-02 DIAGNOSIS — M6281 Muscle weakness (generalized): Secondary | ICD-10-CM | POA: Diagnosis not present

## 2015-02-03 DIAGNOSIS — Z432 Encounter for attention to ileostomy: Secondary | ICD-10-CM | POA: Diagnosis not present

## 2015-02-03 DIAGNOSIS — R1312 Dysphagia, oropharyngeal phase: Secondary | ICD-10-CM | POA: Diagnosis not present

## 2015-02-03 DIAGNOSIS — K922 Gastrointestinal hemorrhage, unspecified: Secondary | ICD-10-CM | POA: Diagnosis not present

## 2015-02-03 DIAGNOSIS — R278 Other lack of coordination: Secondary | ICD-10-CM | POA: Diagnosis not present

## 2015-02-03 DIAGNOSIS — M6281 Muscle weakness (generalized): Secondary | ICD-10-CM | POA: Diagnosis not present

## 2015-02-03 DIAGNOSIS — Z9181 History of falling: Secondary | ICD-10-CM | POA: Diagnosis not present

## 2015-02-04 DIAGNOSIS — Z9181 History of falling: Secondary | ICD-10-CM | POA: Diagnosis not present

## 2015-02-04 DIAGNOSIS — R262 Difficulty in walking, not elsewhere classified: Secondary | ICD-10-CM | POA: Diagnosis not present

## 2015-02-04 DIAGNOSIS — Z432 Encounter for attention to ileostomy: Secondary | ICD-10-CM | POA: Diagnosis not present

## 2015-02-04 DIAGNOSIS — K922 Gastrointestinal hemorrhage, unspecified: Secondary | ICD-10-CM | POA: Diagnosis not present

## 2015-02-04 DIAGNOSIS — M6281 Muscle weakness (generalized): Secondary | ICD-10-CM | POA: Diagnosis not present

## 2015-02-04 DIAGNOSIS — R278 Other lack of coordination: Secondary | ICD-10-CM | POA: Diagnosis not present

## 2015-02-05 ENCOUNTER — Encounter (HOSPITAL_COMMUNITY)
Admission: AD | Admit: 2015-02-05 | Discharge: 2015-02-05 | Disposition: A | Discharge status: Home / Self Care | Payer: Commercial Managed Care - HMO | Source: Skilled Nursing Facility | Attending: Internal Medicine | Admitting: Internal Medicine

## 2015-02-05 DIAGNOSIS — K922 Gastrointestinal hemorrhage, unspecified: Secondary | ICD-10-CM | POA: Diagnosis not present

## 2015-02-05 DIAGNOSIS — R278 Other lack of coordination: Secondary | ICD-10-CM | POA: Diagnosis not present

## 2015-02-05 DIAGNOSIS — Z432 Encounter for attention to ileostomy: Secondary | ICD-10-CM | POA: Diagnosis not present

## 2015-02-05 DIAGNOSIS — R262 Difficulty in walking, not elsewhere classified: Secondary | ICD-10-CM | POA: Diagnosis not present

## 2015-02-05 DIAGNOSIS — Z9181 History of falling: Secondary | ICD-10-CM | POA: Diagnosis not present

## 2015-02-05 DIAGNOSIS — Z79890 Hormone replacement therapy (postmenopausal): Secondary | ICD-10-CM | POA: Insufficient documentation

## 2015-02-05 DIAGNOSIS — M6281 Muscle weakness (generalized): Secondary | ICD-10-CM | POA: Diagnosis not present

## 2015-02-05 LAB — CBC WITH DIFFERENTIAL/PLATELET
BASOS PCT: 2 % — AB (ref 0–1)
Basophils Absolute: 0.1 10*3/uL (ref 0.0–0.1)
EOS ABS: 0.2 10*3/uL (ref 0.0–0.7)
EOS PCT: 3 % (ref 0–5)
HEMATOCRIT: 28.9 % — AB (ref 36.0–46.0)
HEMOGLOBIN: 9 g/dL — AB (ref 12.0–15.0)
LYMPHS PCT: 30 % (ref 12–46)
Lymphs Abs: 2 10*3/uL (ref 0.7–4.0)
MCH: 26 pg (ref 26.0–34.0)
MCHC: 31.1 g/dL (ref 30.0–36.0)
MCV: 83.5 fL (ref 78.0–100.0)
MONOS PCT: 9 % (ref 3–12)
Monocytes Absolute: 0.6 10*3/uL (ref 0.1–1.0)
NEUTROS ABS: 3.7 10*3/uL (ref 1.7–7.7)
Neutrophils Relative %: 57 % (ref 43–77)
Platelets: 314 10*3/uL (ref 150–400)
RBC: 3.46 MIL/uL — ABNORMAL LOW (ref 3.87–5.11)
RDW: 14.6 % (ref 11.5–15.5)
WBC: 6.6 10*3/uL (ref 4.0–10.5)

## 2015-02-06 DIAGNOSIS — K922 Gastrointestinal hemorrhage, unspecified: Secondary | ICD-10-CM | POA: Diagnosis not present

## 2015-02-06 DIAGNOSIS — R278 Other lack of coordination: Secondary | ICD-10-CM | POA: Diagnosis not present

## 2015-02-06 DIAGNOSIS — Z9181 History of falling: Secondary | ICD-10-CM | POA: Diagnosis not present

## 2015-02-06 DIAGNOSIS — Z432 Encounter for attention to ileostomy: Secondary | ICD-10-CM | POA: Diagnosis not present

## 2015-02-06 DIAGNOSIS — M6281 Muscle weakness (generalized): Secondary | ICD-10-CM | POA: Diagnosis not present

## 2015-02-06 DIAGNOSIS — R262 Difficulty in walking, not elsewhere classified: Secondary | ICD-10-CM | POA: Diagnosis not present

## 2015-02-08 DIAGNOSIS — D500 Iron deficiency anemia secondary to blood loss (chronic): Secondary | ICD-10-CM | POA: Diagnosis not present

## 2015-02-08 DIAGNOSIS — C180 Malignant neoplasm of cecum: Secondary | ICD-10-CM | POA: Diagnosis not present

## 2015-02-09 DIAGNOSIS — Z9181 History of falling: Secondary | ICD-10-CM | POA: Diagnosis not present

## 2015-02-09 DIAGNOSIS — K922 Gastrointestinal hemorrhage, unspecified: Secondary | ICD-10-CM | POA: Diagnosis not present

## 2015-02-09 DIAGNOSIS — R262 Difficulty in walking, not elsewhere classified: Secondary | ICD-10-CM | POA: Diagnosis not present

## 2015-02-09 DIAGNOSIS — Z432 Encounter for attention to ileostomy: Secondary | ICD-10-CM | POA: Diagnosis not present

## 2015-02-09 DIAGNOSIS — M6281 Muscle weakness (generalized): Secondary | ICD-10-CM | POA: Diagnosis not present

## 2015-02-09 DIAGNOSIS — R278 Other lack of coordination: Secondary | ICD-10-CM | POA: Diagnosis not present

## 2015-02-09 NOTE — Progress Notes (Signed)
Subjective: This is documentation from progress note from the fifth in the skilled care facility. She has no complaints. She's had some abdominal pain which is incisional response to Tylenol and she has not needed anything else at this time.  Objective: Vital signs in last 24 hours:   Weight change:     Intake/Output from previous day:    PHYSICAL EXAM General appearance: Thin and confused  Her chest is clear. Her heart is regular. Her abdomen is soft colostomy appears to be functioning well  Lab Results:  No results found for this or any previous visit (from the past 48 hour(s)).  ABGS No results for input(s): PHART, PO2ART, TCO2, HCO3 in the last 72 hours.  Invalid input(s): PCO2 CULTURES No results found for this or any previous visit (from the past 240 hour(s)). Studies/Results: No results found.  Medications:  Prior to Admission:  Prescriptions prior to admission  Medication Sig Dispense Refill Last Dose  . amLODipine (NORVASC) 10 MG tablet Take 1 tablet (10 mg total) by mouth daily.   09/25/2014 at Unknown time  . feeding supplement, ENSURE COMPLETE, (ENSURE COMPLETE) LIQD Take 237 mLs by mouth 2 (two) times daily between meals.   Unknown at Unknown time  . hydrALAZINE (APRESOLINE) 25 MG tablet Take 2 tablets (50 mg total) by mouth every 8 (eight) hours.   09/25/2014 at Unknown time  . metoprolol (LOPRESSOR) 50 MG tablet Take 1 tablet (50 mg total) by mouth 2 (two) times daily.   09/25/2014 at Unknown time  . mirtazapine (REMERON) 7.5 MG tablet Take 1 tablet (7.5 mg total) by mouth at bedtime.   09/25/2014 at Unknown time  . pantoprazole (PROTONIX) 40 MG tablet Take 1 tablet (40 mg total) by mouth daily before breakfast. 30 tablet 0 09/25/2014 at Unknown time   Scheduled: Continuous: PRN:  Assesment: She has colon cancer and has had a diverting colostomy. She has tolerated well so far. She has been having trouble with GI bleeding and that has apparently resolved. She has  protein calorie malnutrition and hopefully that will improve now that she's had her surgery. She had fracture in her left leg and is undergoing therapy for that Active Problems:   Blood loss anemia   Protein-calorie malnutrition, severe   Left femoral shaft fracture   GI bleed   Essential hypertension   Rectal bleeding    Plan: Continue current treatments.    LOS: 136 days   Kalan Rinn L 02/09/2015, 8:07 AM

## 2015-02-10 DIAGNOSIS — R262 Difficulty in walking, not elsewhere classified: Secondary | ICD-10-CM | POA: Diagnosis not present

## 2015-02-10 DIAGNOSIS — Z432 Encounter for attention to ileostomy: Secondary | ICD-10-CM | POA: Diagnosis not present

## 2015-02-10 DIAGNOSIS — R278 Other lack of coordination: Secondary | ICD-10-CM | POA: Diagnosis not present

## 2015-02-10 DIAGNOSIS — Z9181 History of falling: Secondary | ICD-10-CM | POA: Diagnosis not present

## 2015-02-10 DIAGNOSIS — M6281 Muscle weakness (generalized): Secondary | ICD-10-CM | POA: Diagnosis not present

## 2015-02-10 DIAGNOSIS — K922 Gastrointestinal hemorrhage, unspecified: Secondary | ICD-10-CM | POA: Diagnosis not present

## 2015-02-11 DIAGNOSIS — M6281 Muscle weakness (generalized): Secondary | ICD-10-CM | POA: Diagnosis not present

## 2015-02-11 DIAGNOSIS — Z432 Encounter for attention to ileostomy: Secondary | ICD-10-CM | POA: Diagnosis not present

## 2015-02-11 DIAGNOSIS — K922 Gastrointestinal hemorrhage, unspecified: Secondary | ICD-10-CM | POA: Diagnosis not present

## 2015-02-11 DIAGNOSIS — Z9181 History of falling: Secondary | ICD-10-CM | POA: Diagnosis not present

## 2015-02-11 DIAGNOSIS — R262 Difficulty in walking, not elsewhere classified: Secondary | ICD-10-CM | POA: Diagnosis not present

## 2015-02-11 DIAGNOSIS — R278 Other lack of coordination: Secondary | ICD-10-CM | POA: Diagnosis not present

## 2015-02-12 DIAGNOSIS — K922 Gastrointestinal hemorrhage, unspecified: Secondary | ICD-10-CM | POA: Diagnosis not present

## 2015-02-12 DIAGNOSIS — Z9181 History of falling: Secondary | ICD-10-CM | POA: Diagnosis not present

## 2015-02-12 DIAGNOSIS — Z432 Encounter for attention to ileostomy: Secondary | ICD-10-CM | POA: Diagnosis not present

## 2015-02-12 DIAGNOSIS — M6281 Muscle weakness (generalized): Secondary | ICD-10-CM | POA: Diagnosis not present

## 2015-02-12 DIAGNOSIS — R278 Other lack of coordination: Secondary | ICD-10-CM | POA: Diagnosis not present

## 2015-02-12 DIAGNOSIS — R262 Difficulty in walking, not elsewhere classified: Secondary | ICD-10-CM | POA: Diagnosis not present

## 2015-02-13 DIAGNOSIS — R262 Difficulty in walking, not elsewhere classified: Secondary | ICD-10-CM | POA: Diagnosis not present

## 2015-02-13 DIAGNOSIS — K922 Gastrointestinal hemorrhage, unspecified: Secondary | ICD-10-CM | POA: Diagnosis not present

## 2015-02-13 DIAGNOSIS — M6281 Muscle weakness (generalized): Secondary | ICD-10-CM | POA: Diagnosis not present

## 2015-02-13 DIAGNOSIS — Z432 Encounter for attention to ileostomy: Secondary | ICD-10-CM | POA: Diagnosis not present

## 2015-02-13 DIAGNOSIS — R278 Other lack of coordination: Secondary | ICD-10-CM | POA: Diagnosis not present

## 2015-02-13 DIAGNOSIS — Z9181 History of falling: Secondary | ICD-10-CM | POA: Diagnosis not present

## 2015-02-16 DIAGNOSIS — R262 Difficulty in walking, not elsewhere classified: Secondary | ICD-10-CM | POA: Diagnosis not present

## 2015-02-16 DIAGNOSIS — Z9181 History of falling: Secondary | ICD-10-CM | POA: Diagnosis not present

## 2015-02-16 DIAGNOSIS — M6281 Muscle weakness (generalized): Secondary | ICD-10-CM | POA: Diagnosis not present

## 2015-02-16 DIAGNOSIS — Z432 Encounter for attention to ileostomy: Secondary | ICD-10-CM | POA: Diagnosis not present

## 2015-02-16 DIAGNOSIS — R278 Other lack of coordination: Secondary | ICD-10-CM | POA: Diagnosis not present

## 2015-02-16 DIAGNOSIS — K922 Gastrointestinal hemorrhage, unspecified: Secondary | ICD-10-CM | POA: Diagnosis not present

## 2015-02-17 DIAGNOSIS — R262 Difficulty in walking, not elsewhere classified: Secondary | ICD-10-CM | POA: Diagnosis not present

## 2015-02-17 DIAGNOSIS — R278 Other lack of coordination: Secondary | ICD-10-CM | POA: Diagnosis not present

## 2015-02-17 DIAGNOSIS — Z432 Encounter for attention to ileostomy: Secondary | ICD-10-CM | POA: Diagnosis not present

## 2015-02-17 DIAGNOSIS — M6281 Muscle weakness (generalized): Secondary | ICD-10-CM | POA: Diagnosis not present

## 2015-02-17 DIAGNOSIS — K922 Gastrointestinal hemorrhage, unspecified: Secondary | ICD-10-CM | POA: Diagnosis not present

## 2015-02-17 DIAGNOSIS — Z9181 History of falling: Secondary | ICD-10-CM | POA: Diagnosis not present

## 2015-02-18 DIAGNOSIS — M6281 Muscle weakness (generalized): Secondary | ICD-10-CM | POA: Diagnosis not present

## 2015-02-18 DIAGNOSIS — R278 Other lack of coordination: Secondary | ICD-10-CM | POA: Diagnosis not present

## 2015-02-18 DIAGNOSIS — Z432 Encounter for attention to ileostomy: Secondary | ICD-10-CM | POA: Diagnosis not present

## 2015-02-18 DIAGNOSIS — Z9181 History of falling: Secondary | ICD-10-CM | POA: Diagnosis not present

## 2015-02-18 DIAGNOSIS — R262 Difficulty in walking, not elsewhere classified: Secondary | ICD-10-CM | POA: Diagnosis not present

## 2015-02-18 DIAGNOSIS — K922 Gastrointestinal hemorrhage, unspecified: Secondary | ICD-10-CM | POA: Diagnosis not present

## 2015-02-19 DIAGNOSIS — Z432 Encounter for attention to ileostomy: Secondary | ICD-10-CM | POA: Diagnosis not present

## 2015-02-19 DIAGNOSIS — M6281 Muscle weakness (generalized): Secondary | ICD-10-CM | POA: Diagnosis not present

## 2015-02-19 DIAGNOSIS — Z9181 History of falling: Secondary | ICD-10-CM | POA: Diagnosis not present

## 2015-02-19 DIAGNOSIS — R262 Difficulty in walking, not elsewhere classified: Secondary | ICD-10-CM | POA: Diagnosis not present

## 2015-02-19 DIAGNOSIS — K922 Gastrointestinal hemorrhage, unspecified: Secondary | ICD-10-CM | POA: Diagnosis not present

## 2015-02-19 DIAGNOSIS — R278 Other lack of coordination: Secondary | ICD-10-CM | POA: Diagnosis not present

## 2015-02-20 DIAGNOSIS — Z9181 History of falling: Secondary | ICD-10-CM | POA: Diagnosis not present

## 2015-02-20 DIAGNOSIS — R278 Other lack of coordination: Secondary | ICD-10-CM | POA: Diagnosis not present

## 2015-02-20 DIAGNOSIS — M6281 Muscle weakness (generalized): Secondary | ICD-10-CM | POA: Diagnosis not present

## 2015-02-20 DIAGNOSIS — Z432 Encounter for attention to ileostomy: Secondary | ICD-10-CM | POA: Diagnosis not present

## 2015-02-20 DIAGNOSIS — K922 Gastrointestinal hemorrhage, unspecified: Secondary | ICD-10-CM | POA: Diagnosis not present

## 2015-02-20 DIAGNOSIS — R262 Difficulty in walking, not elsewhere classified: Secondary | ICD-10-CM | POA: Diagnosis not present

## 2015-02-22 DIAGNOSIS — Z9181 History of falling: Secondary | ICD-10-CM | POA: Diagnosis not present

## 2015-02-22 DIAGNOSIS — R262 Difficulty in walking, not elsewhere classified: Secondary | ICD-10-CM | POA: Diagnosis not present

## 2015-02-22 DIAGNOSIS — Z432 Encounter for attention to ileostomy: Secondary | ICD-10-CM | POA: Diagnosis not present

## 2015-02-22 DIAGNOSIS — M6281 Muscle weakness (generalized): Secondary | ICD-10-CM | POA: Diagnosis not present

## 2015-02-22 DIAGNOSIS — K922 Gastrointestinal hemorrhage, unspecified: Secondary | ICD-10-CM | POA: Diagnosis not present

## 2015-02-22 DIAGNOSIS — R278 Other lack of coordination: Secondary | ICD-10-CM | POA: Diagnosis not present

## 2015-02-23 DIAGNOSIS — R278 Other lack of coordination: Secondary | ICD-10-CM | POA: Diagnosis not present

## 2015-02-23 DIAGNOSIS — Z432 Encounter for attention to ileostomy: Secondary | ICD-10-CM | POA: Diagnosis not present

## 2015-02-23 DIAGNOSIS — K922 Gastrointestinal hemorrhage, unspecified: Secondary | ICD-10-CM | POA: Diagnosis not present

## 2015-02-23 DIAGNOSIS — M6281 Muscle weakness (generalized): Secondary | ICD-10-CM | POA: Diagnosis not present

## 2015-02-23 DIAGNOSIS — Z9181 History of falling: Secondary | ICD-10-CM | POA: Diagnosis not present

## 2015-02-23 DIAGNOSIS — R262 Difficulty in walking, not elsewhere classified: Secondary | ICD-10-CM | POA: Diagnosis not present

## 2015-02-24 DIAGNOSIS — K922 Gastrointestinal hemorrhage, unspecified: Secondary | ICD-10-CM | POA: Diagnosis not present

## 2015-02-24 DIAGNOSIS — R262 Difficulty in walking, not elsewhere classified: Secondary | ICD-10-CM | POA: Diagnosis not present

## 2015-02-24 DIAGNOSIS — Z432 Encounter for attention to ileostomy: Secondary | ICD-10-CM | POA: Diagnosis not present

## 2015-02-24 DIAGNOSIS — R278 Other lack of coordination: Secondary | ICD-10-CM | POA: Diagnosis not present

## 2015-02-24 DIAGNOSIS — Z9181 History of falling: Secondary | ICD-10-CM | POA: Diagnosis not present

## 2015-02-24 DIAGNOSIS — M6281 Muscle weakness (generalized): Secondary | ICD-10-CM | POA: Diagnosis not present

## 2015-02-25 DIAGNOSIS — K922 Gastrointestinal hemorrhage, unspecified: Secondary | ICD-10-CM | POA: Diagnosis not present

## 2015-02-25 DIAGNOSIS — Z9181 History of falling: Secondary | ICD-10-CM | POA: Diagnosis not present

## 2015-02-25 DIAGNOSIS — Z432 Encounter for attention to ileostomy: Secondary | ICD-10-CM | POA: Diagnosis not present

## 2015-02-25 DIAGNOSIS — M6281 Muscle weakness (generalized): Secondary | ICD-10-CM | POA: Diagnosis not present

## 2015-02-25 DIAGNOSIS — R278 Other lack of coordination: Secondary | ICD-10-CM | POA: Diagnosis not present

## 2015-02-25 DIAGNOSIS — R262 Difficulty in walking, not elsewhere classified: Secondary | ICD-10-CM | POA: Diagnosis not present

## 2015-02-26 DIAGNOSIS — R278 Other lack of coordination: Secondary | ICD-10-CM | POA: Diagnosis not present

## 2015-02-26 DIAGNOSIS — Z432 Encounter for attention to ileostomy: Secondary | ICD-10-CM | POA: Diagnosis not present

## 2015-02-26 DIAGNOSIS — M6281 Muscle weakness (generalized): Secondary | ICD-10-CM | POA: Diagnosis not present

## 2015-02-26 DIAGNOSIS — Z9181 History of falling: Secondary | ICD-10-CM | POA: Diagnosis not present

## 2015-02-26 DIAGNOSIS — R262 Difficulty in walking, not elsewhere classified: Secondary | ICD-10-CM | POA: Diagnosis not present

## 2015-02-26 DIAGNOSIS — K922 Gastrointestinal hemorrhage, unspecified: Secondary | ICD-10-CM | POA: Diagnosis not present

## 2015-03-19 NOTE — Patient Outreach (Signed)
[place] [facility]) Care Management  03/19/2015  Alicia Guerra 09/28/1923 [id number]   Referral from [facility] Tier 4 list, assigned to Sherrin Daisy, RN for patient outreach.  [name] L. Elbert Ewings [facility] Care Management Assistant [phone]

## 2015-03-25 DIAGNOSIS — C180 Malignant neoplasm of cecum: Secondary | ICD-10-CM | POA: Diagnosis not present

## 2015-03-26 NOTE — Progress Notes (Signed)
This is documentation of my visit of 03/25/2015. She says she feels well. She has no complaints regarding her ostomy. She is eating better. She's no longer having any bleeding.  She is awake and alert. She generally looks much better than previously. She has no other new problems. Her chest is clear. Her heart is regular.  She had colon cancer and has had diverting colostomy. She had significant weight loss and had recurrent anemia which is improved. Overall she looks the best she has looked in several months.  Continue with current treatments

## 2015-04-16 NOTE — Patient Outreach (Signed)
[place] [facility]) Care Management  04/16/2015  JADAE FEDERLE May 11, 1924 [id number]   Attempted to contact patient past referral from [facility] Tier 4 list.  Confirmed with [facility] that patient is a long term resident of their facility, with no discharge plans at present.  Candie Mile, RN, MSN [place] [phone] Fax [phone]

## 2015-04-17 ENCOUNTER — Encounter: Payer: Self-pay | Admitting: *Deleted

## 2015-04-17 NOTE — Patient Outreach (Signed)
[place] [facility]) Care Management  04/17/2015  Alicia Guerra [birth year] [id number]   [place] Tier 4 referral: Per previous note patient is long term resident at [facility].   Plan: will close case and send MD closure letter. Sherrin Daisy, RN BSN [place] Management Coordinator [facility] Care Management  [phone]

## 2015-04-19 DIAGNOSIS — C180 Malignant neoplasm of cecum: Secondary | ICD-10-CM | POA: Diagnosis not present

## 2015-04-20 NOTE — Progress Notes (Signed)
She says she feels okay. She has no new complaints. Her breathing is doing okay.  She is awake and alert. Her chest is clear. Her abdomen is soft.  She has a history of colon cancer and has had resection and has permanent colostomy. She is doing better in general. She has a history of a fracture in her leg and that is improving.  No change in treatments. Continue current medications.

## 2015-04-24 DIAGNOSIS — C182 Malignant neoplasm of ascending colon: Secondary | ICD-10-CM | POA: Diagnosis not present

## 2015-04-24 NOTE — Patient Outreach (Signed)
[place] [facility]) Care Management  04/24/2015  Alicia Guerra Oct 09, 1923 [id number]   Notification from Sherrin Daisy, RN to close case due to patient is residing in SNF long term.  Thanks, Ronnell Freshwater. [place], [place] Assistant Phone: [phone] Fax: [phone]

## 2015-05-04 DIAGNOSIS — Z48816 Encounter for surgical aftercare following surgery on the genitourinary system: Secondary | ICD-10-CM | POA: Diagnosis not present

## 2015-05-04 DIAGNOSIS — C182 Malignant neoplasm of ascending colon: Secondary | ICD-10-CM | POA: Diagnosis not present

## 2015-05-04 DIAGNOSIS — Z9181 History of falling: Secondary | ICD-10-CM | POA: Diagnosis not present

## 2015-05-04 DIAGNOSIS — Z432 Encounter for attention to ileostomy: Secondary | ICD-10-CM | POA: Diagnosis not present

## 2015-05-04 DIAGNOSIS — I129 Hypertensive chronic kidney disease with stage 1 through stage 4 chronic kidney disease, or unspecified chronic kidney disease: Secondary | ICD-10-CM | POA: Diagnosis not present

## 2015-05-04 DIAGNOSIS — R278 Other lack of coordination: Secondary | ICD-10-CM | POA: Diagnosis not present

## 2015-05-04 DIAGNOSIS — D649 Anemia, unspecified: Secondary | ICD-10-CM | POA: Diagnosis not present

## 2015-05-04 DIAGNOSIS — F419 Anxiety disorder, unspecified: Secondary | ICD-10-CM | POA: Diagnosis not present

## 2015-05-04 DIAGNOSIS — R1312 Dysphagia, oropharyngeal phase: Secondary | ICD-10-CM | POA: Diagnosis not present

## 2015-05-04 DIAGNOSIS — K589 Irritable bowel syndrome without diarrhea: Secondary | ICD-10-CM | POA: Diagnosis not present

## 2015-05-04 DIAGNOSIS — A047 Enterocolitis due to Clostridium difficile: Secondary | ICD-10-CM | POA: Diagnosis not present

## 2015-05-04 DIAGNOSIS — M6281 Muscle weakness (generalized): Secondary | ICD-10-CM | POA: Diagnosis not present

## 2015-05-04 DIAGNOSIS — C20 Malignant neoplasm of rectum: Secondary | ICD-10-CM | POA: Diagnosis not present

## 2015-05-04 DIAGNOSIS — I10 Essential (primary) hypertension: Secondary | ICD-10-CM | POA: Diagnosis not present

## 2015-05-04 DIAGNOSIS — R000 Tachycardia, unspecified: Secondary | ICD-10-CM | POA: Diagnosis not present

## 2015-05-04 DIAGNOSIS — K219 Gastro-esophageal reflux disease without esophagitis: Secondary | ICD-10-CM | POA: Diagnosis not present

## 2015-05-12 DIAGNOSIS — R1312 Dysphagia, oropharyngeal phase: Secondary | ICD-10-CM | POA: Diagnosis not present

## 2015-05-12 DIAGNOSIS — C180 Malignant neoplasm of cecum: Secondary | ICD-10-CM | POA: Diagnosis not present

## 2015-05-12 DIAGNOSIS — M6281 Muscle weakness (generalized): Secondary | ICD-10-CM | POA: Diagnosis not present

## 2015-05-12 DIAGNOSIS — A047 Enterocolitis due to Clostridium difficile: Secondary | ICD-10-CM | POA: Diagnosis not present

## 2015-05-12 DIAGNOSIS — D649 Anemia, unspecified: Secondary | ICD-10-CM | POA: Diagnosis not present

## 2015-05-12 DIAGNOSIS — I10 Essential (primary) hypertension: Secondary | ICD-10-CM | POA: Diagnosis not present

## 2015-05-12 DIAGNOSIS — Z48816 Encounter for surgical aftercare following surgery on the genitourinary system: Secondary | ICD-10-CM | POA: Diagnosis not present

## 2015-05-12 DIAGNOSIS — K219 Gastro-esophageal reflux disease without esophagitis: Secondary | ICD-10-CM | POA: Diagnosis not present

## 2015-05-12 DIAGNOSIS — Z9181 History of falling: Secondary | ICD-10-CM | POA: Diagnosis not present

## 2015-05-12 DIAGNOSIS — R278 Other lack of coordination: Secondary | ICD-10-CM | POA: Diagnosis not present

## 2015-05-16 DIAGNOSIS — C180 Malignant neoplasm of cecum: Secondary | ICD-10-CM | POA: Diagnosis not present

## 2015-05-17 NOTE — H&P (Signed)
Alicia Guerra MRN: [id number] DOB/AGE: [birth year] 79 y.o. Primary Care Physician:Alicia Guerra L, MD Admit date: 09/26/2014 Chief Complaint: Colon cancer HPI: This is an 81 year old who was transferred back to the skilled care facility after an admission at [facility] for revision of colostomy. She tolerated that well but does have Clostridium difficile. She says she feels okay. She has no other new complaints. She is still having some diarrhea. This is documentation of history and physical done on 05/16/2015  Past Medical History  Diagnosis Date  . Hypertension   . Arthritis   . Anemia   . History of blood transfusion     "today is the first time" (08/05/2014)   Past Surgical History  Procedure Laterality Date  . Cataract extraction Left   . Tonsillectomy    . Appendectomy    . Cholecystectomy    . Cystectomy      "had cyst taken off lower back"  . Esophagogastroduodenoscopy N/A 08/22/2014    [id number] ring at the gastro junction/small HH  . Orif femur fracture Left 08/07/2014    Procedure: OPEN REDUCTION INTERNAL FIXATION (ORIF)  LEFT FEMUR FRACTURE;  Surgeon: Alicia Butters, MD;  Location: [place];  Service: Orthopedics;  Laterality: Left;  . Colonoscopy N/A 09/26/2014    Procedure: COLONOSCOPY;  Surgeon: Alicia Binder, MD;  [facility];  Service: Endoscopy;  Laterality: N/A;  1030am   She's had colon cancer     History reviewed. No pertinent family history.  Social History:  reports that she has quit smoking. Her smoking use included Cigarettes. She has a 4 pack-year smoking history. She has never used smokeless tobacco. She reports that she does not drink alcohol or use illicit drugs.   Allergies: No Known Allergies  Medications Prior to Admission  Medication Sig Dispense Refill  . amLODipine (NORVASC) 10 MG tablet Take 1 tablet (10 mg total) by mouth daily.    . feeding supplement, ENSURE COMPLETE, (ENSURE COMPLETE) LIQD Take 237  mLs by mouth 2 (two) times daily between meals.    . hydrALAZINE (APRESOLINE) 25 MG tablet Take 2 tablets (50 mg total) by mouth every 8 (eight) hours.    . metoprolol (LOPRESSOR) 50 MG tablet Take 1 tablet (50 mg total) by mouth 2 (two) times daily.    . mirtazapine (REMERON) 7.5 MG tablet Take 1 tablet (7.5 mg total) by mouth at bedtime.    . pantoprazole (PROTONIX) 40 MG tablet Take 1 tablet (40 mg total) by mouth daily before breakfast. 30 tablet 0       [id number] from the symptoms mentioned above,there are no other symptoms referable to all systems reviewed.  Physical Exam: Blood pressure 146/87, pulse 71, temperature 97.7 F (36.5 C), temperature source Oral, resp. rate 16, SpO2 100 %. She is awake and alert. She is mildly confused as always. Her pupils are reactive nose and throat are clear mucous membranes are moist her neck is supple without masses. Her chest is clear. Her heart is regular. Her abdomen is soft surgical scar looks okay. Extremities showed no edema central nervous system examination shows that she is mildly confused   No results for input(s): WBC, NEUTROABS, HGB, HCT, MCV, PLT in the last 72 hours. No results for input(s): NA, K, CL, CO2, GLUCOSE, BUN, CREATININE, CALCIUM, MG in the last 72 hours.  Invalid input(s): PHOlablast2(ast:2,ALT:2,alkphos:2,bilitot:2,prot:2,albumin:2)@    No results found for this or any previous visit (from the past 240 hour(s)).   No results  found. Impression: She has colon cancer. She had GI bleeding from that. She has had revision of her colostomy. She had severe protein calorie malnutrition and she is receiving nutritional supplements. She has Clostridium difficile which is being treated Active Problems:   Blood loss anemia   Protein-calorie malnutrition, severe   Left femoral shaft fracture   GI bleed   Essential hypertension   Rectal bleeding     Plan: No change in treatments. Treat her C. difficile continue her other  treatments and follow      Alicia Guerra L   05/17/2015, 10:20 AM

## 2015-05-22 NOTE — H&P (Signed)
NAME:  Alicia Guerra, Alicia Guerra                  ACCOUNT NO.:  MEDICAL RECORD NO.:  [id number]  LOCATION:                                 FACILITY:  PHYSICIAN:  Derisha Funderburke L. Luan Pulling, M.D.DATE OF BIRTH:  1923/09/10  DATE OF ADMISSION: DATE OF DISCHARGE:  LH                             HISTORY & PHYSICAL   ADDENDUM:  In addition to the problems mentioned she has severe protein calorie malnutrition, which I think is improving, atherosclerosis of the aorta, atrial fibrillation, and had a previous fracture of her femur. She was previously septic, but that has resolved.  She previously has acute kidney injury, but that has resolved.  She does have malignant neoplasm of the rectum/colon and her colostomy/ileostomy has been removed.     Johnte Portnoy L. Luan Pulling, M.D.     ELH/MEDQ  D:  05/17/2015  T:  05/17/2015  Job:  [id number]

## 2015-05-28 DIAGNOSIS — M6281 Muscle weakness (generalized): Secondary | ICD-10-CM | POA: Diagnosis not present

## 2015-05-28 DIAGNOSIS — K219 Gastro-esophageal reflux disease without esophagitis: Secondary | ICD-10-CM | POA: Diagnosis not present

## 2015-05-28 DIAGNOSIS — N189 Chronic kidney disease, unspecified: Secondary | ICD-10-CM | POA: Diagnosis not present

## 2015-05-28 DIAGNOSIS — A047 Enterocolitis due to Clostridium difficile: Secondary | ICD-10-CM | POA: Diagnosis not present

## 2015-05-28 DIAGNOSIS — R1312 Dysphagia, oropharyngeal phase: Secondary | ICD-10-CM | POA: Diagnosis not present

## 2015-05-28 DIAGNOSIS — Z48816 Encounter for surgical aftercare following surgery on the genitourinary system: Secondary | ICD-10-CM | POA: Diagnosis not present

## 2015-05-28 DIAGNOSIS — R278 Other lack of coordination: Secondary | ICD-10-CM | POA: Diagnosis not present

## 2015-05-29 DIAGNOSIS — R278 Other lack of coordination: Secondary | ICD-10-CM | POA: Diagnosis not present

## 2015-05-29 DIAGNOSIS — K219 Gastro-esophageal reflux disease without esophagitis: Secondary | ICD-10-CM | POA: Diagnosis not present

## 2015-05-29 DIAGNOSIS — N189 Chronic kidney disease, unspecified: Secondary | ICD-10-CM | POA: Diagnosis not present

## 2015-05-29 DIAGNOSIS — A047 Enterocolitis due to Clostridium difficile: Secondary | ICD-10-CM | POA: Diagnosis not present

## 2015-05-29 DIAGNOSIS — Z48816 Encounter for surgical aftercare following surgery on the genitourinary system: Secondary | ICD-10-CM | POA: Diagnosis not present

## 2015-05-29 DIAGNOSIS — R1312 Dysphagia, oropharyngeal phase: Secondary | ICD-10-CM | POA: Diagnosis not present

## 2015-05-29 DIAGNOSIS — M6281 Muscle weakness (generalized): Secondary | ICD-10-CM | POA: Diagnosis not present

## 2015-06-01 DIAGNOSIS — Z48816 Encounter for surgical aftercare following surgery on the genitourinary system: Secondary | ICD-10-CM | POA: Diagnosis not present

## 2015-06-01 DIAGNOSIS — R1312 Dysphagia, oropharyngeal phase: Secondary | ICD-10-CM | POA: Diagnosis not present

## 2015-06-01 DIAGNOSIS — M6281 Muscle weakness (generalized): Secondary | ICD-10-CM | POA: Diagnosis not present

## 2015-06-01 DIAGNOSIS — K219 Gastro-esophageal reflux disease without esophagitis: Secondary | ICD-10-CM | POA: Diagnosis not present

## 2015-06-01 DIAGNOSIS — R278 Other lack of coordination: Secondary | ICD-10-CM | POA: Diagnosis not present

## 2015-06-01 DIAGNOSIS — N189 Chronic kidney disease, unspecified: Secondary | ICD-10-CM | POA: Diagnosis not present

## 2015-06-01 DIAGNOSIS — A047 Enterocolitis due to Clostridium difficile: Secondary | ICD-10-CM | POA: Diagnosis not present

## 2015-06-02 DIAGNOSIS — R1312 Dysphagia, oropharyngeal phase: Secondary | ICD-10-CM | POA: Diagnosis not present

## 2015-06-02 DIAGNOSIS — R278 Other lack of coordination: Secondary | ICD-10-CM | POA: Diagnosis not present

## 2015-06-02 DIAGNOSIS — N189 Chronic kidney disease, unspecified: Secondary | ICD-10-CM | POA: Diagnosis not present

## 2015-06-02 DIAGNOSIS — Z48816 Encounter for surgical aftercare following surgery on the genitourinary system: Secondary | ICD-10-CM | POA: Diagnosis not present

## 2015-06-02 DIAGNOSIS — M6281 Muscle weakness (generalized): Secondary | ICD-10-CM | POA: Diagnosis not present

## 2015-06-02 DIAGNOSIS — K219 Gastro-esophageal reflux disease without esophagitis: Secondary | ICD-10-CM | POA: Diagnosis not present

## 2015-06-02 DIAGNOSIS — A047 Enterocolitis due to Clostridium difficile: Secondary | ICD-10-CM | POA: Diagnosis not present

## 2015-06-03 DIAGNOSIS — R1312 Dysphagia, oropharyngeal phase: Secondary | ICD-10-CM | POA: Diagnosis not present

## 2015-06-03 DIAGNOSIS — A047 Enterocolitis due to Clostridium difficile: Secondary | ICD-10-CM | POA: Diagnosis not present

## 2015-06-03 DIAGNOSIS — R278 Other lack of coordination: Secondary | ICD-10-CM | POA: Diagnosis not present

## 2015-06-03 DIAGNOSIS — K219 Gastro-esophageal reflux disease without esophagitis: Secondary | ICD-10-CM | POA: Diagnosis not present

## 2015-06-03 DIAGNOSIS — N189 Chronic kidney disease, unspecified: Secondary | ICD-10-CM | POA: Diagnosis not present

## 2015-06-03 DIAGNOSIS — M6281 Muscle weakness (generalized): Secondary | ICD-10-CM | POA: Diagnosis not present

## 2015-06-03 DIAGNOSIS — Z48816 Encounter for surgical aftercare following surgery on the genitourinary system: Secondary | ICD-10-CM | POA: Diagnosis not present

## 2015-06-04 DIAGNOSIS — K219 Gastro-esophageal reflux disease without esophagitis: Secondary | ICD-10-CM | POA: Diagnosis not present

## 2015-06-04 DIAGNOSIS — R278 Other lack of coordination: Secondary | ICD-10-CM | POA: Diagnosis not present

## 2015-06-04 DIAGNOSIS — C182 Malignant neoplasm of ascending colon: Secondary | ICD-10-CM | POA: Diagnosis not present

## 2015-06-04 DIAGNOSIS — A047 Enterocolitis due to Clostridium difficile: Secondary | ICD-10-CM | POA: Diagnosis not present

## 2015-06-04 DIAGNOSIS — N189 Chronic kidney disease, unspecified: Secondary | ICD-10-CM | POA: Diagnosis not present

## 2015-06-04 DIAGNOSIS — Z48816 Encounter for surgical aftercare following surgery on the genitourinary system: Secondary | ICD-10-CM | POA: Diagnosis not present

## 2015-06-04 DIAGNOSIS — M6281 Muscle weakness (generalized): Secondary | ICD-10-CM | POA: Diagnosis not present

## 2015-06-04 DIAGNOSIS — R1312 Dysphagia, oropharyngeal phase: Secondary | ICD-10-CM | POA: Diagnosis not present

## 2015-06-05 DIAGNOSIS — M6281 Muscle weakness (generalized): Secondary | ICD-10-CM | POA: Diagnosis not present

## 2015-06-05 DIAGNOSIS — A047 Enterocolitis due to Clostridium difficile: Secondary | ICD-10-CM | POA: Diagnosis not present

## 2015-06-05 DIAGNOSIS — K219 Gastro-esophageal reflux disease without esophagitis: Secondary | ICD-10-CM | POA: Diagnosis not present

## 2015-06-05 DIAGNOSIS — N189 Chronic kidney disease, unspecified: Secondary | ICD-10-CM | POA: Diagnosis not present

## 2015-06-05 DIAGNOSIS — R1312 Dysphagia, oropharyngeal phase: Secondary | ICD-10-CM | POA: Diagnosis not present

## 2015-06-05 DIAGNOSIS — R278 Other lack of coordination: Secondary | ICD-10-CM | POA: Diagnosis not present

## 2015-06-05 DIAGNOSIS — Z48816 Encounter for surgical aftercare following surgery on the genitourinary system: Secondary | ICD-10-CM | POA: Diagnosis not present

## 2015-06-08 DIAGNOSIS — R262 Difficulty in walking, not elsewhere classified: Secondary | ICD-10-CM | POA: Diagnosis not present

## 2015-06-08 DIAGNOSIS — Z48816 Encounter for surgical aftercare following surgery on the genitourinary system: Secondary | ICD-10-CM | POA: Diagnosis not present

## 2015-06-08 DIAGNOSIS — K621 Rectal polyp: Secondary | ICD-10-CM | POA: Diagnosis not present

## 2015-06-08 DIAGNOSIS — M6281 Muscle weakness (generalized): Secondary | ICD-10-CM | POA: Diagnosis not present

## 2015-06-08 DIAGNOSIS — K219 Gastro-esophageal reflux disease without esophagitis: Secondary | ICD-10-CM | POA: Diagnosis not present

## 2015-06-08 DIAGNOSIS — R278 Other lack of coordination: Secondary | ICD-10-CM | POA: Diagnosis not present

## 2015-06-08 DIAGNOSIS — A047 Enterocolitis due to Clostridium difficile: Secondary | ICD-10-CM | POA: Diagnosis not present

## 2015-06-09 DIAGNOSIS — R278 Other lack of coordination: Secondary | ICD-10-CM | POA: Diagnosis not present

## 2015-06-09 DIAGNOSIS — M6281 Muscle weakness (generalized): Secondary | ICD-10-CM | POA: Diagnosis not present

## 2015-06-09 DIAGNOSIS — K219 Gastro-esophageal reflux disease without esophagitis: Secondary | ICD-10-CM | POA: Diagnosis not present

## 2015-06-09 DIAGNOSIS — R262 Difficulty in walking, not elsewhere classified: Secondary | ICD-10-CM | POA: Diagnosis not present

## 2015-06-09 DIAGNOSIS — A047 Enterocolitis due to Clostridium difficile: Secondary | ICD-10-CM | POA: Diagnosis not present

## 2015-06-09 DIAGNOSIS — Z48816 Encounter for surgical aftercare following surgery on the genitourinary system: Secondary | ICD-10-CM | POA: Diagnosis not present

## 2015-06-09 DIAGNOSIS — K621 Rectal polyp: Secondary | ICD-10-CM | POA: Diagnosis not present

## 2015-06-10 DIAGNOSIS — Z48816 Encounter for surgical aftercare following surgery on the genitourinary system: Secondary | ICD-10-CM | POA: Diagnosis not present

## 2015-06-10 DIAGNOSIS — K219 Gastro-esophageal reflux disease without esophagitis: Secondary | ICD-10-CM | POA: Diagnosis not present

## 2015-06-10 DIAGNOSIS — M6281 Muscle weakness (generalized): Secondary | ICD-10-CM | POA: Diagnosis not present

## 2015-06-10 DIAGNOSIS — R278 Other lack of coordination: Secondary | ICD-10-CM | POA: Diagnosis not present

## 2015-06-10 DIAGNOSIS — R262 Difficulty in walking, not elsewhere classified: Secondary | ICD-10-CM | POA: Diagnosis not present

## 2015-06-10 DIAGNOSIS — K621 Rectal polyp: Secondary | ICD-10-CM | POA: Diagnosis not present

## 2015-06-10 DIAGNOSIS — A047 Enterocolitis due to Clostridium difficile: Secondary | ICD-10-CM | POA: Diagnosis not present

## 2015-06-11 DIAGNOSIS — A047 Enterocolitis due to Clostridium difficile: Secondary | ICD-10-CM | POA: Diagnosis not present

## 2015-06-11 DIAGNOSIS — K219 Gastro-esophageal reflux disease without esophagitis: Secondary | ICD-10-CM | POA: Diagnosis not present

## 2015-06-11 DIAGNOSIS — R278 Other lack of coordination: Secondary | ICD-10-CM | POA: Diagnosis not present

## 2015-06-11 DIAGNOSIS — R262 Difficulty in walking, not elsewhere classified: Secondary | ICD-10-CM | POA: Diagnosis not present

## 2015-06-11 DIAGNOSIS — Z48816 Encounter for surgical aftercare following surgery on the genitourinary system: Secondary | ICD-10-CM | POA: Diagnosis not present

## 2015-06-11 DIAGNOSIS — M6281 Muscle weakness (generalized): Secondary | ICD-10-CM | POA: Diagnosis not present

## 2015-06-11 DIAGNOSIS — K621 Rectal polyp: Secondary | ICD-10-CM | POA: Diagnosis not present

## 2015-06-12 DIAGNOSIS — Z48816 Encounter for surgical aftercare following surgery on the genitourinary system: Secondary | ICD-10-CM | POA: Diagnosis not present

## 2015-06-12 DIAGNOSIS — M6281 Muscle weakness (generalized): Secondary | ICD-10-CM | POA: Diagnosis not present

## 2015-06-12 DIAGNOSIS — R278 Other lack of coordination: Secondary | ICD-10-CM | POA: Diagnosis not present

## 2015-06-12 DIAGNOSIS — R262 Difficulty in walking, not elsewhere classified: Secondary | ICD-10-CM | POA: Diagnosis not present

## 2015-06-12 DIAGNOSIS — K219 Gastro-esophageal reflux disease without esophagitis: Secondary | ICD-10-CM | POA: Diagnosis not present

## 2015-06-12 DIAGNOSIS — A047 Enterocolitis due to Clostridium difficile: Secondary | ICD-10-CM | POA: Diagnosis not present

## 2015-06-12 DIAGNOSIS — K621 Rectal polyp: Secondary | ICD-10-CM | POA: Diagnosis not present

## 2015-06-13 DIAGNOSIS — M6281 Muscle weakness (generalized): Secondary | ICD-10-CM | POA: Diagnosis not present

## 2015-06-13 DIAGNOSIS — Z48816 Encounter for surgical aftercare following surgery on the genitourinary system: Secondary | ICD-10-CM | POA: Diagnosis not present

## 2015-06-13 DIAGNOSIS — A047 Enterocolitis due to Clostridium difficile: Secondary | ICD-10-CM | POA: Diagnosis not present

## 2015-06-13 DIAGNOSIS — R278 Other lack of coordination: Secondary | ICD-10-CM | POA: Diagnosis not present

## 2015-06-13 DIAGNOSIS — R262 Difficulty in walking, not elsewhere classified: Secondary | ICD-10-CM | POA: Diagnosis not present

## 2015-06-13 DIAGNOSIS — K219 Gastro-esophageal reflux disease without esophagitis: Secondary | ICD-10-CM | POA: Diagnosis not present

## 2015-06-13 DIAGNOSIS — K621 Rectal polyp: Secondary | ICD-10-CM | POA: Diagnosis not present

## 2015-06-15 DIAGNOSIS — H2521 Age-related cataract, morgagnian type, right eye: Secondary | ICD-10-CM | POA: Diagnosis not present

## 2015-06-15 DIAGNOSIS — R262 Difficulty in walking, not elsewhere classified: Secondary | ICD-10-CM | POA: Diagnosis not present

## 2015-06-15 DIAGNOSIS — R278 Other lack of coordination: Secondary | ICD-10-CM | POA: Diagnosis not present

## 2015-06-15 DIAGNOSIS — K219 Gastro-esophageal reflux disease without esophagitis: Secondary | ICD-10-CM | POA: Diagnosis not present

## 2015-06-15 DIAGNOSIS — M6281 Muscle weakness (generalized): Secondary | ICD-10-CM | POA: Diagnosis not present

## 2015-06-15 DIAGNOSIS — H1712 Central corneal opacity, left eye: Secondary | ICD-10-CM | POA: Diagnosis not present

## 2015-06-15 DIAGNOSIS — A047 Enterocolitis due to Clostridium difficile: Secondary | ICD-10-CM | POA: Diagnosis not present

## 2015-06-15 DIAGNOSIS — K621 Rectal polyp: Secondary | ICD-10-CM | POA: Diagnosis not present

## 2015-06-15 DIAGNOSIS — H04123 Dry eye syndrome of bilateral lacrimal glands: Secondary | ICD-10-CM | POA: Diagnosis not present

## 2015-06-15 DIAGNOSIS — Z48816 Encounter for surgical aftercare following surgery on the genitourinary system: Secondary | ICD-10-CM | POA: Diagnosis not present

## 2015-06-15 DIAGNOSIS — Z961 Presence of intraocular lens: Secondary | ICD-10-CM | POA: Diagnosis not present

## 2015-06-16 DIAGNOSIS — K621 Rectal polyp: Secondary | ICD-10-CM | POA: Diagnosis not present

## 2015-06-16 DIAGNOSIS — K219 Gastro-esophageal reflux disease without esophagitis: Secondary | ICD-10-CM | POA: Diagnosis not present

## 2015-06-16 DIAGNOSIS — A047 Enterocolitis due to Clostridium difficile: Secondary | ICD-10-CM | POA: Diagnosis not present

## 2015-06-16 DIAGNOSIS — R262 Difficulty in walking, not elsewhere classified: Secondary | ICD-10-CM | POA: Diagnosis not present

## 2015-06-16 DIAGNOSIS — Z48816 Encounter for surgical aftercare following surgery on the genitourinary system: Secondary | ICD-10-CM | POA: Diagnosis not present

## 2015-06-16 DIAGNOSIS — R278 Other lack of coordination: Secondary | ICD-10-CM | POA: Diagnosis not present

## 2015-06-16 DIAGNOSIS — M6281 Muscle weakness (generalized): Secondary | ICD-10-CM | POA: Diagnosis not present

## 2015-06-17 DIAGNOSIS — R262 Difficulty in walking, not elsewhere classified: Secondary | ICD-10-CM | POA: Diagnosis not present

## 2015-06-17 DIAGNOSIS — R278 Other lack of coordination: Secondary | ICD-10-CM | POA: Diagnosis not present

## 2015-06-17 DIAGNOSIS — K621 Rectal polyp: Secondary | ICD-10-CM | POA: Diagnosis not present

## 2015-06-17 DIAGNOSIS — Z48816 Encounter for surgical aftercare following surgery on the genitourinary system: Secondary | ICD-10-CM | POA: Diagnosis not present

## 2015-06-17 DIAGNOSIS — A047 Enterocolitis due to Clostridium difficile: Secondary | ICD-10-CM | POA: Diagnosis not present

## 2015-06-17 DIAGNOSIS — M6281 Muscle weakness (generalized): Secondary | ICD-10-CM | POA: Diagnosis not present

## 2015-06-17 DIAGNOSIS — K219 Gastro-esophageal reflux disease without esophagitis: Secondary | ICD-10-CM | POA: Diagnosis not present

## 2015-06-17 NOTE — Patient Outreach (Signed)
[place] [facility]) Care Management  06/17/2015  Alicia Guerra 1924-05-13 [id number]   Referral from [facility] tier 4 list, assigned to Sherrin Daisy, RN for patient outreach.  Kym Fenter L. Torian Quintero, [place] Care Management Assistant

## 2015-06-18 ENCOUNTER — Other Ambulatory Visit: Payer: Self-pay | Admitting: *Deleted

## 2015-06-18 ENCOUNTER — Encounter: Payer: Self-pay | Admitting: *Deleted

## 2015-06-18 DIAGNOSIS — K621 Rectal polyp: Secondary | ICD-10-CM | POA: Diagnosis not present

## 2015-06-18 DIAGNOSIS — M6281 Muscle weakness (generalized): Secondary | ICD-10-CM | POA: Diagnosis not present

## 2015-06-18 DIAGNOSIS — R262 Difficulty in walking, not elsewhere classified: Secondary | ICD-10-CM | POA: Diagnosis not present

## 2015-06-18 DIAGNOSIS — R278 Other lack of coordination: Secondary | ICD-10-CM | POA: Diagnosis not present

## 2015-06-18 DIAGNOSIS — Z48816 Encounter for surgical aftercare following surgery on the genitourinary system: Secondary | ICD-10-CM | POA: Diagnosis not present

## 2015-06-18 DIAGNOSIS — K219 Gastro-esophageal reflux disease without esophagitis: Secondary | ICD-10-CM | POA: Diagnosis not present

## 2015-06-18 DIAGNOSIS — A047 Enterocolitis due to Clostridium difficile: Secondary | ICD-10-CM | POA: Diagnosis not present

## 2015-06-18 NOTE — Patient Outreach (Signed)
[place] [facility]) Care Management  06/18/2015  CHRISMA DAILY 02/10/1924 [id number]   Referral from [place] 4 list: Per notes on previous referral 04/17/2015 patient was long term resident at [facility].   Telephone call to patient: recording states phone has been disconnected. Telephone call to [facility] and receptionist advised  that patient is a resident of facility.  Spoke with Jacqlyn Larsen in MD office who advised that patient is a resident of [facility] [employer] term.  Plan: Will close case and send MD closure letter. Sherrin Daisy, RN BSN [place] Management Coordinator [facility] Care Management  [phone]

## 2015-06-19 DIAGNOSIS — K219 Gastro-esophageal reflux disease without esophagitis: Secondary | ICD-10-CM | POA: Diagnosis not present

## 2015-06-19 DIAGNOSIS — R278 Other lack of coordination: Secondary | ICD-10-CM | POA: Diagnosis not present

## 2015-06-19 DIAGNOSIS — K621 Rectal polyp: Secondary | ICD-10-CM | POA: Diagnosis not present

## 2015-06-19 DIAGNOSIS — M6281 Muscle weakness (generalized): Secondary | ICD-10-CM | POA: Diagnosis not present

## 2015-06-19 DIAGNOSIS — A047 Enterocolitis due to Clostridium difficile: Secondary | ICD-10-CM | POA: Diagnosis not present

## 2015-06-19 DIAGNOSIS — Z48816 Encounter for surgical aftercare following surgery on the genitourinary system: Secondary | ICD-10-CM | POA: Diagnosis not present

## 2015-06-19 DIAGNOSIS — R262 Difficulty in walking, not elsewhere classified: Secondary | ICD-10-CM | POA: Diagnosis not present

## 2015-06-21 DIAGNOSIS — C180 Malignant neoplasm of cecum: Secondary | ICD-10-CM | POA: Diagnosis not present

## 2015-06-22 DIAGNOSIS — R278 Other lack of coordination: Secondary | ICD-10-CM | POA: Diagnosis not present

## 2015-06-22 DIAGNOSIS — Z48816 Encounter for surgical aftercare following surgery on the genitourinary system: Secondary | ICD-10-CM | POA: Diagnosis not present

## 2015-06-22 DIAGNOSIS — K219 Gastro-esophageal reflux disease without esophagitis: Secondary | ICD-10-CM | POA: Diagnosis not present

## 2015-06-22 DIAGNOSIS — R262 Difficulty in walking, not elsewhere classified: Secondary | ICD-10-CM | POA: Diagnosis not present

## 2015-06-22 DIAGNOSIS — A047 Enterocolitis due to Clostridium difficile: Secondary | ICD-10-CM | POA: Diagnosis not present

## 2015-06-22 DIAGNOSIS — K621 Rectal polyp: Secondary | ICD-10-CM | POA: Diagnosis not present

## 2015-06-22 DIAGNOSIS — M6281 Muscle weakness (generalized): Secondary | ICD-10-CM | POA: Diagnosis not present

## 2015-06-22 NOTE — Patient Outreach (Signed)
[place] [facility]) Care Management  06/22/2015  Alicia Guerra 16-Dec-1923 [id number]   Notification received from Sherrin Daisy, RN to close case due to patient being assessed, no further intervention needed-resides in long term nursing home.  Cherrell Maybee L. Ilias Stcharles, [place] Care Management Assistant

## 2015-06-23 DIAGNOSIS — K621 Rectal polyp: Secondary | ICD-10-CM | POA: Diagnosis not present

## 2015-06-23 DIAGNOSIS — A047 Enterocolitis due to Clostridium difficile: Secondary | ICD-10-CM | POA: Diagnosis not present

## 2015-06-23 DIAGNOSIS — M6281 Muscle weakness (generalized): Secondary | ICD-10-CM | POA: Diagnosis not present

## 2015-06-23 DIAGNOSIS — R262 Difficulty in walking, not elsewhere classified: Secondary | ICD-10-CM | POA: Diagnosis not present

## 2015-06-23 DIAGNOSIS — R278 Other lack of coordination: Secondary | ICD-10-CM | POA: Diagnosis not present

## 2015-06-23 DIAGNOSIS — K219 Gastro-esophageal reflux disease without esophagitis: Secondary | ICD-10-CM | POA: Diagnosis not present

## 2015-06-23 DIAGNOSIS — Z48816 Encounter for surgical aftercare following surgery on the genitourinary system: Secondary | ICD-10-CM | POA: Diagnosis not present

## 2015-06-24 DIAGNOSIS — K621 Rectal polyp: Secondary | ICD-10-CM | POA: Diagnosis not present

## 2015-06-24 DIAGNOSIS — M6281 Muscle weakness (generalized): Secondary | ICD-10-CM | POA: Diagnosis not present

## 2015-06-24 DIAGNOSIS — A047 Enterocolitis due to Clostridium difficile: Secondary | ICD-10-CM | POA: Diagnosis not present

## 2015-06-24 DIAGNOSIS — R278 Other lack of coordination: Secondary | ICD-10-CM | POA: Diagnosis not present

## 2015-06-24 DIAGNOSIS — Z48816 Encounter for surgical aftercare following surgery on the genitourinary system: Secondary | ICD-10-CM | POA: Diagnosis not present

## 2015-06-24 DIAGNOSIS — R262 Difficulty in walking, not elsewhere classified: Secondary | ICD-10-CM | POA: Diagnosis not present

## 2015-06-24 DIAGNOSIS — K219 Gastro-esophageal reflux disease without esophagitis: Secondary | ICD-10-CM | POA: Diagnosis not present

## 2015-06-24 NOTE — Progress Notes (Signed)
This is documentation of my visit at the skilled care facility for 06/21/2015. She says she feels okay. She has no new complaints. She is a little confused.  She is awake and alert. She is sitting in a wheelchair. Her chest is clear. Her heart is regular. Her abdomen is soft and her surgical scar is healing she is mildly confused she is still very thin and has been doing better with her nutrition  She has had colon cancer which has been resected and she's had re-anastomosis. She is doing well with all of that. She had GI bleeding from that which has now resulted. She's had atrial fibrillation which is stable previous fracture of her femur which is stable but she still having trouble ambulating severe protein calorie malnutrition which seems to be getting better. She has dementia as well  No change in treatments she does seem to be improving

## 2015-06-25 DIAGNOSIS — M6281 Muscle weakness (generalized): Secondary | ICD-10-CM | POA: Diagnosis not present

## 2015-06-25 DIAGNOSIS — K621 Rectal polyp: Secondary | ICD-10-CM | POA: Diagnosis not present

## 2015-06-25 DIAGNOSIS — A047 Enterocolitis due to Clostridium difficile: Secondary | ICD-10-CM | POA: Diagnosis not present

## 2015-06-25 DIAGNOSIS — Z48816 Encounter for surgical aftercare following surgery on the genitourinary system: Secondary | ICD-10-CM | POA: Diagnosis not present

## 2015-06-25 DIAGNOSIS — K219 Gastro-esophageal reflux disease without esophagitis: Secondary | ICD-10-CM | POA: Diagnosis not present

## 2015-06-25 DIAGNOSIS — R278 Other lack of coordination: Secondary | ICD-10-CM | POA: Diagnosis not present

## 2015-06-25 DIAGNOSIS — R262 Difficulty in walking, not elsewhere classified: Secondary | ICD-10-CM | POA: Diagnosis not present

## 2015-06-26 DIAGNOSIS — K621 Rectal polyp: Secondary | ICD-10-CM | POA: Diagnosis not present

## 2015-06-26 DIAGNOSIS — R262 Difficulty in walking, not elsewhere classified: Secondary | ICD-10-CM | POA: Diagnosis not present

## 2015-06-26 DIAGNOSIS — K219 Gastro-esophageal reflux disease without esophagitis: Secondary | ICD-10-CM | POA: Diagnosis not present

## 2015-06-26 DIAGNOSIS — A047 Enterocolitis due to Clostridium difficile: Secondary | ICD-10-CM | POA: Diagnosis not present

## 2015-06-26 DIAGNOSIS — R278 Other lack of coordination: Secondary | ICD-10-CM | POA: Diagnosis not present

## 2015-06-26 DIAGNOSIS — M6281 Muscle weakness (generalized): Secondary | ICD-10-CM | POA: Diagnosis not present

## 2015-06-26 DIAGNOSIS — Z48816 Encounter for surgical aftercare following surgery on the genitourinary system: Secondary | ICD-10-CM | POA: Diagnosis not present

## 2015-06-29 DIAGNOSIS — A047 Enterocolitis due to Clostridium difficile: Secondary | ICD-10-CM | POA: Diagnosis not present

## 2015-06-29 DIAGNOSIS — Z48816 Encounter for surgical aftercare following surgery on the genitourinary system: Secondary | ICD-10-CM | POA: Diagnosis not present

## 2015-06-29 DIAGNOSIS — K621 Rectal polyp: Secondary | ICD-10-CM | POA: Diagnosis not present

## 2015-06-29 DIAGNOSIS — K219 Gastro-esophageal reflux disease without esophagitis: Secondary | ICD-10-CM | POA: Diagnosis not present

## 2015-06-29 DIAGNOSIS — R278 Other lack of coordination: Secondary | ICD-10-CM | POA: Diagnosis not present

## 2015-06-29 DIAGNOSIS — R262 Difficulty in walking, not elsewhere classified: Secondary | ICD-10-CM | POA: Diagnosis not present

## 2015-06-29 DIAGNOSIS — M6281 Muscle weakness (generalized): Secondary | ICD-10-CM | POA: Diagnosis not present

## 2015-06-30 DIAGNOSIS — M6281 Muscle weakness (generalized): Secondary | ICD-10-CM | POA: Diagnosis not present

## 2015-06-30 DIAGNOSIS — R278 Other lack of coordination: Secondary | ICD-10-CM | POA: Diagnosis not present

## 2015-06-30 DIAGNOSIS — Z48816 Encounter for surgical aftercare following surgery on the genitourinary system: Secondary | ICD-10-CM | POA: Diagnosis not present

## 2015-06-30 DIAGNOSIS — K621 Rectal polyp: Secondary | ICD-10-CM | POA: Diagnosis not present

## 2015-06-30 DIAGNOSIS — R262 Difficulty in walking, not elsewhere classified: Secondary | ICD-10-CM | POA: Diagnosis not present

## 2015-06-30 DIAGNOSIS — A047 Enterocolitis due to Clostridium difficile: Secondary | ICD-10-CM | POA: Diagnosis not present

## 2015-06-30 DIAGNOSIS — K219 Gastro-esophageal reflux disease without esophagitis: Secondary | ICD-10-CM | POA: Diagnosis not present

## 2015-07-01 DIAGNOSIS — K621 Rectal polyp: Secondary | ICD-10-CM | POA: Diagnosis not present

## 2015-07-01 DIAGNOSIS — M6281 Muscle weakness (generalized): Secondary | ICD-10-CM | POA: Diagnosis not present

## 2015-07-01 DIAGNOSIS — K219 Gastro-esophageal reflux disease without esophagitis: Secondary | ICD-10-CM | POA: Diagnosis not present

## 2015-07-01 DIAGNOSIS — R262 Difficulty in walking, not elsewhere classified: Secondary | ICD-10-CM | POA: Diagnosis not present

## 2015-07-01 DIAGNOSIS — Z48816 Encounter for surgical aftercare following surgery on the genitourinary system: Secondary | ICD-10-CM | POA: Diagnosis not present

## 2015-07-01 DIAGNOSIS — A047 Enterocolitis due to Clostridium difficile: Secondary | ICD-10-CM | POA: Diagnosis not present

## 2015-07-01 DIAGNOSIS — R278 Other lack of coordination: Secondary | ICD-10-CM | POA: Diagnosis not present

## 2015-07-02 DIAGNOSIS — Z48816 Encounter for surgical aftercare following surgery on the genitourinary system: Secondary | ICD-10-CM | POA: Diagnosis not present

## 2015-07-02 DIAGNOSIS — R262 Difficulty in walking, not elsewhere classified: Secondary | ICD-10-CM | POA: Diagnosis not present

## 2015-07-02 DIAGNOSIS — M6281 Muscle weakness (generalized): Secondary | ICD-10-CM | POA: Diagnosis not present

## 2015-07-02 DIAGNOSIS — R278 Other lack of coordination: Secondary | ICD-10-CM | POA: Diagnosis not present

## 2015-07-02 DIAGNOSIS — K219 Gastro-esophageal reflux disease without esophagitis: Secondary | ICD-10-CM | POA: Diagnosis not present

## 2015-07-02 DIAGNOSIS — K621 Rectal polyp: Secondary | ICD-10-CM | POA: Diagnosis not present

## 2015-07-02 DIAGNOSIS — A047 Enterocolitis due to Clostridium difficile: Secondary | ICD-10-CM | POA: Diagnosis not present

## 2015-07-03 DIAGNOSIS — A047 Enterocolitis due to Clostridium difficile: Secondary | ICD-10-CM | POA: Diagnosis not present

## 2015-07-03 DIAGNOSIS — R278 Other lack of coordination: Secondary | ICD-10-CM | POA: Diagnosis not present

## 2015-07-03 DIAGNOSIS — K219 Gastro-esophageal reflux disease without esophagitis: Secondary | ICD-10-CM | POA: Diagnosis not present

## 2015-07-03 DIAGNOSIS — M6281 Muscle weakness (generalized): Secondary | ICD-10-CM | POA: Diagnosis not present

## 2015-07-03 DIAGNOSIS — Z48816 Encounter for surgical aftercare following surgery on the genitourinary system: Secondary | ICD-10-CM | POA: Diagnosis not present

## 2015-07-03 DIAGNOSIS — K621 Rectal polyp: Secondary | ICD-10-CM | POA: Diagnosis not present

## 2015-07-03 DIAGNOSIS — R262 Difficulty in walking, not elsewhere classified: Secondary | ICD-10-CM | POA: Diagnosis not present

## 2015-07-06 DIAGNOSIS — K621 Rectal polyp: Secondary | ICD-10-CM | POA: Diagnosis not present

## 2015-07-06 DIAGNOSIS — M6281 Muscle weakness (generalized): Secondary | ICD-10-CM | POA: Diagnosis not present

## 2015-07-06 DIAGNOSIS — R278 Other lack of coordination: Secondary | ICD-10-CM | POA: Diagnosis not present

## 2015-07-06 DIAGNOSIS — A047 Enterocolitis due to Clostridium difficile: Secondary | ICD-10-CM | POA: Diagnosis not present

## 2015-07-06 DIAGNOSIS — R262 Difficulty in walking, not elsewhere classified: Secondary | ICD-10-CM | POA: Diagnosis not present

## 2015-07-06 DIAGNOSIS — K219 Gastro-esophageal reflux disease without esophagitis: Secondary | ICD-10-CM | POA: Diagnosis not present

## 2015-07-06 DIAGNOSIS — Z48816 Encounter for surgical aftercare following surgery on the genitourinary system: Secondary | ICD-10-CM | POA: Diagnosis not present

## 2015-07-07 DIAGNOSIS — A047 Enterocolitis due to Clostridium difficile: Secondary | ICD-10-CM | POA: Diagnosis not present

## 2015-07-07 DIAGNOSIS — M6281 Muscle weakness (generalized): Secondary | ICD-10-CM | POA: Diagnosis not present

## 2015-07-07 DIAGNOSIS — K219 Gastro-esophageal reflux disease without esophagitis: Secondary | ICD-10-CM | POA: Diagnosis not present

## 2015-07-07 DIAGNOSIS — R262 Difficulty in walking, not elsewhere classified: Secondary | ICD-10-CM | POA: Diagnosis not present

## 2015-07-07 DIAGNOSIS — R278 Other lack of coordination: Secondary | ICD-10-CM | POA: Diagnosis not present

## 2015-07-07 DIAGNOSIS — Z48816 Encounter for surgical aftercare following surgery on the genitourinary system: Secondary | ICD-10-CM | POA: Diagnosis not present

## 2015-07-08 DIAGNOSIS — Z48816 Encounter for surgical aftercare following surgery on the genitourinary system: Secondary | ICD-10-CM | POA: Diagnosis not present

## 2015-07-08 DIAGNOSIS — A047 Enterocolitis due to Clostridium difficile: Secondary | ICD-10-CM | POA: Diagnosis not present

## 2015-07-08 DIAGNOSIS — K219 Gastro-esophageal reflux disease without esophagitis: Secondary | ICD-10-CM | POA: Diagnosis not present

## 2015-07-08 DIAGNOSIS — R278 Other lack of coordination: Secondary | ICD-10-CM | POA: Diagnosis not present

## 2015-07-08 DIAGNOSIS — M6281 Muscle weakness (generalized): Secondary | ICD-10-CM | POA: Diagnosis not present

## 2015-07-08 DIAGNOSIS — R262 Difficulty in walking, not elsewhere classified: Secondary | ICD-10-CM | POA: Diagnosis not present

## 2015-07-09 DIAGNOSIS — K219 Gastro-esophageal reflux disease without esophagitis: Secondary | ICD-10-CM | POA: Diagnosis not present

## 2015-07-09 DIAGNOSIS — Z48816 Encounter for surgical aftercare following surgery on the genitourinary system: Secondary | ICD-10-CM | POA: Diagnosis not present

## 2015-07-09 DIAGNOSIS — R262 Difficulty in walking, not elsewhere classified: Secondary | ICD-10-CM | POA: Diagnosis not present

## 2015-07-09 DIAGNOSIS — M6281 Muscle weakness (generalized): Secondary | ICD-10-CM | POA: Diagnosis not present

## 2015-07-09 DIAGNOSIS — R278 Other lack of coordination: Secondary | ICD-10-CM | POA: Diagnosis not present

## 2015-07-09 DIAGNOSIS — A047 Enterocolitis due to Clostridium difficile: Secondary | ICD-10-CM | POA: Diagnosis not present

## 2015-07-10 DIAGNOSIS — K219 Gastro-esophageal reflux disease without esophagitis: Secondary | ICD-10-CM | POA: Diagnosis not present

## 2015-07-10 DIAGNOSIS — M6281 Muscle weakness (generalized): Secondary | ICD-10-CM | POA: Diagnosis not present

## 2015-07-10 DIAGNOSIS — Z48816 Encounter for surgical aftercare following surgery on the genitourinary system: Secondary | ICD-10-CM | POA: Diagnosis not present

## 2015-07-10 DIAGNOSIS — A047 Enterocolitis due to Clostridium difficile: Secondary | ICD-10-CM | POA: Diagnosis not present

## 2015-07-10 DIAGNOSIS — R278 Other lack of coordination: Secondary | ICD-10-CM | POA: Diagnosis not present

## 2015-07-10 DIAGNOSIS — R262 Difficulty in walking, not elsewhere classified: Secondary | ICD-10-CM | POA: Diagnosis not present

## 2015-07-13 DIAGNOSIS — A047 Enterocolitis due to Clostridium difficile: Secondary | ICD-10-CM | POA: Diagnosis not present

## 2015-07-13 DIAGNOSIS — Z48816 Encounter for surgical aftercare following surgery on the genitourinary system: Secondary | ICD-10-CM | POA: Diagnosis not present

## 2015-07-13 DIAGNOSIS — R262 Difficulty in walking, not elsewhere classified: Secondary | ICD-10-CM | POA: Diagnosis not present

## 2015-07-13 DIAGNOSIS — K219 Gastro-esophageal reflux disease without esophagitis: Secondary | ICD-10-CM | POA: Diagnosis not present

## 2015-07-13 DIAGNOSIS — R278 Other lack of coordination: Secondary | ICD-10-CM | POA: Diagnosis not present

## 2015-07-13 DIAGNOSIS — M6281 Muscle weakness (generalized): Secondary | ICD-10-CM | POA: Diagnosis not present

## 2015-07-14 DIAGNOSIS — K219 Gastro-esophageal reflux disease without esophagitis: Secondary | ICD-10-CM | POA: Diagnosis not present

## 2015-07-14 DIAGNOSIS — R262 Difficulty in walking, not elsewhere classified: Secondary | ICD-10-CM | POA: Diagnosis not present

## 2015-07-14 DIAGNOSIS — A047 Enterocolitis due to Clostridium difficile: Secondary | ICD-10-CM | POA: Diagnosis not present

## 2015-07-14 DIAGNOSIS — Z48816 Encounter for surgical aftercare following surgery on the genitourinary system: Secondary | ICD-10-CM | POA: Diagnosis not present

## 2015-07-14 DIAGNOSIS — R278 Other lack of coordination: Secondary | ICD-10-CM | POA: Diagnosis not present

## 2015-07-14 DIAGNOSIS — M6281 Muscle weakness (generalized): Secondary | ICD-10-CM | POA: Diagnosis not present

## 2015-07-15 DIAGNOSIS — K219 Gastro-esophageal reflux disease without esophagitis: Secondary | ICD-10-CM | POA: Diagnosis not present

## 2015-07-15 DIAGNOSIS — Z48816 Encounter for surgical aftercare following surgery on the genitourinary system: Secondary | ICD-10-CM | POA: Diagnosis not present

## 2015-07-15 DIAGNOSIS — M6281 Muscle weakness (generalized): Secondary | ICD-10-CM | POA: Diagnosis not present

## 2015-07-15 DIAGNOSIS — R262 Difficulty in walking, not elsewhere classified: Secondary | ICD-10-CM | POA: Diagnosis not present

## 2015-07-15 DIAGNOSIS — A047 Enterocolitis due to Clostridium difficile: Secondary | ICD-10-CM | POA: Diagnosis not present

## 2015-07-15 DIAGNOSIS — R278 Other lack of coordination: Secondary | ICD-10-CM | POA: Diagnosis not present

## 2015-07-16 DIAGNOSIS — R262 Difficulty in walking, not elsewhere classified: Secondary | ICD-10-CM | POA: Diagnosis not present

## 2015-07-16 DIAGNOSIS — M6281 Muscle weakness (generalized): Secondary | ICD-10-CM | POA: Diagnosis not present

## 2015-07-16 DIAGNOSIS — R278 Other lack of coordination: Secondary | ICD-10-CM | POA: Diagnosis not present

## 2015-07-16 DIAGNOSIS — Z48816 Encounter for surgical aftercare following surgery on the genitourinary system: Secondary | ICD-10-CM | POA: Diagnosis not present

## 2015-07-16 DIAGNOSIS — K219 Gastro-esophageal reflux disease without esophagitis: Secondary | ICD-10-CM | POA: Diagnosis not present

## 2015-07-16 DIAGNOSIS — A047 Enterocolitis due to Clostridium difficile: Secondary | ICD-10-CM | POA: Diagnosis not present

## 2015-07-17 DIAGNOSIS — Z48816 Encounter for surgical aftercare following surgery on the genitourinary system: Secondary | ICD-10-CM | POA: Diagnosis not present

## 2015-07-17 DIAGNOSIS — M6281 Muscle weakness (generalized): Secondary | ICD-10-CM | POA: Diagnosis not present

## 2015-07-17 DIAGNOSIS — R262 Difficulty in walking, not elsewhere classified: Secondary | ICD-10-CM | POA: Diagnosis not present

## 2015-07-17 DIAGNOSIS — A047 Enterocolitis due to Clostridium difficile: Secondary | ICD-10-CM | POA: Diagnosis not present

## 2015-07-17 DIAGNOSIS — R278 Other lack of coordination: Secondary | ICD-10-CM | POA: Diagnosis not present

## 2015-07-17 DIAGNOSIS — K219 Gastro-esophageal reflux disease without esophagitis: Secondary | ICD-10-CM | POA: Diagnosis not present

## 2015-07-20 DIAGNOSIS — R278 Other lack of coordination: Secondary | ICD-10-CM | POA: Diagnosis not present

## 2015-07-20 DIAGNOSIS — M79671 Pain in right foot: Secondary | ICD-10-CM | POA: Diagnosis not present

## 2015-07-20 DIAGNOSIS — K219 Gastro-esophageal reflux disease without esophagitis: Secondary | ICD-10-CM | POA: Diagnosis not present

## 2015-07-20 DIAGNOSIS — B351 Tinea unguium: Secondary | ICD-10-CM | POA: Diagnosis not present

## 2015-07-20 DIAGNOSIS — A047 Enterocolitis due to Clostridium difficile: Secondary | ICD-10-CM | POA: Diagnosis not present

## 2015-07-20 DIAGNOSIS — Z48816 Encounter for surgical aftercare following surgery on the genitourinary system: Secondary | ICD-10-CM | POA: Diagnosis not present

## 2015-07-20 DIAGNOSIS — R262 Difficulty in walking, not elsewhere classified: Secondary | ICD-10-CM | POA: Diagnosis not present

## 2015-07-20 DIAGNOSIS — M6281 Muscle weakness (generalized): Secondary | ICD-10-CM | POA: Diagnosis not present

## 2015-07-20 DIAGNOSIS — M79672 Pain in left foot: Secondary | ICD-10-CM | POA: Diagnosis not present

## 2015-07-21 DIAGNOSIS — A047 Enterocolitis due to Clostridium difficile: Secondary | ICD-10-CM | POA: Diagnosis not present

## 2015-07-21 DIAGNOSIS — K219 Gastro-esophageal reflux disease without esophagitis: Secondary | ICD-10-CM | POA: Diagnosis not present

## 2015-07-21 DIAGNOSIS — R262 Difficulty in walking, not elsewhere classified: Secondary | ICD-10-CM | POA: Diagnosis not present

## 2015-07-21 DIAGNOSIS — M6281 Muscle weakness (generalized): Secondary | ICD-10-CM | POA: Diagnosis not present

## 2015-07-21 DIAGNOSIS — R278 Other lack of coordination: Secondary | ICD-10-CM | POA: Diagnosis not present

## 2015-07-21 DIAGNOSIS — Z48816 Encounter for surgical aftercare following surgery on the genitourinary system: Secondary | ICD-10-CM | POA: Diagnosis not present

## 2015-07-22 DIAGNOSIS — Z48816 Encounter for surgical aftercare following surgery on the genitourinary system: Secondary | ICD-10-CM | POA: Diagnosis not present

## 2015-07-22 DIAGNOSIS — R278 Other lack of coordination: Secondary | ICD-10-CM | POA: Diagnosis not present

## 2015-07-22 DIAGNOSIS — M6281 Muscle weakness (generalized): Secondary | ICD-10-CM | POA: Diagnosis not present

## 2015-07-22 DIAGNOSIS — K219 Gastro-esophageal reflux disease without esophagitis: Secondary | ICD-10-CM | POA: Diagnosis not present

## 2015-07-22 DIAGNOSIS — A047 Enterocolitis due to Clostridium difficile: Secondary | ICD-10-CM | POA: Diagnosis not present

## 2015-07-22 DIAGNOSIS — R262 Difficulty in walking, not elsewhere classified: Secondary | ICD-10-CM | POA: Diagnosis not present

## 2015-07-23 DIAGNOSIS — K219 Gastro-esophageal reflux disease without esophagitis: Secondary | ICD-10-CM | POA: Diagnosis not present

## 2015-07-23 DIAGNOSIS — M6281 Muscle weakness (generalized): Secondary | ICD-10-CM | POA: Diagnosis not present

## 2015-07-23 DIAGNOSIS — R262 Difficulty in walking, not elsewhere classified: Secondary | ICD-10-CM | POA: Diagnosis not present

## 2015-07-23 DIAGNOSIS — R278 Other lack of coordination: Secondary | ICD-10-CM | POA: Diagnosis not present

## 2015-07-23 DIAGNOSIS — Z48816 Encounter for surgical aftercare following surgery on the genitourinary system: Secondary | ICD-10-CM | POA: Diagnosis not present

## 2015-07-23 DIAGNOSIS — A047 Enterocolitis due to Clostridium difficile: Secondary | ICD-10-CM | POA: Diagnosis not present

## 2015-07-26 DIAGNOSIS — C180 Malignant neoplasm of cecum: Secondary | ICD-10-CM | POA: Diagnosis not present

## 2015-07-27 NOTE — Progress Notes (Signed)
This is documentation of my visit of 07/26/2015 at the skilled care facility. She is awake and alert and has no particular complaints. She has done well after her reanastomosis of her colon. She is apparently eating pretty well. She still has pain in her leg. She is confused.  She's sitting in a wheelchair. She is awake and alert but not oriented. Her chest is clear. Her heart is regular. Her abdomen is soft  She has had multiple medical problems including colon cancer, fracture of the femur, dementia, protein calorie malnutrition which I think is getting better.  No change in treatments. I think she is doing fairly well

## 2015-07-28 ENCOUNTER — Encounter (HOSPITAL_COMMUNITY)
Admission: AD | Admit: 2015-07-28 | Discharge: 2015-07-28 | Disposition: A | Discharge status: Home / Self Care | Payer: Commercial Managed Care - HMO | Source: Skilled Nursing Facility | Attending: Internal Medicine | Admitting: Internal Medicine

## 2015-07-28 DIAGNOSIS — N189 Chronic kidney disease, unspecified: Secondary | ICD-10-CM | POA: Insufficient documentation

## 2015-07-28 LAB — CBC WITH DIFFERENTIAL/PLATELET
BASOS ABS: 0 10*3/uL (ref 0.0–0.1)
Basophils Relative: 1 %
Eosinophils Absolute: 0.2 10*3/uL (ref 0.0–0.7)
Eosinophils Relative: 4 %
HEMATOCRIT: 29.8 % — AB (ref 36.0–46.0)
Hemoglobin: 9.3 g/dL — ABNORMAL LOW (ref 12.0–15.0)
LYMPHS PCT: 39 %
Lymphs Abs: 2.1 10*3/uL (ref 0.7–4.0)
MCH: 26.2 pg (ref 26.0–34.0)
MCHC: 31.2 g/dL (ref 30.0–36.0)
MCV: 83.9 fL (ref 78.0–100.0)
MONO ABS: 0.5 10*3/uL (ref 0.1–1.0)
MONOS PCT: 9 %
NEUTROS ABS: 2.6 10*3/uL (ref 1.7–7.7)
Neutrophils Relative %: 47 %
Platelets: 285 10*3/uL (ref 150–400)
RBC: 3.55 MIL/uL — ABNORMAL LOW (ref 3.87–5.11)
RDW: 13.9 % (ref 11.5–15.5)
WBC: 5.5 10*3/uL (ref 4.0–10.5)

## 2015-08-09 DIAGNOSIS — C180 Malignant neoplasm of cecum: Secondary | ICD-10-CM | POA: Diagnosis not present

## 2015-08-11 ENCOUNTER — Encounter (HOSPITAL_COMMUNITY)
Admission: AD | Admit: 2015-08-11 | Discharge: 2015-08-11 | Disposition: A | Discharge status: Home / Self Care | Payer: Commercial Managed Care - HMO | Source: Skilled Nursing Facility | Attending: Pulmonary Disease | Admitting: Pulmonary Disease

## 2015-08-11 DIAGNOSIS — Z029 Encounter for administrative examinations, unspecified: Secondary | ICD-10-CM | POA: Diagnosis not present

## 2015-08-11 LAB — CBC
HCT: 33.7 % — ABNORMAL LOW (ref 36.0–46.0)
HEMOGLOBIN: 10.6 g/dL — AB (ref 12.0–15.0)
MCH: 26.6 pg (ref 26.0–34.0)
MCHC: 31.5 g/dL (ref 30.0–36.0)
MCV: 84.5 fL (ref 78.0–100.0)
PLATELETS: 353 10*3/uL (ref 150–400)
RBC: 3.99 MIL/uL (ref 3.87–5.11)
RDW: 14.1 % (ref 11.5–15.5)
WBC: 6.4 10*3/uL (ref 4.0–10.5)

## 2015-08-13 NOTE — Progress Notes (Signed)
This is documentation of my visit at the skilled care facility of 08/09/2015. She has no complaints today. She feels okay. She is still confused  Exam shows she is awake and alert. She is in no distress. Her abdomen is soft. Her chest is clear. She is in a wheelchair. She is confused.  She had colon cancer in his had several surgeries and is tolerating all that so far. She had acute blood loss anemia and she will have CBC to see if she needs to stay on iron now that she has had her surgery is completed. She had a fracture in her femur which is stable. She has baseline some dementia which is unchanged  Recheck CBC no other changes.

## 2015-09-13 DIAGNOSIS — C180 Malignant neoplasm of cecum: Secondary | ICD-10-CM | POA: Diagnosis not present

## 2015-09-28 DIAGNOSIS — M79671 Pain in right foot: Secondary | ICD-10-CM | POA: Diagnosis not present

## 2015-09-28 DIAGNOSIS — B351 Tinea unguium: Secondary | ICD-10-CM | POA: Diagnosis not present

## 2015-09-28 DIAGNOSIS — M79672 Pain in left foot: Secondary | ICD-10-CM | POA: Diagnosis not present

## 2015-10-25 DIAGNOSIS — C180 Malignant neoplasm of cecum: Secondary | ICD-10-CM | POA: Diagnosis not present

## 2015-11-02 NOTE — Progress Notes (Signed)
This is documentation of my visit of 10/25/2015 at the skilled care facility. She says she's feeling well. She has no complaints. She is somewhat confused as always.  She's sitting in a wheelchair. She is in the community room. Her pupils are reactive nodes and third are clear mucous membranes are moist her chest is clear. Her heart is regular. Abdomen is soft surgery site is well-healed she has no edema  She has multiple problems including chronic atrial fib but she seems to be in sinus by exam. She's had fracture of the femur protein calorie malnutrition colon Cancer and has dementia.  No change in treatment.

## 2015-11-12 DIAGNOSIS — K219 Gastro-esophageal reflux disease without esophagitis: Secondary | ICD-10-CM | POA: Diagnosis not present

## 2015-11-12 DIAGNOSIS — Z48816 Encounter for surgical aftercare following surgery on the genitourinary system: Secondary | ICD-10-CM | POA: Diagnosis not present

## 2015-11-12 DIAGNOSIS — R1312 Dysphagia, oropharyngeal phase: Secondary | ICD-10-CM | POA: Diagnosis not present

## 2015-11-13 DIAGNOSIS — Z48816 Encounter for surgical aftercare following surgery on the genitourinary system: Secondary | ICD-10-CM | POA: Diagnosis not present

## 2015-11-13 DIAGNOSIS — K219 Gastro-esophageal reflux disease without esophagitis: Secondary | ICD-10-CM | POA: Diagnosis not present

## 2015-11-13 DIAGNOSIS — R1312 Dysphagia, oropharyngeal phase: Secondary | ICD-10-CM | POA: Diagnosis not present

## 2015-11-14 DIAGNOSIS — C180 Malignant neoplasm of cecum: Secondary | ICD-10-CM | POA: Diagnosis not present

## 2015-11-14 DIAGNOSIS — I4891 Unspecified atrial fibrillation: Secondary | ICD-10-CM | POA: Diagnosis not present

## 2015-11-14 DIAGNOSIS — F0390 Unspecified dementia without behavioral disturbance: Secondary | ICD-10-CM | POA: Diagnosis not present

## 2015-11-16 DIAGNOSIS — R1312 Dysphagia, oropharyngeal phase: Secondary | ICD-10-CM | POA: Diagnosis not present

## 2015-11-16 DIAGNOSIS — K219 Gastro-esophageal reflux disease without esophagitis: Secondary | ICD-10-CM | POA: Diagnosis not present

## 2015-11-16 DIAGNOSIS — Z48816 Encounter for surgical aftercare following surgery on the genitourinary system: Secondary | ICD-10-CM | POA: Diagnosis not present

## 2015-11-16 NOTE — Progress Notes (Signed)
This is documentation of my visit of 11/14/2015 at the skilled care facility. She has no complaints. She is confused as always. No complaints of abdominal pain.  She is sitting in a wheelchair. She is mildly confused. Her chest is clear. Heart is irregular. Her abdomen is soft. Surgical sites are well-healed.  She had a fall and fractured her femur. At that same time she was found to be anemic and found to have colon cancer. She initially underwent diverting colostomy but now has been reanastomosed. She is doing well. She has dementia of atrial fibrillation and had increased problems with protein calorie malnutrition which seems to be improving.  No change in treatments

## 2015-11-17 DIAGNOSIS — R1312 Dysphagia, oropharyngeal phase: Secondary | ICD-10-CM | POA: Diagnosis not present

## 2015-11-17 DIAGNOSIS — Z48816 Encounter for surgical aftercare following surgery on the genitourinary system: Secondary | ICD-10-CM | POA: Diagnosis not present

## 2015-11-17 DIAGNOSIS — K219 Gastro-esophageal reflux disease without esophagitis: Secondary | ICD-10-CM | POA: Diagnosis not present

## 2015-11-18 DIAGNOSIS — K219 Gastro-esophageal reflux disease without esophagitis: Secondary | ICD-10-CM | POA: Diagnosis not present

## 2015-11-18 DIAGNOSIS — Z48816 Encounter for surgical aftercare following surgery on the genitourinary system: Secondary | ICD-10-CM | POA: Diagnosis not present

## 2015-11-18 DIAGNOSIS — R1312 Dysphagia, oropharyngeal phase: Secondary | ICD-10-CM | POA: Diagnosis not present

## 2015-11-19 DIAGNOSIS — R1312 Dysphagia, oropharyngeal phase: Secondary | ICD-10-CM | POA: Diagnosis not present

## 2015-11-19 DIAGNOSIS — Z48816 Encounter for surgical aftercare following surgery on the genitourinary system: Secondary | ICD-10-CM | POA: Diagnosis not present

## 2015-11-19 DIAGNOSIS — K219 Gastro-esophageal reflux disease without esophagitis: Secondary | ICD-10-CM | POA: Diagnosis not present

## 2015-11-20 DIAGNOSIS — Z48816 Encounter for surgical aftercare following surgery on the genitourinary system: Secondary | ICD-10-CM | POA: Diagnosis not present

## 2015-11-20 DIAGNOSIS — K219 Gastro-esophageal reflux disease without esophagitis: Secondary | ICD-10-CM | POA: Diagnosis not present

## 2015-11-20 DIAGNOSIS — R1312 Dysphagia, oropharyngeal phase: Secondary | ICD-10-CM | POA: Diagnosis not present

## 2015-11-23 DIAGNOSIS — K219 Gastro-esophageal reflux disease without esophagitis: Secondary | ICD-10-CM | POA: Diagnosis not present

## 2015-11-23 DIAGNOSIS — R1312 Dysphagia, oropharyngeal phase: Secondary | ICD-10-CM | POA: Diagnosis not present

## 2015-11-23 DIAGNOSIS — Z48816 Encounter for surgical aftercare following surgery on the genitourinary system: Secondary | ICD-10-CM | POA: Diagnosis not present

## 2015-11-24 DIAGNOSIS — Z48816 Encounter for surgical aftercare following surgery on the genitourinary system: Secondary | ICD-10-CM | POA: Diagnosis not present

## 2015-11-24 DIAGNOSIS — K219 Gastro-esophageal reflux disease without esophagitis: Secondary | ICD-10-CM | POA: Diagnosis not present

## 2015-11-24 DIAGNOSIS — R1312 Dysphagia, oropharyngeal phase: Secondary | ICD-10-CM | POA: Diagnosis not present

## 2015-11-25 DIAGNOSIS — K219 Gastro-esophageal reflux disease without esophagitis: Secondary | ICD-10-CM | POA: Diagnosis not present

## 2015-11-25 DIAGNOSIS — Z48816 Encounter for surgical aftercare following surgery on the genitourinary system: Secondary | ICD-10-CM | POA: Diagnosis not present

## 2015-11-25 DIAGNOSIS — R1312 Dysphagia, oropharyngeal phase: Secondary | ICD-10-CM | POA: Diagnosis not present

## 2015-12-05 DIAGNOSIS — C180 Malignant neoplasm of cecum: Secondary | ICD-10-CM | POA: Diagnosis not present

## 2015-12-05 DIAGNOSIS — F0390 Unspecified dementia without behavioral disturbance: Secondary | ICD-10-CM | POA: Diagnosis not present

## 2015-12-05 DIAGNOSIS — I4891 Unspecified atrial fibrillation: Secondary | ICD-10-CM | POA: Diagnosis not present

## 2015-12-12 NOTE — Progress Notes (Signed)
This is documentation of my visit of 12/05/2015 at the skilled care facility. She says she feels okay. She is confused as always. No new problems have been noted  She is awake and alert. She is confused. Her chest is clear. She is in atrial fibrillation. Her abdomen is soft  She has multiple medical problems including chronic atrial fib, previous fracture of her femur, malignant neoplasm of the colon. She has had revision of her colostomy/ileostomy and no longer has a diversion. She has dementia which is unchanged  No change in treatments

## 2015-12-22 ENCOUNTER — Encounter (HOSPITAL_COMMUNITY)
Admission: RE | Admit: 2015-12-22 | Discharge: 2015-12-22 | Disposition: A | Discharge status: Home / Self Care | Payer: Commercial Managed Care - HMO | Source: Skilled Nursing Facility | Attending: Internal Medicine | Admitting: Internal Medicine

## 2015-12-22 DIAGNOSIS — J111 Influenza due to unidentified influenza virus with other respiratory manifestations: Secondary | ICD-10-CM | POA: Diagnosis not present

## 2015-12-22 LAB — INFLUENZA PANEL BY PCR (TYPE A & B)
H1N1 flu by pcr: NOT DETECTED
INFLAPCR: NEGATIVE
Influenza B By PCR: POSITIVE — AB

## 2016-01-09 DIAGNOSIS — C180 Malignant neoplasm of cecum: Secondary | ICD-10-CM | POA: Diagnosis not present

## 2016-01-09 DIAGNOSIS — F0390 Unspecified dementia without behavioral disturbance: Secondary | ICD-10-CM | POA: Diagnosis not present

## 2016-01-09 DIAGNOSIS — I4891 Unspecified atrial fibrillation: Secondary | ICD-10-CM | POA: Diagnosis not present

## 2016-01-11 NOTE — Progress Notes (Signed)
This is documentation of my visit of 01/09/2016. She is at the skilled care facility. She's had multiple problems including dementia atrial fibrillation protein calorie malnutrition colon cancer status post resection. She's doing okay. She is still confused. Her nutrition looks much better.  She is awake and alert and confused. I can't tell for sure she is in atrial fib are not. Her chest is clear. She does not have any edema. Her abdomen is soft  She has multiple medical problems as noted. No real change in her situation at this time. I will plan to continue treatments

## 2016-02-11 DIAGNOSIS — I4891 Unspecified atrial fibrillation: Secondary | ICD-10-CM | POA: Diagnosis not present

## 2016-02-11 DIAGNOSIS — F0390 Unspecified dementia without behavioral disturbance: Secondary | ICD-10-CM | POA: Diagnosis not present

## 2016-02-11 DIAGNOSIS — C180 Malignant neoplasm of cecum: Secondary | ICD-10-CM | POA: Diagnosis not present

## 2016-02-12 NOTE — Progress Notes (Signed)
This is documentation of my visit at the skilled care facility of 02/11/2016. She says she feels okay. She is confused as always. She does not know who I am. No new problems have been noted.  She's awake and alert and confused. Her chest is clear. Heart is mildly irregular. Her abdomen is soft surgical scar is well-healed  She has multiple medical problems including previous fracture of the femur, colon cancer status post resection and status post re-anastomosis, atrial fibrillation which is stable history of protein calorie malnutrition which I think is improving and dementia.  No change in treatments. She does seem to be improving.

## 2016-03-04 ENCOUNTER — Encounter (HOSPITAL_COMMUNITY)
Admission: RE | Admit: 2016-03-04 | Discharge: 2016-03-04 | Disposition: A | Discharge status: Home / Self Care | Payer: Commercial Managed Care - HMO | Source: Skilled Nursing Facility | Attending: Pulmonary Disease | Admitting: Pulmonary Disease

## 2016-03-04 DIAGNOSIS — I10 Essential (primary) hypertension: Secondary | ICD-10-CM | POA: Insufficient documentation

## 2016-03-04 DIAGNOSIS — F5089 Other specified eating disorder: Secondary | ICD-10-CM | POA: Insufficient documentation

## 2016-03-04 DIAGNOSIS — N189 Chronic kidney disease, unspecified: Secondary | ICD-10-CM | POA: Insufficient documentation

## 2016-03-04 DIAGNOSIS — F329 Major depressive disorder, single episode, unspecified: Secondary | ICD-10-CM | POA: Insufficient documentation

## 2016-03-04 DIAGNOSIS — K219 Gastro-esophageal reflux disease without esophagitis: Secondary | ICD-10-CM | POA: Diagnosis not present

## 2016-03-04 LAB — BASIC METABOLIC PANEL
Anion gap: 7 (ref 5–15)
BUN: 54 mg/dL — ABNORMAL HIGH (ref 6–20)
CO2: 22 mmol/L (ref 22–32)
CREATININE: 2.05 mg/dL — AB (ref 0.44–1.00)
Calcium: 8.9 mg/dL (ref 8.9–10.3)
Chloride: 109 mmol/L (ref 101–111)
GFR calc Af Amer: 23 mL/min — ABNORMAL LOW (ref >60)
GFR, EST NON AFRICAN AMERICAN: 20 mL/min — AB (ref >60)
GLUCOSE: 96 mg/dL (ref 65–99)
POTASSIUM: 4.6 mmol/L (ref 3.5–5.1)
SODIUM: 138 mmol/L (ref 135–145)

## 2016-03-04 LAB — CBC
HCT: 31.6 % — ABNORMAL LOW (ref 36.0–46.0)
Hemoglobin: 10 g/dL — ABNORMAL LOW (ref 12.0–15.0)
MCH: 26.4 pg (ref 26.0–34.0)
MCHC: 31.6 g/dL (ref 30.0–36.0)
MCV: 83.4 fL (ref 78.0–100.0)
Platelets: 323 10*3/uL (ref 150–400)
RBC: 3.79 MIL/uL — ABNORMAL LOW (ref 3.87–5.11)
RDW: 14.2 % (ref 11.5–15.5)
WBC: 6 10*3/uL (ref 4.0–10.5)

## 2016-03-07 ENCOUNTER — Encounter (HOSPITAL_COMMUNITY)
Admission: RE | Admit: 2016-03-07 | Discharge: 2016-03-07 | Disposition: A | Discharge status: Home / Self Care | Payer: Commercial Managed Care - HMO | Source: Skilled Nursing Facility | Attending: Pulmonary Disease | Admitting: Pulmonary Disease

## 2016-03-07 DIAGNOSIS — F329 Major depressive disorder, single episode, unspecified: Secondary | ICD-10-CM | POA: Insufficient documentation

## 2016-03-07 DIAGNOSIS — I10 Essential (primary) hypertension: Secondary | ICD-10-CM | POA: Insufficient documentation

## 2016-03-07 DIAGNOSIS — N189 Chronic kidney disease, unspecified: Secondary | ICD-10-CM | POA: Insufficient documentation

## 2016-03-07 DIAGNOSIS — K219 Gastro-esophageal reflux disease without esophagitis: Secondary | ICD-10-CM | POA: Diagnosis not present

## 2016-03-07 DIAGNOSIS — F5089 Other specified eating disorder: Secondary | ICD-10-CM | POA: Insufficient documentation

## 2016-03-07 LAB — CBC
HCT: 30.4 % — ABNORMAL LOW (ref 36.0–46.0)
Hemoglobin: 9.6 g/dL — ABNORMAL LOW (ref 12.0–15.0)
MCH: 26.7 pg (ref 26.0–34.0)
MCHC: 31.6 g/dL (ref 30.0–36.0)
MCV: 84.4 fL (ref 78.0–100.0)
PLATELETS: 287 10*3/uL (ref 150–400)
RBC: 3.6 MIL/uL — ABNORMAL LOW (ref 3.87–5.11)
RDW: 14.1 % (ref 11.5–15.5)
WBC: 6.9 10*3/uL (ref 4.0–10.5)

## 2016-03-07 LAB — BASIC METABOLIC PANEL
ANION GAP: 9 (ref 5–15)
BUN: 46 mg/dL — ABNORMAL HIGH (ref 6–20)
CALCIUM: 8.8 mg/dL — AB (ref 8.9–10.3)
CO2: 22 mmol/L (ref 22–32)
Chloride: 109 mmol/L (ref 101–111)
Creatinine, Ser: 1.95 mg/dL — ABNORMAL HIGH (ref 0.44–1.00)
GFR, EST AFRICAN AMERICAN: 25 mL/min — AB (ref >60)
GFR, EST NON AFRICAN AMERICAN: 21 mL/min — AB (ref >60)
Glucose, Bld: 96 mg/dL (ref 65–99)
Potassium: 4.7 mmol/L (ref 3.5–5.1)
Sodium: 140 mmol/L (ref 135–145)

## 2016-03-11 DIAGNOSIS — I4891 Unspecified atrial fibrillation: Secondary | ICD-10-CM | POA: Diagnosis not present

## 2016-03-11 DIAGNOSIS — C180 Malignant neoplasm of cecum: Secondary | ICD-10-CM | POA: Diagnosis not present

## 2016-03-11 DIAGNOSIS — F0390 Unspecified dementia without behavioral disturbance: Secondary | ICD-10-CM | POA: Diagnosis not present

## 2016-03-12 NOTE — Progress Notes (Signed)
This is documentation of my visit at the skilled care facility of 03/11/2016. She is awake and alert. Staff reports that she is complaining of problems with her her eye being irritated and there has been some pinkeye in the facility. She has no other new complaints but she has 3 severe dementia. She does not recognize me.  She is awake and alert. She does have what appears to be some conjunctivitis. Her chest is clear. Her heart is not regular. Abdomen is soft.  She has conjunctivitis. She has dementia which I think is stable. She's had a previous fracture of her femur colon cancer status post resection and reanastomosis and chronic atrial fib  She will start eyedrops. No other changes.

## 2016-03-14 DIAGNOSIS — M79672 Pain in left foot: Secondary | ICD-10-CM | POA: Diagnosis not present

## 2016-03-14 DIAGNOSIS — B351 Tinea unguium: Secondary | ICD-10-CM | POA: Diagnosis not present

## 2016-03-14 DIAGNOSIS — M79671 Pain in right foot: Secondary | ICD-10-CM | POA: Diagnosis not present

## 2016-04-16 DIAGNOSIS — C180 Malignant neoplasm of cecum: Secondary | ICD-10-CM | POA: Diagnosis not present

## 2016-04-16 DIAGNOSIS — F0390 Unspecified dementia without behavioral disturbance: Secondary | ICD-10-CM | POA: Diagnosis not present

## 2016-04-16 DIAGNOSIS — I4891 Unspecified atrial fibrillation: Secondary | ICD-10-CM | POA: Diagnosis not present

## 2016-04-16 NOTE — Progress Notes (Signed)
She is overall about the same. No new complaints. Her breathing is doing okay. She has not had any rapid atrial fib. She's eating well.  She is confused as always. Her chest is clear. Her heart pretty regular now I can tell by physical examination if she is in atrial fib or not. Her abdomen is soft.  She has multiple medical problems including atrial fibrillation previous fracture of her femur malignant colon cancer which has been resected and then she's had re-anastomosis failure to thrive and dementia. She is overall stable  No change in treatments

## 2016-05-13 DIAGNOSIS — M25562 Pain in left knee: Secondary | ICD-10-CM | POA: Diagnosis not present

## 2016-05-22 DIAGNOSIS — F0390 Unspecified dementia without behavioral disturbance: Secondary | ICD-10-CM | POA: Diagnosis not present

## 2016-05-22 DIAGNOSIS — C180 Malignant neoplasm of cecum: Secondary | ICD-10-CM | POA: Diagnosis not present

## 2016-05-22 DIAGNOSIS — I4891 Unspecified atrial fibrillation: Secondary | ICD-10-CM | POA: Diagnosis not present

## 2016-05-23 NOTE — Progress Notes (Signed)
This is documentation of my visit at the skilled care facility for 05/22/2016. She is awake and alert sitting in the community room. She does not know who I am. She has no complaints.  She is awake and alert. I think she is in atrial fib. Her chest is clear. Her heart is irregular. Abdomen is soft  She has multiple medical problems including chronic atrial fib previous fracture of the femur malignant neoplasm of the rectum and colon status post resection requiring colostomy which has now been revised. She has dementia. She has had problems with malnutrition and that is better.  Continue treatments

## 2016-06-13 DIAGNOSIS — I4891 Unspecified atrial fibrillation: Secondary | ICD-10-CM | POA: Diagnosis not present

## 2016-06-13 DIAGNOSIS — F0390 Unspecified dementia without behavioral disturbance: Secondary | ICD-10-CM | POA: Diagnosis not present

## 2016-06-13 DIAGNOSIS — C180 Malignant neoplasm of cecum: Secondary | ICD-10-CM | POA: Diagnosis not present

## 2016-06-13 NOTE — Progress Notes (Signed)
She is awake and alert but confused. She has no complaints.  She is awake and alert and confused. Heart shows atrial fib with good ventricular response. Her chest is clear. Her abdomen is soft. Surgical scars are well-healed she is sitting in a wheelchair  She had multiple problems. She has had colon cancer and resection and had a colostomy which has now been revised. She has pretty severe dementia and is confused. She has chronic atrial fib.  Continue treatments.

## 2016-06-14 DIAGNOSIS — H17822 Peripheral opacity of cornea, left eye: Secondary | ICD-10-CM | POA: Diagnosis not present

## 2016-06-14 DIAGNOSIS — H2521 Age-related cataract, morgagnian type, right eye: Secondary | ICD-10-CM | POA: Diagnosis not present

## 2016-06-14 DIAGNOSIS — Z961 Presence of intraocular lens: Secondary | ICD-10-CM | POA: Diagnosis not present

## 2016-06-14 DIAGNOSIS — H04123 Dry eye syndrome of bilateral lacrimal glands: Secondary | ICD-10-CM | POA: Diagnosis not present

## 2016-07-06 IMAGING — CR DG CHEST 1V PORT
1 series · 1 of 1 positions shown · non-contrast
Comparison: 08/05/2014.

CLINICAL DATA: Fever.  Subsequent encounter.

EXAM:
PORTABLE CHEST - 1 VIEW

[AP]
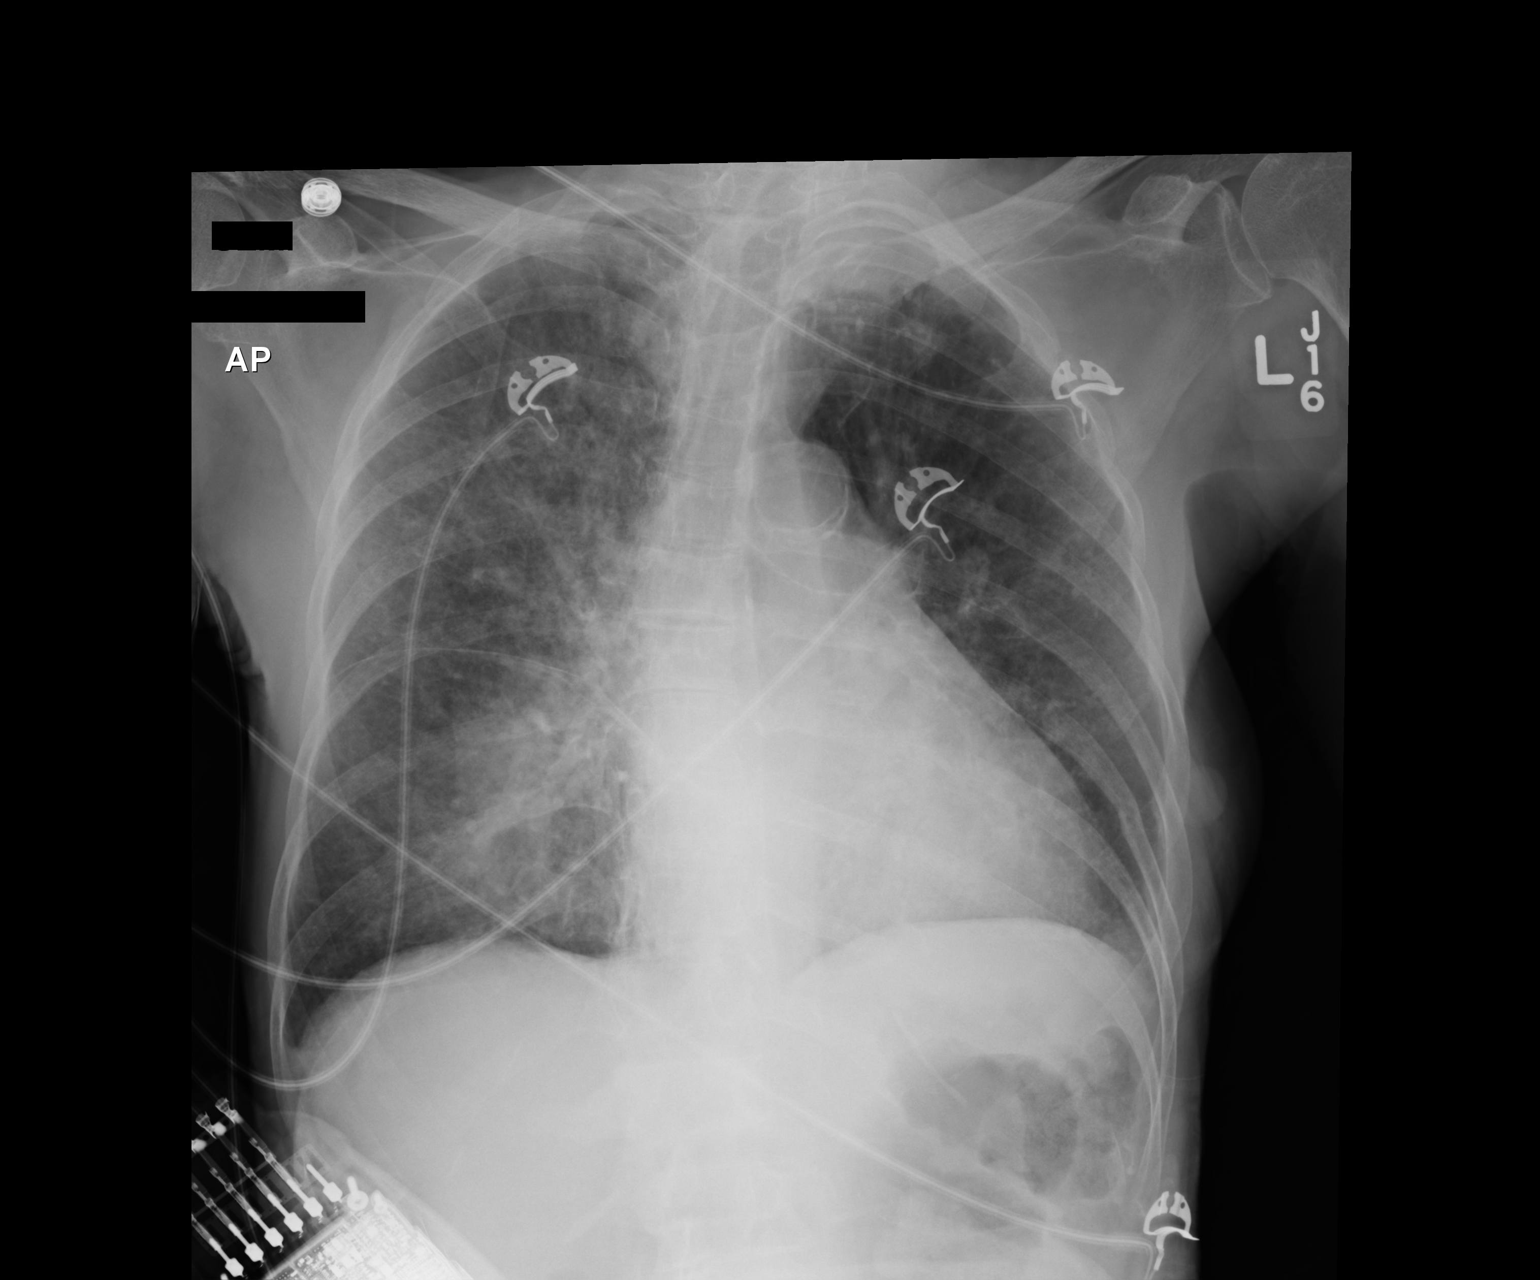

[1 of 1 positions shown; findings below may reference images not displayed]

FINDINGS: Development of RIGHT perihilar airspace disease. This appears to
involve all lobes of the RIGHT lung. In a patient with fever, this
is suspicious for multifocal pneumonia. Aspiration pneumonitis or
asymmetric pulmonary edema are less likely.

The cardiopericardial silhouette is within normal limits. The LEFT
lung appears unchanged, without airspace disease. No effusion.
Monitoring leads project over the chest.

Aortic arch atherosclerosis. Asymmetric pleural apical thickening is
present, greater on the LEFT than RIGHT.
IMPRESSION: Development of RIGHT perihilar airspace disease favored to represent
pneumonia.

## 2016-07-08 IMAGING — US US RENAL
1 series · 14 of 25 positions shown · non-contrast
Comparison: None.

CLINICAL DATA: Acute kidney injury.

EXAM:
RENAL/URINARY TRACT ULTRASOUND COMPLETE

[Series 1: us renal · 0.18mm/px · 14 of 30 slices shown]
[im 1/30]
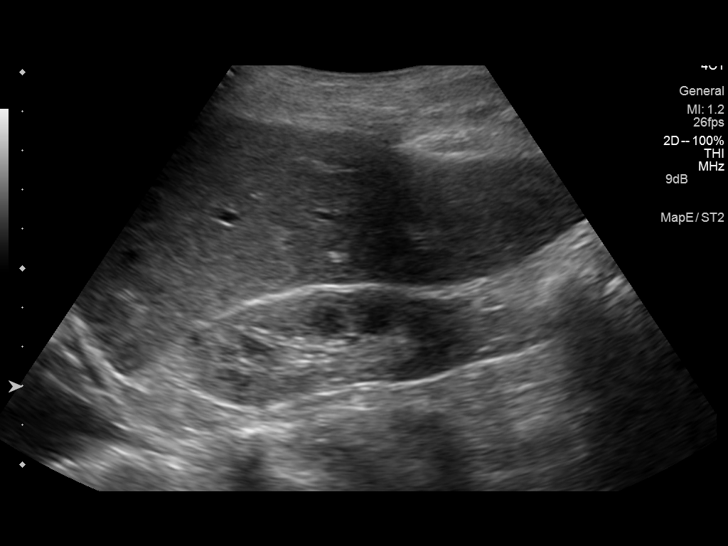
[im 3/30]
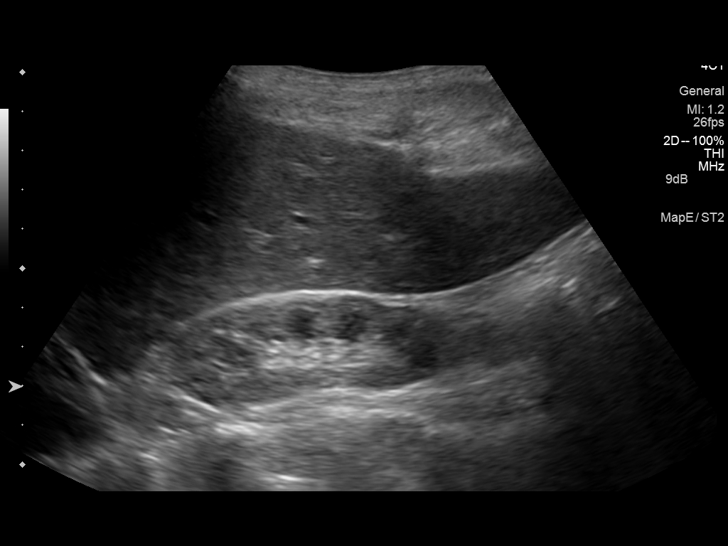
[im 5/30]
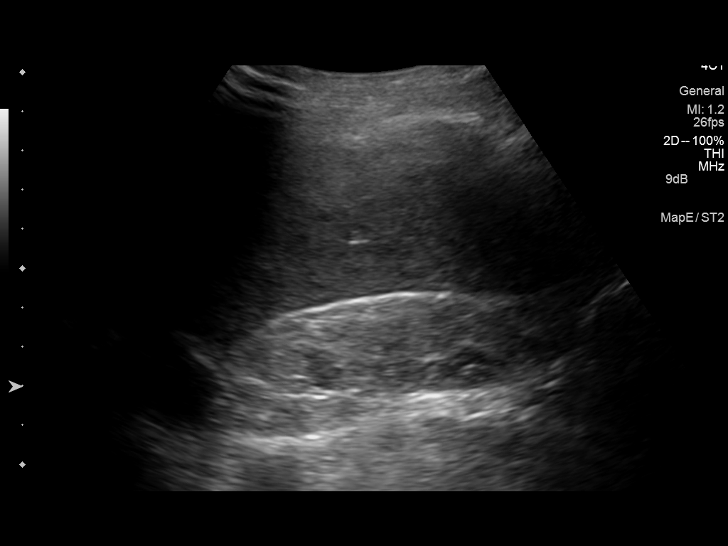
[im 8/30]
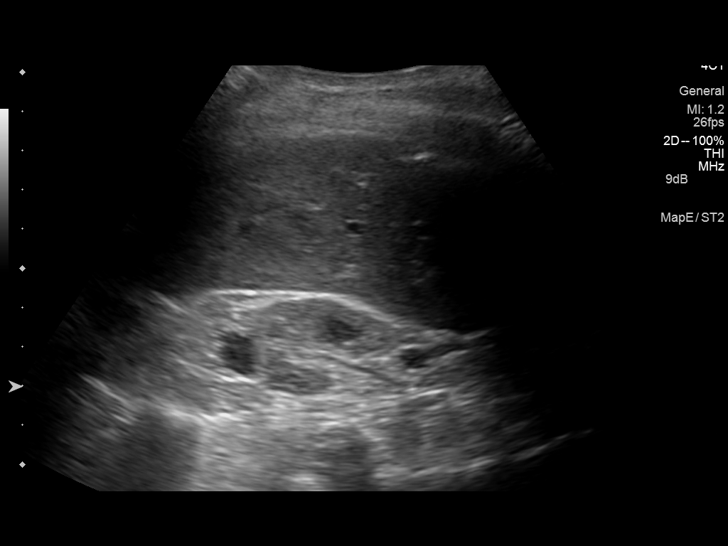
[im 10/30]
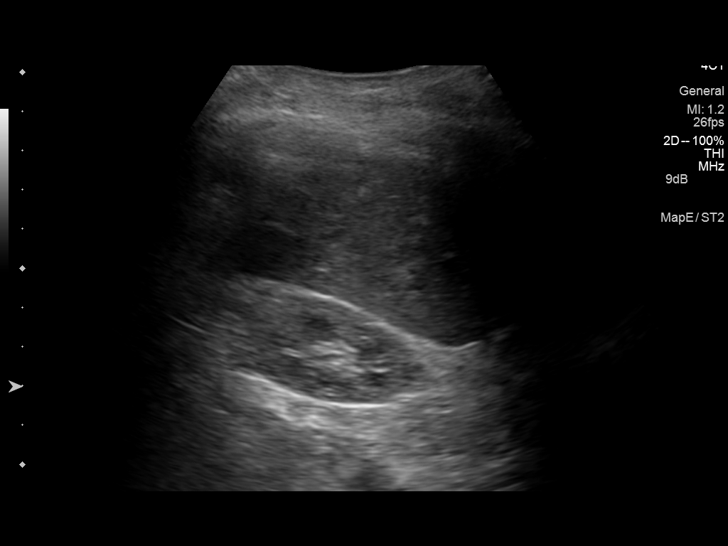
[im 11/30]
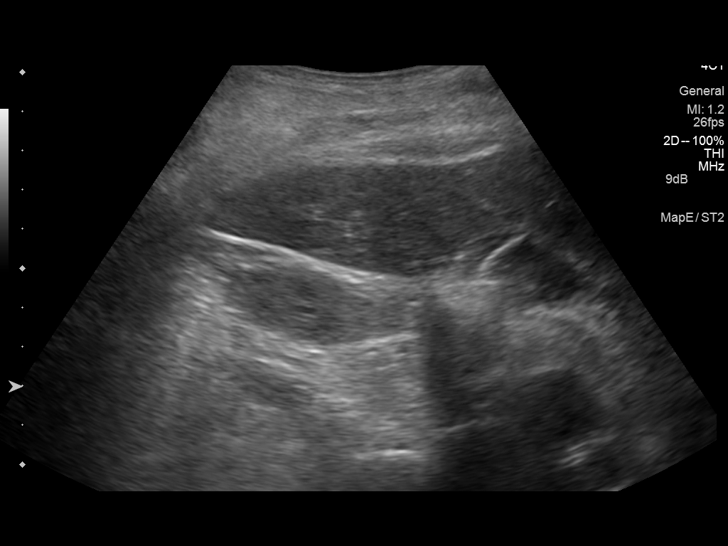
[im 14/30]
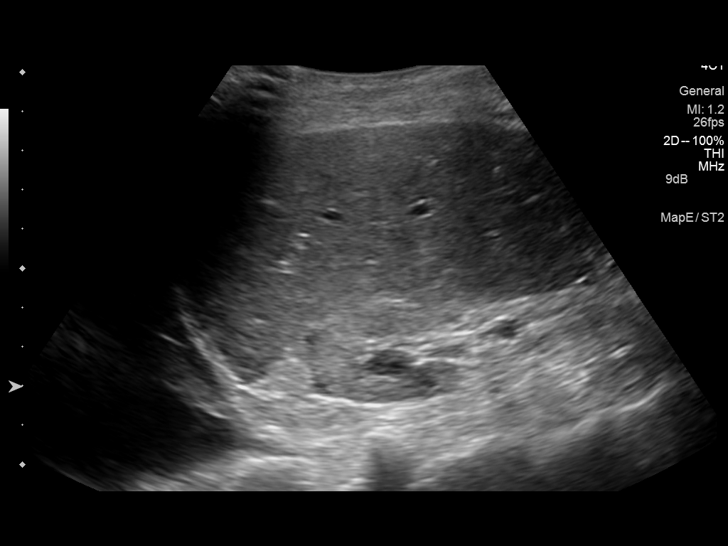
[im 16/30]
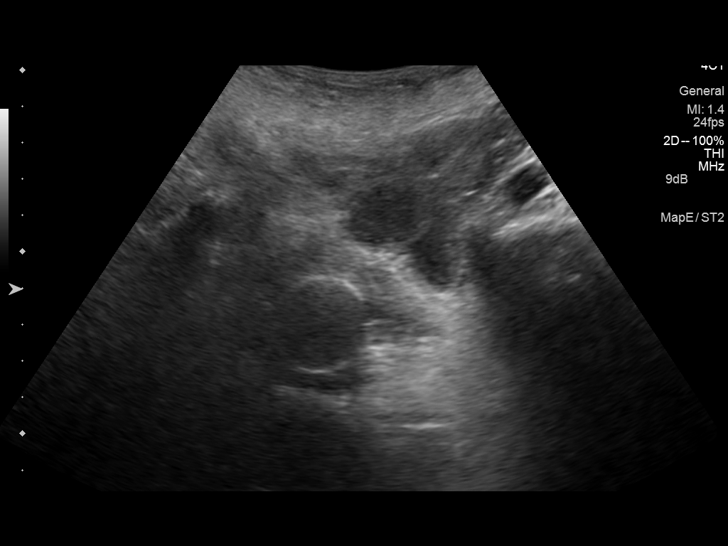
[im 19/30]
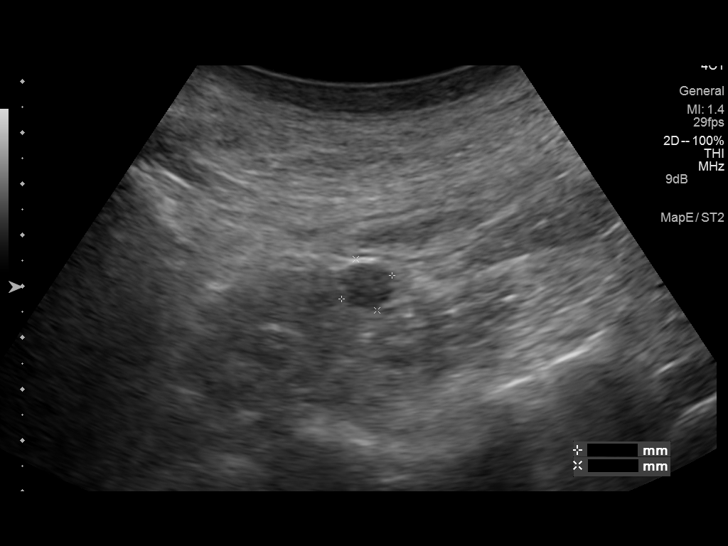
[im 20/30]
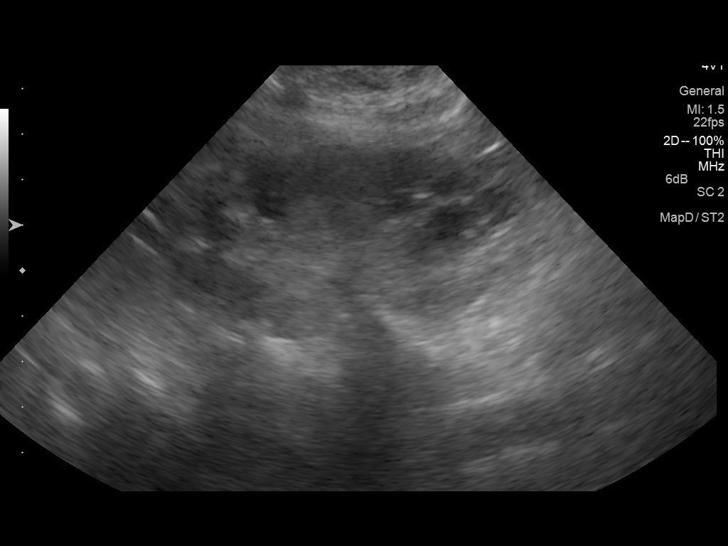
[im 22/30]
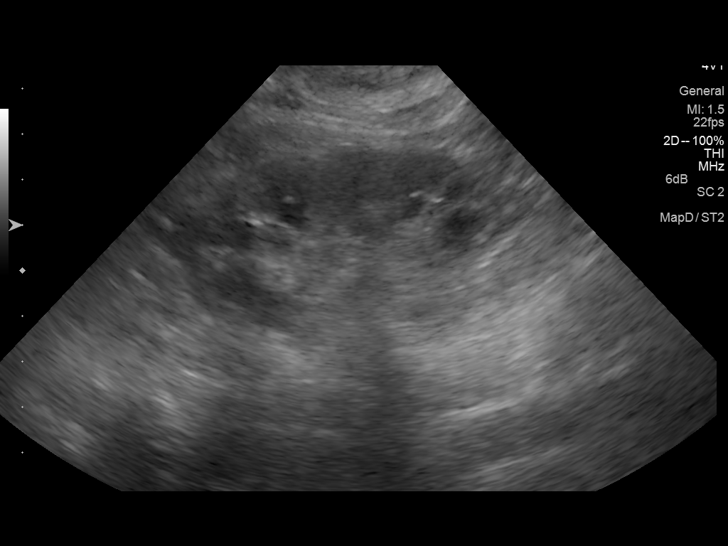
[im 25/30]
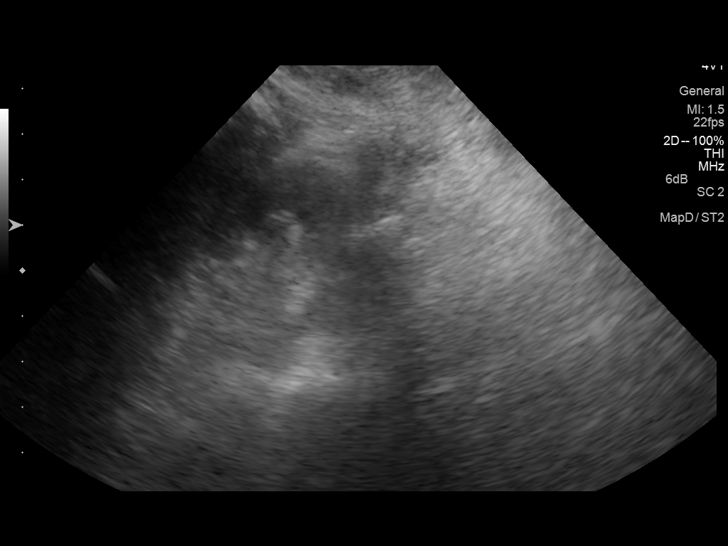
[im 27/30]
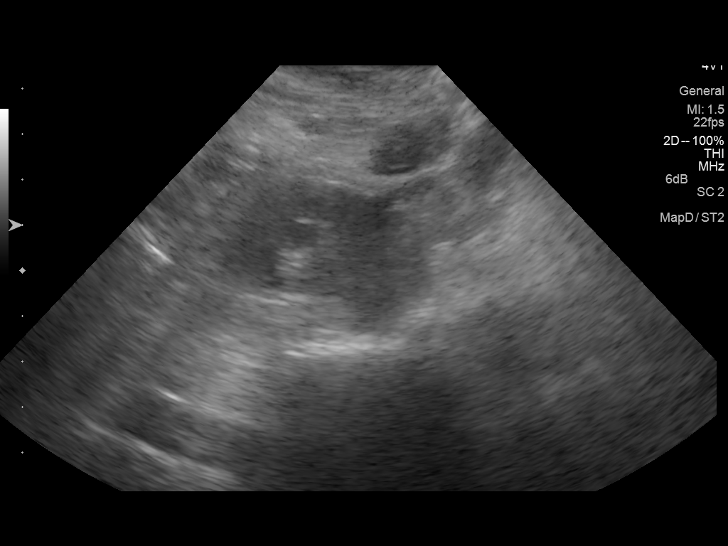
[im 30/30]
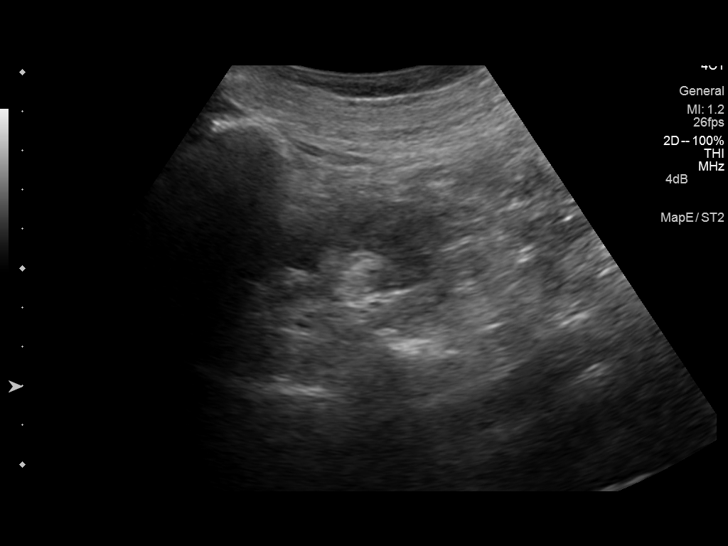

[14 of 25 positions shown; findings below may reference images not displayed]

FINDINGS: Right Kidney:

Length: 7.8 cm. Parenchymal echogenicity is increased. An anechoic
or nearly anechoic lesion off the upper pole measures 1.1 x 1.3 x
0.9 cm, most consistent with a cyst. No hydronephrosis.

Left Kidney:

Length: 8.8 cm. Parenchymal echogenicity is increased. An anechoic
or nearly anechoic lesion in the region of the junction of the mid
and lower poles measures 1.1 x 1.1 x 0.8 cm, also most consistent
with a cyst. No hydronephrosis.

Bladder:

Foley catheter is seen in a decompressed bladder.
IMPRESSION: 1. No acute findings.
2. Increased renal parenchymal echogenicity is indicative of chronic
medical renal disease.

## 2016-07-17 DIAGNOSIS — I4891 Unspecified atrial fibrillation: Secondary | ICD-10-CM | POA: Diagnosis not present

## 2016-07-17 DIAGNOSIS — F0390 Unspecified dementia without behavioral disturbance: Secondary | ICD-10-CM | POA: Diagnosis not present

## 2016-07-17 DIAGNOSIS — C180 Malignant neoplasm of cecum: Secondary | ICD-10-CM | POA: Diagnosis not present

## 2016-07-24 NOTE — Progress Notes (Signed)
This is documentation of my visit to the skilled care facility on 07/17/2016. She is sitting quietly in her room confused as always. She has no complaints. Denies chest pain shortness of breath abdominal pain nausea or vomiting.  Exam: Constitutional: She is awake and alert and in no acute distress. Cardiovascular: She is in atrial fibrillation with good control of her heart rate. Respiratory: Her lungs are clear. Gastrointestinal: Her abdomen is soft neurological: She is very confused ears nose mouth and throat: Mucous membranes are moist  Assessment is that she's about as she has been. She has multiple medical problems that seems be doing okay. She has significant dementia. She had colon cancer had: Colostomy and ileostomy and that has been reversed. She has chronic atrial fib.  No change in treatments

## 2016-08-07 DIAGNOSIS — C180 Malignant neoplasm of cecum: Secondary | ICD-10-CM | POA: Diagnosis not present

## 2016-08-07 DIAGNOSIS — I4891 Unspecified atrial fibrillation: Secondary | ICD-10-CM | POA: Diagnosis not present

## 2016-08-07 DIAGNOSIS — F0390 Unspecified dementia without behavioral disturbance: Secondary | ICD-10-CM | POA: Diagnosis not present

## 2016-08-17 DIAGNOSIS — R109 Unspecified abdominal pain: Secondary | ICD-10-CM | POA: Diagnosis not present

## 2016-08-18 NOTE — Progress Notes (Signed)
This is a redictation of a note from 08/07/2016 that I did on the telephone call in system after seeing the patient at the skilled care facility on 08/07/2016. She is known to have multiple medical problems including chronic atrial fibrillation, previous history of malignant neoplasm of the colon that has been resected. She initially had colostomy but that has been revised. She has dementia. She has peripheral arterial disease. She had a fracture of the femur and it was during her treatment for that that she was discovered to have the colon cancer. She was severely malnourished earlier but that is much improved. When I went to see her she was sitting quietly in her room in a wheelchair in no acute distress. No new complaints have been elicited from the nursing staff.  MAR reviewed. Vital signs reviewed. Physical examination shows that she is confused thin but not malnourished now. Her heart is not regular. Her surgical scars are well-healed and her abdomen is soft without masses. Her lungs are clear. Extremities showed no edema. Central nervous system exam shows that she is confused  She has multiple medical problems as above. She's been very stable with no new complaints. Her biggest issue now is her dementia and inability to take care of herself unsupervised I don't plan to change any treatments.

## 2016-09-01 DIAGNOSIS — I739 Peripheral vascular disease, unspecified: Secondary | ICD-10-CM | POA: Diagnosis not present

## 2016-09-01 DIAGNOSIS — B351 Tinea unguium: Secondary | ICD-10-CM | POA: Diagnosis not present

## 2016-09-01 DIAGNOSIS — M79674 Pain in right toe(s): Secondary | ICD-10-CM | POA: Diagnosis not present

## 2016-09-01 DIAGNOSIS — M79675 Pain in left toe(s): Secondary | ICD-10-CM | POA: Diagnosis not present

## 2016-09-08 DIAGNOSIS — F0390 Unspecified dementia without behavioral disturbance: Secondary | ICD-10-CM | POA: Diagnosis not present

## 2016-09-08 DIAGNOSIS — I4891 Unspecified atrial fibrillation: Secondary | ICD-10-CM | POA: Diagnosis not present

## 2016-09-08 DIAGNOSIS — C180 Malignant neoplasm of cecum: Secondary | ICD-10-CM | POA: Diagnosis not present

## 2016-09-15 NOTE — Progress Notes (Signed)
This is documentation of my visit at the skilled care facility for 09/08/2016. When I arrived in her room she was sitting quietly. She is in a wheelchair. She has no complaints. She is confused as always. No new problems noted by the nursing staff.  Medications reviewed in the [facility]  She is awake and alert and confused. Mucous membranes are moist. Her chest is clear. Her heart shows irregularly irregular rhythm but no gallop. Her abdomen is soft with no masses. Extremities showed no edema.  She has multiple medical problems including previous history of colon cancer requiring surgery. She seems to be over that. She's had a fracture in her femur and she's not very ambulatory. She has had protein calorie malnutrition which is better. She has chronic atrial fib which is stable.  No change in treatments. Overall she's doing well

## 2016-10-05 DIAGNOSIS — M7989 Other specified soft tissue disorders: Secondary | ICD-10-CM | POA: Diagnosis not present

## 2016-10-05 DIAGNOSIS — M25562 Pain in left knee: Secondary | ICD-10-CM | POA: Diagnosis not present

## 2016-10-11 DIAGNOSIS — N189 Chronic kidney disease, unspecified: Secondary | ICD-10-CM | POA: Diagnosis not present

## 2016-10-11 DIAGNOSIS — R262 Difficulty in walking, not elsewhere classified: Secondary | ICD-10-CM | POA: Diagnosis not present

## 2016-10-11 DIAGNOSIS — M159 Polyosteoarthritis, unspecified: Secondary | ICD-10-CM | POA: Diagnosis not present

## 2016-10-11 DIAGNOSIS — M6281 Muscle weakness (generalized): Secondary | ICD-10-CM | POA: Diagnosis not present

## 2016-10-11 DIAGNOSIS — Z9181 History of falling: Secondary | ICD-10-CM | POA: Diagnosis not present

## 2016-10-12 DIAGNOSIS — M6281 Muscle weakness (generalized): Secondary | ICD-10-CM | POA: Diagnosis not present

## 2016-10-12 DIAGNOSIS — N189 Chronic kidney disease, unspecified: Secondary | ICD-10-CM | POA: Diagnosis not present

## 2016-10-12 DIAGNOSIS — R262 Difficulty in walking, not elsewhere classified: Secondary | ICD-10-CM | POA: Diagnosis not present

## 2016-10-12 DIAGNOSIS — M159 Polyosteoarthritis, unspecified: Secondary | ICD-10-CM | POA: Diagnosis not present

## 2016-10-12 DIAGNOSIS — Z9181 History of falling: Secondary | ICD-10-CM | POA: Diagnosis not present

## 2016-10-13 DIAGNOSIS — M6281 Muscle weakness (generalized): Secondary | ICD-10-CM | POA: Diagnosis not present

## 2016-10-13 DIAGNOSIS — R262 Difficulty in walking, not elsewhere classified: Secondary | ICD-10-CM | POA: Diagnosis not present

## 2016-10-13 DIAGNOSIS — Z9181 History of falling: Secondary | ICD-10-CM | POA: Diagnosis not present

## 2016-10-13 DIAGNOSIS — M159 Polyosteoarthritis, unspecified: Secondary | ICD-10-CM | POA: Diagnosis not present

## 2016-10-13 DIAGNOSIS — N189 Chronic kidney disease, unspecified: Secondary | ICD-10-CM | POA: Diagnosis not present

## 2016-10-14 DIAGNOSIS — N189 Chronic kidney disease, unspecified: Secondary | ICD-10-CM | POA: Diagnosis not present

## 2016-10-14 DIAGNOSIS — M6281 Muscle weakness (generalized): Secondary | ICD-10-CM | POA: Diagnosis not present

## 2016-10-14 DIAGNOSIS — M159 Polyosteoarthritis, unspecified: Secondary | ICD-10-CM | POA: Diagnosis not present

## 2016-10-14 DIAGNOSIS — R262 Difficulty in walking, not elsewhere classified: Secondary | ICD-10-CM | POA: Diagnosis not present

## 2016-10-14 DIAGNOSIS — Z9181 History of falling: Secondary | ICD-10-CM | POA: Diagnosis not present

## 2016-10-15 DIAGNOSIS — M6281 Muscle weakness (generalized): Secondary | ICD-10-CM | POA: Diagnosis not present

## 2016-10-15 DIAGNOSIS — Z9181 History of falling: Secondary | ICD-10-CM | POA: Diagnosis not present

## 2016-10-15 DIAGNOSIS — R262 Difficulty in walking, not elsewhere classified: Secondary | ICD-10-CM | POA: Diagnosis not present

## 2016-10-15 DIAGNOSIS — N189 Chronic kidney disease, unspecified: Secondary | ICD-10-CM | POA: Diagnosis not present

## 2016-10-15 DIAGNOSIS — M159 Polyosteoarthritis, unspecified: Secondary | ICD-10-CM | POA: Diagnosis not present

## 2016-10-17 DIAGNOSIS — Z9181 History of falling: Secondary | ICD-10-CM | POA: Diagnosis not present

## 2016-10-17 DIAGNOSIS — R262 Difficulty in walking, not elsewhere classified: Secondary | ICD-10-CM | POA: Diagnosis not present

## 2016-10-17 DIAGNOSIS — N189 Chronic kidney disease, unspecified: Secondary | ICD-10-CM | POA: Diagnosis not present

## 2016-10-17 DIAGNOSIS — M6281 Muscle weakness (generalized): Secondary | ICD-10-CM | POA: Diagnosis not present

## 2016-10-17 DIAGNOSIS — M159 Polyosteoarthritis, unspecified: Secondary | ICD-10-CM | POA: Diagnosis not present

## 2016-10-18 DIAGNOSIS — N189 Chronic kidney disease, unspecified: Secondary | ICD-10-CM | POA: Diagnosis not present

## 2016-10-18 DIAGNOSIS — M6281 Muscle weakness (generalized): Secondary | ICD-10-CM | POA: Diagnosis not present

## 2016-10-18 DIAGNOSIS — Z9181 History of falling: Secondary | ICD-10-CM | POA: Diagnosis not present

## 2016-10-18 DIAGNOSIS — R262 Difficulty in walking, not elsewhere classified: Secondary | ICD-10-CM | POA: Diagnosis not present

## 2016-10-18 DIAGNOSIS — M159 Polyosteoarthritis, unspecified: Secondary | ICD-10-CM | POA: Diagnosis not present

## 2016-10-19 DIAGNOSIS — N189 Chronic kidney disease, unspecified: Secondary | ICD-10-CM | POA: Diagnosis not present

## 2016-10-19 DIAGNOSIS — M6281 Muscle weakness (generalized): Secondary | ICD-10-CM | POA: Diagnosis not present

## 2016-10-19 DIAGNOSIS — R262 Difficulty in walking, not elsewhere classified: Secondary | ICD-10-CM | POA: Diagnosis not present

## 2016-10-19 DIAGNOSIS — M159 Polyosteoarthritis, unspecified: Secondary | ICD-10-CM | POA: Diagnosis not present

## 2016-10-19 DIAGNOSIS — Z9181 History of falling: Secondary | ICD-10-CM | POA: Diagnosis not present

## 2016-10-20 DIAGNOSIS — N189 Chronic kidney disease, unspecified: Secondary | ICD-10-CM | POA: Diagnosis not present

## 2016-10-20 DIAGNOSIS — Z9181 History of falling: Secondary | ICD-10-CM | POA: Diagnosis not present

## 2016-10-20 DIAGNOSIS — R262 Difficulty in walking, not elsewhere classified: Secondary | ICD-10-CM | POA: Diagnosis not present

## 2016-10-20 DIAGNOSIS — M159 Polyosteoarthritis, unspecified: Secondary | ICD-10-CM | POA: Diagnosis not present

## 2016-10-20 DIAGNOSIS — M6281 Muscle weakness (generalized): Secondary | ICD-10-CM | POA: Diagnosis not present

## 2016-10-21 DIAGNOSIS — M6281 Muscle weakness (generalized): Secondary | ICD-10-CM | POA: Diagnosis not present

## 2016-10-21 DIAGNOSIS — Z9181 History of falling: Secondary | ICD-10-CM | POA: Diagnosis not present

## 2016-10-21 DIAGNOSIS — R262 Difficulty in walking, not elsewhere classified: Secondary | ICD-10-CM | POA: Diagnosis not present

## 2016-10-21 DIAGNOSIS — N189 Chronic kidney disease, unspecified: Secondary | ICD-10-CM | POA: Diagnosis not present

## 2016-10-21 DIAGNOSIS — M159 Polyosteoarthritis, unspecified: Secondary | ICD-10-CM | POA: Diagnosis not present

## 2016-10-22 DIAGNOSIS — F0390 Unspecified dementia without behavioral disturbance: Secondary | ICD-10-CM | POA: Diagnosis not present

## 2016-10-22 DIAGNOSIS — C180 Malignant neoplasm of cecum: Secondary | ICD-10-CM | POA: Diagnosis not present

## 2016-10-22 DIAGNOSIS — I4891 Unspecified atrial fibrillation: Secondary | ICD-10-CM | POA: Diagnosis not present

## 2016-10-23 NOTE — Progress Notes (Signed)
This is documentation of my visit of 10/22/2016. I found her in her room at the skilled care facility. She is sitting in a wheelchair and looks comfortable. She is awake and alert and confused.  I reviewed her medications on the [facility]  Physical examination as mentioned shows that she sitting in a wheelchair comfortable and is confused. Her chest is clear. Her heart is regular. Vital signs reviewed in the chart. Her abdomen is soft surgical scars are well-healed no edema  She has history of atrial fib and right now seems to be in sinus previous fracture of the femur colon cancer status post resection and status post revision of colostomy dementia. She is stable. \ No change in medications

## 2016-10-24 DIAGNOSIS — R262 Difficulty in walking, not elsewhere classified: Secondary | ICD-10-CM | POA: Diagnosis not present

## 2016-10-24 DIAGNOSIS — M159 Polyosteoarthritis, unspecified: Secondary | ICD-10-CM | POA: Diagnosis not present

## 2016-10-24 DIAGNOSIS — M6281 Muscle weakness (generalized): Secondary | ICD-10-CM | POA: Diagnosis not present

## 2016-10-24 DIAGNOSIS — N189 Chronic kidney disease, unspecified: Secondary | ICD-10-CM | POA: Diagnosis not present

## 2016-10-24 DIAGNOSIS — Z9181 History of falling: Secondary | ICD-10-CM | POA: Diagnosis not present

## 2016-10-26 DIAGNOSIS — N189 Chronic kidney disease, unspecified: Secondary | ICD-10-CM | POA: Diagnosis not present

## 2016-10-26 DIAGNOSIS — Z9181 History of falling: Secondary | ICD-10-CM | POA: Diagnosis not present

## 2016-10-26 DIAGNOSIS — R262 Difficulty in walking, not elsewhere classified: Secondary | ICD-10-CM | POA: Diagnosis not present

## 2016-10-26 DIAGNOSIS — M6281 Muscle weakness (generalized): Secondary | ICD-10-CM | POA: Diagnosis not present

## 2016-10-26 DIAGNOSIS — M159 Polyosteoarthritis, unspecified: Secondary | ICD-10-CM | POA: Diagnosis not present

## 2016-10-27 DIAGNOSIS — R262 Difficulty in walking, not elsewhere classified: Secondary | ICD-10-CM | POA: Diagnosis not present

## 2016-10-27 DIAGNOSIS — N189 Chronic kidney disease, unspecified: Secondary | ICD-10-CM | POA: Diagnosis not present

## 2016-10-27 DIAGNOSIS — M159 Polyosteoarthritis, unspecified: Secondary | ICD-10-CM | POA: Diagnosis not present

## 2016-10-27 DIAGNOSIS — M6281 Muscle weakness (generalized): Secondary | ICD-10-CM | POA: Diagnosis not present

## 2016-10-27 DIAGNOSIS — Z9181 History of falling: Secondary | ICD-10-CM | POA: Diagnosis not present

## 2016-10-28 DIAGNOSIS — M6281 Muscle weakness (generalized): Secondary | ICD-10-CM | POA: Diagnosis not present

## 2016-10-28 DIAGNOSIS — R262 Difficulty in walking, not elsewhere classified: Secondary | ICD-10-CM | POA: Diagnosis not present

## 2016-10-28 DIAGNOSIS — M159 Polyosteoarthritis, unspecified: Secondary | ICD-10-CM | POA: Diagnosis not present

## 2016-10-28 DIAGNOSIS — N189 Chronic kidney disease, unspecified: Secondary | ICD-10-CM | POA: Diagnosis not present

## 2016-10-28 DIAGNOSIS — Z9181 History of falling: Secondary | ICD-10-CM | POA: Diagnosis not present

## 2016-10-31 DIAGNOSIS — M159 Polyosteoarthritis, unspecified: Secondary | ICD-10-CM | POA: Diagnosis not present

## 2016-10-31 DIAGNOSIS — Z9181 History of falling: Secondary | ICD-10-CM | POA: Diagnosis not present

## 2016-10-31 DIAGNOSIS — N189 Chronic kidney disease, unspecified: Secondary | ICD-10-CM | POA: Diagnosis not present

## 2016-10-31 DIAGNOSIS — R262 Difficulty in walking, not elsewhere classified: Secondary | ICD-10-CM | POA: Diagnosis not present

## 2016-10-31 DIAGNOSIS — M6281 Muscle weakness (generalized): Secondary | ICD-10-CM | POA: Diagnosis not present

## 2016-11-01 DIAGNOSIS — Z9181 History of falling: Secondary | ICD-10-CM | POA: Diagnosis not present

## 2016-11-01 DIAGNOSIS — M6281 Muscle weakness (generalized): Secondary | ICD-10-CM | POA: Diagnosis not present

## 2016-11-01 DIAGNOSIS — R262 Difficulty in walking, not elsewhere classified: Secondary | ICD-10-CM | POA: Diagnosis not present

## 2016-11-01 DIAGNOSIS — N189 Chronic kidney disease, unspecified: Secondary | ICD-10-CM | POA: Diagnosis not present

## 2016-11-01 DIAGNOSIS — M159 Polyosteoarthritis, unspecified: Secondary | ICD-10-CM | POA: Diagnosis not present

## 2016-11-02 DIAGNOSIS — M159 Polyosteoarthritis, unspecified: Secondary | ICD-10-CM | POA: Diagnosis not present

## 2016-11-02 DIAGNOSIS — R262 Difficulty in walking, not elsewhere classified: Secondary | ICD-10-CM | POA: Diagnosis not present

## 2016-11-02 DIAGNOSIS — Z9181 History of falling: Secondary | ICD-10-CM | POA: Diagnosis not present

## 2016-11-02 DIAGNOSIS — N189 Chronic kidney disease, unspecified: Secondary | ICD-10-CM | POA: Diagnosis not present

## 2016-11-02 DIAGNOSIS — M6281 Muscle weakness (generalized): Secondary | ICD-10-CM | POA: Diagnosis not present

## 2016-11-17 ENCOUNTER — Encounter (HOSPITAL_COMMUNITY)
Admission: AD | Admit: 2016-11-17 | Discharge: 2016-11-17 | Disposition: A | Discharge status: Home / Self Care | Payer: Medicare HMO | Source: Skilled Nursing Facility | Attending: Pulmonary Disease | Admitting: Pulmonary Disease

## 2016-11-17 DIAGNOSIS — M159 Polyosteoarthritis, unspecified: Secondary | ICD-10-CM | POA: Diagnosis not present

## 2016-11-17 DIAGNOSIS — M6281 Muscle weakness (generalized): Secondary | ICD-10-CM | POA: Diagnosis not present

## 2016-11-17 DIAGNOSIS — F329 Major depressive disorder, single episode, unspecified: Secondary | ICD-10-CM | POA: Insufficient documentation

## 2016-11-17 DIAGNOSIS — N189 Chronic kidney disease, unspecified: Secondary | ICD-10-CM | POA: Diagnosis not present

## 2016-11-17 DIAGNOSIS — R262 Difficulty in walking, not elsewhere classified: Secondary | ICD-10-CM | POA: Diagnosis not present

## 2016-11-17 LAB — CBC
HCT: 30.4 % — ABNORMAL LOW (ref 36.0–46.0)
Hemoglobin: 9.8 g/dL — ABNORMAL LOW (ref 12.0–15.0)
MCH: 26.9 pg (ref 26.0–34.0)
MCHC: 32.2 g/dL (ref 30.0–36.0)
MCV: 83.5 fL (ref 78.0–100.0)
Platelets: 294 10*3/uL (ref 150–400)
RBC: 3.64 MIL/uL — AB (ref 3.87–5.11)
RDW: 13.8 % (ref 11.5–15.5)
WBC: 7.1 10*3/uL (ref 4.0–10.5)

## 2016-11-26 DIAGNOSIS — C180 Malignant neoplasm of cecum: Secondary | ICD-10-CM | POA: Diagnosis not present

## 2016-11-26 DIAGNOSIS — I4891 Unspecified atrial fibrillation: Secondary | ICD-10-CM | POA: Diagnosis not present

## 2016-11-26 DIAGNOSIS — F0390 Unspecified dementia without behavioral disturbance: Secondary | ICD-10-CM | POA: Diagnosis not present

## 2016-11-27 NOTE — Progress Notes (Signed)
This is documentation of my visit at the skilled care facility for 11/26/2016. I found her in her room in a wheelchair and in no acute distress. She had no complaints but she has significant confusion so her history is not reliable. No problems have been noted by the nursing staff.  I reviewed medications in the [facility] and their correct  Physical examination: Constitutional: She is awake and alert confused in no acute distress. Eyes: Pupils react. Ears nose mouth and throat: Her mucous membranes are moist. Her hearing is grossly normal. Cardiovascular: Her heart is regular and I can tell by exam and she has A. fib which she has had in the past. She has no edema. Respiratory: Her respiratory effort is normal. Gastrointestinal: Her surgical scars are well-healed. Musculoskeletal: Muscle strength is normal. Neurological: She is very confused  She has multiple medical problems including history of colon cancer. She had surgery that left her with colostomy but that has been revised now. She has had atrial fib but I can't tell she is in atrial fibrillation or not. During the time that she was having some much trouble with colon cancer she had significant protein calorie malnutrition and that has improved. She has significant dementia which is unchanged  Continue current treatments. No change in meds. She will not be able to live independently

## 2016-12-10 DIAGNOSIS — F0390 Unspecified dementia without behavioral disturbance: Secondary | ICD-10-CM | POA: Diagnosis not present

## 2016-12-10 DIAGNOSIS — I4891 Unspecified atrial fibrillation: Secondary | ICD-10-CM | POA: Diagnosis not present

## 2016-12-10 DIAGNOSIS — C180 Malignant neoplasm of cecum: Secondary | ICD-10-CM | POA: Diagnosis not present

## 2016-12-11 NOTE — Progress Notes (Signed)
This is documentation of my visit at the skilled care facility of 12/10/2016. She has multiple medical problems but is pretty stable now. She had colon cancer and had a colostomy which has been revised. She has atrial fib. She had been quite anemic. She had a fall several years ago with a fractured femur. She has dementia. 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 No new problems are noted. She is confused. She doesn't voice any problems including chest pain nausea vomiting diarrhea shortness of breath but I don't know that that history is reliable. No problems noted from nursing staff  I reviewed his [facility]  Physical examination: Constitutional: She is awake and alert sitting in a wheelchair. Eyes: Pupils react. Ears nose mouth and throat: Mucous membranes are moist. She is mildly hard of hearing. Cardiovascular: She seems to be in atrial fib today. No gallop. No edema. Respiratory: Normal respiratory effort. Skin: Warm and dry. Neurological: She is very confused  She has multiple medical problems including dementia now. She seems pretty stable but I don't think she is ever going to be able to live independently

## 2017-01-10 DIAGNOSIS — I70203 Unspecified atherosclerosis of native arteries of extremities, bilateral legs: Secondary | ICD-10-CM | POA: Diagnosis not present

## 2017-01-10 DIAGNOSIS — B351 Tinea unguium: Secondary | ICD-10-CM | POA: Diagnosis not present

## 2017-01-10 DIAGNOSIS — M6281 Muscle weakness (generalized): Secondary | ICD-10-CM | POA: Diagnosis not present

## 2017-01-15 DIAGNOSIS — C180 Malignant neoplasm of cecum: Secondary | ICD-10-CM | POA: Diagnosis not present

## 2017-01-15 DIAGNOSIS — F0390 Unspecified dementia without behavioral disturbance: Secondary | ICD-10-CM | POA: Diagnosis not present

## 2017-01-15 DIAGNOSIS — I4891 Unspecified atrial fibrillation: Secondary | ICD-10-CM | POA: Diagnosis not present

## 2017-01-15 NOTE — Progress Notes (Signed)
This is documentation of my visit at the skilled care facility of 01/15/2017  Subjective: She says she feels well. She has no complaints. She is confused so her history is unreliable. She specifically denies chest pain abdominal pain nausea or vomiting  I reviewed medications in the [facility]. These are given at my direction  Objective: Constitutional she is awake and alert sitting in a wheelchair and drowsy. Eyes: Pupils react. Ears nose mouth and throat: Mucous membranes are moist. Cardiovascular: Her heart is generally regular and I cannot tell she is in atrial fib now. Respiratory: Her lungs are clear. Gastrointestinal: Her abdomen is soft with no masses and no tenderness  Assessment: She was admitted after a femur fracture and at that time she was found to be markedly anemic. This is several years ago. She was found to have colon cancer. She eventually underwent resection of the colon cancer which required a diverting colostomy. This has been reversed now. She is known to have atrial fib. She has had severe protein calorie malnutrition during the time that she was undergoing surgery and that has resolved. She has dementia which I think is about the same  Plan: Continue treatments

## 2017-02-11 DIAGNOSIS — C180 Malignant neoplasm of cecum: Secondary | ICD-10-CM | POA: Diagnosis not present

## 2017-02-11 DIAGNOSIS — I4891 Unspecified atrial fibrillation: Secondary | ICD-10-CM | POA: Diagnosis not present

## 2017-02-11 DIAGNOSIS — F0390 Unspecified dementia without behavioral disturbance: Secondary | ICD-10-CM | POA: Diagnosis not present

## 2017-02-11 NOTE — Progress Notes (Signed)
This is documentation of my visit at the skilled care facility for 02/11/2017.   Subjective: When I arrived she was in her room. She is confused. She voices no complaints.  Objective: Constitutional: She is awake but confused. Cardiovascular: Her heart is regular with normal heart sounds. Respiratory: Her respiratory effort is normal and her lungs are clear. Gastrointestinal: Her abdomen is soft with no masses and normal bowel sounds  Assessment: She has dementia. She is confused. She has history of colon cancer had a resection which required her to have colostomy which was temporary and she has since been reconnected and is doing very well.  Plan: No change in treatments

## 2017-03-05 DIAGNOSIS — C180 Malignant neoplasm of cecum: Secondary | ICD-10-CM | POA: Diagnosis not present

## 2017-03-05 DIAGNOSIS — F0390 Unspecified dementia without behavioral disturbance: Secondary | ICD-10-CM | POA: Diagnosis not present

## 2017-03-05 DIAGNOSIS — I4891 Unspecified atrial fibrillation: Secondary | ICD-10-CM | POA: Diagnosis not present

## 2017-03-06 NOTE — Progress Notes (Signed)
This is documentation of my visit of 03/05/2017. It is dictated today because the dictation system is not available at the skilled care facility  Subjective: When I arrived in her room she sitting quietly in a wheelchair. She is confused. No new complaints.  MAR reviewed in medications are appropriate  Objective: Constitutional: She is awake and alert. She is confused. Cardiovascular: I can't tell she is in atrial fib now or not. She does not have any edema. Respiratory: Her respiratory effort is normal. Gastrointestinal: Her abdomen is soft with no masses. Neurological/psychiatric she is confused but pleasant  Assessment: She has dementia. She has had colon cancer. She had surgery for that and initially required colostomy but she's not requiring it that now. She has had re-anastomosis. She is generally doing fairly well. She has history of atrial fib and she's had malnutrition but that is much improved  Plan: Continue treatments. She will not be able to live independently

## 2017-04-16 DIAGNOSIS — C180 Malignant neoplasm of cecum: Secondary | ICD-10-CM | POA: Diagnosis not present

## 2017-04-16 DIAGNOSIS — I4891 Unspecified atrial fibrillation: Secondary | ICD-10-CM | POA: Diagnosis not present

## 2017-04-16 DIAGNOSIS — F0390 Unspecified dementia without behavioral disturbance: Secondary | ICD-10-CM | POA: Diagnosis not present

## 2017-04-26 NOTE — Progress Notes (Signed)
On reviewing her chart today I see that the progress note that I dictated earlier this month after my visit with her at the skilled care facility is not available so I will redictate this was originally done on the "call-in" system  Subjective: She is sitting quietly in her room. She is confused. She has no complaints.  Objective: Constitutional she is awake and alert and confused. Respiratory: Her respiratory effort is normal and her lungs are clear. Cardiovascular: She seems to be in atrial fib with no gallop. Gastrointestinal: Her abdomen is soft with no masses. Surgical scars are well-healed. Skin: Warm and dry. Neurological: She is confused  I reviewed the MAR and find her medications to be appropriate  Assessment : She has dementia which is unchanged and gradually worsening. She has had atrial fib which is stable. She had colon cancer had a resection with diverting colostomy and she eventually was able to be reconnected and she's done very well since then. She had severe malnutrition at that time but that is better. She has had previous fracture of the femur stable  Plan: Continue treatments. She will not be able to live independently

## 2017-05-12 ENCOUNTER — Non-Acute Institutional Stay (SKILLED_NURSING_FACILITY): Payer: Medicare HMO | Admitting: Internal Medicine

## 2017-05-12 DIAGNOSIS — D509 Iron deficiency anemia, unspecified: Secondary | ICD-10-CM

## 2017-05-12 DIAGNOSIS — J309 Allergic rhinitis, unspecified: Secondary | ICD-10-CM

## 2017-05-12 DIAGNOSIS — I10 Essential (primary) hypertension: Secondary | ICD-10-CM

## 2017-05-12 DIAGNOSIS — C189 Malignant neoplasm of colon, unspecified: Secondary | ICD-10-CM | POA: Diagnosis not present

## 2017-05-12 DIAGNOSIS — N184 Chronic kidney disease, stage 4 (severe): Secondary | ICD-10-CM | POA: Insufficient documentation

## 2017-05-12 DIAGNOSIS — N185 Chronic kidney disease, stage 5: Secondary | ICD-10-CM | POA: Diagnosis not present

## 2017-05-12 NOTE — Progress Notes (Signed)
This is an acute visit.  Level care skilled.  Facility is [facility].  Chief complaint acute visit secondary to nasal drainage-also concerns for UTI.  History of present illness.  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  She also has a history of anemia she is on iron last hemoglobin was 9.8 on lab done back in March 2018.  She also has a listed history of chronic kidney disease her creatinine was 1.95 BUN 46 on lab done approximately a year ago.  Today nursing staff is concerned thinking she has a runny nose-she does not complain of any shortness of breath apparently occasional cough-.  Nursing staff also thinks she may have a UTI she's complain at times of some low back pain which apparently she complains of when she has a UTI in her urine apparently looks suspicious as well as.  Vital signs are stable she is afebrile her main complaint is the runny nose today.  Previous medical history.  Includes irritable bowel syndrome.  Hypertension-GERD-anemia-chronic kidney disease-.  Medications reviewed and include.  Norvasc 10 mg by mouth daily.  Lopressor 50 mg twice a day.  Ferrous sulfate 325 mg twice a day.  And Tylenol when necessary.  . Per family medical history is unavailable at this time patient is somewhat of a poor historian.  Review of systems.  General she does not complaining any fever or chills.  Skin does not complain of rashes or itching.  Head ears eyes nose mouth and throat does complain of a  runny nose apparently of clear drainage possibly some eye discharge.  Also possibly small amount of soreness of her throat.  Respiratory denies shortness of breath says she has an occasional cough however.  Cardiac does not complain of chest pain or significant lower extremity edema.  GI history as noted above but she does not have complaints of abdominal pain nausea vomiting diarrhea or constipation.  Musculoskeletal largely LVH and wheelchair does not complain of joint pain currently.  Neurologic does not complain of dizziness headache or syncope.  Psych does not complain of overt anxiety or depression nursing staff does not report any issues in this regard either.  Physical exam.  She is afebrile pulse of 68 respirations of 17 and blood pressure is 121/65--weight is 111 pounds  In general this is a somewhat frail elderly female in no distress sitting comfortably in her wheelchair.  Her skin is warm and dry do note she has a raised area on the left side of her lower back this is fairly large flesh-colored not really tender or erythematous has somewhat of a cystlike lipoma presentation.  This is chronic per nursing staff.  Eyes pupils appear reactive light sclera and conjunctiva are clear possibly small amount of clear drainage.  Nose again possibly small amount of clear drainage.  Oropharynx is clear mucous membranes moist she did not open her mouth very wide however.  Chest is clear to auscultation there is no labored breathing.  Heart is regular rate and rhythm without murmur  gallop or rub she does not really have significant lower extremity edema pedal pulses are somewhat reduced bilaterally.  Abdomen she has well-healed surgical scars it is soft nontender with positive bowel sounds.  Muscle skeletal does move all extremities 4 largely ambulates in a wheelchair but will ambulate at times in her walker with restorative therapy. as noted above she has a raised area left  side of her lower back-there is some minimal tenderness to palpation of her back--CV tenderness??-- and did not note any deformity  Neurologic is grossly intact her speech is clear no lateralizing findings.  Psych somewhat difficult assessment since she is quite soft spoken but she appears to be grossly alert and oriented follow simple verbal commands without difficulty appeared able to give me accurate history in regards to an not having children but raising several other people's children.  Labs.  11/17/2016.  WBC 7.1 hemoglobin 9.8 platelets 294.  03/07/2016.  Sodium 140 potassium 4.7 BUN 46 creatinine 1.95.  Assessment and plan.  #1-suspect allergic rhinitis with runny nose and eyes-will treat with a short course of Claritin 10 mg daily 5 days and monitor also vital signs pulse ox every shift for 72 hours to keep an eye to make sure this does not progress.  #2 history of questionable low back pain UTI-will obtain a urinalysis and culture and monitor this as well back pain does not appear to be an acute type  #3 hypertension-per reviewed. Blood pressure been fairly well-controlled 110/56-colon carcin  104/67 pulse is in the 50s and 60s I got 68 on exam today at this point will monitor she continues on Norvasc 10 mg daily Lopressor 50 mg twice a day.  #4 anemia last hemoglobin was 9.8 on lab done back in March 2018 she is on iron Will update this as well  next Laboratory day.  #5 history of chronic kidney disease last creatinine was 1.95 BUN of 46 on lab done in July 2017 will update this as well.  #6 history of colon carcinoma again conservative follow-up is desired by patient and family per review of notes from [facility].  #7 GERD -currently not on a proton pump inhibitor. She was  On one  at one point but she  appears asymptomatic at this point  Clinically she appears to be stable and doing well but will do a trial course of Claritin and monitor  vital signs as well as obtain a urine culture secondary to concerns of UTI per nursing staff.--Will obtain lab work for updated values with her history of anemia and renal insufficiency  [id number]-of note greater than 40 minutes spent assessing patient-discussing her status with nursing staff reviewing her chart-reviewing her labs-and coordinating and formulating a plan of care for numerous diagnoses-of note greater than 50% of time spent coordinating plan of care with input as noted above

## 2017-05-13 ENCOUNTER — Encounter (HOSPITAL_COMMUNITY)
Admission: AD | Admit: 2017-05-13 | Discharge: 2017-05-13 | Disposition: A | Discharge status: Home / Self Care | Payer: Medicare HMO | Source: Skilled Nursing Facility | Attending: Pulmonary Disease | Admitting: Pulmonary Disease

## 2017-05-13 DIAGNOSIS — N189 Chronic kidney disease, unspecified: Secondary | ICD-10-CM | POA: Insufficient documentation

## 2017-05-13 LAB — URINALYSIS, ROUTINE W REFLEX MICROSCOPIC
BILIRUBIN URINE: NEGATIVE
GLUCOSE, UA: NEGATIVE mg/dL
Hgb urine dipstick: NEGATIVE
Ketones, ur: NEGATIVE mg/dL
Nitrite: NEGATIVE
PROTEIN: 30 mg/dL — AB
Specific Gravity, Urine: 1.015 (ref 1.005–1.030)
pH: 5 (ref 5.0–8.0)

## 2017-05-15 ENCOUNTER — Encounter (HOSPITAL_COMMUNITY)
Admission: RE | Admit: 2017-05-15 | Discharge: 2017-05-15 | Disposition: A | Discharge status: Home / Self Care | Payer: Medicare HMO | Source: Ambulatory Visit | Attending: Internal Medicine | Admitting: Internal Medicine

## 2017-05-15 DIAGNOSIS — N189 Chronic kidney disease, unspecified: Secondary | ICD-10-CM | POA: Insufficient documentation

## 2017-05-15 DIAGNOSIS — Z9181 History of falling: Secondary | ICD-10-CM | POA: Diagnosis not present

## 2017-05-15 DIAGNOSIS — M6281 Muscle weakness (generalized): Secondary | ICD-10-CM | POA: Insufficient documentation

## 2017-05-15 DIAGNOSIS — F5089 Other specified eating disorder: Secondary | ICD-10-CM | POA: Insufficient documentation

## 2017-05-15 DIAGNOSIS — D649 Anemia, unspecified: Secondary | ICD-10-CM | POA: Diagnosis not present

## 2017-05-15 DIAGNOSIS — M159 Polyosteoarthritis, unspecified: Secondary | ICD-10-CM | POA: Insufficient documentation

## 2017-05-15 LAB — CBC WITH DIFFERENTIAL/PLATELET
BASOS ABS: 0 10*3/uL (ref 0.0–0.1)
Basophils Relative: 1 %
Eosinophils Absolute: 0.2 10*3/uL (ref 0.0–0.7)
Eosinophils Relative: 2 %
HCT: 32.8 % — ABNORMAL LOW (ref 36.0–46.0)
Hemoglobin: 10.5 g/dL — ABNORMAL LOW (ref 12.0–15.0)
LYMPHS ABS: 2 10*3/uL (ref 0.7–4.0)
Lymphocytes Relative: 28 %
MCH: 26.8 pg (ref 26.0–34.0)
MCHC: 32 g/dL (ref 30.0–36.0)
MCV: 83.7 fL (ref 78.0–100.0)
Monocytes Absolute: 0.8 10*3/uL (ref 0.1–1.0)
Monocytes Relative: 12 %
NEUTROS ABS: 4.1 10*3/uL (ref 1.7–7.7)
Neutrophils Relative %: 57 %
Platelets: 240 10*3/uL (ref 150–400)
RBC: 3.92 MIL/uL (ref 3.87–5.11)
RDW: 13.8 % (ref 11.5–15.5)
WBC: 7 10*3/uL (ref 4.0–10.5)

## 2017-05-15 LAB — COMPREHENSIVE METABOLIC PANEL
ALT: 11 U/L — AB (ref 14–54)
AST: 17 U/L (ref 15–41)
Albumin: 3.3 g/dL — ABNORMAL LOW (ref 3.5–5.0)
Alkaline Phosphatase: 72 U/L (ref 38–126)
Anion gap: 9 (ref 5–15)
BILIRUBIN TOTAL: 0.3 mg/dL (ref 0.3–1.2)
BUN: 50 mg/dL — AB (ref 6–20)
CO2: 25 mmol/L (ref 22–32)
CREATININE: 2.21 mg/dL — AB (ref 0.44–1.00)
Calcium: 8.8 mg/dL — ABNORMAL LOW (ref 8.9–10.3)
Chloride: 104 mmol/L (ref 101–111)
GFR calc Af Amer: 21 mL/min — ABNORMAL LOW (ref >60)
GFR calc non Af Amer: 18 mL/min — ABNORMAL LOW (ref >60)
Glucose, Bld: 92 mg/dL (ref 65–99)
Potassium: 5.5 mmol/L — ABNORMAL HIGH (ref 3.5–5.1)
Sodium: 138 mmol/L (ref 135–145)
TOTAL PROTEIN: 6.8 g/dL (ref 6.5–8.1)

## 2017-05-16 ENCOUNTER — Non-Acute Institutional Stay (SKILLED_NURSING_FACILITY): Payer: Medicare HMO | Admitting: Internal Medicine

## 2017-05-16 ENCOUNTER — Encounter: Payer: Self-pay | Admitting: Internal Medicine

## 2017-05-16 DIAGNOSIS — N3000 Acute cystitis without hematuria: Secondary | ICD-10-CM

## 2017-05-16 DIAGNOSIS — D509 Iron deficiency anemia, unspecified: Secondary | ICD-10-CM | POA: Diagnosis not present

## 2017-05-16 DIAGNOSIS — E875 Hyperkalemia: Secondary | ICD-10-CM

## 2017-05-16 DIAGNOSIS — N185 Chronic kidney disease, stage 5: Secondary | ICD-10-CM

## 2017-05-16 DIAGNOSIS — I10 Essential (primary) hypertension: Secondary | ICD-10-CM | POA: Diagnosis not present

## 2017-05-16 LAB — URINE CULTURE: Culture: >=100000 — AB

## 2017-05-16 NOTE — Progress Notes (Signed)
Location:   [facility] Room Number: 113/D Place of Service:  SNF (31) Provider:  Quintella Baton, MD  Patient Care Team: Sinda Du, MD as PCP - General (Pulmonary Disease) [name], MD as Consulting Physician (Gastroenterology)  Extended Emergency Contact Information Primary Emergency Contact: [name] of [place] Phone: [phone] Mobile Phone: [phone] Relation: Niece Secondary Emergency Contact: [place] of [place] Phone: [phone] Relation: Neighbor  Code Status:  Full Code Goals of care: Advanced Directive information Advanced Directives 05/16/2017  Does Patient Have a Medical Advance Directive? Yes  Type of Advance Directive (No Data)  Does patient want to make changes to medical advance directive? No - Patient declined  Copy of [place] in Chart? -  Would patient like information on creating a medical advance directive? -     Chief Complaint  Patient presents with  . New Admit To SNF    New Admission Visit  . Acute Visit    Acute Visit    HPI:  Pt is a 83 y.o. female seen today for an acute visit for Hyperkalemia and Renal Insufficiency.  Patient has h/o Hypertension, Colorectal cancer s/p Right Colectomy, Anemia, And Chronic renal disease.  Patient is Long term resident of SNF. But she is new patient for our practice. She was recently seen for UTI and now is on Cipro. She is also on Claritin for Rhinitis. Patient says she has cough with Clear Sputum. No fever , Chills or SOB.  She has no other Nursing issues. Her appetite is good and her weight is stable at 112 lbs. She had some blood work which showed worsening Renal function with Mild Hyperkalemia.     Past Medical History:  Diagnosis Date  . Anemia   . Arthritis   . History of blood transfusion    "today is the first time" (08/05/2014)  . Hypertension    Past  Surgical History:  Procedure Laterality Date  . APPENDECTOMY    . CATARACT EXTRACTION Left   . CHOLECYSTECTOMY    . COLONOSCOPY N/A 09/26/2014   Procedure: COLONOSCOPY;  Surgeon: [name], MD;  Location: [facility];  Service: Endoscopy;  Laterality: N/A;  1030am  . CYSTECTOMY     "had cyst taken off lower back"  . ESOPHAGOGASTRODUODENOSCOPY N/A 08/22/2014   PPJ:KDTOIZTI ring at the gastro junction/small HH  . ORIF FEMUR FRACTURE Left 08/07/2014   Procedure: OPEN REDUCTION INTERNAL FIXATION (ORIF)  LEFT FEMUR FRACTURE;  Surgeon: [name], MD;  Location: [facility];  Service: Orthopedics;  Laterality: Left;  . TONSILLECTOMY      No Known Allergies  Outpatient Encounter Prescriptions as of 05/16/2017  Medication Sig  . acetaminophen (TYLENOL) 325 MG tablet Take 650 mg by mouth every 6 (six) hours as needed.  Marland Kitchen amLODipine (NORVASC) 10 MG tablet Take 1 tablet (10 mg total) by mouth daily.  . ciprofloxacin (CIPRO) 250 MG tablet Take 250 mg by mouth 2 (two) times daily.  . ferrous sulfate 325 (65 FE) MG tablet Take 325 mg by mouth 2 (two) times daily with a meal.  . loratadine (CLARITIN) 10 MG tablet Take 10 mg by mouth daily.  . metoprolol (LOPRESSOR) 50 MG tablet Take 1 tablet (50 mg total) by mouth 2 (two) times daily.  . Nutritional Supplements (ENSURE ENLIVE PO) Give once day  . Polyethyl Glycol-Propyl Glycol (SYSTANE) 0.4-0.3 % GEL ophthalmic gel Place 1 application into both  eyes 2 (two) times daily at 10 AM and 5 PM.  . Probiotic Product (RISA-BID PROBIOTIC) TABS Give 1 tablet by mouth twice a day  . [DISCONTINUED] feeding supplement, ENSURE COMPLETE, (ENSURE COMPLETE) LIQD Take 237 mLs by mouth 2 (two) times daily between meals. (Patient taking differently: Take 237 mLs by mouth daily. )  . [DISCONTINUED] hydrALAZINE (APRESOLINE) 25 MG tablet Take 2 tablets (50 mg total) by mouth every 8 (eight) hours. (Patient not taking: Reported on 05/12/2017)  . [DISCONTINUED] mirtazapine  (REMERON) 7.5 MG tablet Take 1 tablet (7.5 mg total) by mouth at bedtime. (Patient not taking: Reported on 05/12/2017)  . [DISCONTINUED] pantoprazole (PROTONIX) 40 MG tablet Take 1 tablet (40 mg total) by mouth daily before breakfast. (Patient not taking: Reported on 05/12/2017)   No facility-administered encounter medications on file as of 05/16/2017.      Review of Systems  Review of Systems  Constitutional: Negative for activity change, appetite change, chills, diaphoresis, fatigue and fever.  HENT: Negative for mouth sores, postnasal drip, rhinorrhea, sinus pain and sore throat.   Respiratory: Negative for apnea, Positive for cough, No chest tightness, shortness of breath and wheezing.   Cardiovascular: Negative for chest pain, palpitations and leg swelling.  Gastrointestinal: Negative for abdominal distention, abdominal pain, constipation, diarrhea, nausea and vomiting.  Genitourinary: Negative for dysuria and frequency.  Musculoskeletal: Negative for arthralgias, joint swelling and myalgias.  Skin: Negative for rash.  Neurological: Negative for dizziness, syncope, weakness, light-headedness and numbness.  Psychiatric/Behavioral: Negative for behavioral problems, confusion and sleep disturbance.     Immunization History  Administered Date(s) Administered  . Tdap 08/05/2014   Pertinent  Health Maintenance Due  Topic Date Due  . INFLUENZA VACCINE  08/05/2017 (Originally 04/05/2017)  . DEXA SCAN  08/05/2017 (Originally 09/20/1990)  . PNA vac Low Risk Adult (1 of 2 - PCV13) 08/05/2017 (Originally 09/20/1990)   No flowsheet data found. Functional Status Survey:    Vitals:   05/16/17 1018  BP: (!) 101/53  Pulse: 72  Resp: 20  Temp: 97.7 F (36.5 C)  TempSrc: Oral  SpO2: 94%   There is no height or weight on file to calculate BMI. Physical Exam  Constitutional: She appears well-developed and well-nourished.  HENT:  Head: Normocephalic.  Mouth/Throat: Oropharynx is clear and  moist.  Eyes: Pupils are equal, round, and reactive to light.  Neck: Neck supple.  Cardiovascular: Normal rate and normal heart sounds.   No murmur heard. Pulmonary/Chest: Effort normal and breath sounds normal. No respiratory distress. She has no wheezes. She has no rales.  Abdominal: Soft. Bowel sounds are normal. She exhibits no distension. There is no tenderness. There is no rebound.  Musculoskeletal: She exhibits no edema.  Neurological: She is alert.  Follows simple commands No Focal deficits. Patient was not oriented to Place or time.  Skin: Skin is warm and dry.  Psychiatric: She has a normal mood and affect. Her behavior is normal. Thought content normal.    Labs reviewed:  Recent Labs  05/15/17 0600  NA 138  K 5.5*  CL 104  CO2 25  GLUCOSE 92  BUN 50*  CREATININE 2.21*  CALCIUM 8.8*    Recent Labs  05/15/17 0600  AST 17  ALT 11*  ALKPHOS 72  BILITOT 0.3  PROT 6.8  ALBUMIN 3.3*    Recent Labs  11/17/16 0700 05/15/17 0600  WBC 7.1 7.0  NEUTROABS  --  4.1  HGB 9.8* 10.5*  HCT 30.4* 32.8*  MCV 83.5  83.7  PLT 294 240   No results found for: TSH No results found for: HGBA1C No results found for: CHOL, HDL, LDLCALC, LDLDIRECT, TRIG, CHOLHDL  Significant Diagnostic Results in last 30 days:  No results found.  Assessment/Plan  Hyperkalemia Reviewed her Med list . Her Hyperkalemia is probably due to Worsening renal function. Will give her One dose of Kayexalate Low Potassium diet. Repeat labs in 1 week and see if she needs it on chronic Basis.  Chronic kidney disease, stage V  Continue to monitor. Possible renal consult Will d/w family if they would be interested with her age.and frailty.  Acute cystitis without hematuria On Cipro   Iron deficiency anemia,  Hgb Stable on Iron supplement  Essential hypertension Her BP is consistently low Will decrease the dose of Norvasc Continue beta blocker for now  Cough  She is on Antibiotics and  Claritin.     Family/ staff Communication:   Labs/tests ordered:  BMP in 1 week. Total time spent in this patient care encounter was _45 minutes; greater than 50% of the visit spent counseling patient, reviewing records , Labs and coordinating care for problems addressed at this encounter.

## 2017-05-23 ENCOUNTER — Encounter (HOSPITAL_COMMUNITY)
Admission: RE | Admit: 2017-05-23 | Discharge: 2017-05-23 | Disposition: A | Discharge status: Home / Self Care | Payer: Medicare HMO | Source: Skilled Nursing Facility | Attending: Internal Medicine | Admitting: Internal Medicine

## 2017-05-23 DIAGNOSIS — N189 Chronic kidney disease, unspecified: Secondary | ICD-10-CM | POA: Diagnosis not present

## 2017-05-23 LAB — BASIC METABOLIC PANEL
ANION GAP: 10 (ref 5–15)
BUN: 38 mg/dL — ABNORMAL HIGH (ref 6–20)
CHLORIDE: 105 mmol/L (ref 101–111)
CO2: 23 mmol/L (ref 22–32)
Calcium: 9.1 mg/dL (ref 8.9–10.3)
Creatinine, Ser: 1.96 mg/dL — ABNORMAL HIGH (ref 0.44–1.00)
GFR, EST AFRICAN AMERICAN: 25 mL/min — AB (ref >60)
GFR, EST NON AFRICAN AMERICAN: 21 mL/min — AB (ref >60)
Glucose, Bld: 98 mg/dL (ref 65–99)
POTASSIUM: 4.4 mmol/L (ref 3.5–5.1)
Sodium: 138 mmol/L (ref 135–145)

## 2017-05-31 DIAGNOSIS — M79675 Pain in left toe(s): Secondary | ICD-10-CM | POA: Diagnosis not present

## 2017-05-31 DIAGNOSIS — B351 Tinea unguium: Secondary | ICD-10-CM | POA: Diagnosis not present

## 2017-05-31 DIAGNOSIS — M79674 Pain in right toe(s): Secondary | ICD-10-CM | POA: Diagnosis not present

## 2017-05-31 DIAGNOSIS — M2011 Hallux valgus (acquired), right foot: Secondary | ICD-10-CM | POA: Diagnosis not present

## 2017-06-05 ENCOUNTER — Non-Acute Institutional Stay (SKILLED_NURSING_FACILITY): Payer: Medicare HMO

## 2017-06-05 DIAGNOSIS — Z0000 Encounter for general adult medical examination without abnormal findings: Secondary | ICD-10-CM

## 2017-06-05 NOTE — Patient Instructions (Signed)
Alicia Guerra , Thank you for taking time to come for your Medicare Wellness Visit. I appreciate your ongoing commitment to your health goals. Please review the following plan we discussed and let me know if I can assist you in the future.   Screening recommendations/referrals: Colonoscopy excluded, pt over age 81 Mammogram excluded, pt over age 82 Bone Density due Recommended yearly ophthalmology/optometry visit for glaucoma screening and checkup Recommended yearly dental visit for hygiene and checkup  Vaccinations: Influenza vaccine due Pneumococcal vaccine up to date. Pna 13 due 06/13/2017 Tdap vaccine up to date. Due 08/05/2024 Shingles vaccine not in records    Advanced directives: need a copy for chart  Conditions/risks identified: none  Next appointment: Dr Lyndel Safe makes rounds   Preventive Care 65 Years and Older, Female Preventive care refers to lifestyle choices and visits with your health care provider that can promote health and wellness. What does preventive care include?  A yearly physical exam. This is also called an annual well check.  Dental exams once or twice a year.  Routine eye exams. Ask your health care provider how often you should have your eyes checked.  Personal lifestyle choices, including:  Daily care of your teeth and gums.  Regular physical activity.  Eating a healthy diet.  Avoiding tobacco and drug use.  Limiting alcohol use.  Practicing safe sex.  Taking low-dose aspirin every day.  Taking vitamin and mineral supplements as recommended by your health care provider. What happens during an annual well check? The services and screenings done by your health care provider during your annual well check will depend on your age, overall health, lifestyle risk factors, and family history of disease. Counseling  Your health care provider may ask you questions about your:  Alcohol use.  Tobacco use.  Drug use.  Emotional  well-being.  Home and relationship well-being.  Sexual activity.  Eating habits.  History of falls.  Memory and ability to understand (cognition).  Work and work Statistician.  Reproductive health. Screening  You may have the following tests or measurements:  Height, weight, and BMI.  Blood pressure.  Lipid and cholesterol levels. These may be checked every 5 years, or more frequently if you are over 80 years old.  Skin check.  Lung cancer screening. You may have this screening every year starting at age 7 if you have a 30-pack-year history of smoking and currently smoke or have quit within the past 15 years.  Fecal occult blood test (FOBT) of the stool. You may have this test every year starting at age 89.  Flexible sigmoidoscopy or colonoscopy. You may have a sigmoidoscopy every 5 years or a colonoscopy every 10 years starting at age 68.  Hepatitis C blood test.  Hepatitis B blood test.  Sexually transmitted disease (STD) testing.  Diabetes screening. This is done by checking your blood sugar (glucose) after you have not eaten for a while (fasting). You may have this done every 1-3 years.  Bone density scan. This is done to screen for osteoporosis. You may have this done starting at age 47.  Mammogram. This may be done every 1-2 years. Talk to your health care provider about how often you should have regular mammograms. Talk with your health care provider about your test results, treatment options, and if necessary, the need for more tests. Vaccines  Your health care provider may recommend certain vaccines, such as:  Influenza vaccine. This is recommended every year.  Tetanus, diphtheria, and acellular pertussis (Tdap, Td)  vaccine. You may need a Td booster every 10 years.  Zoster vaccine. You may need this after age 42.  Pneumococcal 13-valent conjugate (PCV13) vaccine. One dose is recommended after age 4.  Pneumococcal polysaccharide (PPSV23) vaccine. One  dose is recommended after age 2. Talk to your health care provider about which screenings and vaccines you need and how often you need them. This information is not intended to replace advice given to you by your health care provider. Make sure you discuss any questions you have with your health care provider. Document Released: 09/18/2015 Document Revised: 05/11/2016 Document Reviewed: 06/23/2015 Elsevier Interactive Patient Education  2017 [employer] Prevention in the Home Falls can cause injuries. They can happen to people of all ages. There are many things you can do to make your home safe and to help prevent falls. What can I do on the outside of my home?  Regularly fix the edges of walkways and driveways and fix any cracks.  Remove anything that might make you trip as you walk through a door, such as a raised step or threshold.  Trim any bushes or trees on the path to your home.  Use bright outdoor lighting.  Clear any walking paths of anything that might make someone trip, such as rocks or tools.  Regularly check to see if handrails are loose or broken. Make sure that both sides of any steps have handrails.  Any raised decks and porches should have guardrails on the edges.  Have any leaves, snow, or ice cleared regularly.  Use sand or salt on walking paths during winter.  Clean up any spills in your garage right away. This includes oil or grease spills. What can I do in the bathroom?  Use night lights.  Install grab bars by the toilet and in the tub and shower. Do not use towel bars as grab bars.  Use non-skid mats or decals in the tub or shower.  If you need to sit down in the shower, use a plastic, non-slip stool.  Keep the floor dry. Clean up any water that spills on the floor as soon as it happens.  Remove soap buildup in the tub or shower regularly.  Attach bath mats securely with double-sided non-slip rug tape.  Do not have throw rugs and other  things on the floor that can make you trip. What can I do in the bedroom?  Use night lights.  Make sure that you have a light by your bed that is easy to reach.  Do not use any sheets or blankets that are too big for your bed. They should not hang down onto the floor.  Have a firm chair that has side arms. You can use this for support while you get dressed.  Do not have throw rugs and other things on the floor that can make you trip. What can I do in the kitchen?  Clean up any spills right away.  Avoid walking on wet floors.  Keep items that you use a lot in easy-to-reach places.  If you need to reach something above you, use a strong step stool that has a grab bar.  Keep electrical cords out of the way.  Do not use floor polish or wax that makes floors slippery. If you must use wax, use non-skid floor wax.  Do not have throw rugs and other things on the floor that can make you trip. What can I do with my stairs?  Do not leave  any items on the stairs.  Make sure that there are handrails on both sides of the stairs and use them. Fix handrails that are broken or loose. Make sure that handrails are as long as the stairways.  Check any carpeting to make sure that it is firmly attached to the stairs. Fix any carpet that is loose or worn.  Avoid having throw rugs at the top or bottom of the stairs. If you do have throw rugs, attach them to the floor with carpet tape.  Make sure that you have a light switch at the top of the stairs and the bottom of the stairs. If you do not have them, ask someone to add them for you. What else can I do to help prevent falls?  Wear shoes that:  Do not have high heels.  Have rubber bottoms.  Are comfortable and fit you well.  Are closed at the toe. Do not wear sandals.  If you use a stepladder:  Make sure that it is fully opened. Do not climb a closed stepladder.  Make sure that both sides of the stepladder are locked into place.  Ask  someone to hold it for you, if possible.  Clearly mark and make sure that you can see:  Any grab bars or handrails.  First and last steps.  Where the edge of each step is.  Use tools that help you move around (mobility aids) if they are needed. These include:  Canes.  Walkers.  Scooters.  Crutches.  Turn on the lights when you go into a dark area. Replace any light bulbs as soon as they burn out.  Set up your furniture so you have a clear path. Avoid moving your furniture around.  If any of your floors are uneven, fix them.  If there are any pets around you, be aware of where they are.  Review your medicines with your doctor. Some medicines can make you feel dizzy. This can increase your chance of falling. Ask your doctor what other things that you can do to help prevent falls. This information is not intended to replace advice given to you by your health care provider. Make sure you discuss any questions you have with your health care provider. Document Released: 06/18/2009 Document Revised: 01/28/2016 Document Reviewed: 09/26/2014 Elsevier Interactive Patient Education  2017 [employer].

## 2017-06-05 NOTE — Progress Notes (Signed)
Subjective:   Alicia Guerra is a 83 y.o. female who presents for an Initial Medicare Annual Wellness Visit at [facility]       Objective:    Today's Vitals   06/05/17 1530  BP: 115/70  Pulse: 65  Temp: 98 F (36.7 C)  TempSrc: Oral  SpO2: 95%  Weight: 101 lb (45.8 kg)  Height: 4\' 11"  (1.499 m)   Body mass index is 20.4 kg/m.   Current Medications (verified) Outpatient Encounter Prescriptions as of 06/05/2017  Medication Sig  . acetaminophen (TYLENOL) 325 MG tablet Take 650 mg by mouth every 6 (six) hours as needed.  Marland Kitchen amLODipine (NORVASC) 10 MG tablet Take 1 tablet (10 mg total) by mouth daily.  . ferrous sulfate 325 (65 FE) MG tablet Take 325 mg by mouth 2 (two) times daily with a meal.  . metoprolol (LOPRESSOR) 50 MG tablet Take 1 tablet (50 mg total) by mouth 2 (two) times daily.  . Nutritional Supplements (ENSURE ENLIVE PO) Give once day  . Polyethyl Glycol-Propyl Glycol (SYSTANE) 0.4-0.3 % GEL ophthalmic gel Place 1 application into both eyes 2 (two) times daily at 10 AM and 5 PM.  . [DISCONTINUED] ciprofloxacin (CIPRO) 250 MG tablet Take 250 mg by mouth 2 (two) times daily.  . [DISCONTINUED] loratadine (CLARITIN) 10 MG tablet Take 10 mg by mouth daily.  . [DISCONTINUED] Probiotic Product (RISA-BID PROBIOTIC) TABS Give 1 tablet by mouth twice a day   No facility-administered encounter medications on file as of 06/05/2017.     Allergies (verified) Patient has no known allergies.   History: Past Medical History:  Diagnosis Date  . Anemia   . Arthritis   . History of blood transfusion    "today is the first time" (08/05/2014)  . Hypertension    Past Surgical History:  Procedure Laterality Date  . APPENDECTOMY    . CATARACT EXTRACTION Left   . CHOLECYSTECTOMY    . COLONOSCOPY N/A 09/26/2014   Procedure: COLONOSCOPY;  Surgeon: [name], MD;  [facility];  Service: Endoscopy;  Laterality: N/A;  1030am  . CYSTECTOMY      "had cyst taken off lower back"  . ESOPHAGOGASTRODUODENOSCOPY N/A 08/22/2014   NWG:[id number] ring at the gastro junction/small HH  . ORIF FEMUR FRACTURE Left 08/07/2014   Procedure: OPEN REDUCTION INTERNAL FIXATION (ORIF)  LEFT FEMUR FRACTURE;  Surgeon: [name], MD;  Location: [place];  Service: Orthopedics;  Laterality: Left;  . TONSILLECTOMY     History reviewed. No pertinent family history. Social History   Occupational History  . Not on file.   Social History Main Topics  . Smoking status: Former Smoker    Packs/day: 0.25    Years: 16.00    Types: Cigarettes  . Smokeless tobacco: Never Used  . Alcohol use No  . Drug use: No  . Sexual activity: No    Tobacco Counseling Counseling given: Not Answered   Activities of Daily Living In your present state of health, do you have any difficulty performing the following activities: 06/05/2017  Hearing? Y  Vision? Y  Difficulty concentrating or making decisions? Y  Walking or climbing stairs? Y  Dressing or bathing? N  Doing errands, shopping? Y  Preparing Food and eating ? Y  Using the Toilet? Y  In the past six months, have you accidently leaked urine? Y  Do you have problems with loss of bowel control? Y  Managing your Medications? Darreld Mclean  Managing your Finances? Y  Housekeeping or managing your Housekeeping? Y  Some recent data might be hidden    Immunizations and Health Maintenance Immunization History  Administered Date(s) Administered  . Tdap 08/05/2014   There are no preventive care reminders to display for this patient.  Patient Care Team: Sinda Du, MD as PCP - General (Pulmonary Disease) [name], MD as Consulting Physician (Gastroenterology)  Indicate any recent Medical Services you may have received from other than [facility] providers in the past year (date may be approximate).     Assessment:   This is a routine wellness examination for Alicia Guerra.   Hearing/Vision screen No exam data  present  Dietary issues and exercise activities discussed: Current Exercise Habits: Structured exercise class, Time (Minutes): 30, Frequency (Times/Week): 7, Weekly Exercise (Minutes/Week): 210, Intensity: Mild, Exercise limited by: None identified  Goals    None     Depression Screen PHQ 2/9 Scores 06/05/2017  PHQ - 2 Score 0    Fall Risk Fall Risk  06/05/2017  Falls in the past year? Yes  Number falls in past yr: 1  Injury with Fall? Yes    Cognitive Function:     6CIT Screen 06/05/2017  What Year? 4 points  What month? 3 points  What time? 3 points  Count back from 20 4 points  Months in reverse 4 points  Repeat phrase 10 points  Total Score 28    Screening Tests Health Maintenance  Topic Date Due  . INFLUENZA VACCINE  08/05/2017 (Originally 04/05/2017)  . DEXA SCAN  08/05/2017 (Originally 09/20/1990)  . PNA vac Low Risk Adult (1 of 2 - PCV13) 08/05/2017 (Originally 09[birth year]20/1990)  . TETANUS/TDAP  08/05/2024      Plan:    I have personally reviewed and addressed the Medicare Annual Wellness questionnaire and have noted the following in the patient's chart:  A. Medical and social history B. Use of alcohol, tobacco or illicit drugs  C. Current medications and supplements D. Functional ability and status E.  Nutritional status F.  Physical activity G. Advance directives H. List of other physicians I.  Hospitalizations, surgeries, and ER visits in previous 12 months J.  [place] to include hearing, vision, cognitive, depression L. Referrals and appointments - none  In addition, I have reviewed and discussed with patient certain preventive protocols, quality metrics, and best practice recommendations. A written personalized care plan for preventive services as well as general preventive health recommendations were provided to patient.  See attached scanned questionnaire for additional information.   Signed,   Rich Reining, RN Nurse Health Advisor    Quick Notes   Health Maintenance: DEXA and flu vaccine due     Abnormal Screen: 6CIT-28     Patient Concerns: None     Nurse Concerns: None

## 2017-06-20 ENCOUNTER — Non-Acute Institutional Stay (SKILLED_NURSING_FACILITY): Payer: Medicare HMO | Admitting: Internal Medicine

## 2017-06-20 ENCOUNTER — Encounter: Payer: Self-pay | Admitting: Internal Medicine

## 2017-06-20 DIAGNOSIS — N185 Chronic kidney disease, stage 5: Secondary | ICD-10-CM

## 2017-06-20 DIAGNOSIS — E875 Hyperkalemia: Secondary | ICD-10-CM | POA: Diagnosis not present

## 2017-06-20 DIAGNOSIS — C189 Malignant neoplasm of colon, unspecified: Secondary | ICD-10-CM | POA: Diagnosis not present

## 2017-06-20 DIAGNOSIS — D509 Iron deficiency anemia, unspecified: Secondary | ICD-10-CM

## 2017-06-20 DIAGNOSIS — I10 Essential (primary) hypertension: Secondary | ICD-10-CM

## 2017-06-20 NOTE — Progress Notes (Signed)
Location:   [place] Room Number: 113/D Place of Service:  SNF (31) Provider:  Terisa Starr, MD  Patient Care Team: Sinda Du, MD as PCP - General (Pulmonary Disease) [name], MD as Consulting Physician (Gastroenterology)  Extended Emergency Contact Information Primary Emergency Contact: [name] of [place] Phone: [phone] Mobile Phone: [phone] Relation: Niece Secondary Emergency Contact: [place] Phone: [phone] Relation: Neighbor  Code Status:  Full Code Goals of care: Advanced Directive information Advanced Directives 06/20/2017  Does Patient Have a Medical Advance Directive? Yes  Type of Advance Directive (No Data)  Does patient want to make changes to medical advance directive? No - Patient declined  Copy of [place] in Chart? No - copy requested  Would patient like information on creating a medical advance directive? -     Chief Complaint  Patient presents with  . Medical Management of Chronic Issues    Routine Visit  For medical management of chronic medical conditions including chronic kidney disease with hyperkalemia-iron deficiency anemia-hypertension-  HPI:  Pt is a 83 y.o. female seen today for medical management of chronic diseases. As noted above-she continues to be quite stable per nursing most recent acute issue was elevated potassium and she did receive a dose of Kayexalate and potassium normalized at 4.4 she does have a history of chronic kidney disease with baseline creatinine around 2-  She also has a history of iron deficiency anemia she is on iron and hemoglobin has shown stability actually improvement at 10.5 on lab done in mid September 2018.  She does have a history of hypertension at one time she was hypotensive and Norvasc dose was decreased she is on metoprolol 50 mg twice a day appears at times  she does have some elevations I got a systolic in the 578I today machine did give a similar reading I see a previous systolic in the 696E-XB appears she does have somewhat more consistent elevations now-.  I suspect we will go up on her Norvasc I did discuss this with Dr. Lyndel Safe and will increase this slightly and monitor.  She also has a distant history of colon cancer at one time had a colectomy and ileostomy-per review previous notes family does not wish for aggressive workup or follow-up.  She also has a history of allergic rhinitis and did receive Claritin recently and this resolved fairly unremarkably.  Her weight has been stable nursing staff does not report any concerns patient has no complaints-she is very quiet and does not speak much but when asked says she is not having any discomfort or has any issues     Past Medical History:  Diagnosis Date  . Anemia   . Arthritis   . History of blood transfusion    "today is the first time" (08/05/2014)  . Hypertension    Past Surgical History:  Procedure Laterality Date  . APPENDECTOMY    . CATARACT EXTRACTION Left   . CHOLECYSTECTOMY    . COLONOSCOPY N/A 09/26/2014   Procedure: COLONOSCOPY;  Surgeon: [name], MD;  [facility];  Service: Endoscopy;  Laterality: N/A;  1030am  . CYSTECTOMY     "had cyst taken off lower back"  . ESOPHAGOGASTRODUODENOSCOPY N/A 08/22/2014   [id number] ring at the gastro junction/small HH  . ORIF FEMUR FRACTURE Left 08/07/2014   Procedure: OPEN REDUCTION INTERNAL FIXATION (ORIF)  LEFT FEMUR FRACTURE;  Surgeon: Christia Reading  Maryla Morrow, MD;  Location: [place];  Service: Orthopedics;  Laterality: Left;  . TONSILLECTOMY      No Known Allergies  Outpatient Encounter Prescriptions as of 06/20/2017  Medication Sig  . acetaminophen (TYLENOL) 325 MG tablet Take 650 mg by mouth every 6 (six) hours as needed.  Marland Kitchen amLODipine (NORVASC) 5 MG tablet Take 5 mg by mouth daily.  . ferrous sulfate 325 (65  FE) MG tablet Take 325 mg by mouth 2 (two) times daily with a meal.  . metoprolol (LOPRESSOR) 50 MG tablet Take 1 tablet (50 mg total) by mouth 2 (two) times daily.  . Nutritional Supplements (ENSURE ENLIVE PO) Give once day  . Polyethyl Glycol-Propyl Glycol (SYSTANE) 0.4-0.3 % GEL ophthalmic gel Place 1 application into both eyes 2 (two) times daily at 10 AM and 5 PM.  . [DISCONTINUED] amLODipine (NORVASC) 10 MG tablet Take 1 tablet (10 mg total) by mouth daily.   No facility-administered encounter medications on file as of 06/20/2017.      Review of Systems   Provided by nursing as well as resident resident does not speak much so limited.  In general no complaints of fever chills weight has been stable.  Skin does not complain of any rashes or itching.  Head ears eyes nose mouth and throat no complaints of visual changes sore throat or difficulty swallowing.  Respiratory denies shortness breath or cough.  Cardiac denies chest pain or significant lower extremity edema.  GI does not complaining of abdominal discomfort nausea vomiting diarrhea constipation.  Muscle skeletal is not complaining of any joint pain at this time or swelling.  Neurologic does not complain of dizziness syncope headache or numbness.   psych no behaviors have been noted she does not complain of feeling depressed continues to be pleasant smiling but does not speak much  Immunization History  Administered Date(s) Administered  . Influenza-Unspecified 06/09/2017  . Pneumococcal Polysaccharide-23 06/13/2016  . Tdap 08/05/2014, 05/18/2017   Pertinent  Health Maintenance Due  Topic Date Due  . DEXA SCAN  08/05/2017 (Originally 09/20/1990)  . PNA vac Low Risk Adult (2 of 2 - PCV13) 08/05/2017 (Originally 06/13/2017)  . INFLUENZA VACCINE  Completed   Fall Risk  06/05/2017  Falls in the past year? Yes  Number falls in past yr: 1  Injury with Fall? Yes   Functional Status Survey:    Vitals:   06/20/17  1402  BP: (!) 178/69  Pulse: 65  Resp: 20  Temp: (!) 97.1 F (36.2 C)  TempSrc: Oral  SpO2: 94%  Weight: 110 lb (49.9 kg)   Body mass index is 22.22 kg/m. Physical Exam In general this is a pleasant elderly female in no distress continues to sit comfortably in her wheelchair.  Her skin is warm and dry.  Eyes pupils appear reactive light sclera and conjunctiva are clear I do not note any drainage.  Oropharynx is clear mucous membranes moist.  Chest is clear to auscultation there is no labored breathing.  Heart is regular rate and rhythm without murmur gallop or rub she does not really have any significant lower extremity edema.  Her abdomen soft nontender with positive bowel sounds.  Musculoskeletal is able to move all extremities 4 --largely amputation a wheelchair I do not note any deformities other than arthritic.  Neurologic as noted above no lateralizing findings her speech is clear.  Psych she again does not speak much is oriented to self follows verbal commands without difficulty she is able to  give a fairly accurate history at times.   Labs reviewed:  Recent Labs  05/15/17 0600 05/23/17 0700  NA 138 138  K 5.5* 4.4  CL 104 105  CO2 25 23  GLUCOSE 92 98  BUN 50* 38*  CREATININE 2.21* 1.96*  CALCIUM 8.8* 9.1    Recent Labs  05/15/17 0600  AST 17  ALT 11*  ALKPHOS 72  BILITOT 0.3  PROT 6.8  ALBUMIN 3.3*    Recent Labs  11/17/16 0700 05/15/17 0600  WBC 7.1 7.0  NEUTROABS  --  4.1  HGB 9.8* 10.5*  HCT 30.4* 32.8*  MCV 83.5 83.7  PLT 294 240   No results found for: TSH No results found for: HGBA1C No results found for: CHOL, HDL, LDLCALC, LDLDIRECT, TRIG, CHOLHDL  Significant Diagnostic Results in last 30 days:  No results found.  Assessment/Plan  #1-history of hyperkalemia again she did receive a dose of Kayexalate last month-potassium did normalize will update a metabolic panel.  #2 history of chronic kidney disease again I  suspect this led to the mild hyperkalemia creatinine of 1.96 BUN of 38 on most recent lab appears baseline Will update this also again would like to keep an eye on her potassium.  #3 history of iron deficiency anemia she is on iron hemoglobin recently shown some improvement 10.5 will monitor this periodically.  #4 hypertension-at one time she was hypotensive and Norvasc was decreased however she appears to be having some fairly consistent elevations now this was discussed with Dr. Lyndel Safe and will increase Norvasc up to 7.5 mg a day and check her blood pressures daily.  #5-history of colon cancer again no aggressive workup desired by family clinically she appears to be stable hemoglobin is been stable to actually somewhat improved.  [id number]

## 2017-06-21 ENCOUNTER — Encounter (HOSPITAL_COMMUNITY)
Admission: RE | Admit: 2017-06-21 | Discharge: 2017-06-21 | Disposition: A | Discharge status: Home / Self Care | Payer: Medicare HMO | Source: Skilled Nursing Facility | Attending: Internal Medicine | Admitting: Internal Medicine

## 2017-06-21 DIAGNOSIS — N390 Urinary tract infection, site not specified: Secondary | ICD-10-CM | POA: Insufficient documentation

## 2017-06-21 DIAGNOSIS — N189 Chronic kidney disease, unspecified: Secondary | ICD-10-CM | POA: Diagnosis not present

## 2017-06-21 DIAGNOSIS — E876 Hypokalemia: Secondary | ICD-10-CM | POA: Diagnosis not present

## 2017-06-21 DIAGNOSIS — R488 Other symbolic dysfunctions: Secondary | ICD-10-CM | POA: Diagnosis not present

## 2017-06-21 DIAGNOSIS — B9629 Other Escherichia coli [E. coli] as the cause of diseases classified elsewhere: Secondary | ICD-10-CM | POA: Insufficient documentation

## 2017-06-21 LAB — BASIC METABOLIC PANEL
Anion gap: 11 (ref 5–15)
BUN: 34 mg/dL — AB (ref 6–20)
CHLORIDE: 103 mmol/L (ref 101–111)
CO2: 24 mmol/L (ref 22–32)
CREATININE: 1.83 mg/dL — AB (ref 0.44–1.00)
Calcium: 9.2 mg/dL (ref 8.9–10.3)
GFR calc Af Amer: 27 mL/min — ABNORMAL LOW (ref >60)
GFR calc non Af Amer: 23 mL/min — ABNORMAL LOW (ref >60)
Glucose, Bld: 106 mg/dL — ABNORMAL HIGH (ref 65–99)
Potassium: 4.5 mmol/L (ref 3.5–5.1)
Sodium: 138 mmol/L (ref 135–145)

## 2017-06-22 DIAGNOSIS — M810 Age-related osteoporosis without current pathological fracture: Secondary | ICD-10-CM | POA: Diagnosis not present

## 2017-06-27 DIAGNOSIS — H11043 Peripheral pterygium, stationary, bilateral: Secondary | ICD-10-CM | POA: Diagnosis not present

## 2017-06-27 DIAGNOSIS — H04123 Dry eye syndrome of bilateral lacrimal glands: Secondary | ICD-10-CM | POA: Diagnosis not present

## 2017-06-27 DIAGNOSIS — Z961 Presence of intraocular lens: Secondary | ICD-10-CM | POA: Diagnosis not present

## 2017-06-27 DIAGNOSIS — H2511 Age-related nuclear cataract, right eye: Secondary | ICD-10-CM | POA: Diagnosis not present

## 2017-07-31 ENCOUNTER — Encounter: Payer: Self-pay | Admitting: Internal Medicine

## 2017-07-31 ENCOUNTER — Non-Acute Institutional Stay (SKILLED_NURSING_FACILITY): Payer: Medicare HMO | Admitting: Internal Medicine

## 2017-07-31 DIAGNOSIS — D509 Iron deficiency anemia, unspecified: Secondary | ICD-10-CM | POA: Diagnosis not present

## 2017-07-31 DIAGNOSIS — N185 Chronic kidney disease, stage 5: Secondary | ICD-10-CM | POA: Diagnosis not present

## 2017-07-31 DIAGNOSIS — I10 Essential (primary) hypertension: Secondary | ICD-10-CM | POA: Diagnosis not present

## 2017-07-31 DIAGNOSIS — M810 Age-related osteoporosis without current pathological fracture: Secondary | ICD-10-CM | POA: Diagnosis not present

## 2017-07-31 DIAGNOSIS — F0390 Unspecified dementia without behavioral disturbance: Secondary | ICD-10-CM | POA: Diagnosis not present

## 2017-07-31 NOTE — Progress Notes (Signed)
Location:   [place] Room Number: 113/D Place of Service:  SNF (31) Provider:  Quintella Baton, MD  Patient Care Team: Sinda Du, MD as PCP - General (Pulmonary Disease) [name], MD as Consulting Physician (Gastroenterology)  Extended Emergency Contact Information Primary Emergency Contact: [name] of [place] Phone: [phone] Mobile Phone: [phone] Relation: Niece Secondary Emergency Contact: [place] of [place] Phone: [phone] Relation: Neighbor  Code Status:  Full Code Goals of care: Advanced Directive information Advanced Directives 07/31/2017  Does Patient Have a Medical Advance Directive? Yes  Type of Advance Directive (No Data)  Does patient want to make changes to medical advance directive? No - Patient declined  Copy of [place] in Chart? No - copy requested  Would patient like information on creating a medical advance directive? -     Chief Complaint  Patient presents with  . Medical Management of Chronic Issues    Routine Visit, Due Prevnar-13    HPI:  Pt is a 83 y.o. female seen today for medical management of chronic diseases.   Patient has h/o Hypertension, Colorectal cancer s/p Right Colectomy, Anemia, And Chronic renal disease.  Patient seen today for Routine visit. She denied any complains. D/W nurses and she is not having any new Nursing issues. Her weight is stable at 110-112 lbs. Her appetite is good.    Past Medical History:  Diagnosis Date  . Anemia   . Arthritis   . History of blood transfusion    "today is the first time" (08/05/2014)  . Hypertension    Past Surgical History:  Procedure Laterality Date  . APPENDECTOMY    . CATARACT EXTRACTION Left   . CHOLECYSTECTOMY    . COLONOSCOPY N/A 09/26/2014   Procedure: COLONOSCOPY;  Surgeon: [name], MD;  [facility];  Service:  Endoscopy;  Laterality: N/A;  1030am  . CYSTECTOMY     "had cyst taken off lower back"  . ESOPHAGOGASTRODUODENOSCOPY N/A 08/22/2014   DXA:JOINOMVE ring at the gastro junction/small HH  . ORIF FEMUR FRACTURE Left 08/07/2014   Procedure: OPEN REDUCTION INTERNAL FIXATION (ORIF)  LEFT FEMUR FRACTURE;  Surgeon: [name], MD;  Location: [facility];  Service: Orthopedics;  Laterality: Left;  . TONSILLECTOMY      No Known Allergies  Outpatient Encounter Medications as of 07/31/2017  Medication Sig  . acetaminophen (TYLENOL) 325 MG tablet Take 650 mg by mouth every 6 (six) hours as needed.  Marland Kitchen amLODipine (NORVASC) 5 MG tablet Take 7.5 mg by mouth daily. Give 1.5 tab to =7.5 mg once a day  . ferrous sulfate 325 (65 FE) MG tablet Take 325 mg by mouth 2 (two) times daily with a meal.  . metoprolol (LOPRESSOR) 50 MG tablet Take 1 tablet (50 mg total) by mouth 2 (two) times daily.  . Nutritional Supplements (ENSURE ENLIVE PO) Give once day  . Polyethyl Glycol-Propyl Glycol (SYSTANE) 0.4-0.3 % GEL ophthalmic gel Place 1 application into both eyes 2 (two) times daily at 10 AM and 5 PM.   No facility-administered encounter medications on file as of 07/31/2017.      Review of Systems  Review of Systems  Constitutional: Negative for activity change, appetite change, chills, diaphoresis, fatigue and fever.  HENT: Negative for mouth sores, postnasal drip, rhinorrhea, sinus pain and sore throat.   Respiratory: Negative for apnea, cough, chest tightness, shortness of breath and wheezing.  Cardiovascular: Negative for chest pain, palpitations and leg swelling.  Gastrointestinal: Negative for abdominal distention, abdominal pain, constipation, diarrhea, nausea and vomiting.  Genitourinary: Negative for dysuria and frequency.  Musculoskeletal: Negative for arthralgias, joint swelling and myalgias.  Skin: Negative for rash.  Neurological: Negative for dizziness, syncope, weakness, light-headedness and  numbness.  Psychiatric/Behavioral: Negative for behavioral problems, confusion and sleep disturbance.     Immunization History  Administered Date(s) Administered  . Influenza-Unspecified 06/09/2017  . Pneumococcal Polysaccharide-23 06/13/2016  . Tdap 08/05/2014, 05/18/2017   Pertinent  Health Maintenance Due  Topic Date Due  . PNA vac Low Risk Adult (2 of 2 - PCV13) 08/05/2017 (Originally 06/13/2017)  . INFLUENZA VACCINE  Completed  . DEXA SCAN  Completed   Fall Risk  06/05/2017  Falls in the past year? Yes  Number falls in past yr: 1  Injury with Fall? Yes   Functional Status Survey:    Vitals:   07/31/17 1110  BP: (!) 115/58  Pulse: (!) 58  Resp: 18  Temp: 97.9 F (36.6 C)  TempSrc: Oral  SpO2: 94%  Weight: 110 lb 3.2 oz (50 kg)   Body mass index is 22.26 kg/m.   Physical Exam  Constitutional: She is well-developed, well-nourished, and in no distress.  HENT:  Head: Normocephalic.  Mouth/Throat: Oropharynx is clear and moist.  Eyes: Pupils are equal, round, and reactive to light.  Neck: Neck supple.  Cardiovascular: Normal rate and normal heart sounds.  No murmur heard. Pulmonary/Chest: Effort normal and breath sounds normal. No respiratory distress. She has no wheezes. She has no rales.  Abdominal: Soft. Bowel sounds are normal. She exhibits no distension. There is no tenderness. There is no rebound.  Musculoskeletal: She exhibits no edema.  Lymphadenopathy:    She has no cervical adenopathy.  Neurological: She is alert.  Follows commands but not Oreinted to time and Place.  Skin: Skin is warm and dry.  Psychiatric: Affect and judgment normal.        Labs reviewed: Recent Labs    05/15/17 0600 05/23/17 0700 06/21/17 0726  NA 138 138 138  K 5.5* 4.4 4.5  CL 104 105 103  CO2 25 23 24   GLUCOSE 92 98 106*  BUN 50* 38* 34*  CREATININE 2.21* 1.96* 1.83*  CALCIUM 8.8* 9.1 9.2   Recent Labs    05/15/17 0600  AST 17  ALT 11*  ALKPHOS 72    BILITOT 0.3  PROT 6.8  ALBUMIN 3.3*   Recent Labs    11/17/16 0700 05/15/17 0600  WBC 7.1 7.0  NEUTROABS  --  4.1  HGB 9.8* 10.5*  HCT 30.4* 32.8*  MCV 83.5 83.7  PLT 294 240   No results found for: TSH No results found for: HGBA1C No results found for: CHOL, HDL, LDLCALC, LDLDIRECT, TRIG, CHOLHDL  Significant Diagnostic Results in last 30 days:  No results found.  Assessment/Plan  Osteoporosis With Tscore of -2.6 with h/o Fracture .in CKD Stage 5  Will get Calcium level, Vit D level and TSH . Also Serum phosphorus level.  Essential hypertension BP Controlled on Norvasc and Metoprolol.   Chronic kidney disease, stage V with Hyperkalemia Stable Creat. Repeat BMP to follow Potassium  Anemia Due to Chronic Renal disease  On Iron supplement.   Dementia Continue supportive care. Not on Any treatment.    Family/ staff Communication:   Labs/tests ordered:

## 2017-08-01 ENCOUNTER — Encounter (HOSPITAL_COMMUNITY)
Admission: RE | Admit: 2017-08-01 | Discharge: 2017-08-01 | Disposition: A | Discharge status: Home / Self Care | Payer: Medicare HMO | Source: Skilled Nursing Facility | Attending: Internal Medicine | Admitting: Internal Medicine

## 2017-08-01 DIAGNOSIS — F5089 Other specified eating disorder: Secondary | ICD-10-CM | POA: Diagnosis not present

## 2017-08-01 DIAGNOSIS — E876 Hypokalemia: Secondary | ICD-10-CM | POA: Diagnosis not present

## 2017-08-01 DIAGNOSIS — N189 Chronic kidney disease, unspecified: Secondary | ICD-10-CM | POA: Diagnosis not present

## 2017-08-01 DIAGNOSIS — D649 Anemia, unspecified: Secondary | ICD-10-CM | POA: Insufficient documentation

## 2017-08-01 DIAGNOSIS — M159 Polyosteoarthritis, unspecified: Secondary | ICD-10-CM | POA: Insufficient documentation

## 2017-08-01 DIAGNOSIS — M6281 Muscle weakness (generalized): Secondary | ICD-10-CM | POA: Insufficient documentation

## 2017-08-01 LAB — COMPREHENSIVE METABOLIC PANEL
ALT: 13 U/L — ABNORMAL LOW (ref 14–54)
AST: 19 U/L (ref 15–41)
Albumin: 3.9 g/dL (ref 3.5–5.0)
Alkaline Phosphatase: 89 U/L (ref 38–126)
Anion gap: 10 (ref 5–15)
BILIRUBIN TOTAL: 0.3 mg/dL (ref 0.3–1.2)
BUN: 40 mg/dL — AB (ref 6–20)
CALCIUM: 9.5 mg/dL (ref 8.9–10.3)
CO2: 22 mmol/L (ref 22–32)
CREATININE: 1.96 mg/dL — AB (ref 0.44–1.00)
Chloride: 104 mmol/L (ref 101–111)
GFR calc Af Amer: 25 mL/min — ABNORMAL LOW (ref >60)
GFR, EST NON AFRICAN AMERICAN: 21 mL/min — AB (ref >60)
Glucose, Bld: 104 mg/dL — ABNORMAL HIGH (ref 65–99)
POTASSIUM: 4.4 mmol/L (ref 3.5–5.1)
Sodium: 136 mmol/L (ref 135–145)
TOTAL PROTEIN: 7.2 g/dL (ref 6.5–8.1)

## 2017-08-01 LAB — PHOSPHORUS: PHOSPHORUS: 3.6 mg/dL (ref 2.5–4.6)

## 2017-08-01 LAB — TSH: TSH: 2.515 u[IU]/mL (ref 0.350–4.500)

## 2017-08-02 LAB — VITAMIN D 25 HYDROXY (VIT D DEFICIENCY, FRACTURES): VIT D 25 HYDROXY: 32.2 ng/mL (ref 30.0–100.0)

## 2017-09-04 ENCOUNTER — Encounter: Payer: Self-pay | Admitting: Internal Medicine

## 2017-09-04 ENCOUNTER — Non-Acute Institutional Stay (SKILLED_NURSING_FACILITY): Payer: Medicare HMO | Admitting: Internal Medicine

## 2017-09-04 DIAGNOSIS — M810 Age-related osteoporosis without current pathological fracture: Secondary | ICD-10-CM | POA: Diagnosis not present

## 2017-09-04 DIAGNOSIS — D509 Iron deficiency anemia, unspecified: Secondary | ICD-10-CM

## 2017-09-04 DIAGNOSIS — F0390 Unspecified dementia without behavioral disturbance: Secondary | ICD-10-CM

## 2017-09-04 DIAGNOSIS — I10 Essential (primary) hypertension: Secondary | ICD-10-CM

## 2017-09-04 DIAGNOSIS — N185 Chronic kidney disease, stage 5: Secondary | ICD-10-CM | POA: Diagnosis not present

## 2017-09-04 NOTE — Progress Notes (Signed)
Location:   [facility] Room Number: 113/D Place of Service:  SNF (31) Provider:  Murray Hodgkins, MD  Patient Care Team: Sinda Du, MD as PCP - General (Pulmonary Disease) [name], MD as Consulting Physician (Gastroenterology)  Extended Emergency Contact Information Primary Emergency Contact: [name] States of [place] Phone: [phone] Mobile Phone: [phone] Relation: Niece Secondary Emergency Contact: [place] of [place] Phone: [phone] Relation: Neighbor  Code Status:  Full Code Goals of care: Advanced Directive information Advanced Directives 09/04/2017  Does Patient Have a Medical Advance Directive? Yes  Type of Advance Directive (No Data)  Does patient want to make changes to medical advance directive? No - Patient declined  Copy of [place] in Chart? No - copy requested  Would patient like information on creating a medical advance directive? -     Chief Complaint  Patient presents with  . Medical Management of Chronic Issues    Routine Visit  For medical management of chronic medical conditions including dementia-hypertension-anemia-chronic renal disease- history of colorectal cancer.    HPI:  Pt is a 83 y.o. female seen today for medical management of chronic diseases.  As noted above-she continues to be quite stable nursing does not report any issues her weight is stable at around 110 pounds.   at one point her Norvasc was increased secondary to some elevated blood pressures this appears stable however blood pressure today 107/70 previously 128/59-143/64 I do not see consistent elevations.  She also has a history of chronic kidney disease which appears to be stable creatinine was 1.96 BUN 40 on lab done in late November we will update this since she does at times have a history of mild  hyperkalemia.  In regards to anemia she  does have a history of colorectal cancer status post colectomy in the past but hemoglobin has been stable   it was 10.5 back in September this will warrant an update.--She continues on iron  She also has a history of osteoporosis with a T score of -2.6 Dr.Gupta  did order a vitamin D level calcium as well as phosphorus levelta in addition to a TSH all of these have returned normal.  Regards to dementia she does well with supportive care she does not have any behaviors- she does not speak much but continues to smile and be cooperative   Past Medical History:  Diagnosis Date  . Anemia   . Arthritis   . History of blood transfusion    "today is the first time" (08/05/2014)  . Hypertension    Past Surgical History:  Procedure Laterality Date  . APPENDECTOMY    . CATARACT EXTRACTION Left   . CHOLECYSTECTOMY    . COLONOSCOPY N/A 09/26/2014   Procedure: COLONOSCOPY;  Surgeon: [name], MD;  [facility];  Service: Endoscopy;  Laterality: N/A;  1030am  . CYSTECTOMY     "had cyst taken off lower back"  . ESOPHAGOGASTRODUODENOSCOPY N/A 08/22/2014   HUT:MLYYTKPT ring at the gastro junction/small HH  . ORIF FEMUR FRACTURE Left 08/07/2014   Procedure: OPEN REDUCTION INTERNAL FIXATION (ORIF)  LEFT FEMUR FRACTURE;  Surgeon: [name], MD;  Location: [facility];  Service: Orthopedics;  Laterality: Left;  . TONSILLECTOMY      No Known Allergies  Outpatient Encounter Medications as of 09/04/2017  Medication Sig  . acetaminophen (TYLENOL) 325 MG tablet Take 650 mg by mouth every  6 (six) hours as needed.  Marland Kitchen amLODipine (NORVASC) 5 MG tablet Take 7.5 mg by mouth daily. Give 1.5 tab to =7.5 mg once a day  . ferrous sulfate 325 (65 FE) MG tablet Take 325 mg by mouth 2 (two) times daily with a meal.  . metoprolol (LOPRESSOR) 50 MG tablet Take 1 tablet (50 mg total) by mouth 2 (two) times daily.  . Nutritional Supplements (ENSURE ENLIVE PO) Give once day  . Polyethyl Glycol-Propyl  Glycol (SYSTANE) 0.4-0.3 % GEL ophthalmic gel Place 1 application into both eyes 2 (two) times daily at 10 AM and 5 PM.   No facility-administered encounter medications on file as of 09/04/2017.      Review of Systems essentially unobtainable secondary to dementia please see HPI     Immunization History  Administered Date(s) Administered  . Influenza-Unspecified 08/01/2017  . Pneumococcal Conjugate-13 08/01/2017  . Pneumococcal Polysaccharide-23 06/13/2016  . Tdap 08/05/2014, 05/18/2017   Pertinent  Health Maintenance Due  Topic Date Due  . INFLUENZA VACCINE  Completed  . DEXA SCAN  Completed  . PNA vac Low Risk Adult  Completed   Fall Risk  06/05/2017  Falls in the past year? Yes  Number falls in past yr: 1  Injury with Fall? Yes   Functional Status Survey:    Vitals:   09/04/17 1634  BP: 107/70  Pulse: 78  Resp: 18  Temp: 98.3 F (36.8 C)  TempSrc: Oral  Weight: 110 lb 12.8 oz (50.3 kg)  Height: 4\' 11"  (1.499 m)   Body mass index is 22.38 kg/m. Physical Exam  In general this is a pleasant elderly female in no distress sitting comfortably in her wheelchair.  Her skin is warm and dry I do note lower left back area she does have an elevated area that appears to be lipoma  like it is not erythematous or tender She has had this chronically with no change in characteristics.  Eyes pupils appear reactive to light her visual acuity appears intact.  Oropharynx is clear mucous membranes moist.  Chest is clear to auscultation there is no labored breathing.  Heart is regular rate and rhythm without murmur gallop or rub she does not have significant lower extremity edema.  Abdomen is soft nontender with positive bowel sounds.  Musculoskeletal moves all extremities x4 and ambulates in a wheelchair strength appears to be intact all 4 extremities-she does have arthritic changes of her hands.  Neurologic she is alert could not really appreciate lateralizing findings her  speech is clear but fairly minimal.  Psych she is oriented to self follow simple verbal commands she is pleasant and cooperative.     Labs reviewed: Recent Labs    05/23/17 0700 06/21/17 0726 08/01/17 0740  NA 138 138 136  K 4.4 4.5 4.4  CL 105 103 104  CO2 23 24 22   GLUCOSE 98 106* 104*  BUN 38* 34* 40*  CREATININE 1.96* 1.83* 1.96*  CALCIUM 9.1 9.2 9.5  PHOS  --   --  3.6   Recent Labs    05/15/17 0600 08/01/17 0740  AST 17 19  ALT 11* 13*  ALKPHOS 72 89  BILITOT 0.3 0.3  PROT 6.8 7.2  ALBUMIN 3.3* 3.9   Recent Labs    11/17/16 0700 05/15/17 0600  WBC 7.1 7.0  NEUTROABS  --  4.1  HGB 9.8* 10.5*  HCT 30.4* 32.8*  MCV 83.5 83.7  PLT 294 240   Lab Results  Component Value Date  TSH 2.515 08/01/2017   No results found for: HGBA1C No results found for: CHOL, HDL, LDLCALC, LDLDIRECT, TRIG, CHOLHDL  Significant Diagnostic Results in last 30 days:  No results found.  Assessment/Plan  #1-history of dementia she appears to be doing well with supportive care per nursing she has a decent appetite weight has been stable.  2.  Hypertension this appears stable as noted above she is on Norvasc as well as Lopressor.  3.  History of anemia will update a CBC hemoglobin was 10.5 back in September--which has shown stability will update this.--- She is on iron  4.-History of colorectal cancer hemoglobin appears to be stable and she has been stable for some time again will update hemoglobin level.  5.  History of chronic renal disease creatinine appears relatively stable at 1.96 with BUN of 40 will update this since she does have a history of mild hyperkalemia in the past.  6.  History of osteoporosis T score is -2.6 lab work including vitamin D-calcium phosphorus TSH have all been within normal limits   Again will update a CBC with her history of anemia and BMP with history of chronic renal disease.  [id number]

## 2017-09-05 ENCOUNTER — Encounter (HOSPITAL_COMMUNITY)
Admission: AD | Admit: 2017-09-05 | Discharge: 2017-09-05 | Disposition: A | Discharge status: Home / Self Care | Payer: Medicare HMO | Source: Skilled Nursing Facility | Attending: Internal Medicine | Admitting: Internal Medicine

## 2017-09-05 DIAGNOSIS — B9629 Other Escherichia coli [E. coli] as the cause of diseases classified elsewhere: Secondary | ICD-10-CM | POA: Insufficient documentation

## 2017-09-05 DIAGNOSIS — R488 Other symbolic dysfunctions: Secondary | ICD-10-CM | POA: Diagnosis not present

## 2017-09-05 DIAGNOSIS — E876 Hypokalemia: Secondary | ICD-10-CM | POA: Diagnosis not present

## 2017-09-05 DIAGNOSIS — N189 Chronic kidney disease, unspecified: Secondary | ICD-10-CM | POA: Insufficient documentation

## 2017-09-05 DIAGNOSIS — N390 Urinary tract infection, site not specified: Secondary | ICD-10-CM | POA: Insufficient documentation

## 2017-09-05 LAB — BASIC METABOLIC PANEL
ANION GAP: 11 (ref 5–15)
BUN: 46 mg/dL — AB (ref 6–20)
CALCIUM: 8.8 mg/dL — AB (ref 8.9–10.3)
CO2: 22 mmol/L (ref 22–32)
Chloride: 105 mmol/L (ref 101–111)
Creatinine, Ser: 2.09 mg/dL — ABNORMAL HIGH (ref 0.44–1.00)
GFR calc Af Amer: 23 mL/min — ABNORMAL LOW (ref >60)
GFR calc non Af Amer: 20 mL/min — ABNORMAL LOW (ref >60)
GLUCOSE: 98 mg/dL (ref 65–99)
Potassium: 4.8 mmol/L (ref 3.5–5.1)
Sodium: 138 mmol/L (ref 135–145)

## 2017-09-05 LAB — CBC WITH DIFFERENTIAL/PLATELET
Basophils Absolute: 0 10*3/uL (ref 0.0–0.1)
Basophils Relative: 0 %
EOS PCT: 2 %
Eosinophils Absolute: 0.1 10*3/uL (ref 0.0–0.7)
HCT: 30.2 % — ABNORMAL LOW (ref 36.0–46.0)
Hemoglobin: 9.6 g/dL — ABNORMAL LOW (ref 12.0–15.0)
LYMPHS PCT: 32 %
Lymphs Abs: 1.8 10*3/uL (ref 0.7–4.0)
MCH: 27 pg (ref 26.0–34.0)
MCHC: 31.8 g/dL (ref 30.0–36.0)
MCV: 84.8 fL (ref 78.0–100.0)
MONO ABS: 0.7 10*3/uL (ref 0.1–1.0)
MONOS PCT: 12 %
NEUTROS ABS: 3.1 10*3/uL (ref 1.7–7.7)
Neutrophils Relative %: 54 %
Platelets: 280 10*3/uL (ref 150–400)
RBC: 3.56 MIL/uL — ABNORMAL LOW (ref 3.87–5.11)
RDW: 13.6 % (ref 11.5–15.5)
WBC: 5.7 10*3/uL (ref 4.0–10.5)

## 2017-09-07 DIAGNOSIS — I739 Peripheral vascular disease, unspecified: Secondary | ICD-10-CM | POA: Diagnosis not present

## 2017-09-07 DIAGNOSIS — B351 Tinea unguium: Secondary | ICD-10-CM | POA: Diagnosis not present

## 2017-09-25 ENCOUNTER — Encounter: Payer: Self-pay | Admitting: Internal Medicine

## 2017-09-25 ENCOUNTER — Non-Acute Institutional Stay (SKILLED_NURSING_FACILITY): Payer: Medicare HMO | Admitting: Internal Medicine

## 2017-09-25 DIAGNOSIS — C189 Malignant neoplasm of colon, unspecified: Secondary | ICD-10-CM

## 2017-09-25 DIAGNOSIS — D509 Iron deficiency anemia, unspecified: Secondary | ICD-10-CM

## 2017-09-25 DIAGNOSIS — N185 Chronic kidney disease, stage 5: Secondary | ICD-10-CM

## 2017-09-25 DIAGNOSIS — I10 Essential (primary) hypertension: Secondary | ICD-10-CM | POA: Diagnosis not present

## 2017-09-25 DIAGNOSIS — F0390 Unspecified dementia without behavioral disturbance: Secondary | ICD-10-CM

## 2017-09-25 NOTE — Progress Notes (Signed)
Location:   [facility] Room Number: 113/D Place of Service:  SNF (31) Provider:  Terisa Starr, MD  Patient Care Team: Sinda Du, MD as PCP - General (Pulmonary Disease) [name], MD as Consulting Physician (Gastroenterology)  Extended Emergency Contact Information Primary Emergency Contact: [name] States of [place] Phone: [phone] Mobile Phone: [phone] Relation: Niece Secondary Emergency Contact: [place] of [place] Phone: [phone] Relation: Neighbor  Code Status:  Full Code Goals of care: Advanced Directive information Advanced Directives 09/25/2017  Does Patient Have a Medical Advance Directive? Yes  Type of Advance Directive (No Data)  Does patient want to make changes to medical advance directive? No - Patient declined  Copy of [place] in Chart? No - copy requested  Would patient like information on creating a medical advance directive? -     Chief Complaint  Patient presents with  . Medical Management of Chronic Issues    Routine Visit   For medical management of chronic medical conditions including dementia-anemia-chronic renal disease-hypertension- history of colorectal cancer HPI:  Pt is a 84 y.o. female seen today for medical management of chronic diseases.  As noted above.  She continues to be quite stable.  Her weight is stable at around 110 pounds according to nursing staff she eats and drinks fairly well.  No behaviors have been noted.  Her blood pressure appears to be somewhat variable I got 138/56 tonight appears listed blood pressures range from 938 up to 101 systolically.  She is on Norvasc 7.5 mg a day as well as Lopressor 50 mg twice daily pulses appear to be largely in the 60s that is what I got tonight.  She also has a history of chronic kidney disease status appears stable creatinine on lab done earlier this  month showed stability with a creatinine of 2.09 and BUN of 46-.  She also has anemia which suspect is most likely due to the renal disease-hemoglobin of 9.6 on lab done earlier this month appears to be relatively baseline baseline hemoglobins appear to be in the low 10 to mid 9 range.  She does have a history of colorectal cancer but again her hemoglobin has been stable nursing staff does not report any bleeding--  Regards to osteoporosis she has a T score of -2.6- s and a recent vitamin D level as well as calcium and phosphorus levels were unremarkable.  She does have dementia this appears to bestaleand she does well with supportive care she is very pleasant appropriate but does not speak much.     Past Medical History:  Diagnosis Date  . Anemia   . Arthritis   . History of blood transfusion    "today is the first time" (08/05/2014)  . Hypertension    Past Surgical History:  Procedure Laterality Date  . APPENDECTOMY    . CATARACT EXTRACTION Left   . CHOLECYSTECTOMY    . COLONOSCOPY N/A 09/26/2014   Procedure: COLONOSCOPY;  Surgeon: [name], MD;  [facility];  Service: Endoscopy;  Laterality: N/A;  1030am  . CYSTECTOMY     "had cyst taken off lower back"  . ESOPHAGOGASTRODUODENOSCOPY N/A 08/22/2014   BPZ:WCHENIDP ring at the gastro junction/small HH  . ORIF FEMUR FRACTURE Left 08/07/2014   Procedure: OPEN REDUCTION INTERNAL FIXATION (ORIF)  LEFT FEMUR FRACTURE;  Surgeon: [name], MD;  Location: [facility];  Service: Orthopedics;  Laterality: Left;  .  TONSILLECTOMY      No Known Allergies  Outpatient Encounter Medications as of 09/25/2017  Medication Sig  . acetaminophen (TYLENOL) 325 MG tablet Take 650 mg by mouth every 6 (six) hours as needed.  Marland Kitchen amLODipine (NORVASC) 5 MG tablet Take 7.5 mg by mouth daily. Give 1.5 tab to =7.5 mg once a day  . ferrous sulfate 325 (65 FE) MG tablet Take 325 mg by mouth 2 (two) times daily with a meal.  . metoprolol  (LOPRESSOR) 50 MG tablet Take 1 tablet (50 mg total) by mouth 2 (two) times daily.  . Nutritional Supplements (ENSURE ENLIVE PO) Give once day  . Polyethyl Glycol-Propyl Glycol (SYSTANE) 0.4-0.3 % GEL ophthalmic gel Place 1 application into both eyes 2 (two) times daily at 10 AM and 5 PM.   No facility-administered encounter medications on file as of 09/25/2017.      Review of Systems   This is limited since patient has somewhat of a poor historian and does not speak much but when asked she has no complaints other than occasionally some knee discomfort secondary to arthritis per nursing she does not ask for pain medication very often but does have Tylenol when needed.    Immunization History  Administered Date(s) Administered  . Influenza-Unspecified 08/01/2017  . Pneumococcal Conjugate-13 08/01/2017  . Pneumococcal Polysaccharide-23 06/13/2016  . Tdap 08/05/2014, 05/18/2017   Pertinent  Health Maintenance Due  Topic Date Due  . INFLUENZA VACCINE  Completed  . DEXA SCAN  Completed  . PNA vac Low Risk Adult  Completed   Fall Risk  06/05/2017  Falls in the past year? Yes  Number falls in past yr: 1  Injury with Fall? Yes   Functional Status Survey:    Vitals:   09/25/17 1615  BP: (!) 150/63  Pulse: 68  Resp: 20  Temp: 97.6 F (36.4 C)  TempSrc: Oral  SpO2: 94%  Weight: 110 lb 3.2 oz (50 kg)  Height: 4\' 11"  (1.499 m)  Of note manual blood pressure tonight was 138/56 Body mass index is 22.26 kg/m. Physical Exam   In general this is a pleasant elderly female in no distress sitting in her wheelchair.  Her skin is warm and dry left lower back she does have a raised area again lipoma like which is chronic it is not erythematous or tender.  Eyes sclera and conjunctive are clear visual acuity appears grossly intact.  Oropharynx is clear mucous membranes moist.  Chest is clear to auscultation there is no labored breathing.  Heart is regular rate and rhythm without  murmur gallop rub she does not really have significant lower extremity edema.  Abdomen continues to be soft nontender with positive bowel sounds.  Musculoskeletal does move all extremities x4 with some baseline lower extremity weakness she does have arthritic changes of her knees bilaterally but I do not note any increased warmth erythema or edema - gentle range of motion of the knees bilaterally does not really elicit any pain complaints  Neurologic is grossly intact her speech is clear she did not speak much.  Psych she is oriented to self follow simple verbal commands without difficulty does speak somewhat in short phrases but does answer appropriately to straightforward questions  Labs reviewed: Recent Labs    06/21/17 0726 08/01/17 0740 09/05/17 0430  NA 138 136 138  K 4.5 4.4 4.8  CL 103 104 105  CO2 24 22 22   GLUCOSE 106* 104* 98  BUN 34* 40* 46*  CREATININE  1.83* 1.96* 2.09*  CALCIUM 9.2 9.5 8.8*  PHOS  --  3.6  --    Recent Labs    05/15/17 0600 08/01/17 0740  AST 17 19  ALT 11* 13*  ALKPHOS 72 89  BILITOT 0.3 0.3  PROT 6.8 7.2  ALBUMIN 3.3* 3.9   Recent Labs    11/17/16 0700 05/15/17 0600 09/05/17 0430  WBC 7.1 7.0 5.7  NEUTROABS  --  4.1 3.1  HGB 9.8* 10.5* 9.6*  HCT 30.4* 32.8* 30.2*  MCV 83.5 83.7 84.8  PLT 294 240 280   Lab Results  Component Value Date   TSH 2.515 08/01/2017   No results found for: HGBA1C No results found for: CHOL, HDL, LDLCALC, LDLDIRECT, TRIG, CHOLHDL  Significant Diagnostic Results in last 30 days:  No results found.  Assessment/Plan There are no diagnoses linked to this encounter.   Family/ staff Communication:  #1 dementia this appears to be stable she is doing quite well with supportive care weight is stable continues to eat and drink fairly well-nursing does not report any behaviors.  2.  Hypertension as noted above somewhat variable systolics but I do not see consistent elevations at this point will monitor and  continue Norvasc and Lopressor at current doses.  3.  History of anemia this appears to be relatively baseline at 9.6 will monitor this periodically she is on iron.  4.  History of chronic renal disease this appears relatively baseline as well with a creatinine of 2.09 BUN of 46 on lab done earlier this month she also has a history of mild hyperkalemia but this is stable at 4.8 on the lab done recently.  5.  History of osteoporosis again with a T score of -2.6 her vitamin D calcium phosphorus and TSH have been within normal limits.  6.  History of osteoarthritis-nursing staff says she does not really complain of pain however she does have as needed Tylenol if needed at this point will monitor physical exam tonight again did not really elicit discomfort  [id number]

## 2017-10-17 ENCOUNTER — Non-Acute Institutional Stay (SKILLED_NURSING_FACILITY): Payer: Medicare HMO | Admitting: Internal Medicine

## 2017-10-17 ENCOUNTER — Encounter: Payer: Self-pay | Admitting: Internal Medicine

## 2017-10-17 DIAGNOSIS — F0390 Unspecified dementia without behavioral disturbance: Secondary | ICD-10-CM | POA: Diagnosis not present

## 2017-10-17 DIAGNOSIS — I10 Essential (primary) hypertension: Secondary | ICD-10-CM

## 2017-10-17 DIAGNOSIS — N185 Chronic kidney disease, stage 5: Secondary | ICD-10-CM

## 2017-10-17 DIAGNOSIS — D509 Iron deficiency anemia, unspecified: Secondary | ICD-10-CM

## 2017-10-17 NOTE — Progress Notes (Signed)
Location:   [place] Room Number: 113/D Place of Service:  SNF (31) Provider:  Terisa Starr, MD  Patient Care Team: Sinda Du, MD as PCP - General (Pulmonary Disease) [name], MD as Consulting Physician (Gastroenterology)  Extended Emergency Contact Information Primary Emergency Contact: [name] States of [place] Phone: [phone] Mobile Phone: [phone] Relation: Niece Secondary Emergency Contact: [place] of [place] Phone: [phone] Relation: Neighbor  Code Status:  Full Code Goals of care: Advanced Directive information Advanced Directives 10/17/2017  Does Patient Have a Medical Advance Directive? Yes  Type of Advance Directive (No Data)  Does patient want to make changes to medical advance directive? No - Patient declined  Copy of [place] in Chart? No - copy requested  Would patient like information on creating a medical advance directive? -     Chief Complaint  Patient presents with  . Medical Management of Chronic Issues    Routine Visit   For routine medical management of chronic medical conditions including dementia-hypertension-history of chronic kidney disease-anemia- HPI:  Pt is a 84 y.o. female seen today for medical management of chronic diseases.  As noted above.  She continues to be quite stable her weight is stable at 111 pounds.  Nursing does not report really any issues.  Regards to dementia she is doing well with supportive care continues to be pleasant with no behaviors noted.  She does have a history of hypertension this appears well controlled I got a manual reading of 134/62 this evening I see previous readings 137/67- it appears his blood pressures largely systolically appear to be more in the 120-130 range-pulses are in the high 50s to low 60s sometimes in the 70s  She is on Lopressor 50 mg twice daily as well as  Norco 7.5 mg a day.  At times she will complain of knee pain apparently the as needed Tylenol is effective with that.  She also has a history of anemia hemoglobin was 9.6 on lab done on September 05, 2017 this appears relatively baseline with previous labs which have shown hemoglobins ranging from the mid nines to the mid tens-she is on iron.  She also has a history of chronic renal insufficiency creatinine 2.09 and BUN of 46 on lab done in early January appears to be relatively baseline.  Currently nursing and the patient do not express any concerns again at times Riyah will complain of some knee pain   Past Medical History:  Diagnosis Date  . Anemia   . Arthritis   . History of blood transfusion    "today is the first time" (08/05/2014)  . Hypertension    Past Surgical History:  Procedure Laterality Date  . APPENDECTOMY    . CATARACT EXTRACTION Left   . CHOLECYSTECTOMY    . COLONOSCOPY N/A 09/26/2014   Procedure: COLONOSCOPY;  Surgeon: [name], MD;  [facility];  Service: Endoscopy;  Laterality: N/A;  1030am  . CYSTECTOMY     "had cyst taken off lower back"  . ESOPHAGOGASTRODUODENOSCOPY N/A 08/22/2014   GGE:[id number] ring at the gastro junction/small HH  . ORIF FEMUR FRACTURE Left 08/07/2014   Procedure: OPEN REDUCTION INTERNAL FIXATION (ORIF)  LEFT FEMUR FRACTURE;  Surgeon: [name], MD;  Location: [facility];  Service: Orthopedics;  Laterality: Left;  . TONSILLECTOMY      No Known Allergies  Outpatient Encounter Medications as of 10/17/2017  Medication  Sig  . acetaminophen (TYLENOL) 325 MG tablet Take 650 mg by mouth every 6 (six) hours as needed.  Marland Kitchen amLODipine (NORVASC) 5 MG tablet Take 7.5 mg by mouth daily. Give 1.5 tab to =7.5 mg once a day  . ferrous sulfate 325 (65 FE) MG tablet Take 325 mg by mouth 2 (two) times daily with a meal.  . metoprolol (LOPRESSOR) 50 MG tablet Take 1 tablet (50 mg total) by mouth 2 (two) times daily.  . Nutritional  Supplements (ENSURE ENLIVE PO) Give once day  . Polyethyl Glycol-Propyl Glycol (SYSTANE) 0.4-0.3 % GEL ophthalmic gel Place 1 application into both eyes 2 (two) times daily at 10 AM and 5 PM.   No facility-administered encounter medications on file as of 10/17/2017.      Review of Systems   This is limited secondary to being a poor historian but according to patient nursing there are no acute issues apparently she eats and drinks fairly well weights have been stable she has no acute complaints tonight at times will complain of some knee pain which has been chronic  Immunization History  Administered Date(s) Administered  . Influenza-Unspecified 08/01/2017  . Pneumococcal Conjugate-13 08/01/2017  . Pneumococcal Polysaccharide-23 06/13/2016  . Tdap 08/05/2014, 05/18/2017   Pertinent  Health Maintenance Due  Topic Date Due  . INFLUENZA VACCINE  Completed  . DEXA SCAN  Completed  . PNA vac Low Risk Adult  Completed   Fall Risk  06/05/2017  Falls in the past year? Yes  Number falls in past yr: 1  Injury with Fall? Yes   Functional Status Survey:    Vitals:   10/17/17 1600  BP: 131/67  Pulse: 85  Resp: 18  Temp: (!) 97.2 F (36.2 C)  TempSrc: Oral  SpO2: 94%  Weight: 111 lb 9.6 oz (50.6 kg)  Height: 4\' 11"  (1.499 m)   Body mass index is 22.54 kg/m. Physical Exam   In general this is a very pleasant elderly female in no distress sitting comfortably in her wheelchair.  Her skin is warm and dry in the left low back she does have a raised area most likely a lipoma this is chronic and nontender nonerythematous and unchanged from previous exams.  Eyes sclera and conjunctive are clear visual acuity appears to be intact.  Oropharynx is clear mucous membranes moist.  Chest continues to be clear with somewhat shallow air entry there is no labored breathing.  Heart is regular rate and rhythm without murmur gallop or rub she does not have significant lower extremity  edema.  Her abdomen is soft it is nontender has positive bowel sounds.  Musculoskeletal continues to move all her extremities x4-she does have arthritic changes most prominently of her knees bilaterally but I do not really note any increased edema or erythema or warmth of the areas I could not really elicit any significant discomfort with gentle passive range of motion flexion and extending her knees this evening.  Neurologic is grossly intact her speech is clear she does not speak much but is appropriate when asked questions.  Psych she is oriented to self follow simple verbal commands without any difficulty but can carry on a short conversation continues to have a very pleasant disposition  Labs reviewed: Recent Labs    06/21/17 0726 08/01/17 0740 09/05/17 0430  NA 138 136 138  K 4.5 4.4 4.8  CL 103 104 105  CO2 24 22 22   GLUCOSE 106* 104* 98  BUN 34* 40* 46*  CREATININE 1.83* 1.96* 2.09*  CALCIUM 9.2 9.5 8.8*  PHOS  --  3.6  --    Recent Labs    05/15/17 0600 08/01/17 0740  AST 17 19  ALT 11* 13*  ALKPHOS 72 89  BILITOT 0.3 0.3  PROT 6.8 7.2  ALBUMIN 3.3* 3.9   Recent Labs    11/17/16 0700 05/15/17 0600 09/05/17 0430  WBC 7.1 7.0 5.7  NEUTROABS  --  4.1 3.1  HGB 9.8* 10.5* 9.6*  HCT 30.4* 32.8* 30.2*  MCV 83.5 83.7 84.8  PLT 294 240 280   Lab Results  Component Value Date   TSH 2.515 08/01/2017   No results found for: HGBA1C No results found for: CHOL, HDL, LDLCALC, LDLDIRECT, TRIG, CHOLHDL  Significant Diagnostic Results in last 30 days:  No results found.  Assessment/Plan.  : #1-history of dementia this appears stable with supportive care her weight appears to be stable nursing does not report any issues she does not have any noted behaviors-at this point continue supportive care.  2.  Hypertension this continues to be quite stable-she is on Lopressor as well as Norvasc.  3.  History of anemia this appears relatively stable as well last hemoglobin  was 9.6 last month which appears to be relatively baseline with previous levels in the 9-tens range she is on iron.  She does have some previous history of colorectal cancer hemoglobin scan have been stable.  4.-History of chronic renal disease appears stable as well with a creatinine of 2.09 BUN of 46 on the January lab she also in the past has had some history of mild hyperkalemia but this has been stable now for some time.willl need periodic monitoring  5.  History of osteoporosis she had a T score of -2.6 vitamin D calcium phosphorus and TSH were all within normal limits when recently obtained.  6.  History of osteoarthritis especially of her knees apparently this is controlled with the Tylenol at this point monitor.  [id number]

## 2017-10-25 ENCOUNTER — Non-Acute Institutional Stay (SKILLED_NURSING_FACILITY): Payer: Medicare HMO | Admitting: Internal Medicine

## 2017-10-25 ENCOUNTER — Encounter: Payer: Self-pay | Admitting: Internal Medicine

## 2017-10-25 DIAGNOSIS — D509 Iron deficiency anemia, unspecified: Secondary | ICD-10-CM

## 2017-10-25 DIAGNOSIS — R197 Diarrhea, unspecified: Secondary | ICD-10-CM

## 2017-10-25 DIAGNOSIS — C189 Malignant neoplasm of colon, unspecified: Secondary | ICD-10-CM | POA: Diagnosis not present

## 2017-10-25 DIAGNOSIS — N185 Chronic kidney disease, stage 5: Secondary | ICD-10-CM

## 2017-10-25 NOTE — Progress Notes (Signed)
Location:   [place] Room Number: 113/D Place of Service:  SNF [phone]) Provider:  Terisa Starr, MD  Patient Care Team: Sinda Du, MD as PCP - General (Pulmonary Disease) [name], MD as Consulting Physician (Gastroenterology)  Extended Emergency Contact Information Primary Emergency Contact: [name] States of [place] Phone: [phone] Mobile Phone: [phone] Relation: Niece Secondary Emergency Contact: [place] of [place] Phone: [phone] Relation: Neighbor  Code Status:  Full Code Goals of care: Advanced Directive information Advanced Directives 10/25/2017  Does Patient Have a Medical Advance Directive? Yes  Type of Advance Directive (No Data)  Does patient want to make changes to medical advance directive? No - Patient declined  Copy of [place] in Chart? No - copy requested  Would patient like information on creating a medical advance directive? -     Chief Complaint  Patient presents with  . Acute Visit    Patients c/o Loose Stools    HPI:  Pt is a 84 y.o. Guerra seen today for an acute visit for apparent loose stools-apparently she has had this for the past week or so --- but has increased the past couple days.  Today she is complaining at times of some intermittent stomach pain-she currently is denying any stomach pain.  She does have a history of colon cancer status post resection back in 2016- as well as anemia she is on iron hemoglobins have been stable.  She also has a history of hypertension as well as chronic renal insufficiency.  She is done well with supportive care here.  Regards to the diarrhea she is not complaining of any nausea or vomiting- she has not been on antibiotics recently  Per review of previous notes it appears she was diagnosed with cancer of the ascending colon and rectum back in 2016 and subsequently underwent  resection and temporarily had an ileostomy.  At that point she had significant rectal bleeding and apparently at one point she had deferred surgical intervention but then was agreeable to it and the resection apparently was performed.  I also note liver function test back in November were fairly unremarkable- metabolic panel back in early January showed baseline renal insufficiency with a creatinine of 2.09 BUN of 46.  At that point her hemoglobin was 9.6 which is relatively within her baseline      Past Medical History:  Diagnosis Date  . Anemia   . Arthritis   . History of blood transfusion    "today is the first time" (08/05/2014)  . Hypertension    Past Surgical History:  Procedure Laterality Date  . APPENDECTOMY    . CATARACT EXTRACTION Left   . CHOLECYSTECTOMY    . COLONOSCOPY N/A 09/26/2014   Procedure: COLONOSCOPY;  Surgeon: [name], MD;  [facility];  Service: Endoscopy;  Laterality: N/A;  1030am  . CYSTECTOMY     "had cyst taken off lower back"  . ESOPHAGOGASTRODUODENOSCOPY N/A 08/22/2014   GEX:[id number] ring at the gastro junction/small HH  . ORIF FEMUR FRACTURE Left 08/07/2014   Procedure: OPEN REDUCTION INTERNAL FIXATION (ORIF)  LEFT FEMUR FRACTURE;  Surgeon: [name], MD;  Location: [facility];  Service: Orthopedics;  Laterality: Left;  . TONSILLECTOMY      No Known Allergies  Outpatient Encounter Medications as of 10/25/2017  Medication Sig  . acetaminophen (TYLENOL) 325 MG tablet Take 650 mg by mouth every 6 (six) hours  as needed.  Marland Kitchen amLODipine (NORVASC) 5 MG tablet Take 7.5 mg by mouth daily. Give 1.5 tab to =7.5 mg once a day  . ferrous sulfate 325 (65 FE) MG tablet Take 325 mg by mouth 2 (two) times daily with a meal.  . metoprolol (LOPRESSOR) 50 MG tablet Take 1 tablet (50 mg total) by mouth 2 (two) times daily.  . Nutritional Supplements (ENSURE ENLIVE PO) Give once day  . Polyethyl Glycol-Propyl Glycol (SYSTANE) 0.4-0.3 % GEL  ophthalmic gel Place 1 application into both eyes 2 (two) times daily at 10 AM and 5 PM.   No facility-administered encounter medications on file as of 10/25/2017.     Review of Systems   This again is somewhat limited since she is a poor historian but she is currently denying any abdominal discomfort although sometimes she says she will have stomach pain.  According to nursing she has had diarrhea but there is been no nausea or vomiting-she is not complaining of any chest pain headache dizziness.  Other than the diarrhea nursing staff does not report any issues  Immunization History  Administered Date(s) Administered  . Influenza-Unspecified 08/01/2017  . Pneumococcal Conjugate-13 08/01/2017  . Pneumococcal Polysaccharide-23 06/13/2016  . Tdap 08/05/2014, 05/18/2017   Pertinent  Health Maintenance Due  Topic Date Due  . INFLUENZA VACCINE  Completed  . DEXA SCAN  Completed  . PNA vac Low Risk Adult  Completed   Fall Risk  06/05/2017  Falls in the past year? Yes  Number falls in past yr: 1  Injury with Fall? Yes   Functional Status Survey:    Vitals:   10/25/17 1441  BP: (!) 144/67  Pulse: 71  Resp: 18  Temp: (!) 97.2 F (36.2 C)  TempSrc: Oral  Of note manual blood pressure was 142/60 pulse was 62  Physical Exam In general this is a very pleasant elderly Guerra in no distress sitting comfortably in her wheelchair.  Skin is warm and dry.  Eyes visual acuity appears grossly intact sclera and conjunctive are clear.  Oropharynx is clear mucous membranes moist.  Chest is clear to auscultation with somewhat shallow air entry there is no labored breathing.  Heart continues to be regular rate and rhythm without murmur gallop or rub she has quite mild  lower extremity edema  Abdomen is soft nontender with active bowel sounds.  Musculoskeletal moves all extremities x4 at baseline with baseline arthritic changes most prominently of her knees bilaterally.  Neurologic is  grossly intact her speech is clear but she speaks very softly does not speak much  Psych she is oriented to self can follow a fairly simple  verbal commands without difficulty she can carry on a short conversation-- Labs reviewed: Recent Labs    06/21/17 0726 08/01/17 0740 09/05/17 0430  NA 138 136 138  K 4.5 4.4 4.8  CL 103 104 105  CO2 24 22 22   GLUCOSE 106* 104* 98  BUN 34* 40* 46*  CREATININE 1.83* 1.96* 2.09*  CALCIUM 9.2 9.5 8.8*  PHOS  --  3.6  --    Recent Labs    05/15/17 0600 08/01/17 0740  AST 17 19  ALT 11* 13*  ALKPHOS 72 89  BILITOT 0.3 0.3  PROT 6.8 7.2  ALBUMIN 3.3* 3.9   Recent Labs    11/17/16 0700 05/15/17 0600 09/05/17 0430  WBC 7.1 7.0 5.7  NEUTROABS  --  4.1 3.1  HGB 9.8* 10.5* 9.6*  HCT 30.4* 32.8* 30.2*  MCV 83.5 83.7 84.8  PLT 294 240 280   Lab Results  Component Value Date   TSH 2.515 08/01/2017   No results found for: HGBA1C No results found for: CHOL, HDL, LDLCALC, LDLDIRECT, TRIG, CHOLHDL  Significant Diagnostic Results in last 30 days:  No results found.  Assessment/Plan  #1 history of diarrhea-will order a C. difficile culture as well as stool cultures.for toxins  Also continue to monitor vital signs every shift for 48 hours.  Also will update her labs including a CBC with differential as well as a metabolic panel to keep an eye on her hemoglobin as well as electrolytes with history of diarrhea-and reevaluate her liver function tests as well.  Currently she is denying any abdominal discomfort but says occasionally she will have stomach pain- I do not the history of colorectal cancer- will see what the labs tell us tomorrow- suspect with her advanced age aggressive workup would probably be problematic-- But if persists will order GI consult if.  This plan was discussed with Dr. Lyndel Safe via phone  #2 history of chronic kidney disease-again will update labs tomorrow.  3.  History of iron deficiency anemia she is on iron  again hemoglobins appear to have been stable but will recheck this tomorrow  [id number]-of note greater than 35 minutes spent assessing patient-reviewing her chart and labs as well as previous consult notes  including those  At  [facility] --as well as discussing her status with nursing staff and Dr. Heide Scales note greater than 50% of time spent coordinating plan of care with input as noted above

## 2017-10-27 ENCOUNTER — Encounter (HOSPITAL_COMMUNITY)
Admission: RE | Admit: 2017-10-27 | Discharge: 2017-10-27 | Disposition: A | Discharge status: Home / Self Care | Payer: Medicare HMO | Source: Skilled Nursing Facility | Attending: Internal Medicine | Admitting: Internal Medicine

## 2017-10-27 DIAGNOSIS — F5089 Other specified eating disorder: Secondary | ICD-10-CM | POA: Insufficient documentation

## 2017-10-27 DIAGNOSIS — M6281 Muscle weakness (generalized): Secondary | ICD-10-CM | POA: Diagnosis not present

## 2017-10-27 DIAGNOSIS — M159 Polyosteoarthritis, unspecified: Secondary | ICD-10-CM | POA: Diagnosis not present

## 2017-10-27 DIAGNOSIS — E876 Hypokalemia: Secondary | ICD-10-CM | POA: Diagnosis not present

## 2017-10-27 DIAGNOSIS — N189 Chronic kidney disease, unspecified: Secondary | ICD-10-CM | POA: Insufficient documentation

## 2017-10-27 DIAGNOSIS — D649 Anemia, unspecified: Secondary | ICD-10-CM | POA: Insufficient documentation

## 2017-10-27 LAB — CBC WITH DIFFERENTIAL/PLATELET
Basophils Absolute: 0 10*3/uL (ref 0.0–0.1)
Basophils Relative: 1 %
EOS ABS: 0.2 10*3/uL (ref 0.0–0.7)
EOS PCT: 3 %
HCT: 34.9 % — ABNORMAL LOW (ref 36.0–46.0)
HEMOGLOBIN: 10.7 g/dL — AB (ref 12.0–15.0)
LYMPHS ABS: 1.9 10*3/uL (ref 0.7–4.0)
Lymphocytes Relative: 30 %
MCH: 26 pg (ref 26.0–34.0)
MCHC: 30.7 g/dL (ref 30.0–36.0)
MCV: 84.9 fL (ref 78.0–100.0)
MONOS PCT: 8 %
Monocytes Absolute: 0.5 10*3/uL (ref 0.1–1.0)
NEUTROS PCT: 58 %
Neutro Abs: 3.7 10*3/uL (ref 1.7–7.7)
Platelets: 321 10*3/uL (ref 150–400)
RBC: 4.11 MIL/uL (ref 3.87–5.11)
RDW: 13.7 % (ref 11.5–15.5)
WBC: 6.3 10*3/uL (ref 4.0–10.5)

## 2017-10-27 LAB — COMPREHENSIVE METABOLIC PANEL
ALK PHOS: 86 U/L (ref 38–126)
ALT: 13 U/L — ABNORMAL LOW (ref 14–54)
AST: 20 U/L (ref 15–41)
Albumin: 3.8 g/dL (ref 3.5–5.0)
Anion gap: 14 (ref 5–15)
BUN: 47 mg/dL — ABNORMAL HIGH (ref 6–20)
CALCIUM: 9.5 mg/dL (ref 8.9–10.3)
CO2: 22 mmol/L (ref 22–32)
CREATININE: 1.92 mg/dL — AB (ref 0.44–1.00)
Chloride: 102 mmol/L (ref 101–111)
GFR, EST AFRICAN AMERICAN: 25 mL/min — AB (ref >60)
GFR, EST NON AFRICAN AMERICAN: 22 mL/min — AB (ref >60)
Glucose, Bld: 103 mg/dL — ABNORMAL HIGH (ref 65–99)
Potassium: 4.8 mmol/L (ref 3.5–5.1)
SODIUM: 138 mmol/L (ref 135–145)
TOTAL PROTEIN: 7.6 g/dL (ref 6.5–8.1)
Total Bilirubin: 0.3 mg/dL (ref 0.3–1.2)

## 2017-11-06 ENCOUNTER — Encounter: Payer: Self-pay | Admitting: Internal Medicine

## 2017-11-06 ENCOUNTER — Encounter (HOSPITAL_COMMUNITY)
Admission: RE | Admit: 2017-11-06 | Discharge: 2017-11-06 | Disposition: A | Discharge status: Home / Self Care | Payer: Medicare HMO | Source: Skilled Nursing Facility | Attending: Internal Medicine | Admitting: Internal Medicine

## 2017-11-06 ENCOUNTER — Non-Acute Institutional Stay (SKILLED_NURSING_FACILITY): Payer: Medicare HMO | Admitting: Internal Medicine

## 2017-11-06 DIAGNOSIS — N189 Chronic kidney disease, unspecified: Secondary | ICD-10-CM | POA: Diagnosis not present

## 2017-11-06 DIAGNOSIS — B349 Viral infection, unspecified: Secondary | ICD-10-CM

## 2017-11-06 DIAGNOSIS — M6281 Muscle weakness (generalized): Secondary | ICD-10-CM | POA: Diagnosis not present

## 2017-11-06 DIAGNOSIS — M159 Polyosteoarthritis, unspecified: Secondary | ICD-10-CM | POA: Insufficient documentation

## 2017-11-06 DIAGNOSIS — I10 Essential (primary) hypertension: Secondary | ICD-10-CM | POA: Diagnosis not present

## 2017-11-06 DIAGNOSIS — E876 Hypokalemia: Secondary | ICD-10-CM | POA: Insufficient documentation

## 2017-11-06 DIAGNOSIS — F5089 Other specified eating disorder: Secondary | ICD-10-CM | POA: Diagnosis not present

## 2017-11-06 LAB — INFLUENZA PANEL BY PCR (TYPE A & B)
Influenza A By PCR: NEGATIVE
Influenza B By PCR: NEGATIVE

## 2017-11-06 NOTE — Progress Notes (Signed)
Location:   [place] Room Number: 113/D Place of Service:  SNF (31) Provider:  Quintella Baton, MD  Patient Care Team: Sinda Du, MD as PCP - General (Pulmonary Disease) [name], MD as Consulting Physician (Gastroenterology)  Extended Emergency Contact Information Primary Emergency Contact: [name] [place] of [place] Phone: [phone] Mobile Phone: [phone] Relation: Niece Secondary Emergency Contact: [place] of [place] Phone: [phone] Relation: Neighbor  Code Status:  Full Code Goals of care: Advanced Directive information Advanced Directives 11/06/2017  Does Patient Have a Medical Advance Directive? Yes  Type of Advance Directive (No Data)  Does patient want to make changes to medical advance directive? No - Patient declined  Copy of [place] in Chart? No - copy requested  Would patient like information on creating a medical advance directive? -     Chief Complaint  Patient presents with  . Acute Visit    Patients c/o Not feeling well    HPI:  Pt is a 84 y.o. female seen today for an acute visit for not feeling well.  Patient has h/o Hypertension, Colorectal cancer s/p Right Colectomy, Anemia, And Chronic renal disease.  Patients roommate recently got diagnosed with influenza and is on Tamiflu. Patient is also on prophylactic Tamiflu. This morning she complained to the nurses that she probably has flu also. She said that she was not feeling well.  When I went to see patient, she denied any fever, chills, cough, chest pain. She had good breakfast and lunch and denied any nausea and vomiting. She did say that I'm scared because my roommate is sick.   Past Medical History:  Diagnosis Date  . Anemia   . Arthritis   . History of blood transfusion    "today is the first time" (08/05/2014)  . Hypertension    Past Surgical History:    Procedure Laterality Date  . APPENDECTOMY    . CATARACT EXTRACTION Left   . CHOLECYSTECTOMY    . COLONOSCOPY N/A 09/26/2014   Procedure: COLONOSCOPY;  Surgeon: [name], MD;  [facility];  Service: Endoscopy;  Laterality: N/A;  1030am  . CYSTECTOMY     "had cyst taken off lower back"  . ESOPHAGOGASTRODUODENOSCOPY N/A 08/22/2014   EGB:[id number] ring at the gastro junction/small HH  . ORIF FEMUR FRACTURE Left 08/07/2014   Procedure: OPEN REDUCTION INTERNAL FIXATION (ORIF)  LEFT FEMUR FRACTURE;  Surgeon: [name], MD;  Location: [facility];  Service: Orthopedics;  Laterality: Left;  . TONSILLECTOMY      No Known Allergies  Outpatient Encounter Medications as of 11/06/2017  Medication Sig  . acetaminophen (TYLENOL) 325 MG tablet Take 650 mg by mouth every 6 (six) hours as needed.  Marland Kitchen amLODipine (NORVASC) 5 MG tablet Take 7.5 mg by mouth daily. Give 1.5 tab to =7.5 mg once a day  . ferrous sulfate 325 (65 FE) MG tablet Take 325 mg by mouth 2 (two) times daily with a meal.  . metoprolol (LOPRESSOR) 50 MG tablet Take 1 tablet (50 mg total) by mouth 2 (two) times daily.  . Nutritional Supplements (ENSURE ENLIVE PO) Give once day  . oseltamivir (TAMIFLU) 30 MG capsule Give 1 capsule by mouth  once a day every other day for 2 weeks  . Polyethyl Glycol-Propyl Glycol (SYSTANE) 0.4-0.3 % GEL ophthalmic gel Place 1 application into both eyes 2 (two) times daily at 10 AM and 5 PM.  No facility-administered encounter medications on file as of 11/06/2017.      Review of Systems  Review of Systems  Constitutional: Negative for activity change, appetite change, chills, diaphoresis, fatigue and fever.  HENT: Negative for mouth sores, postnasal drip, rhinorrhea, sinus pain and sore throat.   Respiratory: Negative for apnea, cough, chest tightness, shortness of breath and wheezing.   Cardiovascular: Negative for chest pain, palpitations and leg swelling.  Gastrointestinal: Negative for  abdominal distention, abdominal pain, constipation, diarrhea, nausea and vomiting.  Genitourinary: Negative for dysuria and frequency.  Musculoskeletal: Negative for arthralgias, joint swelling and myalgias.  Skin: Negative for rash.  Neurological: Negative for dizziness, syncope, weakness, light-headedness and numbness.  Psychiatric/Behavioral: Negative for behavioral problems, confusion and sleep disturbance.    Immunization History  Administered Date(s) Administered  . Influenza-Unspecified 08/01/2017  . Pneumococcal Conjugate-13 08/01/2017  . Pneumococcal Polysaccharide-23 06/13/2016  . Tdap 08/05/2014, 05/18/2017   Pertinent  Health Maintenance Due  Topic Date Due  . INFLUENZA VACCINE  Completed  . DEXA SCAN  Completed  . PNA vac Low Risk Adult  Completed   Fall Risk  06/05/2017  Falls in the past year? Yes  Number falls in past yr: 1  Injury with Fall? Yes   Functional Status Survey:    Vitals:   11/06/17 1259  BP: (!) 134/53  Pulse: 63  Resp: 20  Temp: 97.6 F (36.4 C)  TempSrc: Oral   There is no height or weight on file to calculate BMI. Physical Exam  Constitutional: She appears well-developed and well-nourished.  HENT:  Head: Normocephalic.  Mouth/Throat: Oropharynx is clear and moist.  Eyes: Pupils are equal, round, and reactive to light.  Neck: Neck supple.  Cardiovascular: Normal rate and normal heart sounds.  Pulmonary/Chest: Effort normal and breath sounds normal. No respiratory distress. She has no wheezes.  Abdominal: Soft. She exhibits no distension. There is no tenderness. There is no rebound and no guarding.  Musculoskeletal: She exhibits no edema.  Lymphadenopathy:    She has no cervical adenopathy.  Neurological: She is alert.  Psychiatric: She has a normal mood and affect. Her behavior is normal. Judgment and thought content normal.  Patient looked little concerned because of the flu diagnosis of her roommate.     Labs reviewed: Recent  Labs    08/01/17 0740 09/05/17 0430 10/27/17 0734  NA 136 138 138  K 4.4 4.8 4.8  CL 104 105 102  CO2 22 22 22   GLUCOSE 104* 98 103*  BUN 40* 46* 47*  CREATININE 1.96* 2.09* 1.92*  CALCIUM 9.5 8.8* 9.5  PHOS 3.6  --   --    Recent Labs    05/15/17 0600 08/01/17 0740 10/27/17 0734  AST 17 19 20   ALT 11* 13* 13*  ALKPHOS 72 89 86  BILITOT 0.3 0.3 0.3  PROT 6.8 7.2 7.6  ALBUMIN 3.3* 3.9 3.8   Recent Labs    05/15/17 0600 09/05/17 0430 10/27/17 0734  WBC 7.0 5.7 6.3  NEUTROABS 4.1 3.1 3.7  HGB 10.5* 9.6* 10.7*  HCT 32.8* 30.2* 34.9*  MCV 83.7 84.8 84.9  PLT 240 280 321   Lab Results  Component Value Date   TSH 2.515 08/01/2017   No results found for: HGBA1C No results found for: CHOL, HDL, LDLCALC, LDLDIRECT, TRIG, CHOLHDL  Significant Diagnostic Results in last 30 days:  No results found.  Assessment/Plan Questionable viral illness.  A flu test was done and it was negative.  Patient was reassured  that she does not have flu. She will continue Tamiflu, Prophylactics Vital signs Q shift.   No treatment required at this time. Essential Hypertension Continue on amlodipine and metoprolol.   Family/ staff Communication:   Labs/tests ordered:   Total time spent in this patient care encounter was 25_ minutes; greater than 50% of the visit spent counseling patient, reviewing records , Labs and coordinating care for problems addressed at this encounter.

## 2017-11-06 NOTE — Progress Notes (Signed)
Location:   [facility] Room Number: 113/D Place of Service:  SNF [phone]) Provider:  Terisa Starr, MD  Patient Care Team: Sinda Du, MD as PCP - General (Pulmonary Disease) [name], MD as Consulting Physician (Gastroenterology)  Extended Emergency Contact Information Primary Emergency Contact: [name] [place] of [place] Phone: [phone] Mobile Phone: [phone] Relation: Niece Secondary Emergency Contact: [place] of [place] Phone: [phone] Relation: Neighbor  Code Status:  Full Code Goals of care: Advanced Directive information Advanced Directives 11/06/2017  Does Patient Have a Medical Advance Directive? Yes  Type of Advance Directive (No Data)  Does patient want to make changes to medical advance directive? No - Patient declined  Copy of [place] in Chart? No - copy requested  Would patient like information on creating a medical advance directive? -     Chief Complaint  Patient presents with  . Acute Visit    Patients c/o Not feeleing well possible Flu    HPI:  Pt is a 84 y.o. female seen today for an acute visit for    Past Medical History:  Diagnosis Date  . Anemia   . Arthritis   . History of blood transfusion    "today is the first time" (08/05/2014)  . Hypertension    Past Surgical History:  Procedure Laterality Date  . APPENDECTOMY    . CATARACT EXTRACTION Left   . CHOLECYSTECTOMY    . COLONOSCOPY N/A 09/26/2014   Procedure: COLONOSCOPY;  Surgeon: [name], MD;  [facility];  Service: Endoscopy;  Laterality: N/A;  1030am  . CYSTECTOMY     "had cyst taken off lower back"  . ESOPHAGOGASTRODUODENOSCOPY N/A 08/22/2014   IRC:[id number] ring at the gastro junction/small HH  . ORIF FEMUR FRACTURE Left 08/07/2014   Procedure: OPEN REDUCTION INTERNAL FIXATION (ORIF)  LEFT FEMUR FRACTURE;  Surgeon: [name], MD;   Location: [facility];  Service: Orthopedics;  Laterality: Left;  . TONSILLECTOMY      No Known Allergies  Outpatient Encounter Medications as of 11/06/2017  Medication Sig  . acetaminophen (TYLENOL) 325 MG tablet Take 650 mg by mouth every 6 (six) hours as needed.  Marland Kitchen amLODipine (NORVASC) 5 MG tablet Take 7.5 mg by mouth daily. Give 1.5 tab to =7.5 mg once a day  . ferrous sulfate 325 (65 FE) MG tablet Take 325 mg by mouth 2 (two) times daily with a meal.  . metoprolol (LOPRESSOR) 50 MG tablet Take 1 tablet (50 mg total) by mouth 2 (two) times daily.  . Nutritional Supplements (ENSURE ENLIVE PO) Give once day  . oseltamivir (TAMIFLU) 30 MG capsule Give 1 capsule by mouth  once a day every other day for 2 weeks  . Polyethyl Glycol-Propyl Glycol (SYSTANE) 0.4-0.3 % GEL ophthalmic gel Place 1 application into both eyes 2 (two) times daily at 10 AM and 5 PM.   No facility-administered encounter medications on file as of 11/06/2017.     Review of Systems  Immunization History  Administered Date(s) Administered  . Influenza-Unspecified 08/01/2017  . Pneumococcal Conjugate-13 08/01/2017  . Pneumococcal Polysaccharide-23 06/13/2016  . Tdap 08/05/2014, 05/18/2017   Pertinent  Health Maintenance Due  Topic Date Due  . INFLUENZA VACCINE  Completed  . DEXA SCAN  Completed  . PNA vac Low Risk Adult  Completed   Fall Risk  06/05/2017  Falls in the past year? Yes  Number falls in  past yr: 1  Injury with Fall? Yes   Functional Status Survey:    Vitals:   11/06/17 1203  BP: (!) 134/53  Pulse: 63  Resp: 20  Temp: 97.6 F (36.4 C)  TempSrc: Oral   There is no height or weight on file to calculate BMI. Physical Exam  Labs reviewed: Recent Labs    08/01/17 0740 09/05/17 0430 10/27/17 0734  NA 136 138 138  K 4.4 4.8 4.8  CL 104 105 102  CO2 22 22 22   GLUCOSE 104* 98 103*  BUN 40* 46* 47*  CREATININE 1.96* 2.09* 1.92*  CALCIUM 9.5 8.8* 9.5  PHOS 3.6  --   --    Recent Labs     05/15/17 0600 08/01/17 0740 10/27/17 0734  AST 17 19 20   ALT 11* 13* 13*  ALKPHOS 72 89 86  BILITOT 0.3 0.3 0.3  PROT 6.8 7.2 7.6  ALBUMIN 3.3* 3.9 3.8   Recent Labs    05/15/17 0600 09/05/17 0430 10/27/17 0734  WBC 7.0 5.7 6.3  NEUTROABS 4.1 3.1 3.7  HGB 10.5* 9.6* 10.7*  HCT 32.8* 30.2* 34.9*  MCV 83.7 84.8 84.9  PLT 240 280 321   Lab Results  Component Value Date   TSH 2.515 08/01/2017   No results found for: HGBA1C No results found for: CHOL, HDL, LDLCALC, LDLDIRECT, TRIG, CHOLHDL  Significant Diagnostic Results in last 30 days:  No results found.  Assessment/Plan There are no diagnoses linked to this encounter.      Oralia Manis, [place] [phone]  This encounter was created in error - please disregard.

## 2017-11-28 ENCOUNTER — Encounter: Payer: Self-pay | Admitting: Internal Medicine

## 2017-11-28 ENCOUNTER — Non-Acute Institutional Stay (SKILLED_NURSING_FACILITY): Payer: Medicare HMO | Admitting: Internal Medicine

## 2017-11-28 DIAGNOSIS — D509 Iron deficiency anemia, unspecified: Secondary | ICD-10-CM

## 2017-11-28 DIAGNOSIS — I10 Essential (primary) hypertension: Secondary | ICD-10-CM | POA: Diagnosis not present

## 2017-11-28 DIAGNOSIS — N184 Chronic kidney disease, stage 4 (severe): Secondary | ICD-10-CM

## 2017-11-28 DIAGNOSIS — M810 Age-related osteoporosis without current pathological fracture: Secondary | ICD-10-CM | POA: Diagnosis not present

## 2017-11-28 NOTE — Progress Notes (Signed)
Location:   [facility] Room Number: 113/D Place of Service:  SNF (31) Provider:  Quintella Baton, MD  Patient Care Team: Sinda Du, MD as PCP - General (Pulmonary Disease) [name], MD as Consulting Physician (Gastroenterology)  Extended Emergency Contact Information Primary Emergency Contact: [name] [place] of [place] Phone: [phone] Mobile Phone: [phone] Relation: Niece Secondary Emergency Contact: [place] of [place] Phone: [phone] Relation: Neighbor  Code Status:  Full Code Goals of care: Advanced Directive information Advanced Directives 11/28/2017  Does Patient Have a Medical Advance Directive? Yes  Type of Advance Directive (No Data)  Does patient want to make changes to medical advance directive? No - Patient declined  Copy of [place] in Chart? No - copy requested  Would patient like information on creating a medical advance directive? -     Chief Complaint  Patient presents with  . Medical Management of Chronic Issues    Routine Visit    HPI:  Pt is a 84 y.o. female seen today for medical management of chronic diseases.     Patient has h/o Hypertension, Colorectal cancer s/p Right Colectomy, Anemia, Chronic renal disease.Stage 4, H/o Left Knee and Distal femoral Fracture.  Patient is doing well in facility. Her weight is stable at 112 Lbs. She did c/o Left knee pain which is her chronic Pain. Otherwise she is no New Nursing issues.  Past Medical History:  Diagnosis Date  . Anemia   . Arthritis   . History of blood transfusion    "today is the first time" (08/05/2014)  . Hypertension    Past Surgical History:  Procedure Laterality Date  . APPENDECTOMY    . CATARACT EXTRACTION Left   . CHOLECYSTECTOMY    . COLONOSCOPY N/A 09/26/2014   Procedure: COLONOSCOPY;  Surgeon: [name], MD;  [facility];   Service: Endoscopy;  Laterality: N/A;  1030am  . CYSTECTOMY     "had cyst taken off lower back"  . ESOPHAGOGASTRODUODENOSCOPY N/A 08/22/2014   ERD:[id number] ring at the gastro junction/small HH  . ORIF FEMUR FRACTURE Left 08/07/2014   Procedure: OPEN REDUCTION INTERNAL FIXATION (ORIF)  LEFT FEMUR FRACTURE;  Surgeon: [name], MD;  Location: [facility];  Service: Orthopedics;  Laterality: Left;  . TONSILLECTOMY      No Known Allergies  Outpatient Encounter Medications as of 11/28/2017  Medication Sig  . acetaminophen (TYLENOL) 325 MG tablet Take 650 mg by mouth every 6 (six) hours as needed.  Marland Kitchen amLODipine (NORVASC) 5 MG tablet Take 7.5 mg by mouth daily. Give 1.5 tab to =7.5 mg once a day  . ferrous sulfate 325 (65 FE) MG tablet Take 325 mg by mouth 2 (two) times daily with a meal.  . metoprolol (LOPRESSOR) 50 MG tablet Take 1 tablet (50 mg total) by mouth 2 (two) times daily.  . Nutritional Supplements (ENSURE ENLIVE PO) Give once day  . Polyethyl Glycol-Propyl Glycol (SYSTANE) 0.4-0.3 % GEL ophthalmic gel Place 1 application into both eyes 2 (two) times daily at 10 AM and 5 PM.  . [DISCONTINUED] oseltamivir (TAMIFLU) 30 MG capsule Give 1 capsule by mouth  once a day every other day for 2 weeks   No facility-administered encounter medications on file as of 11/28/2017.      Review of Systems  Review of Systems  Constitutional: Negative for activity change, appetite change, chills, diaphoresis, fatigue and fever.  HENT:  Negative for mouth sores, postnasal drip, rhinorrhea, sinus pain and sore throat.   Respiratory: Negative for apnea, cough, chest tightness, shortness of breath and wheezing.   Cardiovascular: Negative for chest pain, palpitations and leg swelling.  Gastrointestinal: Negative for abdominal distention, abdominal pain, constipation, diarrhea, nausea and vomiting.  Genitourinary: Negative for dysuria and frequency.  Musculoskeletal: Negative for arthralgias, joint swelling  and myalgias.  Skin: Negative for rash.  Neurological: Negative for dizziness, syncope, weakness, light-headedness and numbness.  Psychiatric/Behavioral: Negative for behavioral problems, confusion and sleep disturbance.     Immunization History  Administered Date(s) Administered  . Influenza-Unspecified 08/01/2017  . Pneumococcal Conjugate-13 08/01/2017  . Pneumococcal Polysaccharide-23 06/13/2016  . Tdap 08/05/2014, 05/18/2017   Pertinent  Health Maintenance Due  Topic Date Due  . INFLUENZA VACCINE  Completed  . DEXA SCAN  Completed  . PNA vac Low Risk Adult  Completed   Fall Risk  06/05/2017  Falls in the past year? Yes  Number falls in past yr: 1  Injury with Fall? Yes   Functional Status Survey:    Vitals:   11/28/17 0910  BP: (!) 143/74  Pulse: 87  Resp: 18  Temp: 98.3 F (36.8 C)  TempSrc: Oral  SpO2: 94%  Weight: 112 lb (50.8 kg)  Height: 4\' 11"  (1.499 m)   Body mass index is 22.62 kg/m. Physical Exam  Constitutional: She appears well-developed and well-nourished.  HENT:  Head: Normocephalic.  Mouth/Throat: Oropharynx is clear and moist.  Eyes: Pupils are equal, round, and reactive to light.  Neck: Neck supple.  Cardiovascular: Normal rate, regular rhythm and normal heart sounds.  Pulmonary/Chest: Effort normal and breath sounds normal. No respiratory distress. She has no wheezes. She has no rales.  Abdominal: Soft. Bowel sounds are normal. She exhibits no distension. There is no tenderness. There is no rebound.  Musculoskeletal: She exhibits no edema.  Left Knee had no swelling or Redness. Good Range of Motion. She had Mild tenderness in The lower part of her Thigh.  Lymphadenopathy:    She has no cervical adenopathy.  Neurological: She is alert.  Skin: Skin is warm and dry.  Psychiatric: She has a normal mood and affect. Her behavior is normal. Thought content normal.    Labs reviewed: Recent Labs    08/01/17 0740 09/05/17 0430 10/27/17 0734    NA 136 138 138  K 4.4 4.8 4.8  CL 104 105 102  CO2 22 22 22   GLUCOSE 104* 98 103*  BUN 40* 46* 47*  CREATININE 1.96* 2.09* 1.92*  CALCIUM 9.5 8.8* 9.5  PHOS 3.6  --   --    Recent Labs    05/15/17 0600 08/01/17 0740 10/27/17 0734  AST 17 19 20   ALT 11* 13* 13*  ALKPHOS 72 89 86  BILITOT 0.3 0.3 0.3  PROT 6.8 7.2 7.6  ALBUMIN 3.3* 3.9 3.8   Recent Labs    05/15/17 0600 09/05/17 0430 10/27/17 0734  WBC 7.0 5.7 6.3  NEUTROABS 4.1 3.1 3.7  HGB 10.5* 9.6* 10.7*  HCT 32.8* 30.2* 34.9*  MCV 83.7 84.8 84.9  PLT 240 280 321   Lab Results  Component Value Date   TSH 2.515 08/01/2017   No results found for: HGBA1C No results found for: CHOL, HDL, LDLCALC, LDLDIRECT, TRIG, CHOLHDL  Significant Diagnostic Results in last 30 days:  No results found.  Assessment/Plan  Essential hypertension BP Controlled on Norvasc and Metoprolol.   Chronic kidney disease, stage 4 with Hyperkalemia Stable Creat.  Anemia Due to Chronic Renal disease  On Iron supplement.   Dementia Continue supportive care. Not on Any treatment.  Osteoporosis With Tscore of -2.6 with h/o Fracture .in CKD Stage 4 GFR Less then 35 Start her on Prolia Left Knee pain Benign Exam. Tylenol PRN for Now.   Family/ staff Communication:   Labs/tests ordered:    Total time spent in this patient care encounter was 25_ minutes; greater than 50% of the visit spent counseling patient, reviewing records , Labs and coordinating care for problems addressed at this encounter.

## 2017-12-07 DIAGNOSIS — L603 Nail dystrophy: Secondary | ICD-10-CM | POA: Diagnosis not present

## 2017-12-07 DIAGNOSIS — B351 Tinea unguium: Secondary | ICD-10-CM | POA: Diagnosis not present

## 2017-12-07 DIAGNOSIS — I739 Peripheral vascular disease, unspecified: Secondary | ICD-10-CM | POA: Diagnosis not present

## 2017-12-26 ENCOUNTER — Non-Acute Institutional Stay (SKILLED_NURSING_FACILITY): Payer: Medicare HMO | Admitting: Internal Medicine

## 2017-12-26 ENCOUNTER — Encounter: Payer: Self-pay | Admitting: Internal Medicine

## 2017-12-26 DIAGNOSIS — D509 Iron deficiency anemia, unspecified: Secondary | ICD-10-CM | POA: Diagnosis not present

## 2017-12-26 DIAGNOSIS — N184 Chronic kidney disease, stage 4 (severe): Secondary | ICD-10-CM

## 2017-12-26 DIAGNOSIS — M810 Age-related osteoporosis without current pathological fracture: Secondary | ICD-10-CM

## 2017-12-26 DIAGNOSIS — F0390 Unspecified dementia without behavioral disturbance: Secondary | ICD-10-CM

## 2017-12-26 DIAGNOSIS — I10 Essential (primary) hypertension: Secondary | ICD-10-CM

## 2017-12-26 NOTE — Progress Notes (Signed)
Location:   [facility] Room Number: 113/D Place of Service:  SNF (31) Provider:  Terisa Starr, MD  Patient Care Team: Sinda Du, MD as PCP - General (Pulmonary Disease) [name], MD as Consulting Physician (Gastroenterology)  Extended Emergency Contact Information Primary Emergency Contact: [name] [place] of [place] Phone: [phone] Mobile Phone: [phone] Relation: Niece Secondary Emergency Contact: [place] of [place] Phone: [phone] Relation: Neighbor  Code Status:  Full Code Goals of care: Advanced Directive information Advanced Directives 12/26/2017  Does Patient Have a Medical Advance Directive? Yes  Type of Advance Directive (No Data)  Does patient want to make changes to medical advance directive? No - Patient declined  Copy of [place] in Chart? No - copy requested  Would patient like information on creating a medical advance directive? -     Chief Complaint  Patient presents with  . Medical Management of Chronic Issues    Routine Visit   For medical management of chronic medical conditions including dementia-hypertension- chronic kidney disease-history of left knee distal femur fracture in the past osteoporosis and anemia as well as history of colorectal cancer status post right colectomy--  HPI:  Pt is a 84 y.o. female seen today for medical management of chronic medical conditions as noted above  Nursing has not reported any issues and patient has no complaints other than occasional left knee pain which apparently is helped by Tylenol.  Regards to hypertension she continues on metoprolol and Norvasc and this appears stable I got a manual reading of 132/62- previous readings 124/62-130/60.  She does have a history of chronic kidney disease which also appears stable with a creatinine of 1.92 on lab done in late February this is been  stable for an extended period of time.  She also has associated anemia of chronic renal disease-this is been stable at 10.7 on February lab.  In regards to dementia her weight is stable-she does receive supportive care and appears to be doing well with this.  She also has a history of distal femoral fracture  she is on Prolia with a history of osteoporosis and a T score of -2.6.   she also has a distant history of colon cancer status post colectomy this is not really been an issue during her stay here.  Currently she is resting in her wheelchair comfortably and has no complaints continues to be pleasant soft-spoken       Past Medical History:  Diagnosis Date  . Anemia   . Arthritis   . History of blood transfusion    "today is the first time" (08/05/2014)  . Hypertension    Past Surgical History:  Procedure Laterality Date  . APPENDECTOMY    . CATARACT EXTRACTION Left   . CHOLECYSTECTOMY    . COLONOSCOPY N/A 09/26/2014   Procedure: COLONOSCOPY;  Surgeon: [name], MD;  [facility];  Service: Endoscopy;  Laterality: N/A;  1030am  . CYSTECTOMY     "had cyst taken off lower back"  . ESOPHAGOGASTRODUODENOSCOPY N/A 08/22/2014   IHK:[id number] ring at the gastro junction/small HH  . ORIF FEMUR FRACTURE Left 08/07/2014   Procedure: OPEN REDUCTION INTERNAL FIXATION (ORIF)  LEFT FEMUR FRACTURE;  Surgeon: [name], MD;  Location: [facility];  Service: Orthopedics;  Laterality: Left;  . TONSILLECTOMY      No Known Allergies  Outpatient Encounter Medications as of 12/26/2017  Medication Sig  .  acetaminophen (TYLENOL) 325 MG tablet Take 650 mg by mouth every 6 (six) hours as needed.  Marland Kitchen amLODipine (NORVASC) 5 MG tablet Take 7.5 mg by mouth daily. Give 1.5 tab to =7.5 mg once a day  . denosumab (PROLIA) 60 MG/ML SOSY injection Inject 60 mg into the skin every 6 (six) months.  . ferrous sulfate 325 (65 FE) MG tablet Take 325 mg by mouth 2 (two) times daily with a meal.    . metoprolol (LOPRESSOR) 50 MG tablet Take 1 tablet (50 mg total) by mouth 2 (two) times daily.  . Nutritional Supplements (ENSURE ENLIVE PO) Give once day  . Polyethyl Glycol-Propyl Glycol (SYSTANE) 0.4-0.3 % GEL ophthalmic gel Place 1 application into both eyes 2 (two) times daily at 10 AM and 5 PM.   No facility-administered encounter medications on file as of 12/26/2017.      Review of Systems   This is somewhat limited secondary to patient being a poor historian also provided by nursing.  General she is not complaining of any fever chills weight appears to be stable.  Skin does not complain of rashes or itching.  Head ears eyes nose mouth and throat does not complain of sore throat or visual changes.  Respiratory denies shortness of breath or cough.  Cardiac does not complain of chest pain or significant lower extremity edema.  GI is not complaining of abdominal discomfort nausea vomiting diarrhea or constipation.  GU does not complain of dysuria.  Musculoskeletal at times will complain of left knee discomfort with a history of  osteoarthritis-this appears controlled with Tylenol.  Neurologic is not complaining of dizziness headache or numbness or syncope.  In psych does have a history of dementia but continues to be pleasant cooperative nursing is not noted really any signs of anxiety or depression  Immunization History  Administered Date(s) Administered  . Influenza-Unspecified 08/01/2017  . Pneumococcal Conjugate-13 08/01/2017  . Pneumococcal Polysaccharide-23 06/13/2016  . Tdap 08/05/2014, 05/18/2017   Pertinent  Health Maintenance Due  Topic Date Due  . INFLUENZA VACCINE  04/05/2018  . DEXA SCAN  Completed  . PNA vac Low Risk Adult  Completed   Fall Risk  06/05/2017  Falls in the past year? Yes  Number falls in past yr: 1  Injury with Fall? Yes   Functional Status Survey:    Vitals:   12/26/17 1219  BP: (!) 138/53  Pulse: 63  Resp: 18  Temp: (!)  97.4 F (36.3 C)  TempSrc: Oral  SpO2: 94%  Weight: 110 lb (49.9 kg)  Height: 4\' 11"  (1.499 m)  --Manual blood pressure today was 132/62 Body mass index is 22.22 kg/m. Physical Exam Is in general this is a pleasant elderly female in no distress sitting comfortably in her wheelchair.  Her skin is warm and dry.  Eyes sclera and conjunctive are clear visual acuity appears grossly intact.  Oropharynx is clear mucous membranes moist.  Chest is clear to auscultation there is no labored breathing.  Heart is regular rate and rhythm without murmur gallop or rub she does not really have significant lower extremity edema.  Abdomen is soft nontender with positive bowel sounds.  Musculoskeletal does largely ambulate in a wheelchair moves all extremities x4 at baseline weakness arthritic changes present of her knees bilaterally could not really appreciate significant discomfort with passive range of motion flexion and extension of her knees.  Neurologic is grossly intact her speech is clear no lateralizing findings.  Psych she is oriented to self  pleasant cooperative can carry on a short conversation very soft-spoken Labs reviewed: Recent Labs    08/01/17 0740 09/05/17 0430 10/27/17 0734  NA 136 138 138  K 4.4 4.8 4.8  CL 104 105 102  CO2 22 22 22   GLUCOSE 104* 98 103*  BUN 40* 46* 47*  CREATININE 1.96* 2.09* 1.92*  CALCIUM 9.5 8.8* 9.5  PHOS 3.6  --   --    Recent Labs    05/15/17 0600 08/01/17 0740 10/27/17 0734  AST 17 19 20   ALT 11* 13* 13*  ALKPHOS 72 89 86  BILITOT 0.3 0.3 0.3  PROT 6.8 7.2 7.6  ALBUMIN 3.3* 3.9 3.8   Recent Labs    05/15/17 0600 09/05/17 0430 10/27/17 0734  WBC 7.0 5.7 6.3  NEUTROABS 4.1 3.1 3.7  HGB 10.5* 9.6* 10.7*  HCT 32.8* 30.2* 34.9*  MCV 83.7 84.8 84.9  PLT 240 280 321   Lab Results  Component Value Date   TSH 2.515 08/01/2017   No results found for: HGBA1C No results found for: CHOL, HDL, LDLCALC, LDLDIRECT, TRIG,  CHOLHDL  Significant Diagnostic Results in last 30 days:  No results found.  Assessment/Plan   #1-hypertension this appears stable as noted above she is on Norvasc and metoprolol.  -- #2 history of chronic kidney disease as noted above this appears stable with recent creatinine of 1.92 BUN 47 which is relatively stable and has been stable now for some time per chart review #4   #3 history of anemia of chronic disease again this has shown stability with a hemoglobin of 10.7 she is on iron  History of dementia again she is doing well with supportive care her weight is stable at around 110 pounds.  5.  History of osteoporosis with previous l femoral fracture she is on Prolia T score was -2.6.  6.  History of chronic left knee pain this appears to be alleviated largely with Tylenol according to nursing as well as patient at this point will monitor.  7.  History of colon cancer status post colectomy in the past she appears to be doing well clinically in this regards asymptomatic.  [id number]

## 2018-01-15 ENCOUNTER — Encounter: Payer: Self-pay | Admitting: Internal Medicine

## 2018-01-15 ENCOUNTER — Non-Acute Institutional Stay (SKILLED_NURSING_FACILITY): Payer: Medicare HMO | Admitting: Internal Medicine

## 2018-01-15 DIAGNOSIS — F0390 Unspecified dementia without behavioral disturbance: Secondary | ICD-10-CM

## 2018-01-15 DIAGNOSIS — M810 Age-related osteoporosis without current pathological fracture: Secondary | ICD-10-CM

## 2018-01-15 DIAGNOSIS — N184 Chronic kidney disease, stage 4 (severe): Secondary | ICD-10-CM | POA: Diagnosis not present

## 2018-01-15 DIAGNOSIS — C189 Malignant neoplasm of colon, unspecified: Secondary | ICD-10-CM

## 2018-01-15 DIAGNOSIS — D509 Iron deficiency anemia, unspecified: Secondary | ICD-10-CM | POA: Diagnosis not present

## 2018-01-15 DIAGNOSIS — I10 Essential (primary) hypertension: Secondary | ICD-10-CM | POA: Diagnosis not present

## 2018-01-15 NOTE — Progress Notes (Signed)
Location:   [facility] Room Number: 113/D Place of Service:  SNF (31) Provider:  Terisa Starr, MD  Patient Care Team: Sinda Du, MD as PCP - General (Pulmonary Disease) [name], MD as Consulting Physician (Gastroenterology)  Extended Emergency Contact Information Primary Emergency Contact: [name] [place] of [place] Phone: [phone] Mobile Phone: [phone] Relation: Niece Secondary Emergency Contact: [place] of [place] Phone: [phone] Relation: Neighbor  Code Status:  Full Code Goals of care: Advanced Directive information Advanced Directives 01/15/2018  Does Patient Have a Medical Advance Directive? Yes  Type of Advance Directive (No Data)  Does patient want to make changes to medical advance directive? No - Patient declined  Copy of [place] in Chart? No - copy requested  Would patient like information on creating a medical advance directive? -     Chief Complaint  Patient presents with  . Medical Management of Chronic Issues    Routine Visit   For medical management of chronic medical conditions including dementia-hypertension-chronic kidney disease- history of osteoporosis with left knee distal femoral fracture in the past-anemia- as well as history of colorectal cancer status post right colectomy  HPI:  Pt is a 84 y.o. female seen today for medical management of chronic diseases.--As noted above.  She continues to be quite stable nursing is not really reported any recent issues at times she still continues to complain of some left knee discomfort and Tylenol is helping.  She does have a history of hypertension is on Norvasc as well as metoprolol this appears to be stable with recent blood pressures 118/57-130/71- 140/55 --139/59  She also has a history of chronic kidney disease which appears relatively stable with creatinine back in  February of 1.92 this is been stable now for an extended period of time.  In regards to anemia this is most likely chronic disease and has also been stable with a hemoglobin on February lab of 10.7.  She does have a history of dementia but does well with supportive care weight stable at around 111 pounds.  Currently she is sitting in her wheelchair comfortably And expresses no acute complaints  Past Medical History:  Diagnosis Date  . Anemia   . Arthritis   . History of blood transfusion    "today is the first time" (08/05/2014)  . Hypertension    Past Surgical History:  Procedure Laterality Date  . APPENDECTOMY    . CATARACT EXTRACTION Left   . CHOLECYSTECTOMY    . COLONOSCOPY N/A 09/26/2014   Procedure: COLONOSCOPY;  Surgeon: [name], MD;  [facility];  Service: Endoscopy;  Laterality: N/A;  1030am  . CYSTECTOMY     "had cyst taken off lower back"  . ESOPHAGOGASTRODUODENOSCOPY N/A 08/22/2014   BMW:[id number] ring at the gastro junction/small HH  . ORIF FEMUR FRACTURE Left 08/07/2014   Procedure: OPEN REDUCTION INTERNAL FIXATION (ORIF)  LEFT FEMUR FRACTURE;  Surgeon: [name], MD;  Location: [facility];  Service: Orthopedics;  Laterality: Left;  . TONSILLECTOMY      No Known Allergies  Outpatient Encounter Medications as of 01/15/2018  Medication Sig  . acetaminophen (TYLENOL) 325 MG tablet Take 650 mg by mouth every 6 (six) hours as needed.  Marland Kitchen amLODipine (NORVASC) 5 MG tablet Take 7.5 mg by mouth daily. Give 1.5 tab to =7.5 mg once a day  . denosumab (PROLIA) 60 MG/ML SOSY injection Inject 60 mg  into the skin every 6 (six) months.  . ferrous sulfate 325 (65 FE) MG tablet Take 325 mg by mouth 2 (two) times daily with a meal.  . metoprolol (LOPRESSOR) 50 MG tablet Take 1 tablet (50 mg total) by mouth 2 (two) times daily.  . Nutritional Supplements (ENSURE ENLIVE PO) Give once day  . Polyethyl Glycol-Propyl Glycol (SYSTANE) 0.4-0.3 % GEL ophthalmic gel Place  1 application into both eyes 2 (two) times daily at 10 AM and 5 PM.   No facility-administered encounter medications on file as of 01/15/2018.      Review of Systems--this is somewhat limited since patient is somewhat of a poor historian with history of dementia.  Provided by nursing as well.  General she does not complain of any fever chills her weight is stable.  Skin does not complain of rashes itching or diaphoresis.  Head ears eyes nose mouth and throat is not complaining of any sore throat difficulty swallowing or visual changes.  Respiratory is not complaining of shortness of breath or cough.  Cardiac denies chest pain does not have significant lower extremity edema.  GU is not complaining of dysuria.  Musculoskeletal at times complains of some knee pain more so on the left but this appears relatively baseline and again she says that Tylenol does help.  Neurologic is not complaining of dizziness headache numbness or syncope.  Psych has a history of dementia but is very pleasant cooperative no behaviors have been really noted.    Immunization History  Administered Date(s) Administered  . Influenza-Unspecified 08/01/2017  . Pneumococcal Conjugate-13 08/01/2017  . Pneumococcal Polysaccharide-23 06/13/2016  . Tdap 08/05/2014, 05/18/2017   Pertinent  Health Maintenance Due  Topic Date Due  . INFLUENZA VACCINE  04/05/2018  . DEXA SCAN  Completed  . PNA vac Low Risk Adult  Completed   Fall Risk  06/05/2017  Falls in the past year? Yes  Number falls in past yr: 1  Injury with Fall? Yes   Functional Status Survey:    Vitals:   01/15/18 1514  BP: (!) 139/59  Pulse: 70  Resp: 18  Temp: (!) 96.9 F (36.1 C)  TempSrc: Oral  SpO2: 94%  Weight: 111 lb 12.8 oz (50.7 kg)  Height: 4\' 11"  (1.499 m)   Body mass index is 22.58 kg/m. Physical Exam   In general this is a pleasant elderly female in no distress sitting comfortably in her wheelchair.  Her skin is warm  and dry.  Eyes sclera and conjunctive are clear visual acuity appears grossly intact.  Oropharynx clear mucous membranes moist.  Chest is clear to auscultation there is no labored breathing.  Heart is regular rate and rhythm without murmur gallop or rub she does not really have any significant lower extremity edema.  Her abdomen soft nontender with positive bowel sounds.  Musculoskeletal continues to largely ablate in a wheelchair moves her extremities x4 at baseline she does have arthritic changes of her knees bilaterally but does not really appear to have significant discomfort with gentle range of motion or palpitation of the knees.  Neurologic is grossly intact her speech is clear no lateralizing findings.    psych she is oriented to self she is pleasant cooperative continues able to carry on a short conversation  Labs reviewed: Recent Labs    08/01/17 0740 09/05/17 0430 10/27/17 0734  NA 136 138 138  K 4.4 4.8 4.8  CL 104 105 102  CO2 22 22 22   GLUCOSE 104* 98  103*  BUN 40* 46* 47*  CREATININE 1.96* 2.09* 1.92*  CALCIUM 9.5 8.8* 9.5  PHOS 3.6  --   --    Recent Labs    05/15/17 0600 08/01/17 0740 10/27/17 0734  AST 17 19 20   ALT 11* 13* 13*  ALKPHOS 72 89 86  BILITOT 0.3 0.3 0.3  PROT 6.8 7.2 7.6  ALBUMIN 3.3* 3.9 3.8   Recent Labs    05/15/17 0600 09/05/17 0430 10/27/17 0734  WBC 7.0 5.7 6.3  NEUTROABS 4.1 3.1 3.7  HGB 10.5* 9.6* 10.7*  HCT 32.8* 30.2* 34.9*  MCV 83.7 84.8 84.9  PLT 240 280 321   Lab Results  Component Value Date   TSH 2.515 08/01/2017   No results found for: HGBA1C No results found for: CHOL, HDL, LDLCALC, LDLDIRECT, TRIG, CHOLHDL  Significant Diagnostic Results in last 30 days:  No results found.  Assessment/Plan  #1-  History of chronic kidney disease this remains stable with creatinine of 1.92 BUN 47 will continue to monitor periodically this has been stable for an extended period of time.  2.  Hypertension this  appears stable as well she is on Norvasc and metoprolol.  3.  History of dementia she is doing well with supportive care weight is stable at about 111 pounds.  4.  Anemia this is stable as well with a hemoglobin of 10.7 she is on iron this I suspect also has an element of chronic disease.  5.  History of osteoporosis with previous femoral fracture she is on Prolia T score was -2.6.  6.  History of chronic left knee pain this appears to be at baseline again Tylenol is helping.  7.  History of colon cancer status post colectomy in the past-continues to be asymptomatic he has not really been an issue during her stay here as long as we have been monitoring   [phone]

## 2018-01-21 ENCOUNTER — Encounter: Payer: Self-pay | Admitting: Internal Medicine

## 2018-02-17 DIAGNOSIS — R109 Unspecified abdominal pain: Secondary | ICD-10-CM | POA: Diagnosis not present

## 2018-02-18 ENCOUNTER — Encounter (HOSPITAL_COMMUNITY)
Admission: RE | Admit: 2018-02-18 | Discharge: 2018-02-18 | Disposition: A | Discharge status: Home / Self Care | Payer: Medicare HMO | Source: Skilled Nursing Facility | Attending: Internal Medicine | Admitting: Internal Medicine

## 2018-02-18 DIAGNOSIS — N189 Chronic kidney disease, unspecified: Secondary | ICD-10-CM | POA: Diagnosis not present

## 2018-02-18 LAB — CBC WITH DIFFERENTIAL/PLATELET
Basophils Absolute: 0 10*3/uL (ref 0.0–0.1)
Basophils Relative: 0 %
Eosinophils Absolute: 0.2 10*3/uL (ref 0.0–0.7)
Eosinophils Relative: 2 %
HEMATOCRIT: 32.3 % — AB (ref 36.0–46.0)
HEMOGLOBIN: 10.1 g/dL — AB (ref 12.0–15.0)
Lymphocytes Relative: 33 %
Lymphs Abs: 2.2 10*3/uL (ref 0.7–4.0)
MCH: 26.6 pg (ref 26.0–34.0)
MCHC: 31.3 g/dL (ref 30.0–36.0)
MCV: 85 fL (ref 78.0–100.0)
Monocytes Absolute: 0.6 10*3/uL (ref 0.1–1.0)
Monocytes Relative: 9 %
Neutro Abs: 3.8 10*3/uL (ref 1.7–7.7)
Neutrophils Relative %: 56 %
Platelets: 312 10*3/uL (ref 150–400)
RBC: 3.8 MIL/uL — ABNORMAL LOW (ref 3.87–5.11)
RDW: 13.6 % (ref 11.5–15.5)
WBC: 6.8 10*3/uL (ref 4.0–10.5)

## 2018-02-18 LAB — COMPREHENSIVE METABOLIC PANEL
ALT: 12 U/L — ABNORMAL LOW (ref 14–54)
AST: 17 U/L (ref 15–41)
Albumin: 3.8 g/dL (ref 3.5–5.0)
Alkaline Phosphatase: 74 U/L (ref 38–126)
Anion gap: 9 (ref 5–15)
BILIRUBIN TOTAL: 0.4 mg/dL (ref 0.3–1.2)
BUN: 54 mg/dL — AB (ref 6–20)
CO2: 24 mmol/L (ref 22–32)
Calcium: 9.1 mg/dL (ref 8.9–10.3)
Chloride: 105 mmol/L (ref 101–111)
Creatinine, Ser: 1.95 mg/dL — ABNORMAL HIGH (ref 0.44–1.00)
GFR calc Af Amer: 25 mL/min — ABNORMAL LOW (ref >60)
GFR calc non Af Amer: 21 mL/min — ABNORMAL LOW (ref >60)
GLUCOSE: 122 mg/dL — AB (ref 65–99)
Potassium: 5.4 mmol/L — ABNORMAL HIGH (ref 3.5–5.1)
Sodium: 138 mmol/L (ref 135–145)
TOTAL PROTEIN: 7.1 g/dL (ref 6.5–8.1)

## 2018-02-18 LAB — LIPASE, BLOOD: Lipase: 49 U/L (ref 11–51)

## 2018-02-19 ENCOUNTER — Encounter: Payer: Self-pay | Admitting: Internal Medicine

## 2018-02-19 ENCOUNTER — Non-Acute Institutional Stay (SKILLED_NURSING_FACILITY): Payer: Medicare HMO | Admitting: Internal Medicine

## 2018-02-19 DIAGNOSIS — F0390 Unspecified dementia without behavioral disturbance: Secondary | ICD-10-CM | POA: Diagnosis not present

## 2018-02-19 DIAGNOSIS — M810 Age-related osteoporosis without current pathological fracture: Secondary | ICD-10-CM

## 2018-02-19 DIAGNOSIS — D509 Iron deficiency anemia, unspecified: Secondary | ICD-10-CM | POA: Diagnosis not present

## 2018-02-19 DIAGNOSIS — I10 Essential (primary) hypertension: Secondary | ICD-10-CM | POA: Diagnosis not present

## 2018-02-19 DIAGNOSIS — N184 Chronic kidney disease, stage 4 (severe): Secondary | ICD-10-CM

## 2018-02-19 NOTE — Progress Notes (Deleted)
Location:   [facility] Room Number: 113/D Place of Service:  SNF [phone]) Provider:  Quintella Baton, MD  Patient Care Team: Sinda Du, MD as PCP - General (Pulmonary Disease) [name], MD as Consulting Physician (Gastroenterology)  Extended Emergency Contact Information Primary Emergency Contact: [name] [place] of [place] Phone: [phone] Mobile Phone: [phone] Relation: Niece Secondary Emergency Contact: [place] of [place] Phone: [phone] Relation: Neighbor  Code Status:  Full Code Goals of care: Advanced Directive information Advanced Directives 01/15/2018  Does Patient Have a Medical Advance Directive? Yes  Type of Advance Directive (No Data)  Does patient want to make changes to medical advance directive? No - Patient declined  Copy of [place] in Chart? No - copy requested  Would patient like information on creating a medical advance directive? -     Chief Complaint  Patient presents with  . Acute Visit    Patient being seen for Abdominal Pain     HPI:  Pt is a 84 y.o. female seen today for an acute visit for    Past Medical History:  Diagnosis Date  . Anemia   . Arthritis   . History of blood transfusion    "today is the first time" (08/05/2014)  . Hypertension    Past Surgical History:  Procedure Laterality Date  . APPENDECTOMY    . CATARACT EXTRACTION Left   . CHOLECYSTECTOMY    . COLONOSCOPY N/A 09/26/2014   Procedure: COLONOSCOPY;  Surgeon: [name], MD;  [facility];  Service: Endoscopy;  Laterality: N/A;  1030am  . CYSTECTOMY     "had cyst taken off lower back"  . ESOPHAGOGASTRODUODENOSCOPY N/A 08/22/2014   QIO:[id number] ring at the gastro junction/small HH  . ORIF FEMUR FRACTURE Left 08/07/2014   Procedure: OPEN REDUCTION INTERNAL FIXATION (ORIF)  LEFT FEMUR FRACTURE;  Surgeon: [name], MD;   Location: [facility];  Service: Orthopedics;  Laterality: Left;  . TONSILLECTOMY      No Known Allergies  Outpatient Encounter Medications as of 02/19/2018  Medication Sig  . acetaminophen (TYLENOL) 325 MG tablet Take 650 mg by mouth every 6 (six) hours as needed.  Marland Kitchen amLODipine (NORVASC) 5 MG tablet Take 7.5 mg by mouth daily. Give 1.5 tab to =7.5 mg once a day  . denosumab (PROLIA) 60 MG/ML SOSY injection Inject 60 mg into the skin every 6 (six) months.  . ferrous sulfate 325 (65 FE) MG tablet Take 325 mg by mouth 2 (two) times daily with a meal.  . metoprolol (LOPRESSOR) 50 MG tablet Take 1 tablet (50 mg total) by mouth 2 (two) times daily.  . Nutritional Supplements (ENSURE ENLIVE PO) Give once day  . Polyethyl Glycol-Propyl Glycol (SYSTANE) 0.4-0.3 % GEL ophthalmic gel Place 1 application into both eyes 2 (two) times daily at 10 AM and 5 PM.   No facility-administered encounter medications on file as of 02/19/2018.      Review of Systems  Immunization History  Administered Date(s) Administered  . Influenza-Unspecified 08/01/2017  . Pneumococcal Conjugate-13 08/01/2017  . Pneumococcal Polysaccharide-23 06/13/2016  . Tdap 08/05/2014, 05/18/2017   Pertinent  Health Maintenance Due  Topic Date Due  . INFLUENZA VACCINE  04/05/2018  . DEXA SCAN  Completed  . PNA vac Low Risk Adult  Completed   Fall Risk  06/05/2017  Falls in the past year? Yes  Number falls in past yr: 1  Injury with Fall? Yes   Functional Status Survey:    Vitals:   02/19/18 1021  BP: 140/62  Pulse: 63  Resp: 20  Temp: 97.6 F (36.4 C)  TempSrc: Oral  SpO2: 95%   There is no height or weight on file to calculate BMI. Physical Exam  Labs reviewed: Recent Labs    08/01/17 0740 09/05/17 0430 10/27/17 0734 02/18/18 1626  NA 136 138 138 138  K 4.4 4.8 4.8 5.4*  CL 104 105 102 105  CO2 22 22 22 24   GLUCOSE 104* 98 103* 122*  BUN 40* 46* 47* 54*  CREATININE 1.96* 2.09* 1.92* 1.95*  CALCIUM 9.5  8.8* 9.5 9.1  PHOS 3.6  --   --   --    Recent Labs    08/01/17 0740 10/27/17 0734 02/18/18 1626  AST 19 20 17   ALT 13* 13* 12*  ALKPHOS 89 86 74  BILITOT 0.3 0.3 0.4  PROT 7.2 7.6 7.1  ALBUMIN 3.9 3.8 3.8   Recent Labs    09/05/17 0430 10/27/17 0734 02/18/18 1626  WBC 5.7 6.3 6.8  NEUTROABS 3.1 3.7 3.8  HGB 9.6* 10.7* 10.1*  HCT 30.2* 34.9* 32.3*  MCV 84.8 84.9 85.0  PLT 280 321 312   Lab Results  Component Value Date   TSH 2.515 08/01/2017   No results found for: HGBA1C No results found for: CHOL, HDL, LDLCALC, LDLDIRECT, TRIG, CHOLHDL  Significant Diagnostic Results in last 30 days:  No results found.  Assessment/Plan There are no diagnoses linked to this encounter.   Family/ staff Communication:   Labs/tests ordered:

## 2018-02-19 NOTE — Progress Notes (Signed)
Location:   [facility] Room Number: 113/D Place of Service:  SNF [phone]) Provider:  Quintella Baton, MD  Patient Care Team: Sinda Du, MD as PCP - General (Pulmonary Disease) [name], MD as Consulting Physician (Gastroenterology)  Extended Emergency Contact Information Primary Emergency Contact: [name] [place] of [place] Phone: [phone] Mobile Phone: [phone] Relation: Niece Secondary Emergency Contact: [place] of [place] Phone: [phone] Relation: Neighbor  Code Status:  Full Code Goals of care: Advanced Directive information Advanced Directives 02/19/2018  Does Patient Have a Medical Advance Directive? Yes  Type of Advance Directive (No Data)  Does patient want to make changes to medical advance directive? No - Patient declined  Copy of [place] in Chart? No - copy requested  Would patient like information on creating a medical advance directive? -     Chief Complaint  Patient presents with  . Medical Management of Chronic Issues     Patient being seen for Routine Visit    HPI:  Pt is a 84 y.o. female seen today for medical management of chronic diseases.    Patient has h/o Hypertension, Colorectal cancer s/p Right Colectomy, Anemia, Chronic renal disease.Stage 4, H/o Left Knee and Distal femoral Fracture. Patient was c/o  Generalized Abdominal Pain yesterday. She had KUB done in Facility which did not show any Acute process. Her labs were negative for any acute change. She did move her Bowels yesterday and today  her pain is resolved now. Denies any Nausea or vomiting. She did eat good breakfast today. Patient cannot give detail history due to her Dementia  Patient otherwise has been stable in facility. No new Nursing issues. Her weight is stable at 112 lbs.     Past Medical History:  Diagnosis Date  . Anemia   . Arthritis   .  History of blood transfusion    "today is the first time" (08/05/2014)  . Hypertension    Past Surgical History:  Procedure Laterality Date  . APPENDECTOMY    . CATARACT EXTRACTION Left   . CHOLECYSTECTOMY    . COLONOSCOPY N/A 09/26/2014   Procedure: COLONOSCOPY;  Surgeon: [name], MD;  [facility];  Service: Endoscopy;  Laterality: N/A;  1030am  . CYSTECTOMY     "had cyst taken off lower back"  . ESOPHAGOGASTRODUODENOSCOPY N/A 08/22/2014   [id number] ring at the gastro junction/small HH  . ORIF FEMUR FRACTURE Left 08/07/2014   Procedure: OPEN REDUCTION INTERNAL FIXATION (ORIF)  LEFT FEMUR FRACTURE;  Surgeon: [name], MD;  Location: [facility];  Service: Orthopedics;  Laterality: Left;  . TONSILLECTOMY      No Known Allergies  Outpatient Encounter Medications as of 02/19/2018  Medication Sig  . acetaminophen (TYLENOL) 325 MG tablet Take 650 mg by mouth every 6 (six) hours as needed.  Marland Kitchen amLODipine (NORVASC) 5 MG tablet Take 7.5 mg by mouth daily. Give 1.5 tab to =7.5 mg once a day  . denosumab (PROLIA) 60 MG/ML SOSY injection Inject 60 mg into the skin every 6 (six) months.  . ferrous sulfate 325 (65 FE) MG tablet Take 325 mg by mouth 2 (two) times daily with a meal.  . metoprolol (LOPRESSOR) 50 MG tablet Take 1 tablet (50 mg total) by mouth 2 (two) times daily.  . Nutritional Supplements (ENSURE ENLIVE PO) Give once day  . Polyethyl Glycol-Propyl Glycol (SYSTANE) 0.4-0.3 % GEL ophthalmic gel Place 1  application into both eyes 2 (two) times daily at 10 AM and 5 PM.   No facility-administered encounter medications on file as of 02/19/2018.      Review of Systems  Review of Systems  Constitutional: Negative for activity change, appetite change, chills, diaphoresis, fatigue and fever.  HENT: Negative for mouth sores, postnasal drip, rhinorrhea, sinus pain and sore throat.   Respiratory: Negative for apnea, cough, chest tightness, shortness of breath and  wheezing.   Cardiovascular: Negative for chest pain, palpitations and leg swelling.  Gastrointestinal: Negative for abdominal distention, abdominal pain, constipation, diarrhea, nausea and vomiting.  Genitourinary: Negative for dysuria and frequency.  Musculoskeletal: Negative for arthralgias, joint swelling and myalgias.  Skin: Negative for rash.  Neurological: Negative for dizziness, syncope, weakness, light-headedness and numbness.  Psychiatric/Behavioral: Negative for behavioral problems, confusion and sleep disturbance.     Immunization History  Administered Date(s) Administered  . Influenza-Unspecified 08/01/2017  . Pneumococcal Conjugate-13 08/01/2017  . Pneumococcal Polysaccharide-23 06/13/2016  . Tdap 08/05/2014, 05/18/2017   Pertinent  Health Maintenance Due  Topic Date Due  . INFLUENZA VACCINE  04/05/2018  . DEXA SCAN  Completed  . PNA vac Low Risk Adult  Completed   Fall Risk  06/05/2017  Falls in the past year? Yes  Number falls in past yr: 1  Injury with Fall? Yes   Functional Status Survey:    Vitals:   02/19/18 1021  BP: 140/62  Pulse: 63  Resp: 20  Temp: 97.6 F (36.4 C)  TempSrc: Oral  SpO2: 95%   There is no height or weight on file to calculate BMI. Physical Exam  Constitutional: She appears well-developed and well-nourished.  HENT:  Head: Normocephalic.  Mouth/Throat: Oropharynx is clear and moist.  Eyes: Pupils are equal, round, and reactive to light.  Neck: Neck supple.  Cardiovascular: Normal rate and regular rhythm.  Pulmonary/Chest: Effort normal and breath sounds normal.  Abdominal: Soft. Bowel sounds are normal. She exhibits no distension. There is no tenderness. There is no guarding.  Musculoskeletal: She exhibits no edema.  Neurological: She is alert.  Skin: Skin is warm and dry.  Psychiatric: She has a normal mood and affect. Her behavior is normal. Thought content normal.    Labs reviewed: Recent Labs    08/01/17 0740  09/05/17 0430 10/27/17 0734 02/18/18 1626  NA 136 138 138 138  K 4.4 4.8 4.8 5.4*  CL 104 105 102 105  CO2 22 22 22 24   GLUCOSE 104* 98 103* 122*  BUN 40* 46* 47* 54*  CREATININE 1.96* 2.09* 1.92* 1.95*  CALCIUM 9.5 8.8* 9.5 9.1  PHOS 3.6  --   --   --    Recent Labs    08/01/17 0740 10/27/17 0734 02/18/18 1626  AST 19 20 17   ALT 13* 13* 12*  ALKPHOS 89 86 74  BILITOT 0.3 0.3 0.4  PROT 7.2 7.6 7.1  ALBUMIN 3.9 3.8 3.8   Recent Labs    09/05/17 0430 10/27/17 0734 02/18/18 1626  WBC 5.7 6.3 6.8  NEUTROABS 3.1 3.7 3.8  HGB 9.6* 10.7* 10.1*  HCT 30.2* 34.9* 32.3*  MCV 84.8 84.9 85.0  PLT 280 321 312   Lab Results  Component Value Date   TSH 2.515 08/01/2017   No results found for: HGBA1C No results found for: CHOL, HDL, LDLCALC, LDLDIRECT, TRIG, CHOLHDL  Significant Diagnostic Results in last 30 days:  No results found.  Assessment/Plan  Generalized Abdominal Pain Now resolved. Her Labs including Hepatic Panel  and Lipase was normal. Will decrease her Iron to QD   Essential hypertension BPControlled on Norvasc and Metoprolol.  Chronic kidney disease, stage 4with Hyperkalemia Stable [email or web address] worse with high Potassium Will repeat Bmp in 1 week.  Anemia Due to Chronic Renal disease  On Iron supplement. Will decrease to QD as she has been c/o Abdominal Discomfort  Dementia Continue supportive care. Not on Any treatment.  Osteoporosis With Tscore of -2.6 with h/o Fracture .in CKD Stage 4 GFR Less then 35 Started on Prolia  Family/ staff Communication:   Labs/tests ordered:    Total time spent in this patient care encounter was _25 minutes; greater than 50% of the visit spent counseling patient, reviewing records , Labs and coordinating care for problems addressed at this encounter.

## 2018-02-26 ENCOUNTER — Encounter (HOSPITAL_COMMUNITY)
Admission: RE | Admit: 2018-02-26 | Discharge: 2018-02-26 | Disposition: A | Discharge status: Home / Self Care | Payer: Medicare HMO | Source: Skilled Nursing Facility | Attending: *Deleted | Admitting: *Deleted

## 2018-02-26 DIAGNOSIS — N189 Chronic kidney disease, unspecified: Secondary | ICD-10-CM | POA: Diagnosis not present

## 2018-02-26 LAB — BASIC METABOLIC PANEL
ANION GAP: 11 (ref 5–15)
BUN: 45 mg/dL — AB (ref 6–20)
CHLORIDE: 106 mmol/L (ref 101–111)
CO2: 23 mmol/L (ref 22–32)
Calcium: 9.6 mg/dL (ref 8.9–10.3)
Creatinine, Ser: 1.85 mg/dL — ABNORMAL HIGH (ref 0.44–1.00)
GFR calc Af Amer: 26 mL/min — ABNORMAL LOW (ref >60)
GFR, EST NON AFRICAN AMERICAN: 23 mL/min — AB (ref >60)
GLUCOSE: 167 mg/dL — AB (ref 65–99)
POTASSIUM: 4.8 mmol/L (ref 3.5–5.1)
Sodium: 140 mmol/L (ref 135–145)

## 2018-03-05 ENCOUNTER — Non-Acute Institutional Stay (SKILLED_NURSING_FACILITY): Payer: Medicare HMO | Admitting: Internal Medicine

## 2018-03-05 ENCOUNTER — Ambulatory Visit (HOSPITAL_COMMUNITY)
Admit: 2018-03-05 | Discharge: 2018-03-05 | Disposition: A | Discharge status: Home / Self Care | Payer: Medicare HMO | Source: Ambulatory Visit | Attending: Internal Medicine | Admitting: Internal Medicine

## 2018-03-05 ENCOUNTER — Encounter (HOSPITAL_COMMUNITY)
Admission: RE | Admit: 2018-03-05 | Discharge: 2018-03-05 | Disposition: A | Discharge status: Home / Self Care | Payer: Medicare HMO | Source: Skilled Nursing Facility | Attending: Internal Medicine | Admitting: Internal Medicine

## 2018-03-05 ENCOUNTER — Encounter: Payer: Self-pay | Admitting: Internal Medicine

## 2018-03-05 DIAGNOSIS — R109 Unspecified abdominal pain: Secondary | ICD-10-CM | POA: Insufficient documentation

## 2018-03-05 DIAGNOSIS — N189 Chronic kidney disease, unspecified: Secondary | ICD-10-CM | POA: Diagnosis not present

## 2018-03-05 DIAGNOSIS — R1084 Generalized abdominal pain: Secondary | ICD-10-CM

## 2018-03-05 DIAGNOSIS — B351 Tinea unguium: Secondary | ICD-10-CM | POA: Diagnosis not present

## 2018-03-05 DIAGNOSIS — R1030 Lower abdominal pain, unspecified: Secondary | ICD-10-CM | POA: Diagnosis not present

## 2018-03-05 DIAGNOSIS — R262 Difficulty in walking, not elsewhere classified: Secondary | ICD-10-CM | POA: Diagnosis not present

## 2018-03-05 DIAGNOSIS — I739 Peripheral vascular disease, unspecified: Secondary | ICD-10-CM | POA: Diagnosis not present

## 2018-03-05 LAB — CBC WITH DIFFERENTIAL/PLATELET
BASOS ABS: 0 10*3/uL (ref 0.0–0.1)
Basophils Relative: 1 %
EOS ABS: 0.2 10*3/uL (ref 0.0–0.7)
Eosinophils Relative: 2 %
HCT: 33.4 % — ABNORMAL LOW (ref 36.0–46.0)
Hemoglobin: 10.6 g/dL — ABNORMAL LOW (ref 12.0–15.0)
LYMPHS PCT: 28 %
Lymphs Abs: 2.3 10*3/uL (ref 0.7–4.0)
MCH: 27.1 pg (ref 26.0–34.0)
MCHC: 31.7 g/dL (ref 30.0–36.0)
MCV: 85.4 fL (ref 78.0–100.0)
Monocytes Absolute: 1 10*3/uL (ref 0.1–1.0)
Monocytes Relative: 13 %
Neutro Abs: 4.7 10*3/uL (ref 1.7–7.7)
Neutrophils Relative %: 56 %
Platelets: 325 10*3/uL (ref 150–400)
RBC: 3.91 MIL/uL (ref 3.87–5.11)
RDW: 13.6 % (ref 11.5–15.5)
WBC: 8.3 10*3/uL (ref 4.0–10.5)

## 2018-03-05 LAB — COMPREHENSIVE METABOLIC PANEL
ALT: 12 U/L (ref 0–44)
AST: 18 U/L (ref 15–41)
Albumin: 4 g/dL (ref 3.5–5.0)
Alkaline Phosphatase: 78 U/L (ref 38–126)
Anion gap: 9 (ref 5–15)
BUN: 48 mg/dL — ABNORMAL HIGH (ref 8–23)
CHLORIDE: 104 mmol/L (ref 98–111)
CO2: 24 mmol/L (ref 22–32)
Calcium: 9.3 mg/dL (ref 8.9–10.3)
Creatinine, Ser: 1.94 mg/dL — ABNORMAL HIGH (ref 0.44–1.00)
GFR calc Af Amer: 25 mL/min — ABNORMAL LOW (ref >60)
GFR calc non Af Amer: 21 mL/min — ABNORMAL LOW (ref >60)
GLUCOSE: 115 mg/dL — AB (ref 70–99)
Potassium: 5.1 mmol/L (ref 3.5–5.1)
Sodium: 137 mmol/L (ref 135–145)
Total Bilirubin: 0.4 mg/dL (ref 0.3–1.2)
Total Protein: 7.8 g/dL (ref 6.5–8.1)

## 2018-03-05 LAB — LIPASE, BLOOD: Lipase: 54 U/L — ABNORMAL HIGH (ref 11–51)

## 2018-03-05 NOTE — Progress Notes (Signed)
Location:   [facility] Room Number: 113/D Place of Service:  SNF [phone]) Provider:  Terisa Starr, MD  Patient Care Team: Sinda Du, MD as PCP - General (Pulmonary Disease) [name], MD as Consulting Physician (Gastroenterology)  Extended Emergency Contact Information Primary Emergency Contact: [name] Phone: [phone] Mobile Phone: [phone] Relation: Niece Secondary Emergency Contact: [place] Phone: [phone] Relation: Neighbor  Code Status:  Full Code Goals of care: Advanced Directive information Advanced Directives 03/05/2018  Does Patient Have a Medical Advance Directive? Yes  Type of Advance Directive (No Data)  Does patient want to make changes to medical advance directive? No - Patient declined  Copy of [place] in Chart? No - copy requested  Would patient like information on creating a medical advance directive? -     Chief Complaint  Patient presents with  . Acute Visit    Patients c/o Abdominal Pain    HPI:  Pt is a 84 y.o. female seen today for an acute visit for complaints of abdominal pain.  Apparently she ate a decent breakfast this morning but but later complained to staff of abdominal discomfort.  She says she is feeling better now   States she has a couple bowel movements this morning--   Apparently there was some nausea but no vomiting she was seen a couple weeks ago by Dr. Lyndel Safe for what appears to be similar complaints.  At that time abdominal x-ray and blood work was unremarkable and abdominal pain resolved  She has a history of hypertension as well as colorectal cancer status post right colectomy as well as anemia chronic renal disease stage IV and a history of left knee and distal femoral fracture  Vital signs appear to be stable systolic is mildly elevated at 146 she is a bit  anxious.  Again she says the abdominal pain is better now feels she can eat some lunch   Past Medical History:  Diagnosis Date  . Anemia   . Arthritis   . History of blood transfusion    "today is the first time" (08/05/2014)  . Hypertension    Past Surgical History:  Procedure Laterality Date  . APPENDECTOMY    . CATARACT EXTRACTION Left   . CHOLECYSTECTOMY    . COLONOSCOPY N/A 09/26/2014   Procedure: COLONOSCOPY;  Surgeon: [name], MD;  [facility];  Service: Endoscopy;  Laterality: N/A;  1030am  . CYSTECTOMY     "had cyst taken off lower back"  . ESOPHAGOGASTRODUODENOSCOPY N/A 08/22/2014   IRW:[id number] ring at the gastro junction/small HH  . ORIF FEMUR FRACTURE Left 08/07/2014   Procedure: OPEN REDUCTION INTERNAL FIXATION (ORIF)  LEFT FEMUR FRACTURE;  Surgeon: [name], MD;  Location: [facility];  Service: Orthopedics;  Laterality: Left;  . TONSILLECTOMY      No Known Allergies  Outpatient Encounter Medications as of 03/05/2018  Medication Sig  . acetaminophen (TYLENOL) 325 MG tablet Take 650 mg by mouth every 6 (six) hours as needed.  Marland Kitchen amLODipine (NORVASC) 5 MG tablet Take 7.5 mg by mouth daily. Give 1.5 tab to =7.5 mg once a day  . denosumab (PROLIA) 60 MG/ML SOSY injection Inject 60 mg into the skin every 6 (six) months.  . ferrous sulfate 325 (65 FE) MG tablet Take 325 mg by mouth daily with breakfast.   . metoprolol (LOPRESSOR) 50 MG tablet Take  1 tablet (50 mg total) by mouth 2 (two) times daily.  . Nutritional Supplements (ENSURE ENLIVE PO) Give once day  . Polyethyl Glycol-Propyl Glycol (SYSTANE) 0.4-0.3 % GEL ophthalmic gel Place 1 application into both eyes 2 (two) times daily at 10 AM and 5 PM.   No facility-administered encounter medications on file as of 03/05/2018.     Review of Systems   This is somewhat limited secondary to dementia.  General is not complaining of any fever or chills.  Skin does not complain of diaphoresis.  Head  ears eyes nose mouth and throat not complaining of difficulty swallowing or sore throat or visual changes.  Respiratory does not complain of shortness of breath or cough.  Cardiac does not complain of chest pain.  GI has complained of somewhat diffuse abdominal pain and nausea but says it is better now.  GU-questionable complaints of dysuria  Musculoskeletal at times will complain of knee pain but is not really complaining of that today.  Neurologic does not complain of dizziness headache or numbness or syncope.  Psych does not complain of being depressed appears a little anxious this morning  Immunization History  Administered Date(s) Administered  . Influenza-Unspecified 08/01/2017  . Pneumococcal Conjugate-13 08/01/2017  . Pneumococcal Polysaccharide-23 06/13/2016  . Tdap 08/05/2014, 05/18/2017   Pertinent  Health Maintenance Due  Topic Date Due  . INFLUENZA VACCINE  04/05/2018  . DEXA SCAN  Completed  . PNA vac Low Risk Adult  Completed   Fall Risk  06/05/2017  Falls in the past year? Yes  Number falls in past yr: 1  Injury with Fall? Yes   Functional Status Survey:    Vitals:   03/05/18 1121  BP: (!) 146/64  Pulse: 64  Resp: 18  Temp: 98.2 F (36.8 C)  TempSrc: Oral  SpO2: 97%    Physical Exam   In general this is a pleasant elderly female in no distress sitting comfortably on side of her bed.  Her skin is warm and dry.  Eyes visual acuity appears to be intact.  Sclera and conjunctive are clear.  Oropharynx is clear mucous membranes moist.  Chest is clear to auscultation there is no labored breathing.  Heart is regular rate and rhythm without murmur gallop or rub she does not really have significant lower extremity edema  Abdomen is soft with active bowel sounds there is some mild diffuse tenderness to palpation although I would not really classify this as acute tenderness could not really localize could not really appreciate rebound  tenderness  GU question possible some suprapubic tenderness   rectal could not really appreciate stool in the rectal vault  Musculoskeletal-is ambulatory in wheelchair at baseline- continues to have arthritic changes of her knees moves her extremities at baseline.  Neurologic is grossly intact her speech is clear she does not speak a whole lot however.  Psych she is oriented to self is pleasant appropriate  Labs reviewed: Recent Labs    08/01/17 0740  10/27/17 0734 02/18/18 1626 02/26/18 0637  NA 136   < > 138 138 140  K 4.4   < > 4.8 5.4* 4.8  CL 104   < > 102 105 106  CO2 22   < > 22 24 23   GLUCOSE 104*   < > 103* 122* 167*  BUN 40*   < > 47* 54* 45*  CREATININE 1.96*   < > 1.92* 1.95* 1.85*  CALCIUM 9.5   < > 9.5 9.1 9.6  PHOS 3.6  --   --   --   --    < > = values in this interval not displayed.   Recent Labs    08/01/17 0740 10/27/17 0734 02/18/18 1626  AST 19 20 17   ALT 13* 13* 12*  ALKPHOS 89 86 74  BILITOT 0.3 0.3 0.4  PROT 7.2 7.6 7.1  ALBUMIN 3.9 3.8 3.8   Recent Labs    09/05/17 0430 10/27/17 0734 02/18/18 1626  WBC 5.7 6.3 6.8  NEUTROABS 3.1 3.7 3.8  HGB 9.6* 10.7* 10.1*  HCT 30.2* 34.9* 32.3*  MCV 84.8 84.9 85.0  PLT 280 321 312   Lab Results  Component Value Date   TSH 2.515 08/01/2017   No results found for: HGBA1C No results found for: CHOL, HDL, LDLCALC, LDLDIRECT, TRIG, CHOLHDL  Significant Diagnostic Results in last 30 days:  No results found.  Assessment/Plan  #1-abdominal pain- this appears to be improved according to patient but will order x-ray of the area and update lab work including CBC metabolic panel and lipase- and continue to monitor.  2- history of chronic renal insufficiency-- last creatinine was 1.85 BUN of 45 on lab done June  24 is relatively baseline--update creatinine is pending.  Addendum we have obtain the updated lab results  Lipase is minimally elevated at 54 it was 49 --two weeks ago Metabolic panel  appears stable with a sodium of 137 potassium is high normal at 5.1 BUN is 48 creatinine 1.94 which is relatively baseline.  Liver function tests are within normal limits  CBC shows a normal white count of 8.3 with hemoglobin stable at 10.6 and platelets of 325  Abdominal x-ray is pending  I did recheck patient this evening and she appears to be doing fine says the abdominal pain is gone-physical exam was relatively unchanged except she did not really have any abdominal tenderness.  Again will await x-ray results.  Also will update a metabolic panel and lipase level later this week to ensure stability in light of her borderline high normal potassium as well as minimally elevated lipase  [id number]

## 2018-03-06 ENCOUNTER — Other Ambulatory Visit (HOSPITAL_COMMUNITY)
Admission: RE | Admit: 2018-03-06 | Discharge: 2018-03-06 | Disposition: A | Discharge status: Home / Self Care | Payer: Medicare HMO | Source: Skilled Nursing Facility | Attending: Internal Medicine | Admitting: Internal Medicine

## 2018-03-06 DIAGNOSIS — N189 Chronic kidney disease, unspecified: Secondary | ICD-10-CM | POA: Diagnosis not present

## 2018-03-06 LAB — URINALYSIS, COMPLETE (UACMP) WITH MICROSCOPIC
BILIRUBIN URINE: NEGATIVE
Glucose, UA: NEGATIVE mg/dL
Hgb urine dipstick: NEGATIVE
KETONES UR: NEGATIVE mg/dL
LEUKOCYTES UA: NEGATIVE
NITRITE: NEGATIVE
Protein, ur: NEGATIVE mg/dL
RBC / HPF: NONE SEEN RBC/hpf (ref 0–5)
Specific Gravity, Urine: <1.005 — ABNORMAL LOW (ref 1.005–1.030)
pH: 7 (ref 5.0–8.0)

## 2018-03-07 LAB — URINE CULTURE: Culture: <10000 — AB

## 2018-03-09 ENCOUNTER — Encounter (HOSPITAL_COMMUNITY)
Admission: RE | Admit: 2018-03-09 | Discharge: 2018-03-09 | Disposition: A | Discharge status: Home / Self Care | Payer: Medicare HMO | Source: Skilled Nursing Facility | Attending: *Deleted | Admitting: *Deleted

## 2018-03-09 ENCOUNTER — Encounter: Payer: Self-pay | Admitting: Internal Medicine

## 2018-03-09 ENCOUNTER — Non-Acute Institutional Stay (SKILLED_NURSING_FACILITY): Payer: Medicare HMO | Admitting: Internal Medicine

## 2018-03-09 DIAGNOSIS — R112 Nausea with vomiting, unspecified: Secondary | ICD-10-CM

## 2018-03-09 DIAGNOSIS — I10 Essential (primary) hypertension: Secondary | ICD-10-CM

## 2018-03-09 DIAGNOSIS — N184 Chronic kidney disease, stage 4 (severe): Secondary | ICD-10-CM | POA: Diagnosis not present

## 2018-03-09 DIAGNOSIS — N189 Chronic kidney disease, unspecified: Secondary | ICD-10-CM | POA: Diagnosis not present

## 2018-03-09 LAB — BASIC METABOLIC PANEL
Anion gap: 11 (ref 5–15)
BUN: 42 mg/dL — ABNORMAL HIGH (ref 8–23)
CHLORIDE: 106 mmol/L (ref 98–111)
CO2: 23 mmol/L (ref 22–32)
Calcium: 9.4 mg/dL (ref 8.9–10.3)
Creatinine, Ser: 1.88 mg/dL — ABNORMAL HIGH (ref 0.44–1.00)
GFR, EST AFRICAN AMERICAN: 26 mL/min — AB (ref >60)
GFR, EST NON AFRICAN AMERICAN: 22 mL/min — AB (ref >60)
Glucose, Bld: 108 mg/dL — ABNORMAL HIGH (ref 70–99)
POTASSIUM: 4.7 mmol/L (ref 3.5–5.1)
Sodium: 140 mmol/L (ref 135–145)

## 2018-03-09 LAB — LIPASE, BLOOD: Lipase: 45 U/L (ref 11–51)

## 2018-03-09 NOTE — Progress Notes (Signed)
Location:   [facility] Room Number: 113/D Place of Service:  SNF [phone]) Provider:  Terisa Starr, MD  Patient Care Team: Sinda Du, MD as PCP - General (Pulmonary Disease) [name], MD as Consulting Physician (Gastroenterology)  Extended Emergency Contact Information Primary Emergency Contact: [name] [place] of [place] Phone: [phone] Mobile Phone: [phone] Relation: Niece Secondary Emergency Contact: [place] Phone: [phone] Relation: Neighbor  Code Status:  Full Code Goals of care: Advanced Directive information Advanced Directives 03/09/2018  Does Patient Have a Medical Advance Directive? Yes  Type of Advance Directive (No Data)  Does patient want to make changes to medical advance directive? No - Patient declined  Copy of [place] in Chart? No - copy requested  Would patient like information on creating a medical advance directive? -     Chief Complaint  Patient presents with  . Acute Visit    Patient is being seen for Nausea and Vomiting    HPI:  Pt is a 84 y.o. female seen today for an acute visit for an episode apparently of nausea and vomiting  She was seen earlier this week for- we did do labs which appeared to be stable as well as an abdominal x-ray which did not show any acute process  Lipase was minimally elevated but on lab done today actually was within normal range  Apparently after eating lunch today she complained of some nausea and did vomit appear to be undigested food according to nursing--- when I arrived in the room she did not really complain of any abdominal pain- says she was no longer feeling sick  She appeared to have a similar presentation previously.  Vital signs appear to be stable  She has a history of hypertension as well as colorectal cancer status post right colectomy as well as anemia chronic  renal disease stage IV and a history of left knee and distal femoral fracture    Past Medical History:  Diagnosis Date  . Anemia   . Arthritis   . History of blood transfusion    "today is the first time" (08/05/2014)  . Hypertension    Past Surgical History:  Procedure Laterality Date  . APPENDECTOMY    . CATARACT EXTRACTION Left   . CHOLECYSTECTOMY    . COLONOSCOPY N/A 09/26/2014   Procedure: COLONOSCOPY;  Surgeon: [name], MD;  [facility];  Service: Endoscopy;  Laterality: N/A;  1030am  . CYSTECTOMY     "had cyst taken off lower back"  . ESOPHAGOGASTRODUODENOSCOPY N/A 08/22/2014   OMV:[id number] ring at the gastro junction/small HH  . ORIF FEMUR FRACTURE Left 08/07/2014   Procedure: OPEN REDUCTION INTERNAL FIXATION (ORIF)  LEFT FEMUR FRACTURE;  Surgeon: [name], MD;  Location: [facility];  Service: Orthopedics;  Laterality: Left;  . TONSILLECTOMY      No Known Allergies  Outpatient Encounter Medications as of 03/09/2018  Medication Sig  . acetaminophen (TYLENOL) 325 MG tablet Take 650 mg by mouth every 6 (six) hours as needed.  Marland Kitchen amLODipine (NORVASC) 5 MG tablet Take 7.5 mg by mouth daily. Give 1.5 tab to =7.5 mg once a day  . denosumab (PROLIA) 60 MG/ML SOSY injection Inject 60 mg into the skin every 6 (six) months.  . ferrous sulfate 325 (65 FE) MG tablet Take 325 mg by mouth daily with breakfast.   . metoprolol (LOPRESSOR) 50 MG tablet Take  1 tablet (50 mg total) by mouth 2 (two) times daily.  . Nutritional Supplements (ENSURE ENLIVE PO) Give once day  . Polyethyl Glycol-Propyl Glycol (SYSTANE) 0.4-0.3 % GEL ophthalmic gel Place 1 application into both eyes 2 (two) times daily at 10 AM and 5 PM.   No facility-administered encounter medications on file as of 03/09/2018.     Review of Systems Is limited secondary to dementia provided by nursing as well  General is not complaining of fever chills says she feels better now  Skin does not complain of  feeling hot rashes or diaphoresis.  Head ears eyes nose mouth and throat is not complain of visual changes or difficulty swallowing or sore throat  Respiratory- not complaining of cough or shortness of breath nursing has not noted this either  Cardiac does not complain of chest pain or significant lower extremity edema  GI had an episode apparently earlier vomiting but is not complaining of abdominal pain.  GU does not complain of dysuria.  Musculoskeletal does have a history of knee pain but is not complaining of that or joint pain currently   Neurologic is not complaining of dizziness headache syncope or feeling weaker than normal  Psych does not appear to be depressed or overtly anxious --nursing has not noted this either Immunization History  Administered Date(s) Administered  . Influenza-Unspecified 08/01/2017  . Pneumococcal Conjugate-13 08/01/2017  . Pneumococcal Polysaccharide-23 06/13/2016  . Tdap 08/05/2014, 05/18/2017   Pertinent  Health Maintenance Due  Topic Date Due  . INFLUENZA VACCINE  04/05/2018  . DEXA SCAN  Completed  . PNA vac Low Risk Adult  Completed   Fall Risk  06/05/2017  Falls in the past year? Yes  Number falls in past yr: 1  Injury with Fall? Yes   Functional Status Survey:    Vitals:   03/09/18 1409  BP: (!) 141/62  Pulse: 68  Temperature is 98.6 respirations of 16  Physical Exam  In general a pleasant elderly female in no distress sitting quietly in her wheelchair  Her skin is warm and dry.  Eyes visual acuity appears to be intact sclera and conjunctive are clear.  Oropharynx is clear mucous membranes moist.  Chest is clear to auscultation there is no labored breathing.  Heart is regular rate and rhythm without murmur gallop or rub she does not really have significant lower extremity edema  Abdomen is soft nontender with positive bowel sounds do not really note any abnormalities On repeated questioning she denies  tenderness   GU-could not really appreciate suprapubic tenderness   Musculoskeletal does move her extremities at baseline with arthritic changes of her knees bilaterally  Neurologic is grossly intact her speech is clear  Psych she is oriented to self pleasant and appropriate   Labs reviewed: Recent Labs    08/01/17 0740  02/26/18 0637 03/05/18 1452 03/09/18 0840  NA 136   < > 140 137 140  K 4.4   < > 4.8 5.1 4.7  CL 104   < > 106 104 106  CO2 22   < > 23 24 23   GLUCOSE 104*   < > 167* 115* 108*  BUN 40*   < > 45* 48* 42*  CREATININE 1.96*   < > 1.85* 1.94* 1.88*  CALCIUM 9.5   < > 9.6 9.3 9.4  PHOS 3.6  --   --   --   --    < > = values in this interval not displayed.  Recent Labs    10/27/17 0734 02/18/18 1626 03/05/18 1452  AST 20 17 18   ALT 13* 12* 12  ALKPHOS 86 74 78  BILITOT 0.3 0.4 0.4  PROT 7.6 7.1 7.8  ALBUMIN 3.8 3.8 4.0   Recent Labs    10/27/17 0734 02/18/18 1626 03/05/18 1452  WBC 6.3 6.8 8.3  NEUTROABS 3.7 3.8 4.7  HGB 10.7* 10.1* 10.6*  HCT 34.9* 32.3* 33.4*  MCV 84.9 85.0 85.4  PLT 321 312 325   Lab Results  Component Value Date   TSH 2.515 08/01/2017   No results found for: HGBA1C No results found for: CHOL, HDL, LDLCALC, LDLDIRECT, TRIG, CHOLHDL  Significant Diagnostic Results in last 30 days:  Dg Abd 1 View  Result Date: 03/05/2018 CLINICAL DATA:  84 year old female with right lower quadrant abdominal pain. History of colorectal cancer. Initial encounter. EXAM: ABDOMEN - 1 VIEW COMPARISON:  10/16/2014 CT. FINDINGS: No evidence of bowel obstruction. The possibility of free intraperitoneal air cannot be assessed on a supine view. Moderate stool transverse colon. Surgical clips rectosigmoid region. Calcified uterine fibroid. Scoliosis lumbar spine with superimposed degenerative changes. Vascular calcifications. IMPRESSION: No evidence of bowel obstruction. Electronically Signed   By: Genia Del M.D.   On: 03/05/2018 20:20     Assessment/Plan   1-- episode of nausea and vomiting-now she says she feels better- x-ray earlier this week was unremarkable labs appear to be baseline as well updated lipase today showed normalization-it was minimally elevated earlier this week Will do a trial course of PRN Zofran here-twice a day as needed as needed and monitor-.  Also monitor vital signs pulse ox to shift for 24 hours to keep an eye on her  Of note we did do a urine culture earlier this week which did not show any significant growth  #2 history of chronic kidney disease this appears to be stable creatinine of 1.88 BUN of 42 on lab done today sodium is 140 potassium 4.7.    3 hypertension- per review of blood pressures it appears generally runs between 161-096 systolic range- she is on Norvasc 7.5 mg a day and Lopressor 50 mg twice daily--it appears at times probably after episodes like vomiting it does rise somewhat at this point will monitor does not appear to be consistently elevated  CPT- [phone]

## 2018-03-27 ENCOUNTER — Non-Acute Institutional Stay (SKILLED_NURSING_FACILITY): Payer: Medicare HMO | Admitting: Internal Medicine

## 2018-03-27 ENCOUNTER — Encounter: Payer: Self-pay | Admitting: Internal Medicine

## 2018-03-27 DIAGNOSIS — M810 Age-related osteoporosis without current pathological fracture: Secondary | ICD-10-CM

## 2018-03-27 DIAGNOSIS — F0390 Unspecified dementia without behavioral disturbance: Secondary | ICD-10-CM

## 2018-03-27 DIAGNOSIS — I10 Essential (primary) hypertension: Secondary | ICD-10-CM | POA: Diagnosis not present

## 2018-03-27 DIAGNOSIS — D509 Iron deficiency anemia, unspecified: Secondary | ICD-10-CM | POA: Diagnosis not present

## 2018-03-27 DIAGNOSIS — N184 Chronic kidney disease, stage 4 (severe): Secondary | ICD-10-CM | POA: Diagnosis not present

## 2018-03-27 NOTE — Progress Notes (Signed)
Location:   [facility] Room Number: 113/D Place of Service:  SNF [phone]) Provider:  Terisa Starr, MD  Patient Care Team: Sinda Du, MD as PCP - General (Pulmonary Disease) [name], MD as Consulting Physician (Gastroenterology)  Extended Emergency Contact Information Primary Emergency Contact: [name] of [place] Phone: [phone] Mobile Phone: [phone] Relation: Niece Secondary Emergency Contact: [place] of [place] Phone: [phone] Relation: Neighbor  Code Status:  Full Code Goals of care: Advanced Directive information Advanced Directives 03/27/2018  Does Patient Have a Medical Advance Directive? Yes  Type of Advance Directive (No Data)  Does patient want to make changes to medical advance directive? No - Patient declined  Copy of [place] in Chart? No - copy requested  Would patient like information on creating a medical advance directive? -     Chief Complaint  Patient presents with  . Medical Management of Chronic Issues    Patient is being seen for Routine Visit of Medical Management  Medical management of chronic medical conditions including dementia- chronic renal disease stage IV- hypertension- anemia of chronic disease- osteoporosis- recent history of abdominal pain  HPI:  Pt is a 84 y.o. female seen today for medical management of chronic diseases.  As noted above.  She appears to be doing well today-we have seen her a few times recently for intermittent abdominal pain.  Labs were unremarkable-as well as abdominal x-ray- this appears to be somewhat intermittent- occasionally she will have vomiting-.  Today she is denying any abdominal discomfort and again this appears to be somewhat of an occasional issue of short duration  Her weight continues to be stable at around 113 pounds.  She does have a history of dementia but appears  to be doing well with supportive care with no behavioral issues  She does have a history of hypertension occasionally systolics are in the [id number] elevations are not consistent -she is on Norvasc7. 5 mg a day in addition to Lopressor 50 mg twice daily  Considering her advanced age have been somewhat conservative here- would like to avoid hypotension and fall risk.  She is on Prolia with a history of osteoporosis T score on DEXA scan -2.6.  She does have a history occasionally of left knee pain she does have a history of a distal femoral fracture- she does have Tylenol which appears to give relief.  She also has a history of chronic renal disease stage IV creatinine has shown stability most recently 1.88 on lab done earlier this month  currently she is sitting in her wheelchair comfortably and has no complaints   Past Medical History:  Diagnosis Date  . Anemia   . Arthritis   . History of blood transfusion    "today is the first time" (08/05/2014)  . Hypertension    Past Surgical History:  Procedure Laterality Date  . APPENDECTOMY    . CATARACT EXTRACTION Left   . CHOLECYSTECTOMY    . COLONOSCOPY N/A 09/26/2014   Procedure: COLONOSCOPY;  Surgeon: [name], MD;  [facility];  Service: Endoscopy;  Laterality: N/A;  1030am  . CYSTECTOMY     "had cyst taken off lower back"  . ESOPHAGOGASTRODUODENOSCOPY N/A 08/22/2014   KDX:[id number] ring at the gastro junction/small HH  . ORIF FEMUR FRACTURE Left 08/07/2014   Procedure: OPEN REDUCTION INTERNAL FIXATION (ORIF)  LEFT FEMUR FRACTURE;  Surgeon: [name], MD;  Location:  [facility];  Service: Orthopedics;  Laterality: Left;  . TONSILLECTOMY      No Known Allergies  Outpatient Encounter Medications as of 03/27/2018  Medication Sig  . acetaminophen (TYLENOL) 325 MG tablet Take 650 mg by mouth every 6 (six) hours as needed.  Marland Kitchen amLODipine (NORVASC) 5 MG tablet Take 7.5 mg by mouth daily. Give 1.5 tab to =7.5 mg once a  day  . denosumab (PROLIA) 60 MG/ML SOSY injection Inject 60 mg into the skin every 6 (six) months.  . ferrous sulfate 325 (65 FE) MG tablet Take 325 mg by mouth daily with breakfast.   . metoprolol (LOPRESSOR) 50 MG tablet Take 1 tablet (50 mg total) by mouth 2 (two) times daily.  . Nutritional Supplements (ENSURE ENLIVE PO) Give once day  . ondansetron (ZOFRAN) 4 MG tablet Take 4 mg by mouth 2 (two) times daily.  Vladimir Faster Glycol-Propyl Glycol (SYSTANE) 0.4-0.3 % GEL ophthalmic gel Place 1 application into both eyes 2 (two) times daily at 10 AM and 5 PM.   No facility-administered encounter medications on file as of 03/27/2018.      Review of Systems   In general she is not complaining or fever chills weight appears to be stable.  Skin is not complaining of any rashes or itching.  Head ears eyes nose mouth and throat does not complain of difficulty swallowing sore throat or visual changes.  Respiratory-does not complain of shortness of breath or cough.  Cardiac does not complain of chest pain or significant lower extremity edema.  GI is not complaining of abdominal pain nausea vomiting diarrhea or constipation at this point at times will complain of some abdominal pain with vomiting but this is short duration intermittent  GU is not complaining of dysuria.  Muscle skeletal is not complaining of joint pain currently at times will complain of knee pain.  Neurologic is not complain of dizziness headache or numbness.  Psych does not complain of depression or anxiety nursing does not report any behaviors-she does have moderate dementia  Immunization History  Administered Date(s) Administered  . Influenza-Unspecified 08/01/2017  . Pneumococcal Conjugate-13 08/01/2017  . Pneumococcal Polysaccharide-23 06/13/2016  . Tdap 08/05/2014, 05/18/2017   Pertinent  Health Maintenance Due  Topic Date Due  . INFLUENZA VACCINE  04/05/2018  . DEXA SCAN  Completed  . PNA vac Low Risk Adult   Completed   Fall Risk  06/05/2017  Falls in the past year? Yes  Number falls in past yr: 1  Injury with Fall? Yes   Functional Status Survey:    Vitals:   03/27/18 1228  BP: (!) 145/55  Pulse: 64  Resp: 18  Temp: 97.9 F (36.6 C)  TempSrc: Oral  SpO2: 94%  Weight: 113 lb 9.6 oz (51.5 kg)  Height: 4\' 11"  (1.499 m)  Body mass index is 22.94 kg/m.Of note manual blood pressure today was 138/62 Physical Exam In general this is a pleasant elderly female in no distress sitting comfortably in her wheelchair.  Skin is warm and dry  Oropharynx is clear mucous membranes moist  Eyes sclera and conjunctive are clear visual acuity appears grossly intact.  Chest is clear to auscultation there is no labored breathing air entry is somewhat shallow.  Heart is regular rate and rhythm without murmur gallop or rub she does not have significant lower extremity edema.  Abdomen is soft nontender with active bowel sounds-  Musculoskeletal does move all extremities x4 at baseline with arthritic changes most prominently of  her knees bilaterally she largely ambulates in a wheelchair upper extremity strength appears preserved  Neurologic is grossly intact speech is clear-no lateralizing findings cranial nerves intact  Psych she is oriented to self pleasant appropriate answers simple questions appropriately  Labs reviewed: Recent Labs    08/01/17 0740  02/26/18 0637 03/05/18 1452 03/09/18 0840  NA 136   < > 140 137 140  K 4.4   < > 4.8 5.1 4.7  CL 104   < > 106 104 106  CO2 22   < > 23 24 23   GLUCOSE 104*   < > 167* 115* 108*  BUN 40*   < > 45* 48* 42*  CREATININE 1.96*   < > 1.85* 1.94* 1.88*  CALCIUM 9.5   < > 9.6 9.3 9.4  PHOS 3.6  --   --   --   --    < > = values in this interval not displayed.   Recent Labs    10/27/17 0734 02/18/18 1626 03/05/18 1452  AST 20 17 18   ALT 13* 12* 12  ALKPHOS 86 74 78  BILITOT 0.3 0.4 0.4  PROT 7.6 7.1 7.8  ALBUMIN 3.8 3.8 4.0   Recent  Labs    10/27/17 0734 02/18/18 1626 03/05/18 1452  WBC 6.3 6.8 8.3  NEUTROABS 3.7 3.8 4.7  HGB 10.7* 10.1* 10.6*  HCT 34.9* 32.3* 33.4*  MCV 84.9 85.0 85.4  PLT 321 312 325   Lab Results  Component Value Date   TSH 2.515 08/01/2017   No results found for: HGBA1C No results found for: CHOL, HDL, LDLCALC, LDLDIRECT, TRIG, CHOLHDL  Significant Diagnostic Results in last 30 days:  Dg Abd 1 View  Result Date: 03/05/2018 CLINICAL DATA:  84 year old female with right lower quadrant abdominal pain. History of colorectal cancer. Initial encounter. EXAM: ABDOMEN - 1 VIEW COMPARISON:  10/16/2014 CT. FINDINGS: No evidence of bowel obstruction. The possibility of free intraperitoneal air cannot be assessed on a supine view. Moderate stool transverse colon. Surgical clips rectosigmoid region. Calcified uterine fibroid. Scoliosis lumbar spine with superimposed degenerative changes. Vascular calcifications. IMPRESSION: No evidence of bowel obstruction. Electronically Signed   By: Genia Del M.D.   On: 03/05/2018 20:20    Assessment/Plan  #1- history of dementia- is doing well with supportive care her weight has been stable-nursing does not report any behaviors continues to be pleasant  #2 history of chronic renal disease-this appears stable with a creatinine of 1.88 will monitor periodically.  3.  History of anemia with chronic renal disease hemoglobin has shown stability she is on iron most recent hemoglobin was 10.6 on lab done earlier this month.  4-r history of colon cancer status post colectomy- again she has been stable now for an extended period of time hemoglobin has shown stability--there have been no reports of GI bleed  #5- history of hypertension-she is on Norvasc and Lopressor at this point will monitor I do not see consistent elevations above 825- systolically- again fairly conservative here secondary to her advanced age fall risk.  6 history of osteoporosis with previous history  of       Femoral   fracture-T  score was -2.6 she is on Prolia  #7- history of left knee pain-again this appears controlled with Tylenol this has been somewhat of an intermittent complaint--Per nursing Tylenol has been effective  #8-history of abdominal pain again this has been intermittent as well currently appears to be controlled she is not complaining today- she  does have PRN Zofran if needed  9 history of anemia as noted above hemoglobin has shown stability at 10.6 she is on iron  [id number]

## 2018-04-11 ENCOUNTER — Encounter: Payer: Self-pay | Admitting: Internal Medicine

## 2018-04-11 ENCOUNTER — Non-Acute Institutional Stay (SKILLED_NURSING_FACILITY): Payer: Medicare HMO | Admitting: Internal Medicine

## 2018-04-11 DIAGNOSIS — I10 Essential (primary) hypertension: Secondary | ICD-10-CM

## 2018-04-11 DIAGNOSIS — D1779 Benign lipomatous neoplasm of other sites: Secondary | ICD-10-CM

## 2018-04-11 NOTE — Progress Notes (Signed)
Location:   [facility] Room Number: 113/D Place of Service:  SNF [phone]) Provider:  Terisa Starr, MD  Patient Care Team: Sinda Du, MD as PCP - General (Pulmonary Disease) [name], MD as Consulting Physician (Gastroenterology)  Extended Emergency Contact Information Primary Emergency Contact: [name] of [place] Phone: [phone] Mobile Phone: [phone] Relation: Niece Secondary Emergency Contact: [place] of [place] Phone: [phone] Relation: Neighbor  Code Status:  Full Code  Goals of care: Advanced Directive information Advanced Directives 04/11/2018  Does Patient Have a Medical Advance Directive? Yes  Type of Advance Directive (No Data)  Does patient want to make changes to medical advance directive? No - Patient declined  Copy of [place] in Chart? No - copy requested  Would patient like information on creating a medical advance directive? -     Chief Complaint  Patient presents with  . Acute Visit    Patient is being seen for a lump on her back    HPI:  Pt is a 84 y.o. female seen today for an acute visit for a chronic lump on her low back which nursing staff feels may be increasing in size.  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 Anemia   . Arthritis   . History of blood transfusion    "today is the first time" (08/05/2014)  . Hypertension    Past Surgical History:  Procedure Laterality Date  . APPENDECTOMY    . CATARACT EXTRACTION Left   . CHOLECYSTECTOMY    . COLONOSCOPY N/A 09/26/2014   Procedure: COLONOSCOPY;  Surgeon: [name], MD;  [facility];  Service: Endoscopy;  Laterality: N/A;  1030am  . CYSTECTOMY     "had cyst taken off lower back"  . ESOPHAGOGASTRODUODENOSCOPY N/A 08/22/2014   DJS:[id number] ring at the gastro junction/small HH  . ORIF FEMUR FRACTURE Left 08/07/2014   Procedure: OPEN REDUCTION INTERNAL FIXATION (ORIF)  LEFT FEMUR FRACTURE;  Surgeon: [name], MD;  Location: [facility];  Service: Orthopedics;  Laterality: Left;  . TONSILLECTOMY      No Known Allergies  Outpatient Encounter Medications as of 04/11/2018  Medication Sig  . acetaminophen (TYLENOL) 325 MG tablet Take 650 mg by mouth every 6 (six) hours as needed.  Marland Kitchen amLODipine (NORVASC) 5 MG tablet Take 7.5 mg by mouth daily. Give 1.5 tab to =7.5 mg once a day  . ferrous sulfate 325 (65 FE) MG tablet Take 325 mg by mouth daily with breakfast.   . metoprolol (LOPRESSOR) 50 MG tablet Take 1 tablet (50 mg total) by mouth 2 (  two) times daily.  . Nutritional Supplements (ENSURE ENLIVE PO) Give once day  . ondansetron (ZOFRAN) 4 MG tablet Take 4 mg by mouth 2 (two) times daily.  Vladimir Faster Glycol-Propyl Glycol (SYSTANE) 0.4-0.3 % GEL ophthalmic gel Place 1 application into both eyes 2 (two) times daily at 10 AM and 5 PM.  . [DISCONTINUED] denosumab (PROLIA) 60 MG/ML SOSY injection Inject 60 mg into the skin every 6 (six) months.   No facility-administered encounter medications on file as of 04/11/2018.     Review of  Systems   This is somewhat limited secondary to dementia.  In general she is not complain of fever chills or  Skin is not complain of rashes or itching again  Lump on  her lower back may be increasing in size per nursing  Head ears eyes nose mouth and throat is not complain of visual changes.  Respiratory is not complaining of shortness of breath or cough.  Cardiac does not complain of chest pain.  GI does not complain of abdominal pain today vomiting  Muscular skeletal-again when her lump is palpated says it is somewhat tender Does not complain of joint pain  Neurologic does not complain of dizziness headache numbness or syncope  Psych does have a history of dementia does not complain of being anxious or depressed appears to be usual pleasant mood does not--she does not speak much   Immunization History  Administered Date(s) Administered  . Influenza-Unspecified 08/01/2017  . Pneumococcal Conjugate-13 08/01/2017  . Pneumococcal Polysaccharide-23 06/13/2016  . Tdap 08/05/2014, 05/18/2017   Pertinent  Health Maintenance Due  Topic Date Due  . INFLUENZA VACCINE  05/12/2018 (Originally 04/05/2018)  . DEXA SCAN  Completed  . PNA vac Low Risk Adult  Completed   Fall Risk  06/05/2017  Falls in the past year? Yes  Number falls in past yr: 1  Injury with Fall? Yes   Functional Status Survey:    Vitals:   04/11/18 1428  BP: (!) 160/56  Pulse: 63  Resp: 18  Temp: 98.7 F (37.1 C)  TempSrc: Oral  Manual reading was 142/62 Physical Exam  In general this is a pleasant elderly female in no distress sitting comfortably in her wheelchair  Her skin is warm and dry I do note lower back area there is a raised lump-like area it does appear to be somewhat more firm andpossibly larger it is not really warm or acutely tender-there is no surrounding erythema there is no drainage She says it is somewhat tender when palpated   Eyes sclera and conjunctive are clear visual acuity  appears to be intact  Oropharynx is clear mucous membranes moist  Chest is clear to auscultation there is no labored breathing somewhat shallow air entry  Heart is regular rate and rhythm without murmur gallop or rub-  Abdomen is soft nontender with positive bowel sounds  Musculoskeletal does move all extremities x4 at baseline somewhat limited strength of her lower extremities with arthritic changes of her knees  Neurologic is grossly intact her speech is clear.  Psych she is oriented to self pleasant appropriate does respond appropriately to simple direct questions--follows commands without much difficulty   Labs reviewed: Recent Labs    08/01/17 0740  02/26/18 0637 03/05/18 1452 03/09/18 0840  NA 136   < > 140 137 140  K 4.4   < > 4.8 5.1 4.7  CL 104   < > 106 104 106  CO2 22   < > 23 24  23  GLUCOSE 104*   < > 167* 115* 108*  BUN 40*   < > 45* 48* 42*  CREATININE 1.96*   < > 1.85* 1.94* 1.88*  CALCIUM 9.5   < > 9.6 9.3 9.4  PHOS 3.6  --   --   --   --    < > = values in this interval not displayed.   Recent Labs    10/27/17 0734 02/18/18 1626 03/05/18 1452  AST 20 17 18   ALT 13* 12* 12  ALKPHOS 86 74 78  BILITOT 0.3 0.4 0.4  PROT 7.6 7.1 7.8  ALBUMIN 3.8 3.8 4.0   Recent Labs    10/27/17 0734 02/18/18 1626 03/05/18 1452  WBC 6.3 6.8 8.3  NEUTROABS 3.7 3.8 4.7  HGB 10.7* 10.1* 10.6*  HCT 34.9* 32.3* 33.4*  MCV 84.9 85.0 85.4  PLT 321 312 325   Lab Results  Component Value Date   TSH 2.515 08/01/2017   No results found for: HGBA1C No results found for: CHOL, HDL, LDLCALC, LDLDIRECT, TRIG, CHOLHDL  Significant Diagnostic Results in last 30 days:  No results found.  Assessment/Plan  #1-history of suspected lipoma lower back-possibly increasing in firmness in size-will start by ordering an x-ray we may need to do an ultrasound but will start initially with an x-ray.  Does not really appear to show signs of warmth tenderness erythema infection but  will order CBC with differential for any elevated white count.  Continue to monitor.  2.  Hypertension she continues on Norvasc 7.5 mg a day as well as metoprolol 50 mg twice a day-as noted above systolics appear to run averaging 1 30-1 40s with occasional readings a bit lower and some a bit higher-at this point will continue to monitor  [id number]--of note greater than 25 minutes spent assessing patient-discussing her status with nursing staff-reviewing her chart and labs- and coordinating and formulating a plan of care-of note greater than 50% of time spent coordinating a plan of care with input as noted above

## 2018-04-12 ENCOUNTER — Encounter: Payer: Self-pay | Admitting: Internal Medicine

## 2018-04-12 ENCOUNTER — Other Ambulatory Visit (HOSPITAL_COMMUNITY)
Admission: RE | Admit: 2018-04-12 | Discharge: 2018-04-12 | Disposition: A | Discharge status: Home / Self Care | Payer: Medicare HMO | Source: Skilled Nursing Facility | Attending: Internal Medicine | Admitting: Internal Medicine

## 2018-04-12 DIAGNOSIS — I10 Essential (primary) hypertension: Secondary | ICD-10-CM | POA: Diagnosis not present

## 2018-04-12 DIAGNOSIS — M546 Pain in thoracic spine: Secondary | ICD-10-CM | POA: Diagnosis not present

## 2018-04-12 DIAGNOSIS — M545 Low back pain: Secondary | ICD-10-CM | POA: Diagnosis not present

## 2018-04-12 LAB — CBC WITH DIFFERENTIAL/PLATELET
BASOS ABS: 0 10*3/uL (ref 0.0–0.1)
Basophils Relative: 1 %
Eosinophils Absolute: 0.2 10*3/uL (ref 0.0–0.7)
Eosinophils Relative: 3 %
HCT: 33.4 % — ABNORMAL LOW (ref 36.0–46.0)
Hemoglobin: 10.5 g/dL — ABNORMAL LOW (ref 12.0–15.0)
LYMPHS ABS: 1.8 10*3/uL (ref 0.7–4.0)
LYMPHS PCT: 29 %
MCH: 26.6 pg (ref 26.0–34.0)
MCHC: 31.4 g/dL (ref 30.0–36.0)
MCV: 84.8 fL (ref 78.0–100.0)
Monocytes Absolute: 0.5 10*3/uL (ref 0.1–1.0)
Monocytes Relative: 8 %
NEUTROS PCT: 59 %
Neutro Abs: 3.8 10*3/uL (ref 1.7–7.7)
Platelets: 300 10*3/uL (ref 150–400)
RBC: 3.94 MIL/uL (ref 3.87–5.11)
RDW: 13.5 % (ref 11.5–15.5)
WBC: 6.4 10*3/uL (ref 4.0–10.5)

## 2018-04-12 LAB — BASIC METABOLIC PANEL
Anion gap: 8 (ref 5–15)
BUN: 43 mg/dL — ABNORMAL HIGH (ref 8–23)
CHLORIDE: 106 mmol/L (ref 98–111)
CO2: 23 mmol/L (ref 22–32)
Calcium: 9.4 mg/dL (ref 8.9–10.3)
Creatinine, Ser: 1.89 mg/dL — ABNORMAL HIGH (ref 0.44–1.00)
GFR calc Af Amer: 25 mL/min — ABNORMAL LOW (ref >60)
GFR, EST NON AFRICAN AMERICAN: 22 mL/min — AB (ref >60)
Glucose, Bld: 111 mg/dL — ABNORMAL HIGH (ref 70–99)
POTASSIUM: 4.5 mmol/L (ref 3.5–5.1)
SODIUM: 137 mmol/L (ref 135–145)

## 2018-04-12 NOTE — Progress Notes (Signed)
Location:    [facility] Room Number: 113/D Place of Service:  SNF [phone]) Provider:  Terisa Starr, MD  Patient Care Team: Sinda Du, MD as PCP - General (Pulmonary Disease) [name], MD as Consulting Physician (Gastroenterology)  Extended Emergency Contact Information Primary Emergency Contact: [name] of [place] Phone: [phone] Mobile Phone: [phone] Relation: Niece Secondary Emergency Contact: [place] of [place] Phone: [phone] Relation: Neighbor  Code Status: Full Code Goals of care: Advanced Directive information Advanced Directives 04/12/2018  Does Patient Have a Medical Advance Directive? Yes  Type of Advance Directive (No Data)  Does patient want to make changes to medical advance directive? No - Patient declined  Copy of [place] in Chart? No - copy requested  Would patient like information on creating a medical advance directive? -     Chief Complaint  Patient presents with  . Acute Visit    Patient is being seen of f/u x-ray     HPI:  Pt is a 84 y.o. female seen today for an acute visit for    Past Medical History:  Diagnosis Date  . Anemia   . Arthritis   . History of blood transfusion    "today is the first time" (08/05/2014)  . Hypertension    Past Surgical History:  Procedure Laterality Date  . APPENDECTOMY    . CATARACT EXTRACTION Left   . CHOLECYSTECTOMY    . COLONOSCOPY N/A 09/26/2014   Procedure: COLONOSCOPY;  Surgeon: [name], MD;  [facility];  Service: Endoscopy;  Laterality: N/A;  1030am  . CYSTECTOMY     "had cyst taken off lower back"  . ESOPHAGOGASTRODUODENOSCOPY N/A 08/22/2014   IYM:EBRAXENM ring at the gastro junction/small HH  . ORIF FEMUR FRACTURE Left 08/07/2014   Procedure: OPEN REDUCTION INTERNAL FIXATION (ORIF)  LEFT FEMUR FRACTURE;  Surgeon: [name], MD;  Location:  [facility];  Service: Orthopedics;  Laterality: Left;  . TONSILLECTOMY      No Known Allergies  Outpatient Encounter Medications as of 04/12/2018  Medication Sig  . acetaminophen (TYLENOL) 325 MG tablet Take 650 mg by mouth every 6 (six) hours as needed.  Marland Kitchen amLODipine (NORVASC) 5 MG tablet Take 7.5 mg by mouth daily. Give 1.5 tab to =7.5 mg once a day  . ferrous sulfate 325 (65 FE) MG tablet Take 325 mg by mouth daily with breakfast.   . metoprolol (LOPRESSOR) 50 MG tablet Take 1 tablet (50 mg total) by mouth 2 (two) times daily.  . Nutritional Supplements (ENSURE ENLIVE PO) Give once day  . ondansetron (ZOFRAN) 4 MG tablet Take 4 mg by mouth 2 (two) times daily.  Vladimir Faster Glycol-Propyl Glycol (SYSTANE) 0.4-0.3 % GEL ophthalmic gel Place 1 application into both eyes 2 (two) times daily at 10 AM and 5 PM.   No facility-administered encounter medications on file as of 04/12/2018.     Review of Systems  Immunization History  Administered Date(s) Administered  . Influenza-Unspecified 08/01/2017  . Pneumococcal Conjugate-13 08/01/2017  . Pneumococcal Polysaccharide-23 06/13/2016  . Tdap 08/05/2014, 05/18/2017   Pertinent  Health Maintenance Due  Topic Date Due  . INFLUENZA VACCINE  05/12/2018 (Originally 04/05/2018)  . DEXA SCAN  Completed  . PNA vac Low Risk Adult  Completed   Fall Risk  06/05/2017  Falls in the past year? Yes  Number falls in past yr: 1  Injury with  Fall? Yes   Functional Status Survey:    There were no vitals filed for this visit. There is no height or weight on file to calculate BMI. Physical Exam  Labs reviewed: Recent Labs    08/01/17 0740  03/05/18 1452 03/09/18 0840 04/12/18 0700  NA 136   < > 137 140 137  K 4.4   < > 5.1 4.7 4.5  CL 104   < > 104 106 106  CO2 22   < > 24 23 23   GLUCOSE 104*   < > 115* 108* 111*  BUN 40*   < > 48* 42* 43*  CREATININE 1.96*   < > 1.94* 1.88* 1.89*  CALCIUM 9.5   < > 9.3 9.4 9.4  PHOS 3.6  --   --   --   --    <  > = values in this interval not displayed.   Recent Labs    10/27/17 0734 02/18/18 1626 03/05/18 1452  AST 20 17 18   ALT 13* 12* 12  ALKPHOS 86 74 78  BILITOT 0.3 0.4 0.4  PROT 7.6 7.1 7.8  ALBUMIN 3.8 3.8 4.0   Recent Labs    02/18/18 1626 03/05/18 1452 04/12/18 0700  WBC 6.8 8.3 6.4  NEUTROABS 3.8 4.7 3.8  HGB 10.1* 10.6* 10.5*  HCT 32.3* 33.4* 33.4*  MCV 85.0 85.4 84.8  PLT 312 325 300   Lab Results  Component Value Date   TSH 2.515 08/01/2017   No results found for: HGBA1C No results found for: CHOL, HDL, LDLCALC, LDLDIRECT, TRIG, CHOLHDL  Significant Diagnostic Results in last 30 days:  No results found.  Assessment/Plan There are no diagnoses linked to this encounter.      Oralia Manis, [place]

## 2018-04-15 NOTE — Progress Notes (Signed)
This encounter was created in error - please disregard.

## 2018-05-25 ENCOUNTER — Non-Acute Institutional Stay (SKILLED_NURSING_FACILITY): Payer: Medicare HMO | Admitting: Internal Medicine

## 2018-05-25 ENCOUNTER — Encounter: Payer: Self-pay | Admitting: Internal Medicine

## 2018-05-25 DIAGNOSIS — R410 Disorientation, unspecified: Secondary | ICD-10-CM

## 2018-05-25 DIAGNOSIS — N184 Chronic kidney disease, stage 4 (severe): Secondary | ICD-10-CM

## 2018-05-25 DIAGNOSIS — I10 Essential (primary) hypertension: Secondary | ICD-10-CM

## 2018-05-25 NOTE — Progress Notes (Signed)
Location:    [facility] Room Number: 113/D Place of Service:  SNF [phone]) Provider:  Terisa Starr, MD  Patient Care Team: Sinda Du, MD as PCP - General (Pulmonary Disease) [name], MD as Consulting Physician (Gastroenterology)  Extended Emergency Contact Information Primary Emergency Contact: [name] of [place] Phone: [phone] Mobile Phone: [phone] Relation: Niece Secondary Emergency Contact: [place] of [place] Phone: [phone] Relation: Neighbor  Code Status:  Full Code Goals of care: Advanced Directive information Advanced Directives 05/25/2018  Does Patient Have a Medical Advance Directive? Yes  Type of Advance Directive (No Data)  Does patient want to make changes to medical advance directive? No - Patient declined  Copy of [place] in Chart? No - copy requested  Would patient like information on creating a medical advance directive? -     Chief Complaint  Patient presents with  . Acute Visit    Patient is being seen for confusion    HPI:  Pt is a 84 y.o. female seen today for an acute visit for some increased confusion.  She does have a history of baseline dementia and appears to be stable with no really significant behaviors.  At times however she will have some increased confusion today she is wondering about saying she needs to go home.  Vital signs are stable when I asked her she does not have any complaints of pain or discomfort nursing staff does not report this either.  She does deny any shortness of breath cough or dysuria.  She is afebrile.  She was also seen recently for a chronic suspected lipoma on her lower back-we did order an x-ray which shows some degenerative changes of the lumbar spine and spondylosis at L4-L5.  --It also showed some increased densities and sclerosis involving the L1 vertebral body and  said metastatic lesion could not be excluded suggest a possible bone scan or MRI.  We have tried contacting patient's responsible party about this but have had difficulty contacting her- also have talked with social worker about attempting to contact her.  Talking with nursing this week apparently her RP was here recently and x-ray was mentioned and apparently she did not express desires for aggressive intervention but would like to confirm this again will await her input at this point will monitor.  Patient not complaining of any increased back pain -- lipoma on her lower back appears relatively stable nursing thought it had increased in size at one point.  Again will await her responsible party's input -- I suspect she would be a poor candidate for any aggressive treatment     Past Medical History:  Diagnosis Date  . Anemia   . Arthritis   . History of blood transfusion    "today is the first time" (08/05/2014)  . Hypertension    Past Surgical History:  Procedure Laterality Date  . APPENDECTOMY    . CATARACT EXTRACTION Left   . CHOLECYSTECTOMY    . COLONOSCOPY N/A 09/26/2014   Procedure: COLONOSCOPY;  Surgeon: [name], MD;  [facility];  Service: Endoscopy;  Laterality: N/A;  1030am  . CYSTECTOMY     "had cyst taken off lower back"  . ESOPHAGOGASTRODUODENOSCOPY N/A 08/22/2014   [id number] ring at the gastro junction/small HH  . ORIF FEMUR FRACTURE Left 08/07/2014   Procedure: OPEN REDUCTION INTERNAL FIXATION (ORIF)  LEFT FEMUR FRACTURE;  Surgeon: [name],  MD;  Location: [facility];  Service: Orthopedics;  Laterality: Left;  . TONSILLECTOMY      No Known Allergies  Outpatient Encounter Medications as of 05/25/2018  Medication Sig  . acetaminophen (TYLENOL) 325 MG tablet Take 650 mg by mouth every 6 (six) hours as needed.  Marland Kitchen amLODipine (NORVASC) 5 MG tablet Take 7.5 mg by mouth daily. Give 1.5 tab to =7.5 mg once a day  . ferrous sulfate 325 (65 FE) MG  tablet Take 325 mg by mouth daily with breakfast.   . metoprolol (LOPRESSOR) 50 MG tablet Take 1 tablet (50 mg total) by mouth 2 (two) times daily.  . Nutritional Supplements (ENSURE ENLIVE PO) Give once day  . ondansetron (ZOFRAN) 4 MG tablet Take 4 mg by mouth 2 (two) times daily.  Vladimir Faster Glycol-Propyl Glycol (SYSTANE) 0.4-0.3 % GEL ophthalmic gel Place 1 application into both eyes 2 (two) times daily at 10 AM and 5 PM.   No facility-administered encounter medications on file as of 05/25/2018.     Review of Systems   This is very limited secondary to dementia but she is not complaining of any pain at this time denies abdominal pain or shortness of breath or chest pain.  Denies dysuria and denies joint pain at this time although in the past she has complained at times of some knee pain  Immunization History  Administered Date(s) Administered  . Influenza-Unspecified 08/01/2017  . Pneumococcal Conjugate-13 08/01/2017  . Pneumococcal Polysaccharide-23 06/13/2016  . Tdap 08/05/2014, 05/18/2017   Pertinent  Health Maintenance Due  Topic Date Due  . INFLUENZA VACCINE  06/24/2018 (Originally 04/05/2018)  . DEXA SCAN  Completed  . PNA vac Low Risk Adult  Completed   Fall Risk  06/05/2017  Falls in the past year? Yes  Number falls in past yr: 1  Injury with Fall? Yes   Functional Status Survey:    Vitals:   05/25/18 1418  BP: 139/64  Pulse: 82  Resp: 17  Temp: (!) 97.1 F (36.2 C)  TempSrc: Oral  SpO2: 95%  Weight is relatively stable at 110.6 pounds Manual blood pressure today was 120/70 Physical Exam   In general this is a pleasant elderly female in no distress sitting comfortably in a wheelchair mentally she appears to be somewhat confused but at her baseline pleasant and cooperative.  Her skin is warm and dry on her lower back continues to be a raised lump-like area it does not appear to be significantly tender it continues to be somewhat firm-it is not erythematous  or warm.  Eyes sclera and conjunctive appear clear visual acuity appears to be intact.  Oropharynx is clear mucous membranes moist.  Chest is clear to auscultation there is no labored breathing her air entry continues to be somewhat shallow.  Heart is regular rate and rhythm without murmur gallop or rub she does not really have significant lower extremity edema.  Her abdomen is soft nontender with positive bowel sounds.  GU could not really appreciate suprapubic tenderness.  Musculoskeletal continues at baseline moves all extremities x4 and ambulates quite well in her wheelchair about facility she does have arthritic changes of her knees but this is not new.  Neurologic is grossly intact her speech is clear but she does not speak a whole lot cranial nerves appear to be intact I could not really appreciate lateralizing findings still ambulating well in her wheelchair.  Psych she is oriented to self she is pleasant appropriate answers simple direct questions  appropriately--does follow verbal commands   Labs reviewed: Recent Labs    08/01/17 0740  03/05/18 1452 03/09/18 0840 04/12/18 0700  NA 136   < > 137 140 137  K 4.4   < > 5.1 4.7 4.5  CL 104   < > 104 106 106  CO2 22   < > 24 23 23   GLUCOSE 104*   < > 115* 108* 111*  BUN 40*   < > 48* 42* 43*  CREATININE 1.96*   < > 1.94* 1.88* 1.89*  CALCIUM 9.5   < > 9.3 9.4 9.4  PHOS 3.6  --   --   --   --    < > = values in this interval not displayed.   Recent Labs    10/27/17 0734 02/18/18 1626 03/05/18 1452  AST 20 17 18   ALT 13* 12* 12  ALKPHOS 86 74 78  BILITOT 0.3 0.4 0.4  PROT 7.6 7.1 7.8  ALBUMIN 3.8 3.8 4.0   Recent Labs    02/18/18 1626 03/05/18 1452 04/12/18 0700  WBC 6.8 8.3 6.4  NEUTROABS 3.8 4.7 3.8  HGB 10.1* 10.6* 10.5*  HCT 32.3* 33.4* 33.4*  MCV 85.0 85.4 84.8  PLT 312 325 300   Lab Results  Component Value Date   TSH 2.515 08/01/2017   No results found for: HGBA1C No results found for: CHOL,  HDL, LDLCALC, LDLDIRECT, TRIG, CHOLHDL  Significant Diagnostic Results in last 30 days:  No results found.  Assessment/Plan   #1- #1 confusion-we will start by ordering updated lab work including CBC metabolic panel and ammonia level.  She is denying any dysuria but if labs come back fairly unremarkable and confusion persists will most most likely obtain a urinalysis and culture.  Monitor vital signs pulse ox every shift for 48 hours.  2.  History of suspected lipoma  at one point thought it had increased in size but this appears to have stabilized she is not really having any tenderness or discomfort with this.  3.  X-ray findings as noted above showing increased density and sclerosis L1 vertebral body- suggestion for possible follow-up bone scan or MRI-will wait input from responsible party as noted above-again I suspect she would be a poor  candidate for much aggressive therapy with her comorbidities and advanced age   #4 history hypertension she is on Norvasc  As well as Lopressor -this appears fairly stable I got a manual reading of 120/70 today I do not see consistent elevations occasionally elevations in the 150s area but these do not appear to be consistent  #5 history of renal insufficiency creatinine 1.89 BUN of 43 on lab done early last month appears relatively baseline again this will be updated   [id number]-of note greater than 25 minutes spent assessing patient-discussing her status with nursing staff- reviewing her chart and labs-coordinating and formulating a plan of care for numerous diagnoses- of note greater than 50% of time spent coordinating plan of care

## 2018-05-26 ENCOUNTER — Encounter (HOSPITAL_COMMUNITY)
Admission: RE | Admit: 2018-05-26 | Discharge: 2018-05-26 | Disposition: A | Discharge status: Home / Self Care | Payer: Medicare HMO | Source: Skilled Nursing Facility | Attending: Internal Medicine | Admitting: Internal Medicine

## 2018-05-26 DIAGNOSIS — N189 Chronic kidney disease, unspecified: Secondary | ICD-10-CM | POA: Diagnosis not present

## 2018-05-26 LAB — CBC WITH DIFFERENTIAL/PLATELET
Basophils Absolute: 0 10*3/uL (ref 0.0–0.1)
Basophils Relative: 0 %
Eosinophils Absolute: 0.1 10*3/uL (ref 0.0–0.7)
Eosinophils Relative: 1 %
HEMATOCRIT: 31.1 % — AB (ref 36.0–46.0)
HEMOGLOBIN: 9.8 g/dL — AB (ref 12.0–15.0)
LYMPHS ABS: 2.2 10*3/uL (ref 0.7–4.0)
LYMPHS PCT: 29 %
MCH: 26.6 pg (ref 26.0–34.0)
MCHC: 31.5 g/dL (ref 30.0–36.0)
MCV: 84.3 fL (ref 78.0–100.0)
Monocytes Absolute: 0.9 10*3/uL (ref 0.1–1.0)
Monocytes Relative: 12 %
Neutro Abs: 4.4 10*3/uL (ref 1.7–7.7)
Neutrophils Relative %: 58 %
Platelets: 293 10*3/uL (ref 150–400)
RBC: 3.69 MIL/uL — ABNORMAL LOW (ref 3.87–5.11)
RDW: 13.5 % (ref 11.5–15.5)
WBC: 7.6 10*3/uL (ref 4.0–10.5)

## 2018-05-26 LAB — COMPREHENSIVE METABOLIC PANEL
ALK PHOS: 74 U/L (ref 38–126)
ALT: 13 U/L (ref 0–44)
ANION GAP: 8 (ref 5–15)
AST: 18 U/L (ref 15–41)
Albumin: 3.7 g/dL (ref 3.5–5.0)
BUN: 37 mg/dL — ABNORMAL HIGH (ref 8–23)
CALCIUM: 9 mg/dL (ref 8.9–10.3)
CO2: 23 mmol/L (ref 22–32)
Chloride: 108 mmol/L (ref 98–111)
Creatinine, Ser: 1.77 mg/dL — ABNORMAL HIGH (ref 0.44–1.00)
GFR calc non Af Amer: 24 mL/min — ABNORMAL LOW (ref >60)
GFR, EST AFRICAN AMERICAN: 28 mL/min — AB (ref >60)
Glucose, Bld: 91 mg/dL (ref 70–99)
Potassium: 4.5 mmol/L (ref 3.5–5.1)
Sodium: 139 mmol/L (ref 135–145)
TOTAL PROTEIN: 6.7 g/dL (ref 6.5–8.1)
Total Bilirubin: 0.6 mg/dL (ref 0.3–1.2)

## 2018-05-26 LAB — AMMONIA: Ammonia: 22 umol/L (ref 9–35)

## 2018-05-28 DIAGNOSIS — I739 Peripheral vascular disease, unspecified: Secondary | ICD-10-CM | POA: Diagnosis not present

## 2018-05-28 DIAGNOSIS — B351 Tinea unguium: Secondary | ICD-10-CM | POA: Diagnosis not present

## 2018-05-28 DIAGNOSIS — L84 Corns and callosities: Secondary | ICD-10-CM | POA: Diagnosis not present

## 2018-06-10 ENCOUNTER — Other Ambulatory Visit: Payer: Self-pay

## 2018-06-10 ENCOUNTER — Emergency Department (HOSPITAL_COMMUNITY)
Admission: EM | Admit: 2018-06-10 | Discharge: 2018-06-11 | Disposition: A | Discharge status: Skilled Nursing Facility | Payer: Medicare HMO | Attending: Emergency Medicine | Admitting: Emergency Medicine

## 2018-06-10 ENCOUNTER — Encounter (HOSPITAL_COMMUNITY): Payer: Self-pay

## 2018-06-10 DIAGNOSIS — N184 Chronic kidney disease, stage 4 (severe): Secondary | ICD-10-CM | POA: Insufficient documentation

## 2018-06-10 DIAGNOSIS — Z79899 Other long term (current) drug therapy: Secondary | ICD-10-CM | POA: Insufficient documentation

## 2018-06-10 DIAGNOSIS — Z87891 Personal history of nicotine dependence: Secondary | ICD-10-CM | POA: Insufficient documentation

## 2018-06-10 DIAGNOSIS — I129 Hypertensive chronic kidney disease with stage 1 through stage 4 chronic kidney disease, or unspecified chronic kidney disease: Secondary | ICD-10-CM | POA: Insufficient documentation

## 2018-06-10 DIAGNOSIS — R112 Nausea with vomiting, unspecified: Secondary | ICD-10-CM | POA: Diagnosis not present

## 2018-06-10 DIAGNOSIS — R1110 Vomiting, unspecified: Secondary | ICD-10-CM | POA: Diagnosis present

## 2018-06-10 LAB — CBC WITH DIFFERENTIAL/PLATELET
BASOS PCT: 1 %
Basophils Absolute: 0 10*3/uL (ref 0.0–0.1)
Eosinophils Absolute: 0.2 10*3/uL (ref 0.0–0.7)
Eosinophils Relative: 3 %
HEMATOCRIT: 35.7 % — AB (ref 36.0–46.0)
Hemoglobin: 11.4 g/dL — ABNORMAL LOW (ref 12.0–15.0)
Lymphocytes Relative: 28 %
Lymphs Abs: 1.6 10*3/uL (ref 0.7–4.0)
MCH: 27 pg (ref 26.0–34.0)
MCHC: 31.9 g/dL (ref 30.0–36.0)
MCV: 84.4 fL (ref 78.0–100.0)
MONO ABS: 0.6 10*3/uL (ref 0.1–1.0)
MONOS PCT: 10 %
NEUTROS ABS: 3.6 10*3/uL (ref 1.7–7.7)
Neutrophils Relative %: 60 %
Platelets: 300 10*3/uL (ref 150–400)
RBC: 4.23 MIL/uL (ref 3.87–5.11)
RDW: 13.6 % (ref 11.5–15.5)
WBC: 6 10*3/uL (ref 4.0–10.5)

## 2018-06-10 NOTE — ED Triage Notes (Signed)
Pt arrived in wheel chair from [facility]. Pt reports emesis X 1 that occurred before bedtime tonight. Staff from [facility] report emesis was brown and are requesting we do blood work and check for blood in emesis. Pt denies diarhea at this time.

## 2018-06-11 ENCOUNTER — Encounter: Payer: Self-pay | Admitting: Internal Medicine

## 2018-06-11 ENCOUNTER — Non-Acute Institutional Stay (SKILLED_NURSING_FACILITY): Payer: Medicare HMO | Admitting: Internal Medicine

## 2018-06-11 ENCOUNTER — Inpatient Hospital Stay
Admission: RE | Admit: 2018-06-11 | Discharge: 2019-04-28 | Disposition: A | Discharge status: Nursing Home (Includes ICF) | Payer: Medicare HMO | Source: Ambulatory Visit | Attending: Internal Medicine | Admitting: Internal Medicine

## 2018-06-11 DIAGNOSIS — F0390 Unspecified dementia without behavioral disturbance: Secondary | ICD-10-CM | POA: Diagnosis not present

## 2018-06-11 DIAGNOSIS — R112 Nausea with vomiting, unspecified: Secondary | ICD-10-CM

## 2018-06-11 LAB — COMPREHENSIVE METABOLIC PANEL
ALK PHOS: 81 U/L (ref 38–126)
ALT: 11 U/L (ref 0–44)
ANION GAP: 7 (ref 5–15)
AST: 15 U/L (ref 15–41)
Albumin: 3.8 g/dL (ref 3.5–5.0)
BILIRUBIN TOTAL: 0.2 mg/dL — AB (ref 0.3–1.2)
BUN: 39 mg/dL — AB (ref 8–23)
CALCIUM: 8.9 mg/dL (ref 8.9–10.3)
CO2: 25 mmol/L (ref 22–32)
Chloride: 104 mmol/L (ref 98–111)
Creatinine, Ser: 1.75 mg/dL — ABNORMAL HIGH (ref 0.44–1.00)
GFR calc Af Amer: 28 mL/min — ABNORMAL LOW (ref >60)
GFR calc non Af Amer: 24 mL/min — ABNORMAL LOW (ref >60)
Glucose, Bld: 143 mg/dL — ABNORMAL HIGH (ref 70–99)
POTASSIUM: 4.5 mmol/L (ref 3.5–5.1)
Sodium: 136 mmol/L (ref 135–145)
Total Protein: 7.2 g/dL (ref 6.5–8.1)

## 2018-06-11 LAB — SAMPLE TO BLOOD BANK

## 2018-06-11 LAB — POC OCCULT BLOOD, ED: FECAL OCCULT BLD: NEGATIVE

## 2018-06-11 LAB — LIPASE, BLOOD: Lipase: 60 U/L — ABNORMAL HIGH (ref 11–51)

## 2018-06-11 NOTE — Progress Notes (Signed)
Location:  [facility] of Service:  SNF (31) Provider:    Sinda Du, MD  Patient Care Team: Sinda Du, MD as PCP - General (Pulmonary Disease) [name], MD as Consulting Physician (Gastroenterology)  Extended Emergency Contact Information Primary Emergency Contact: [name] of [place] Phone: [phone] Mobile Phone: [phone] Relation: Niece Secondary Emergency Contact: [place] Phone: [phone] Relation: Neighbor  Code Status:  Full Code Goals of care: Advanced Directive information Advanced Directives 05/25/2018  Does Patient Have a Medical Advance Directive? Yes  Type of Advance Directive (No Data)  Does patient want to make changes to medical advance directive? No - Patient declined  Copy of [place] in Chart? No - copy requested  Would patient like information on creating a medical advance directive? -     Chief Complaint  Patient presents with  . Acute Visit    Follow up From ED    HPI:  Pt is a 84 y.o. female seen today for an acute visit for  ED visit Patient has h/o Hypertension, Colorectal cancer s/p Right Colectomy, Anemia, Chronic renal disease.Stage 4, H/o Left Knee and Distal femoral Fracture.  Patient was send to the ED yesterday as she had episode of Vomiting after eating Supper yesterday. Patient is unable to give me any detail history. But her Labs in ED were normal and she started feeling better and she was send back to Facility. She denies any Nausea or vomiting anymore. She did say her stomach does not feel good. She did eat her Breakfast and Lunch today.Her stool was occult Negative in ED.   Past Medical History:  Diagnosis Date  . Anemia   . Arthritis   . History of blood transfusion    "today is the first time" (08/05/2014)  . Hypertension    Past Surgical History:  Procedure Laterality Date  . APPENDECTOMY    .  CATARACT EXTRACTION Left   . CHOLECYSTECTOMY    . COLONOSCOPY N/A 09/26/2014   Procedure: COLONOSCOPY;  Surgeon: [name], MD;  [facility];  Service: Endoscopy;  Laterality: N/A;  1030am  . CYSTECTOMY     "had cyst taken off lower back"  . ESOPHAGOGASTRODUODENOSCOPY N/A 08/22/2014   BMW:[id number] ring at the gastro junction/small HH  . ORIF FEMUR FRACTURE Left 08/07/2014   Procedure: OPEN REDUCTION INTERNAL FIXATION (ORIF)  LEFT FEMUR FRACTURE;  Surgeon: [name], MD;  Location: [facility];  Service: Orthopedics;  Laterality: Left;  . TONSILLECTOMY      No Known Allergies  Outpatient Encounter Medications as of 06/11/2018  Medication Sig  . acetaminophen (TYLENOL) 325 MG tablet Take 650 mg by mouth every 6 (six) hours as needed.  Marland Kitchen amLODipine (NORVASC) 5 MG tablet Take 7.5 mg by mouth daily. Give 1.5 tab to =7.5 mg once a day  . ferrous sulfate 325 (65 FE) MG tablet Take 325 mg by mouth daily with breakfast.   . metoprolol (LOPRESSOR) 50 MG tablet Take 1 tablet (50 mg total) by mouth 2 (two) times daily.  . Nutritional Supplements (ENSURE ENLIVE PO) Give once day  . ondansetron (ZOFRAN) 4 MG tablet Take 4 mg by mouth 2 (two) times daily.  Vladimir Faster Glycol-Propyl Glycol (SYSTANE) 0.4-0.3 % GEL ophthalmic gel Place 1 application into both eyes 2 (two) times daily at 10 AM and 5 PM.   No facility-administered encounter medications on file as of 06/11/2018.  Review of Systems  Unable to perform ROS: Dementia    Immunization History  Administered Date(s) Administered  . Influenza-Unspecified 08/01/2017  . Pneumococcal Conjugate-13 08/01/2017  . Pneumococcal Polysaccharide-23 06/13/2016  . Tdap 08/05/2014, 05/18/2017   Pertinent  Health Maintenance Due  Topic Date Due  . INFLUENZA VACCINE  06/24/2018 (Originally 04/05/2018)  . DEXA SCAN  Completed  . PNA vac Low Risk Adult  Completed   Fall Risk  06/05/2017  Falls in the past year? Yes  Number falls in past  yr: 1  Injury with Fall? Yes   Functional Status Survey:    Vitals:   06/11/18 1549  BP: 140/69  Pulse: 64  Resp: 18  Temp: 98.1 F (36.7 C)  SpO2: 97%   There is no height or weight on file to calculate BMI. Physical Exam  Constitutional: She appears well-developed and well-nourished.  HENT:  Head: Normocephalic.  Mouth/Throat: Oropharynx is clear and moist.  Eyes: Pupils are equal, round, and reactive to light.  Neck: Neck supple.  Cardiovascular: Normal rate and regular rhythm.  Pulmonary/Chest: Effort normal and breath sounds normal.  Abdominal: Soft. Bowel sounds are normal. She exhibits no distension. There is no tenderness. There is no guarding.  Musculoskeletal: She exhibits no edema.  Neurological: She is alert.  Skin: Skin is warm and dry.  Psychiatric: She has a normal mood and affect. Her behavior is normal. Thought content normal.    Labs reviewed: Recent Labs    08/01/17 0740  04/12/18 0700 05/26/18 0310 06/10/18 2327  NA 136   < > 137 139 136  K 4.4   < > 4.5 4.5 4.5  CL 104   < > 106 108 104  CO2 22   < > 23 23 25   GLUCOSE 104*   < > 111* 91 143*  BUN 40*   < > 43* 37* 39*  CREATININE 1.96*   < > 1.89* 1.77* 1.75*  CALCIUM 9.5   < > 9.4 9.0 8.9  PHOS 3.6  --   --   --   --    < > = values in this interval not displayed.   Recent Labs    03/05/18 1452 05/26/18 0310 06/10/18 2327  AST 18 18 15   ALT 12 13 11   ALKPHOS 78 74 81  BILITOT 0.4 0.6 0.2*  PROT 7.8 6.7 7.2  ALBUMIN 4.0 3.7 3.8   Recent Labs    04/12/18 0700 05/26/18 0310 06/10/18 2327  WBC 6.4 7.6 6.0  NEUTROABS 3.8 4.4 3.6  HGB 10.5* 9.8* 11.4*  HCT 33.4* 31.1* 35.7*  MCV 84.8 84.3 84.4  PLT 300 293 300   Lab Results  Component Value Date   TSH 2.515 08/01/2017   No results found for: HGBA1C No results found for: CHOL, HDL, LDLCALC, LDLDIRECT, TRIG, CHOLHDL  Significant Diagnostic Results in last 30 days:  No results found.  Assessment/Plan  Nausea and  Vomiting Resolved now Patient did not have any imaging done in the ED. Will Get Plain abdominal Films to evaluate for Constipation Her Labs in the ED did not show any acute Process. Advanced Care Planning Patient has lump in her Lower Back and recently Nurses had noticed some possible change . It was d/w her POA and no further Work up due to her Frailty and Dementia. Will try to have Family Meeting with her POA for her ACP.    Family/ staff Communication:   Labs/tests ordered:   Total time spent in this  patient care encounter was _25 minutes; greater than 50% of the visit spent counseling patient, reviewing records , Labs and coordinating care for problems addressed at this encounter.

## 2018-06-11 NOTE — ED Provider Notes (Signed)
[facility] EMERGENCY DEPARTMENT Provider Note   CSN: [id number] Arrival date & time: 06/10/18  2304  Time seen 23:20 PM    History   Chief Complaint Chief Complaint  Patient presents with  . Emesis   Level 5 caveat for dementia  HPI Alicia Guerra is a 84 y.o. female.  HPI patient presents from her nursing facility.  She told me she had black diarrhea today wants after eating dinner and she vomited once after eating dinner.  She denies abdominal pain, feeling weak or dizzy.  However the nurse points out she denied having any diarrhea.  According to her nursing facility she had some vomiting once and it was brown in color.  Patient has a history of rectal bleeding in 2015.  She had endoscopy done at that time by Dr. Lovey Newcomer fields and she had a Schatzki ring at the GE junction, small hiatal hernia but no source for GI bleeding.  On her colonoscopy in 2015 she had a circumferential mass in the ascending colon felt to be colon adenocarcinoma, and one colonic polyp at the hepatic flexure that was not removed.  She also had a large rectal polyp that was not removed due to risk of bleeding or perforation.  Patient was referred to [facility] and she had a right colectomy and had a temporary ileostomy that was closed.  Her last follow-up was June 04, 2015.  PCP Sinda Du, MD   Past Medical History:  Diagnosis Date  . Anemia   . Arthritis   . History of blood transfusion    "today is the first time" (08/05/2014)  . Hypertension     Patient Active Problem List   Diagnosis Date Noted  . Dementia ([place]) 07/31/2017  . Osteoporosis 07/31/2017  . CKD (chronic kidney disease) stage 4, GFR 15-29 ml/min (HCC) 05/12/2017  . Rectal cancer ([place]) 01/23/2015  . Colon cancer ([place]) 10/23/2014  . Rectal bleeding 09/03/2014  . Essential hypertension 08/22/2014  . Gastrointestinal hemorrhage with melena   . Left femoral shaft fracture ([place]) 08/08/2014  . Anemia, iron deficiency  08/08/2014  . Knee fracture 08/05/2014    Past Surgical History:  Procedure Laterality Date  . APPENDECTOMY    . CATARACT EXTRACTION Left   . CHOLECYSTECTOMY    . COLONOSCOPY N/A 09/26/2014   Procedure: COLONOSCOPY;  Surgeon: [name], MD;  [facility];  Service: Endoscopy;  Laterality: N/A;  1030am  . CYSTECTOMY     "had cyst taken off lower back"  . ESOPHAGOGASTRODUODENOSCOPY N/A 08/22/2014   NWG:[id number] ring at the gastro junction/small HH  . ORIF FEMUR FRACTURE Left 08/07/2014   Procedure: OPEN REDUCTION INTERNAL FIXATION (ORIF)  LEFT FEMUR FRACTURE;  Surgeon: [name], MD;  Location: [place];  Service: Orthopedics;  Laterality: Left;  . TONSILLECTOMY       OB History    Gravida      Para      Term      Preterm      AB      Living  0     SAB      TAB      Ectopic      Multiple      Live Births               Home Medications    Prior to Admission medications   Medication Sig Start Date End Date Taking? Authorizing Provider  acetaminophen (TYLENOL) 325 MG tablet Take 650  mg by mouth every 6 (six) hours as needed.    [provider]  amLODipine (NORVASC) 5 MG tablet Take 7.5 mg by mouth daily. Give 1.5 tab to =7.5 mg once a day    [provider]  ferrous sulfate 325 (65 FE) MG tablet Take 325 mg by mouth daily with breakfast.     [provider]  metoprolol (LOPRESSOR) 50 MG tablet Take 1 tablet (50 mg total) by mouth 2 (two) times daily. 08/25/14   Kathie Dike, MD  Nutritional Supplements (ENSURE ENLIVE PO) Give once day    [provider]  ondansetron (ZOFRAN) 4 MG tablet Take 4 mg by mouth 2 (two) times daily.    [provider]  Polyethyl Glycol-Propyl Glycol (SYSTANE) 0.4-0.3 % GEL ophthalmic gel Place 1 application into both eyes 2 (two) times daily at 10 AM and 5 PM.    [provider]    Family History History reviewed. No pertinent family history.  Social  History Social History   Tobacco Use  . Smoking status: Former Smoker    Packs/day: 0.25    Years: 16.00    Pack years: 4.00    Types: Cigarettes  . Smokeless tobacco: Never Used  Substance Use Topics  . Alcohol use: No  . Drug use: No  lives in NH  Allergies   Patient has no known allergies.   Review of Systems Review of Systems  All other systems reviewed and are negative.    Physical Exam Updated Vital Signs BP (!) 179/60 (BP Location: Left Arm)   Pulse 64   Temp 98 F (36.7 C) (Oral)   Resp 14   Ht 4\' 11"  (1.499 m)   Wt 54.4 kg   SpO2 97%   BMI 24.24 kg/m   Vital signs normal except for hypertension   Physical Exam  Constitutional: She is oriented to person, place, and time. She appears well-developed and well-nourished.  Non-toxic appearance. She does not appear ill. No distress.  pleasant  HENT:  Head: Normocephalic and atraumatic.  Right Ear: External ear normal.  Left Ear: External ear normal.  Nose: Nose normal. No mucosal edema or rhinorrhea.  Mouth/Throat: Oropharynx is clear and moist and mucous membranes are normal. No dental abscesses or uvula swelling.  Eyes: Pupils are equal, round, and reactive to light. Conjunctivae and EOM are normal.  Neck: Normal range of motion and full passive range of motion without pain. Neck supple.  Cardiovascular: Normal rate, regular rhythm and normal heart sounds. Exam reveals no gallop and no friction rub.  No murmur heard. Pulmonary/Chest: Effort normal and breath sounds normal. No respiratory distress. She has no wheezes. She has no rhonchi. She has no rales. She exhibits no tenderness and no crepitus.  Abdominal: Soft. Normal appearance and bowel sounds are normal. She exhibits no distension. There is no tenderness. There is no rebound and no guarding.  Musculoskeletal: Normal range of motion. She exhibits no edema or tenderness.  Moves all extremities well.   Neurological: She is alert and oriented to  person, place, and time. She has normal strength. No cranial nerve deficit.  Skin: Skin is warm, dry and intact. No rash noted. No erythema. No pallor.  Psychiatric: She has a normal mood and affect. Her speech is normal and behavior is normal. Her mood appears not anxious.  Nursing note and vitals reviewed.    ED Treatments / Results  Labs (all labs ordered are listed, but only abnormal results are displayed)  Results for orders placed or performed during the hospital encounter of 06/10/18  Comprehensive metabolic panel  Result Value Ref Range   Sodium 136 135 - 145 mmol/L   Potassium 4.5 3.5 - 5.1 mmol/L   Chloride 104 98 - 111 mmol/L   CO2 25 22 - 32 mmol/L   Glucose, Bld 143 (H) 70 - 99 mg/dL   BUN 39 (H) 8 - 23 mg/dL   Creatinine, Ser 1.75 (H) 0.44 - 1.00 mg/dL   Calcium 8.9 8.9 - 10.3 mg/dL   Total Protein 7.2 6.5 - 8.1 g/dL   Albumin 3.8 3.5 - 5.0 g/dL   AST 15 15 - 41 U/L   ALT 11 0 - 44 U/L   Alkaline Phosphatase 81 38 - 126 U/L   Total Bilirubin 0.2 (L) 0.3 - 1.2 mg/dL   GFR calc non Af Amer 24 (L) >60 mL/min   GFR calc Af Amer 28 (L) >60 mL/min   Anion gap 7 5 - 15  CBC with Differential  Result Value Ref Range   WBC 6.0 4.0 - 10.5 K/uL   RBC 4.23 3.87 - 5.11 MIL/uL   Hemoglobin 11.4 (L) 12.0 - 15.0 g/dL   HCT 35.7 (L) 36.0 - 46.0 %   MCV 84.4 78.0 - 100.0 fL   MCH 27.0 26.0 - 34.0 pg   MCHC 31.9 30.0 - 36.0 g/dL   RDW 13.6 11.5 - 15.5 %   Platelets 300 150 - 400 K/uL   Neutrophils Relative % 60 %   Neutro Abs 3.6 1.7 - 7.7 K/uL   Lymphocytes Relative 28 %   Lymphs Abs 1.6 0.7 - 4.0 K/uL   Monocytes Relative 10 %   Monocytes Absolute 0.6 0.1 - 1.0 K/uL   Eosinophils Relative 3 %   Eosinophils Absolute 0.2 0.0 - 0.7 K/uL   Basophils Relative 1 %   Basophils Absolute 0.0 0.0 - 0.1 K/uL  Lipase, blood  Result Value Ref Range   Lipase 60 (H) 11 - 51 U/L  Sample to Blood Bank  Result Value Ref Range   Blood Bank Specimen SAMPLE AVAILABLE FOR TESTING     Sample Expiration      06/11/2018 Performed at [facility], [address], [place], [place] [address]    No results found.Laboratory interpretation all normal except mild anemia, stable renal insufficiency without increase of BUN to suggest GI bleeding, Hemoccult was negative per nursing staff   EKG None  Radiology No results found.  Procedures Procedures (including critical care time)  Medications Ordered in ED Medications - No data to display   Initial Impression / Assessment and Plan / ED Course  I have reviewed the triage vital signs and the nursing notes.  Pertinent labs & imaging results that were available during my care of the patient were reviewed by me and considered in my medical decision making (see chart for details).  Patient's hemoglobin is stable.  She has a stable renal insufficiency.  He had no vomiting in the ED.  Her stool was Hemoccult negative.  I did not feel is necessary to pass the NG tube to check the patient's stomach contents.  Patient was able to drink fluids without vomiting.  Patient is anxious to be released.   Final Clinical Impressions(s) / ED Diagnoses   Final diagnoses:  Non-intractable vomiting with nausea, unspecified vomiting type    ED Discharge Orders    None      Plan discharge  Rolland Porter, MD, Abram Sander  Rolland Porter, MD 06/11/18 [phone]

## 2018-06-11 NOTE — Discharge Instructions (Addendum)
Her laboratory tests are unchanged, her Hemoccult was negative.  She was able to drink fluids in the ED without vomiting.  Have her rechecked if she continues to have vomiting or seems worse.

## 2018-06-11 NOTE — ED Notes (Signed)
Pt provided with Ginger Ale to drink. Pt tolerated fluid challenge without complication.

## 2018-06-11 NOTE — ED Notes (Signed)
Pt has not had any episodes of emesis since in ED and not complained of any further nausea. Upon entering Pts room to re-evaluate Pt, Pt was found to be removing O2 sensor and EKG wires stating she wanted to "go home" and she did not want to be here anymore.

## 2018-06-11 NOTE — ED Notes (Signed)
POC OCCULT BLOOD TEST RESULTS:    NEGATIVE RN NOTIFIED PROVIDER: KNAPP, IVA MD.

## 2018-06-12 ENCOUNTER — Non-Acute Institutional Stay (SKILLED_NURSING_FACILITY): Payer: Medicare HMO

## 2018-06-12 DIAGNOSIS — Z0000 Encounter for general adult medical examination without abnormal findings: Secondary | ICD-10-CM

## 2018-06-12 DIAGNOSIS — K5900 Constipation, unspecified: Secondary | ICD-10-CM | POA: Diagnosis not present

## 2018-06-12 NOTE — Progress Notes (Signed)
Subjective:   Alicia Guerra is a 84 y.o. female who presents for Medicare Annual (Subsequent) preventive examination at [facility]  Last AWV-06/05/2017    Objective:     Vitals: BP 134/82 (BP Location: Left Arm, Patient Position: Sitting)   Pulse 66   Temp 98 F (36.7 C) (Oral)   Ht 4\' 11"  (1.499 m)   Wt 120 lb (54.4 kg)   BMI 24.24 kg/m   Body mass index is 24.24 kg/m.  Advanced Directives 06/12/2018 05/25/2018 04/12/2018 04/11/2018 03/27/2018 03/09/2018 03/05/2018  Does Patient Have a Medical Advance Directive? Yes Yes Yes Yes Yes Yes Yes  Type of Advance Directive (No Data) (No Data) (No Data) (No Data) (No Data) (No Data) (No Data)  Does patient want to make changes to medical advance directive? No - Patient declined No - Patient declined No - Patient declined No - Patient declined No - Patient declined No - Patient declined No - Patient declined  Copy of [place] in Chart? No - copy requested No - copy requested No - copy requested No - copy requested No - copy requested No - copy requested No - copy requested  Would patient like information on creating a medical advance directive? - - - - - - -    Tobacco Social History   Tobacco Use  Smoking Status Former Smoker  . Packs/day: 0.25  . Years: 16.00  . Pack years: 4.00  . Types: Cigarettes  Smokeless Tobacco Never Used     Counseling given: Not Answered   Clinical Intake:  Pre-visit preparation completed: No  Pain : No/denies pain     Diabetes: No  How often do you need to have someone help you when you read instructions, pamphlets, or other written materials from your doctor or pharmacy?: 4 - Often  Interpreter Needed?: No  Information entered by :: Tyson Dense, RN  Past Medical History:  Diagnosis Date  . Anemia   . Arthritis   . History of blood transfusion    "today is the first time" (08/05/2014)  . Hypertension    Past Surgical History:  Procedure  Laterality Date  . APPENDECTOMY    . CATARACT EXTRACTION Left   . CHOLECYSTECTOMY    . COLONOSCOPY N/A 09/26/2014   Procedure: COLONOSCOPY;  Surgeon: [name], MD;  [facility];  Service: Endoscopy;  Laterality: N/A;  1030am  . CYSTECTOMY     "had cyst taken off lower back"  . ESOPHAGOGASTRODUODENOSCOPY N/A 08/22/2014   VHQ:[id number] ring at the gastro junction/small HH  . ORIF FEMUR FRACTURE Left 08/07/2014   Procedure: OPEN REDUCTION INTERNAL FIXATION (ORIF)  LEFT FEMUR FRACTURE;  Surgeon: [name], MD;  Location: [place];  Service: Orthopedics;  Laterality: Left;  . TONSILLECTOMY     History reviewed. No pertinent family history. Social History   Socioeconomic History  . Marital status: Widowed    Spouse name: Not on file  . Number of children: Not on file  . Years of education: Not on file  . Highest education level: Not on file  Occupational History  . Not on file  Social Needs  . Financial resource strain: Not hard at all  . Food insecurity:    Worry: Never true    Inability: Never true  . Transportation needs:    Medical: No    Non-medical: No  Tobacco Use  . Smoking status: Former Smoker    Packs/day: 0.25  Years: 16.00    Pack years: 4.00    Types: Cigarettes  . Smokeless tobacco: Never Used  Substance and Sexual Activity  . Alcohol use: No  . Drug use: No  . Sexual activity: Never  Lifestyle  . Physical activity:    Days per week: 0 days    Minutes per session: 0 min  . Stress: Not at all  Relationships  . Social connections:    Talks on phone: Never    Gets together: Once a week    Attends religious service: Never    Active member of club or organization: No    Attends meetings of clubs or organizations: Never    Relationship status: Widowed  Other Topics Concern  . Not on file  Social History Narrative  . Not on file    Outpatient Encounter Medications as of 06/12/2018  Medication Sig  . acetaminophen (TYLENOL) 325 MG  tablet Take 650 mg by mouth every 6 (six) hours as needed.  Marland Kitchen amLODipine (NORVASC) 5 MG tablet Take 7.5 mg by mouth daily. Give 1.5 tab to =7.5 mg once a day  . ferrous sulfate 325 (65 FE) MG tablet Take 325 mg by mouth daily with breakfast.   . metoprolol (LOPRESSOR) 50 MG tablet Take 1 tablet (50 mg total) by mouth 2 (two) times daily.  . Nutritional Supplements (ENSURE ENLIVE PO) Give once day  . ondansetron (ZOFRAN) 4 MG tablet Take 4 mg by mouth 2 (two) times daily.  Vladimir Faster Glycol-Propyl Glycol (SYSTANE) 0.4-0.3 % GEL ophthalmic gel Place 1 application into both eyes 2 (two) times daily at 10 AM and 5 PM.   No facility-administered encounter medications on file as of 06/12/2018.     Activities of Daily Living In your present state of health, do you have any difficulty performing the following activities: 06/12/2018  Hearing? Y  Vision? Y  Difficulty concentrating or making decisions? Y  Walking or climbing stairs? Y  Dressing or bathing? Y  Doing errands, shopping? Y  Preparing Food and eating ? Y  Using the Toilet? Y  In the past six months, have you accidently leaked urine? Y  Do you have problems with loss of bowel control? Y  Managing your Medications? Y  Managing your Finances? Y  Housekeeping or managing your Housekeeping? Y  Some recent data might be hidden    Patient Care Team: Sinda Du, MD as PCP - General (Pulmonary Disease) [name], MD as Consulting Physician (Gastroenterology)    Assessment:   This is a routine wellness examination for Alicia Guerra.  Exercise Activities and Dietary recommendations Current Exercise Habits: The patient does not participate in regular exercise at present, Exercise limited by: neurologic condition(s);orthopedic condition(s)  Goals   None     Fall Risk Fall Risk  06/12/2018 06/05/2017  Falls in the past year? No Yes  Number falls in past yr: - 1  Injury with Fall? - Yes   Is the patient's home free of loose  throw rugs in walkways, pet beds, electrical cords, etc?   yes      Grab bars in the bathroom? yes      Handrails on the stairs?   yes      Adequate lighting?   yes  Depression Screen PHQ 2/9 Scores 06/12/2018 06/05/2017  PHQ - 2 Score 0 0     Cognitive Function     6CIT Screen 06/12/2018 06/05/2017  What Year? 4 points 4 points  What month? 3  points 3 points  What time? 3 points 3 points  Count back from 20 4 points 4 points  Months in reverse 4 points 4 points  Repeat phrase 10 points 10 points  Total Score 28 28    Immunization History  Administered Date(s) Administered  . Influenza-Unspecified 08/01/2017  . Pneumococcal Conjugate-13 08/01/2017  . Pneumococcal Polysaccharide-23 06/13/2016  . Tdap 08/05/2014, 05/18/2017    Qualifies for Shingles Vaccine? Not in past records  Screening Tests Health Maintenance  Topic Date Due  . INFLUENZA VACCINE  06/24/2018 (Originally 04/05/2018)  . TETANUS/TDAP  05/19/2027  . DEXA SCAN  Completed  . PNA vac Low Risk Adult  Completed    Cancer Screenings: Lung: Low Dose CT Chest recommended if Age 36-80 years, 30 pack-year currently smoking OR have quit w/in 15years. Patient does not qualify. Breast:  Up to date on Mammogram? Yes   Up to date of Bone Density/Dexa? Yes Colorectal: up to date  Additional Screenings:  Hepatitis C Screening: declined Flu vaccine due: will receive at [place]:    I have personally reviewed and addressed the Medicare Annual Wellness questionnaire and have noted the following in the patient's chart:  A. Medical and social history B. Use of alcohol, tobacco or illicit drugs  C. Current medications and supplements D. Functional ability and status E.  Nutritional status F.  Physical activity G. Advance directives H. List of other physicians I.  Hospitalizations, surgeries, and ER visits in previous 12 months J.  [place] to include hearing, vision, cognitive,  depression L. Referrals and appointments - none  In addition, I have reviewed and discussed with patient certain preventive protocols, quality metrics, and best practice recommendations. A written personalized care plan for preventive services as well as general preventive health recommendations were provided to patient.  See attached scanned questionnaire for additional information.   Signed,   Tyson Dense, RN Nurse Health Advisor  Patient Concerns: None

## 2018-06-12 NOTE — Patient Instructions (Signed)
Alicia Guerra , Thank you for taking time to come for your Medicare Wellness Visit. I appreciate your ongoing commitment to your health goals. Please review the following plan we discussed and let me know if I can assist you in the future.   Screening recommendations/referrals: Colonoscopy excluded, over age 82 Mammogram excluded, over age 35 Bone Density up to date Recommended yearly ophthalmology/optometry visit for glaucoma screening and checkup Recommended yearly dental visit for hygiene and checkup  Vaccinations: Influenza vaccine due, will receive at [facility], [place] Pneumococcal vaccine up to date, completed Tdap vaccine up to date, due 05/19/2027 Shingles vaccine not in past recrods    Advanced directives: in chart  Conditions/risks identified: none  Next appointment: Dr. Lyndel Safe makes rounds   Preventive Care 65 Years and Older, Female Preventive care refers to lifestyle choices and visits with your health care provider that can promote health and wellness. What does preventive care include?  A yearly physical exam. This is also called an annual well check.  Dental exams once or twice a year.  Routine eye exams. Ask your health care provider how often you should have your eyes checked.  Personal lifestyle choices, including:  Daily care of your teeth and gums.  Regular physical activity.  Eating a healthy diet.  Avoiding tobacco and drug use.  Limiting alcohol use.  Practicing safe sex.  Taking low-dose aspirin every day.  Taking vitamin and mineral supplements as recommended by your health care provider. What happens during an annual well check? The services and screenings done by your health care provider during your annual well check will depend on your age, overall health, lifestyle risk factors, and family history of disease. Counseling  Your health care provider may ask you questions about your:  Alcohol use.  Tobacco use.  Drug use.  Emotional  well-being.  Home and relationship well-being.  Sexual activity.  Eating habits.  History of falls.  Memory and ability to understand (cognition).  Work and work Statistician.  Reproductive health. Screening  You may have the following tests or measurements:  Height, weight, and BMI.  Blood pressure.  Lipid and cholesterol levels. These may be checked every 5 years, or more frequently if you are over 74 years old.  Skin check.  Lung cancer screening. You may have this screening every year starting at age 55 if you have a 30-pack-year history of smoking and currently smoke or have quit within the past 15 years.  Fecal occult blood test (FOBT) of the stool. You may have this test every year starting at age 34.  Flexible sigmoidoscopy or colonoscopy. You may have a sigmoidoscopy every 5 years or a colonoscopy every 10 years starting at age 42.  Hepatitis C blood test.  Hepatitis B blood test.  Sexually transmitted disease (STD) testing.  Diabetes screening. This is done by checking your blood sugar (glucose) after you have not eaten for a while (fasting). You may have this done every 1-3 years.  Bone density scan. This is done to screen for osteoporosis. You may have this done starting at age 8.  Mammogram. This may be done every 1-2 years. Talk to your health care provider about how often you should have regular mammograms. Talk with your health care provider about your test results, treatment options, and if necessary, the need for more tests. Vaccines  Your health care provider may recommend certain vaccines, such as:  Influenza vaccine. This is recommended every year.  Tetanus, diphtheria, and acellular pertussis (Tdap, Td) vaccine.  You may need a Td booster every 10 years.  Zoster vaccine. You may need this after age 22.  Pneumococcal 13-valent conjugate (PCV13) vaccine. One dose is recommended after age 76.  Pneumococcal polysaccharide (PPSV23) vaccine. One  dose is recommended after age 69. Talk to your health care provider about which screenings and vaccines you need and how often you need them. This information is not intended to replace advice given to you by your health care provider. Make sure you discuss any questions you have with your health care provider. Document Released: 09/18/2015 Document Revised: 05/11/2016 Document Reviewed: 06/23/2015 Elsevier Interactive Patient Education  2017 [place] Prevention in the Home Falls can cause injuries. They can happen to people of all ages. There are many things you can do to make your home safe and to help prevent falls. What can I do on the outside of my home?  Regularly fix the edges of walkways and driveways and fix any cracks.  Remove anything that might make you trip as you walk through a door, such as a raised step or threshold.  Trim any bushes or trees on the path to your home.  Use bright outdoor lighting.  Clear any walking paths of anything that might make someone trip, such as rocks or tools.  Regularly check to see if handrails are loose or broken. Make sure that both sides of any steps have handrails.  Any raised decks and porches should have guardrails on the edges.  Have any leaves, snow, or ice cleared regularly.  Use sand or salt on walking paths during winter.  Clean up any spills in your garage right away. This includes oil or grease spills. What can I do in the bathroom?  Use night lights.  Install grab bars by the toilet and in the tub and shower. Do not use towel bars as grab bars.  Use non-skid mats or decals in the tub or shower.  If you need to sit down in the shower, use a plastic, non-slip stool.  Keep the floor dry. Clean up any water that spills on the floor as soon as it happens.  Remove soap buildup in the tub or shower regularly.  Attach bath mats securely with double-sided non-slip rug tape.  Do not have throw rugs and other  things on the floor that can make you trip. What can I do in the bedroom?  Use night lights.  Make sure that you have a light by your bed that is easy to reach.  Do not use any sheets or blankets that are too big for your bed. They should not hang down onto the floor.  Have a firm chair that has side arms. You can use this for support while you get dressed.  Do not have throw rugs and other things on the floor that can make you trip. What can I do in the kitchen?  Clean up any spills right away.  Avoid walking on wet floors.  Keep items that you use a lot in easy-to-reach places.  If you need to reach something above you, use a strong step stool that has a grab bar.  Keep electrical cords out of the way.  Do not use floor polish or wax that makes floors slippery. If you must use wax, use non-skid floor wax.  Do not have throw rugs and other things on the floor that can make you trip. What can I do with my stairs?  Do not leave any  items on the stairs.  Make sure that there are handrails on both sides of the stairs and use them. Fix handrails that are broken or loose. Make sure that handrails are as long as the stairways.  Check any carpeting to make sure that it is firmly attached to the stairs. Fix any carpet that is loose or worn.  Avoid having throw rugs at the top or bottom of the stairs. If you do have throw rugs, attach them to the floor with carpet tape.  Make sure that you have a light switch at the top of the stairs and the bottom of the stairs. If you do not have them, ask someone to add them for you. What else can I do to help prevent falls?  Wear shoes that:  Do not have high heels.  Have rubber bottoms.  Are comfortable and fit you well.  Are closed at the toe. Do not wear sandals.  If you use a stepladder:  Make sure that it is fully opened. Do not climb a closed stepladder.  Make sure that both sides of the stepladder are locked into place.  Ask  someone to hold it for you, if possible.  Clearly mark and make sure that you can see:  Any grab bars or handrails.  First and last steps.  Where the edge of each step is.  Use tools that help you move around (mobility aids) if they are needed. These include:  Canes.  Walkers.  Scooters.  Crutches.  Turn on the lights when you go into a dark area. Replace any light bulbs as soon as they burn out.  Set up your furniture so you have a clear path. Avoid moving your furniture around.  If any of your floors are uneven, fix them.  If there are any pets around you, be aware of where they are.  Review your medicines with your doctor. Some medicines can make you feel dizzy. This can increase your chance of falling. Ask your doctor what other things that you can do to help prevent falls. This information is not intended to replace advice given to you by your health care provider. Make sure you discuss any questions you have with your health care provider. Document Released: 06/18/2009 Document Revised: 01/28/2016 Document Reviewed: 09/26/2014 Elsevier Interactive Patient Education  2017 [employer].

## 2018-07-02 DIAGNOSIS — H11043 Peripheral pterygium, stationary, bilateral: Secondary | ICD-10-CM | POA: Diagnosis not present

## 2018-07-02 DIAGNOSIS — H2511 Age-related nuclear cataract, right eye: Secondary | ICD-10-CM | POA: Diagnosis not present

## 2018-07-02 DIAGNOSIS — H04123 Dry eye syndrome of bilateral lacrimal glands: Secondary | ICD-10-CM | POA: Diagnosis not present

## 2018-07-02 DIAGNOSIS — Z961 Presence of intraocular lens: Secondary | ICD-10-CM | POA: Diagnosis not present

## 2018-07-03 ENCOUNTER — Non-Acute Institutional Stay (SKILLED_NURSING_FACILITY): Payer: Medicare HMO | Admitting: Internal Medicine

## 2018-07-03 ENCOUNTER — Encounter: Payer: Self-pay | Admitting: Internal Medicine

## 2018-07-03 DIAGNOSIS — I10 Essential (primary) hypertension: Secondary | ICD-10-CM

## 2018-07-03 DIAGNOSIS — F0390 Unspecified dementia without behavioral disturbance: Secondary | ICD-10-CM | POA: Diagnosis not present

## 2018-07-03 DIAGNOSIS — M810 Age-related osteoporosis without current pathological fracture: Secondary | ICD-10-CM

## 2018-07-03 DIAGNOSIS — N184 Chronic kidney disease, stage 4 (severe): Secondary | ICD-10-CM | POA: Diagnosis not present

## 2018-07-03 NOTE — Progress Notes (Signed)
Location:    [place] Room Number: 113/D Place of Service:  SNF [phone]) Provider:  Veleta Miners MD  Sinda Du, MD  Patient Care Team: Sinda Du, MD as PCP - General (Pulmonary Disease) [name], MD as Consulting Physician (Gastroenterology)  Extended Emergency Contact Information Primary Emergency Contact: [name] Phone: [phone] Mobile Phone: [phone] Relation: Niece Secondary Emergency Contact: [place] of [place] Phone: [phone] Relation: Neighbor  Code Status:  Full Code Goals of care: Advanced Directive information Advanced Directives 07/03/2018  Does Patient Have a Medical Advance Directive? Yes  Type of Advance Directive (No Data)  Does patient want to make changes to medical advance directive? No - Patient declined  Copy of [place] in Chart? No - copy requested  Would patient like information on creating a medical advance directive? -     Chief Complaint  Patient presents with  . Medical Management of Chronic Issues    Routine visit of medical management    HPI:  Pt is a 84 y.o. female seen today for medical management of chronic diseases.    Patient has h/o Hypertension, Colorectal cancer s/p Right Colectomy, Anemia, Chronic renal disease.Stage 4, H/o Left Knee and Distal femoral Fracture.  Patient is Long term resident of facility. She did have some issues with Nausea and Vomiting and was found to have Moderate Constipation. She has been started on Linzess and had done well. Her weight is stable at 110 lbs. She has some chronic complains of Left Knee pain. Her Xray before has shown Arthritis.And she has h/o Distal Femoral Fracture She does not have any swelling or Recent Injury to that leg.  Past Medical History:  Diagnosis Date  . Anemia   . Arthritis   . History of blood transfusion    "today is the first time"  (08/05/2014)  . Hypertension    Past Surgical History:  Procedure Laterality Date  . APPENDECTOMY    . CATARACT EXTRACTION Left   . CHOLECYSTECTOMY    . COLONOSCOPY N/A 09/26/2014   Procedure: COLONOSCOPY;  Surgeon: [name], MD;  [facility];  Service: Endoscopy;  Laterality: N/A;  1030am  . CYSTECTOMY     "had cyst taken off lower back"  . ESOPHAGOGASTRODUODENOSCOPY N/A 08/22/2014   NKN:LZJQBHAL ring at the gastro junction/small HH  . ORIF FEMUR FRACTURE Left 08/07/2014   Procedure: OPEN REDUCTION INTERNAL FIXATION (ORIF)  LEFT FEMUR FRACTURE;  Surgeon: [name], MD;  Location: [facility];  Service: Orthopedics;  Laterality: Left;  . TONSILLECTOMY      No Known Allergies  Outpatient Encounter Medications as of 07/03/2018  Medication Sig  . acetaminophen (TYLENOL) 325 MG tablet Take 650 mg by mouth every 6 (six) hours as needed.  Marland Kitchen amLODipine (NORVASC) 5 MG tablet Take 7.5 mg by mouth daily. Give 1.5 tab to =7.5 mg once a day  . ferrous sulfate 325 (65 FE) MG tablet Take 325 mg by mouth daily with breakfast.   . linaclotide (LINZESS) 145 MCG CAPS capsule Take 145 mcg by mouth daily before breakfast.  . metoprolol (LOPRESSOR) 50 MG tablet Take 1 tablet (50 mg total) by mouth 2 (two) times daily.  . Nutritional Supplements (ENSURE ENLIVE PO) Give once day  . ondansetron (ZOFRAN) 4 MG tablet Take 4 mg by mouth 2 (two) times daily.  Vladimir Faster Glycol-Propyl Glycol (SYSTANE) 0.4-0.3 % GEL ophthalmic gel Place 1  application into both eyes 2 (two) times daily at 10 AM and 5 PM.   No facility-administered encounter medications on file as of 07/03/2018.      Review of Systems  Constitutional: Negative.   HENT: Negative.   Respiratory: Negative.   Cardiovascular: Negative.   Gastrointestinal: Positive for abdominal pain.  Genitourinary: Negative.   Musculoskeletal: Positive for arthralgias and back pain.  Neurological: Negative.   Psychiatric/Behavioral:  Negative.     Immunization History  Administered Date(s) Administered  . Influenza-Unspecified 08/01/2017  . Pneumococcal Conjugate-13 08/01/2017  . Pneumococcal Polysaccharide-23 06/13/2016  . Tdap 08/05/2014, 05/18/2017   Pertinent  Health Maintenance Due  Topic Date Due  . INFLUENZA VACCINE  Completed  . DEXA SCAN  Completed  . PNA vac Low Risk Adult  Completed   Fall Risk  06/12/2018 06/05/2017  Falls in the past year? No Yes  Number falls in past yr: - 1  Injury with Fall? - Yes   Functional Status Survey:    Vitals:   07/03/18 1029  BP: (!) 115/50  Pulse: (!) 55  Resp: 17  Temp: (!) 96.5 F (35.8 C)  TempSrc: Oral  SpO2: 94%  Weight: 110 lb (49.9 kg)  Height: 4\' 11"  (1.499 m)   Body mass index is 22.22 kg/m. Physical Exam  Constitutional: She appears well-developed and well-nourished.  HENT:  Head: Normocephalic.  Mouth/Throat: Oropharynx is clear and moist.  Eyes: Pupils are equal, round, and reactive to light.  Neck: Neck supple.  Cardiovascular: Normal rate and regular rhythm.  Pulmonary/Chest: Effort normal and breath sounds normal.  Abdominal: Soft. Bowel sounds are normal. She exhibits no distension. There is no tenderness. There is no guarding.  Musculoskeletal: She exhibits no edema.  Left Knee did not have any Swelling or Tenderness  Neurological: She is alert.  Not Oriented. Did not know the Name of the Place or her DOB. She is mostly Wheelchair Bound. Walks with MOD assist  Skin: Skin is warm and dry.  Psychiatric: She has a normal mood and affect. Her behavior is normal. Thought content normal.    Labs reviewed: Recent Labs    08/01/17 0740  04/12/18 0700 05/26/18 0310 06/10/18 2327  NA 136   < > 137 139 136  K 4.4   < > 4.5 4.5 4.5  CL 104   < > 106 108 104  CO2 22   < > 23 23 25   GLUCOSE 104*   < > 111* 91 143*  BUN 40*   < > 43* 37* 39*  CREATININE 1.96*   < > 1.89* 1.77* 1.75*  CALCIUM 9.5   < > 9.4 9.0 8.9  PHOS 3.6  --   --    --   --    < > = values in this interval not displayed.   Recent Labs    03/05/18 1452 05/26/18 0310 06/10/18 2327  AST 18 18 15   ALT 12 13 11   ALKPHOS 78 74 81  BILITOT 0.4 0.6 0.2*  PROT 7.8 6.7 7.2  ALBUMIN 4.0 3.7 3.8   Recent Labs    04/12/18 0700 05/26/18 0310 06/10/18 2327  WBC 6.4 7.6 6.0  NEUTROABS 3.8 4.4 3.6  HGB 10.5* 9.8* 11.4*  HCT 33.4* 31.1* 35.7*  MCV 84.8 84.3 84.4  PLT 300 293 300   Lab Results  Component Value Date   TSH 2.515 08/01/2017   No results found for: HGBA1C No results found for: CHOL, HDL, LDLCALC, LDLDIRECT, TRIG, CHOLHDL  Significant Diagnostic Results in last 30 days:  No results found.  Assessment/Plan Left Knee Pain Chronic Will Continue to follow. Tylenol PRN for now  Essential hypertension BPControlled on Norvasc and Metoprolol.  Chronic kidney disease, stage4with Hyperkalemia Stable Creat and Potassium  Anemia Due to Chronic Renal disease  On Iron supplement.  Constipation with Abdominal Discomfort Patient was started on Linzess and has Been doing well since then  Dementia Continue supportive care. Not on Any treatment.  Osteoporosis With Tscore of -2.6 with h/o Fracture .in CKD Stage4 GFR Less then 35 Started on [place] Patient has lump in her Lower Back and recently Nurses had noticed some possible change . Nurses have d/w her POA and she said further Work up due to her Frailty and Dementia.  Have been trying to arrange the  Family Meeting with her POA for her ACP.But her Niece works and is unable to come.   Family/ staff Communication:   Labs/tests ordered:

## 2018-07-16 ENCOUNTER — Encounter: Payer: Self-pay | Admitting: Internal Medicine

## 2018-07-16 ENCOUNTER — Non-Acute Institutional Stay (SKILLED_NURSING_FACILITY): Payer: Medicare HMO | Admitting: Internal Medicine

## 2018-07-16 DIAGNOSIS — K5900 Constipation, unspecified: Secondary | ICD-10-CM

## 2018-07-16 DIAGNOSIS — I10 Essential (primary) hypertension: Secondary | ICD-10-CM

## 2018-07-16 DIAGNOSIS — R269 Unspecified abnormalities of gait and mobility: Secondary | ICD-10-CM

## 2018-07-16 NOTE — Progress Notes (Signed)
Location:    [facility] Room Number: 113/P Place of Service:  SNF [phone]) Provider:  Terisa Starr, MD  Patient Care Team: Sinda Du, MD as PCP - General (Pulmonary Disease) [name], MD as Consulting Physician (Gastroenterology)  Extended Emergency Contact Information Primary Emergency Contact: [name] Phone: [phone] Mobile Phone: [phone] Relation: Niece Secondary Emergency Contact: [place] Phone: [phone] Relation: Neighbor  Code Status:  Full Code Goals of care: Advanced Directive information Advanced Directives 07/16/2018  Does Patient Have a Medical Advance Directive? Yes  Type of Advance Directive (No Data)  Does patient want to make changes to medical advance directive? No - Patient declined  Copy of [place] in Chart? No - copy requested  Would patient like information on creating a medical advance directive? -     Chief Complaint  Patient presents with  . Acute Visit    F/U Fall  --As well as hypertension  HPI:  Pt is a 84 y.o. female seen today for an acute visit for follow-up of a fall over the weekend also follow-up of hypertension.  Patient has h/o Hypertension, Colorectal cancer s/p Right Colectomy, Anemia, Chronic renal disease.Stage 4, H/o Left Knee and Distal femoral Fracture.  She is a long-term resident of facility and appears to have a period of stability her nausea and vomiting appears to have improved she is thought to have constipation and has been started on Linzess and appears to be responding well to this.  Apparently over the weekend she got up out of her wheelchair reaching for something in her closet and and ended up sliding to the floor apparently without any injury-there was a nursing note about an abrasion to her left thumb but I could not really pick up on that today and she is not  complaining of any pain in this regards she is somewhat of a poor historian secondary to dementia but appears to be at her baseline.  Regards to hypertension she is on Lopressor 50 mg twice a day and Norvasc 7.5 mg a day looks like her systolics are somewhat elevated in the 140s to 150s at times the lowest one I see recently is a systolic of 235'TD got a manual reading of 150/62 today.  Currently again she has no complaints vital signs appear to be stable has a somewhat elevated blood pressure but other than that within normal range  Past Medical History:  Diagnosis Date  . Anemia   . Arthritis   . History of blood transfusion    "today is the first time" (08/05/2014)  . Hypertension    Past Surgical History:  Procedure Laterality Date  . APPENDECTOMY    . CATARACT EXTRACTION Left   . CHOLECYSTECTOMY    . COLONOSCOPY N/A 09/26/2014   Procedure: COLONOSCOPY;  Surgeon: [name], MD;  [facility];  Service: Endoscopy;  Laterality: N/A;  1030am  . CYSTECTOMY     "had cyst taken off lower back"  . ESOPHAGOGASTRODUODENOSCOPY N/A 08/22/2014   DUK:[id number] ring at the gastro junction/small HH  . ORIF FEMUR FRACTURE Left 08/07/2014   Procedure: OPEN REDUCTION INTERNAL FIXATION (ORIF)  LEFT FEMUR FRACTURE;  Surgeon: [name], MD;  Location: [facility];  Service: Orthopedics;  Laterality: Left;  . TONSILLECTOMY      No Known Allergies  Outpatient Encounter Medications as of 07/16/2018  Medication Sig  .  acetaminophen (TYLENOL) 325 MG tablet Take 650 mg by mouth every 6 (six) hours as needed.  Marland Kitchen amLODipine (NORVASC) 5 MG tablet Take 7.5 mg by mouth daily. Give 1.5 tab to =7.5 mg once a day  . ferrous sulfate 325 (65 FE) MG tablet Take 325 mg by mouth daily with breakfast.   . linaclotide (LINZESS) 145 MCG CAPS capsule Take 145 mcg by mouth daily before breakfast.  . metoprolol (LOPRESSOR) 50 MG tablet Take 1 tablet (50 mg total) by mouth 2 (two) times daily.  .  ondansetron (ZOFRAN) 4 MG tablet Take 4 mg by mouth 2 (two) times daily.  Vladimir Faster Glycol-Propyl Glycol (SYSTANE) 0.4-0.3 % GEL ophthalmic gel Place 1 application into both eyes 2 (two) times daily at 10 AM and 5 PM.  . [DISCONTINUED] Nutritional Supplements (ENSURE ENLIVE PO) Give once day   No facility-administered encounter medications on file as of 07/16/2018.     Review of Systems   This is limited secondary to dementia.  In general she is not complaining of any fever chills.  Skin does not complain of rashes or itching.  Cannot really appreciate an abrasion on her left thumb.  Head ears eyes nose mouth and throat she does not complain of visual changes or sore throat.  Respiratory denies shortness of breath or cough.  Cardiac denies chest pain.  GI is not complaining of abdominal discomfort or recent nausea or vomiting.  Does not complain of constipation appears she is responding well to Linzess.  GU is not complaining of dysuria.  Muscular skeletal has some chronic knee pain but is not really complaining of that even today does not complain of joint pain.  Neurologic does not complain of dizziness headache or syncope or fall apparently was not preceded by any syncope.  Psych does have a history of dementia but does well with supportive care does not complain of being anxious or depressed  Immunization History  Administered Date(s) Administered  . Influenza-Unspecified 08/01/2017  . Pneumococcal Conjugate-13 08/01/2017  . Pneumococcal Polysaccharide-23 06/13/2016  . Tdap 08/05/2014, 05/18/2017   Pertinent  Health Maintenance Due  Topic Date Due  . INFLUENZA VACCINE  Completed  . DEXA SCAN  Completed  . PNA vac Low Risk Adult  Completed   Fall Risk  06/12/2018 06/05/2017  Falls in the past year? No Yes  Number falls in past yr: - 1  Injury with Fall? - Yes   Functional Status Survey:    Vitals:   07/16/18 1326  BP: 130/66  Pulse: 64  There is no  height or weight on file to calculate BMI. Physical Exam  She is afebrile respirations of 16 manual blood pressure today was 150/62.  In general this is a pleasant elderly female no distress sitting comfortably in her wheelchair.  Her skin is warm and dry.  Eyes visual acuity appears to be intact sclera and conjunctive are clear.  Oropharynx is clear mucous membranes moist tongue appears to be midline.  Chest is clear to auscultation there is no labored breathing.  Heart is regular rate and rhythm without murmur gallop or rub he does not have significant lower extremity edema.  Abdomen is soft nontender with positive bowel sounds.  Musculoskeletal moves her extremities at baseline limited range of motion of her knees bilaterally with arthritic changes but this is not new-I do not note any abrasions of her hands or fingers-she does move her fingers with normal flexion and extension without pain-I do not really see  any abnormalities of her left thumb.  Neurologic she is alert moves her extremities at baseline no true lateralizing findings.  Psych she is oriented to self is pleasant appropriate follows verbal commands without difficulty     Labs reviewed: Recent Labs    08/01/17 0740  04/12/18 0700 05/26/18 0310 06/10/18 2327  NA 136   < > 137 139 136  K 4.4   < > 4.5 4.5 4.5  CL 104   < > 106 108 104  CO2 22   < > 23 23 25   GLUCOSE 104*   < > 111* 91 143*  BUN 40*   < > 43* 37* 39*  CREATININE 1.96*   < > 1.89* 1.77* 1.75*  CALCIUM 9.5   < > 9.4 9.0 8.9  PHOS 3.6  --   --   --   --    < > = values in this interval not displayed.   Recent Labs    03/05/18 1452 05/26/18 0310 06/10/18 2327  AST 18 18 15   ALT 12 13 11   ALKPHOS 78 74 81  BILITOT 0.4 0.6 0.2*  PROT 7.8 6.7 7.2  ALBUMIN 4.0 3.7 3.8   Recent Labs    04/12/18 0700 05/26/18 0310 06/10/18 2327  WBC 6.4 7.6 6.0  NEUTROABS 3.8 4.4 3.6  HGB 10.5* 9.8* 11.4*  HCT 33.4* 31.1* 35.7*  MCV 84.8 84.3 84.4    PLT 300 293 300   Lab Results  Component Value Date   TSH 2.515 08/01/2017   No results found for: HGBA1C No results found for: CHOL, HDL, LDLCALC, LDLDIRECT, TRIG, CHOLHDL  Significant Diagnostic Results in last 30 days:  No results found.  Assessment/Plan  #1 fall with no apparent injury--gait abnormality-- as noted above I did not really know any significant pain or abnormalities at this point will monitor- I do not see any medications which could have contributed to the fall but will have to be watched.  2.  Hypertension appears her systolics are fairly consistently somewhat elevated above 140 occasionally she will have some 130s-again I get a manual reading in the 150 area today-will increase her Norvasc slightly up to 10 mg a day continue metoprolol 50 mg twice daily and monitor blood pressures once a day.  3.  Constipation this appears to be doing better on the Linzess.  [id number]

## 2018-07-25 ENCOUNTER — Encounter: Payer: Self-pay | Admitting: Internal Medicine

## 2018-07-25 ENCOUNTER — Non-Acute Institutional Stay (SKILLED_NURSING_FACILITY): Payer: Medicare HMO | Admitting: Internal Medicine

## 2018-07-25 DIAGNOSIS — R937 Abnormal findings on diagnostic imaging of other parts of musculoskeletal system: Secondary | ICD-10-CM

## 2018-07-25 DIAGNOSIS — R229 Localized swelling, mass and lump, unspecified: Secondary | ICD-10-CM

## 2018-07-25 DIAGNOSIS — IMO0002 Reserved for concepts with insufficient information to code with codable children: Secondary | ICD-10-CM

## 2018-07-25 NOTE — Progress Notes (Signed)
This is an acute visit.  Level care skilled.  Facility is [facility].  Chief complaint acute visit follow-up x-ray results with discussion with niece.  History of present illness.  Patient is a pleasant a 84 year old female who was seen today for for follow-up of an x-ray done actually approximately 2 months ago.  Patient does have a chronic lump on her back but it had increased in size somewhat so this August area was x-rayed and showed mild to moderate degenerative changes in lumbar spine and grade 1 spondylolisthesis at L4-L5.  It also showed some increased density and sclerosis seen involving the L1 vertebral body and stated metastatic lesion could not be excluded recommend possible follow-up with a bone scan or MRI.  Responsible party her niece is quite busy and works and is been difficult to contact her however nursing did talk with her previously and niece did state she did not want any aggressive work-up because of Alicia Guerra's frailty and dementia.  However I was able to come across the niece today in the facility and I did readdress the issue-and stated there was no right or wrong choice- but if something concerning was found by follow-up studies treatment would most likely would be something invasive and not sure how Alicia Guerra would tolerate this and how her quality of life would be.  Her niece did express understanding at this point reiterated this she does not really want aggressive treatment- she stated her aunt appears to be comfortable presently  Alicia Guerra currently appears to be at baseline does not have any complaints-fact when I saw her this evening she was washing dishes in the sink appeared to be doing well.  Past Medical History:  Diagnosis Date  . Anemia   . Arthritis   . History of blood transfusion    "today is the first time" (08/05/2014)  . Hypertension         Past Surgical History:  Procedure Laterality Date  . APPENDECTOMY    . CATARACT  EXTRACTION Left   . CHOLECYSTECTOMY    . COLONOSCOPY N/A 09/26/2014   Procedure: COLONOSCOPY;  Surgeon: [name], MD;  [facility];  Service: Endoscopy;  Laterality: N/A;  1030am  . CYSTECTOMY     "had cyst taken off lower back"  . ESOPHAGOGASTRODUODENOSCOPY N/A 08/22/2014   KGM:[id number] ring at the gastro junction/small HH  . ORIF FEMUR FRACTURE Left 08/07/2014   Procedure: OPEN REDUCTION INTERNAL FIXATION (ORIF)  LEFT FEMUR FRACTURE;  Surgeon: [name], MD;  Location: [facility];  Service: Orthopedics;  Laterality: Left;  . TONSILLECTOMY      No Known Allergies    MEDICATIONS     Medication Sig  . acetaminophen (TYLENOL) 325 MG tablet Take 650 mg by mouth every 6 (six) hours as needed.  Marland Kitchen amLODipine (NORVASC) 5 MG tablet Take 7.5 mg by mouth daily. Give 1.5 tab to =7.5 mg once a day  . ferrous sulfate 325 (65 FE) MG tablet Take 325 mg by mouth daily with breakfast.   . linaclotide (LINZESS) 145 MCG CAPS capsule Take 145 mcg by mouth daily before breakfast.  . metoprolol (LOPRESSOR) 50 MG tablet Take 1 tablet (50 mg total) by mouth 2 (two) times daily.  . ondansetron (ZOFRAN) 4 MG tablet Take 4 mg by mouth 2 (two) times daily.  Vladimir Faster Glycol-Propyl Glycol (SYSTANE) 0.4-0.3 % GEL ophthalmic gel Place 1 application into both eyes 2 (two) times daily at 10 AM and 5 PM.  . [  DISCONTINUED] Nutritional Supplements (ENSURE ENLIVE PO) Give once day   No facility-administered encounter medications on file as of 07/16/2018.    Review of systems.  This is somewhat limited secondary to dementia.  General she not complaining of any fever chills or pain.  Skin is not complaining of any itching or rashes she does have a lump on her lower back this has enlarged since her initial exam but has been stable here recently.  Head ears eyes nose mouth and throat is not complaining of any sore throat or visual changes.  Respiratory is not complaining having  cough or shortness of breath.  Cardiac does not complain of chest pain.  GI no complaints of abdominal pain no nausea vomiting diarrhea constipation complaints noted by nursing staff.  Musculoskeletal at times will complain of leg and knee pain but is not complaining of that this evening.  Neurologic does not complain of dizziness or headache.  And psych she is pleasant appropriate does follow simple verbal commands I would say her dementia is moderate.  She appears to be in good spirits Physical exam.  She is afebrile pulse is 72 respirations 16.  In general this is a pleasant elderly female appears to be at her baseline no sign of distress.  Her skin is warm and dry.   Chest is clear to auscultation there is no labored breathing.  Heart is regular rate and rhythm without murmur gallop or rub.  Abdomen is soft nontender with positive bowel sounds.  Musculoskeletal moves her extremities at baseline ambulates in a wheelchair at baseline strength appears to be intact with baseline arthritic changes of her lower extremities- the lump on her lower mid back appears to be baseline with previous exam I would say about the size of a small apple-there is no surrounding erythema or tenderness or drainage or sign of infection.  Neurologic she is alert moves her extremities at baseline.  Psych she is oriented to self is pleasant can follow simple verbal commands and carry on a short straightforward conversation Labs.  June 10, 2018.  WBC 6 hemoglobin 11.4 platelets 300,000. Sodium 136 potassium 4.5 creatinine 1.75 BUN 39-liver function test within normal limits except total bilirubin minimally low at 0.2.  Assessment and plan.  1.  X-ray results as noted above showing possible irregularity L1 vertebral body- again per discussion with patient's responsible party at this time no aggressive work-up is desired --she continues to be comfortable without pain complaints.  Continue to  monitor.  [id number]

## 2018-08-14 ENCOUNTER — Non-Acute Institutional Stay (SKILLED_NURSING_FACILITY): Payer: Medicare HMO | Admitting: Internal Medicine

## 2018-08-14 ENCOUNTER — Encounter: Payer: Self-pay | Admitting: Internal Medicine

## 2018-08-14 DIAGNOSIS — D509 Iron deficiency anemia, unspecified: Secondary | ICD-10-CM | POA: Diagnosis not present

## 2018-08-14 DIAGNOSIS — F0390 Unspecified dementia without behavioral disturbance: Secondary | ICD-10-CM | POA: Diagnosis not present

## 2018-08-14 DIAGNOSIS — I10 Essential (primary) hypertension: Secondary | ICD-10-CM

## 2018-08-14 DIAGNOSIS — N184 Chronic kidney disease, stage 4 (severe): Secondary | ICD-10-CM | POA: Diagnosis not present

## 2018-08-14 DIAGNOSIS — M170 Bilateral primary osteoarthritis of knee: Secondary | ICD-10-CM

## 2018-08-14 NOTE — Progress Notes (Signed)
Location:    [facility] Room Number: 113/D Place of Service:  SNF [phone]) Provider:  Murray Hodgkins, MD  Patient Care Team: Sinda Du, MD as PCP - General (Pulmonary Disease) [name], MD as Consulting Physician (Gastroenterology)  Extended Emergency Contact Information Primary Emergency Contact: [name] of [place] Phone: [phone] Mobile Phone: [phone] Relation: Niece Secondary Emergency Contact: [place] of [place] Phone: [phone] Relation: Neighbor  Code Status:  Full Code Goals of care: Advanced Directive information Advanced Directives 08/14/2018  Does Patient Have a Medical Advance Directive? Yes  Type of Advance Directive (No Data)  Does patient want to make changes to medical advance directive? No - Patient declined  Copy of [place] in Chart? No - copy requested  Would patient like information on creating a medical advance directive? -     Chief Complaint  Patient presents with  . Medical Management of Chronic Issues    Routine visit of medical management  Medical management of chronic medical conditions including dementia- hypertension-anemia iron deficiency-chronic kidney disease stage IV- history of knee pain osteoarthritis with history of distal femoral fracture in the past- constipation-  HPI:  Pt is a 84 y.o. female seen today for medical management of chronic diseases.  As noted above.  She appears to be having a period of stability-she does not report really any discomfort today at times will complain of some knee pain.  She does have a order for Tylenol as needed which appears to be effective.  She also has a history of hypertension her Norvasc was recently increased up to 10 mg a day she is also on Lopressor 50 mg twice daily and this appears to be helping at times she still has some systolic elevations in the 140s  but baseline appears to be more in the 130s range per chart review.  She also has a history of anemia she is on iron hemoglobin has shown stability actually some improvement at 11.4 on lab done in October will recheck this.  She also has chronic kidney disease creatinine around 1.7 which is baseline will update this as well.  Regards to dementia- her weight appears to be relatively stable.  She is lost maybe a couple pounds in the past month this will have to be watched she does appear to do well with supportive care.    She also has a chronic back lump we did do an x-ray.showed mild to moderate degenerative changes in lumbar spine and grade 1 spondylolisthesis at L4-L5.    It also showed some increased density and sclerosis seen involving the L1 vertebral body and stated metastatic lesion could not be excluded recommend possible follow-up with a bone scan or MRI.   We did discuss this with her responsible party her niece- and at this point no aggressive follow-up is desired I suspect she would be a poor candidate for any aggressive intervention if anything significant was found  Currently she is sitting in his wheelchair comfortably vital signs appear to be stable her systolic is somewhat elevated today but this does not appear to be consistent    Past Medical History:  Diagnosis Date  . Anemia   . Arthritis   . History of blood transfusion    "today is the first time" (08/05/2014)  . Hypertension    Past Surgical History:  Procedure Laterality Date  . APPENDECTOMY    . CATARACT EXTRACTION  Left   . CHOLECYSTECTOMY    . COLONOSCOPY N/A 09/26/2014   Procedure: COLONOSCOPY;  Surgeon: [name], MD;  [facility];  Service: Endoscopy;  Laterality: N/A;  1030am  . CYSTECTOMY     "had cyst taken off lower back"  . ESOPHAGOGASTRODUODENOSCOPY N/A 08/22/2014   RWE:[id number] ring at the gastro junction/small HH  . ORIF FEMUR FRACTURE Left 08/07/2014   Procedure: OPEN  REDUCTION INTERNAL FIXATION (ORIF)  LEFT FEMUR FRACTURE;  Surgeon: [name], MD;  Location: [facility];  Service: Orthopedics;  Laterality: Left;  . TONSILLECTOMY      No Known Allergies  Outpatient Encounter Medications as of 08/14/2018  Medication Sig  . acetaminophen (TYLENOL) 325 MG tablet Take 650 mg by mouth every 6 (six) hours as needed.  Marland Kitchen amLODipine (NORVASC) 10 MG tablet Take 10 mg by mouth daily.  . ferrous sulfate 325 (65 FE) MG tablet Take 325 mg by mouth daily with breakfast.   . linaclotide (LINZESS) 145 MCG CAPS capsule Take 145 mcg by mouth daily before breakfast.  . metoprolol (LOPRESSOR) 50 MG tablet Take 1 tablet (50 mg total) by mouth 2 (two) times daily.  . ondansetron (ZOFRAN) 4 MG tablet Take 4 mg by mouth 2 (two) times daily as needed.   Vladimir Faster Glycol-Propyl Glycol (SYSTANE) 0.4-0.3 % GEL ophthalmic gel Place 1 application into both eyes 2 (two) times daily at 10 AM and 5 PM.  . [DISCONTINUED] amLODipine (NORVASC) 5 MG tablet Take 7.5 mg by mouth daily. Give 1.5 tab to =7.5 mg once a day   No facility-administered encounter medications on file as of 08/14/2018.      Review of Systems   This is limited secondary to dementia.  General she not complaining of any fever or chills.  Skin does not complain of itching or rashes does have that lump on her back as noted above.  Head ears eyes nose mouth and throat not complain of visual changes sore throat or difficulty swallowing.  Respiratory does not complain of being short of breath or having a cough.  Cardiac is not complaining of chest pain.  GI is not complaining of abdominal pain nausea vomiting diarrhea or constipation.  GU is not complaining of dysuria.  Musculoskeletal complaining of joint pain at this time at times will complain of knee pain.  Neurologic does not complain of feeling dizzy or having a headache or feeling syncopal.  And psych does have dementia but appears to do well with  supportive care there have been no behaviors she does not appear to be overtly anxious or depressed  Immunization History  Administered Date(s) Administered  . Influenza-Unspecified 08/01/2017  . Pneumococcal Conjugate-13 08/01/2017  . Pneumococcal Polysaccharide-23 06/13/2016  . Tdap 08/05/2014, 05/18/2017   Pertinent  Health Maintenance Due  Topic Date Due  . INFLUENZA VACCINE  Completed  . DEXA SCAN  Completed  . PNA vac Low Risk Adult  Completed   Fall Risk  06/12/2018 06/05/2017  Falls in the past year? No Yes  Number falls in past yr: - 1  Injury with Fall? - Yes   Functional Status Survey:    Vitals:   08/14/18 1403  BP: (!) 148/56  Pulse: (!) 59  Resp: 20  Temp: 98.2 F (36.8 C)  TempSrc: Oral  SpO2: 94%  Weight: 107 lb 6.4 oz (48.7 kg)  Height: 4\' 11"  (1.499 m)  Of note blood pressures generally systolically in the 761P with occasional spikes but  not consistent Body mass index is 21.69 kg/m. Physical Exam   General this is a pleasant elderly female in no distress sitting comfortably in her wheelchair.  Her skin is warm and dry she does have that lump which appears similar in size to previous exam lower mid back it is not erythematous or tender it is soft.  Eyes visual acuity appears to be intact sclera and conjunctive are clear.  Oropharynx she does appear to have some green food residue on her tongue mucous membranes appear fairly moist  Chest is clear to auscultation there is no labored breathing.  Heart is regular rate and rhythm without murmur gallop or rub she does not really have significant lower extremity edema  Abdomen is soft nontender with positive bowel sounds.  Musculoskeletal does move all extremities x4 at baseline has arthritic changes most prominently of her knees but does ambulate quite well in the wheelchair   Neurologic is grossly intact her speech clear cannot really appreciate lateralizing findings  Psych she is oriented to self is  pleasant appropriate does follow simple verbal commands and can carry on very short conversations-  Labs reviewed: Recent Labs    04/12/18 0700 05/26/18 0310 06/10/18 2327  NA 137 139 136  K 4.5 4.5 4.5  CL 106 108 104  CO2 23 23 25   GLUCOSE 111* 91 143*  BUN 43* 37* 39*  CREATININE 1.89* 1.77* 1.75*  CALCIUM 9.4 9.0 8.9   Recent Labs    03/05/18 1452 05/26/18 0310 06/10/18 2327  AST 18 18 15   ALT 12 13 11   ALKPHOS 78 74 81  BILITOT 0.4 0.6 0.2*  PROT 7.8 6.7 7.2  ALBUMIN 4.0 3.7 3.8   Recent Labs    04/12/18 0700 05/26/18 0310 06/10/18 2327  WBC 6.4 7.6 6.0  NEUTROABS 3.8 4.4 3.6  HGB 10.5* 9.8* 11.4*  HCT 33.4* 31.1* 35.7*  MCV 84.8 84.3 84.4  PLT 300 293 300   Lab Results  Component Value Date   TSH 2.515 08/01/2017   No results found for: HGBA1C No results found for: CHOL, HDL, LDLCALC, LDLDIRECT, TRIG, CHOLHDL  Significant Diagnostic Results in last 30 days:  No results found.  Assessment/Plan  #1- history of dementia she appears to be doing well with supportive care at this point continue to monitor weight appears relatively stable but will have to be watched apparently she is eating and drinking fairly well this will have to be encouraged however.  2.  Hypertension she is now on Norvasc 10 mg a day which was recently increased as well as Lopressor 50 mg twice daily- at this point will monitor still occasionally has systolics in the 409B but this does not appear to be consistent and again with her history at times of falls would be hesitant to be real aggressive here.  3.  History of chronic kidney disease creatinine most recent lab was 1.7 which appears to be relatively stable will update this for updated values.  4.-  Anemia suspect there is an element of chronic disease in addition apparently to iron deficiency she is on iron hemoglobin of 11.4 in October actually showed some improvement will have this updated as well.  5.  History of  osteoarthritis with a history of left knee pain and as well as a distal femoral fracture in the past at this point pain appears to be controlled on Tylenol  6.-  History of constipation she is on Linzess and this appears to be well tolerated.  7.  History of baclofen as noted in discussion above no aggressive intervention for now this appears to be similar to presentation to the last couple previous exams without any increase in size there is no sign of pain or infection.  Again will update a CBC metabolic panel for updated values at this point appears to be comfortable without pain complaints- p.o. intake will have to be encouraged and will await updated labs  [phone]

## 2018-08-15 ENCOUNTER — Encounter (HOSPITAL_COMMUNITY)
Admission: RE | Admit: 2018-08-15 | Discharge: 2018-08-15 | Disposition: A | Discharge status: Home / Self Care | Payer: Medicare HMO | Source: Skilled Nursing Facility | Attending: Internal Medicine | Admitting: Internal Medicine

## 2018-08-15 DIAGNOSIS — M159 Polyosteoarthritis, unspecified: Secondary | ICD-10-CM | POA: Diagnosis not present

## 2018-08-15 DIAGNOSIS — E876 Hypokalemia: Secondary | ICD-10-CM | POA: Diagnosis not present

## 2018-08-15 DIAGNOSIS — I129 Hypertensive chronic kidney disease with stage 1 through stage 4 chronic kidney disease, or unspecified chronic kidney disease: Secondary | ICD-10-CM | POA: Insufficient documentation

## 2018-08-15 LAB — BASIC METABOLIC PANEL
Anion gap: 7 (ref 5–15)
BUN: 31 mg/dL — AB (ref 8–23)
CHLORIDE: 109 mmol/L (ref 98–111)
CO2: 23 mmol/L (ref 22–32)
Calcium: 9.3 mg/dL (ref 8.9–10.3)
Creatinine, Ser: 1.78 mg/dL — ABNORMAL HIGH (ref 0.44–1.00)
GFR calc Af Amer: 28 mL/min — ABNORMAL LOW (ref >60)
GFR calc non Af Amer: 24 mL/min — ABNORMAL LOW (ref >60)
Glucose, Bld: 101 mg/dL — ABNORMAL HIGH (ref 70–99)
Potassium: 4.4 mmol/L (ref 3.5–5.1)
Sodium: 139 mmol/L (ref 135–145)

## 2018-08-15 LAB — CBC WITH DIFFERENTIAL/PLATELET
ABS IMMATURE GRANULOCYTES: 0.05 10*3/uL (ref 0.00–0.07)
BASOS ABS: 0 10*3/uL (ref 0.0–0.1)
BASOS PCT: 1 %
Eosinophils Absolute: 0.2 10*3/uL (ref 0.0–0.5)
Eosinophils Relative: 3 %
HCT: 35.3 % — ABNORMAL LOW (ref 36.0–46.0)
HEMOGLOBIN: 10.6 g/dL — AB (ref 12.0–15.0)
Immature Granulocytes: 1 %
LYMPHS PCT: 29 %
Lymphs Abs: 1.7 10*3/uL (ref 0.7–4.0)
MCH: 25.8 pg — ABNORMAL LOW (ref 26.0–34.0)
MCHC: 30 g/dL (ref 30.0–36.0)
MCV: 85.9 fL (ref 80.0–100.0)
Monocytes Absolute: 0.7 10*3/uL (ref 0.1–1.0)
Monocytes Relative: 12 %
NEUTROS ABS: 3.3 10*3/uL (ref 1.7–7.7)
Neutrophils Relative %: 54 %
Platelets: 349 10*3/uL (ref 150–400)
RBC: 4.11 MIL/uL (ref 3.87–5.11)
RDW: 13.3 % (ref 11.5–15.5)
WBC: 6 10*3/uL (ref 4.0–10.5)
nRBC: 0 % (ref 0.0–0.2)

## 2018-10-10 ENCOUNTER — Non-Acute Institutional Stay (SKILLED_NURSING_FACILITY): Payer: Medicare HMO | Admitting: Internal Medicine

## 2018-10-10 ENCOUNTER — Encounter: Payer: Self-pay | Admitting: Internal Medicine

## 2018-10-10 DIAGNOSIS — M170 Bilateral primary osteoarthritis of knee: Secondary | ICD-10-CM | POA: Diagnosis not present

## 2018-10-10 DIAGNOSIS — I10 Essential (primary) hypertension: Secondary | ICD-10-CM

## 2018-10-10 DIAGNOSIS — D509 Iron deficiency anemia, unspecified: Secondary | ICD-10-CM | POA: Diagnosis not present

## 2018-10-10 DIAGNOSIS — N184 Chronic kidney disease, stage 4 (severe): Secondary | ICD-10-CM | POA: Diagnosis not present

## 2018-10-10 DIAGNOSIS — F0390 Unspecified dementia without behavioral disturbance: Secondary | ICD-10-CM

## 2018-10-10 NOTE — Progress Notes (Signed)
Location:    [facility] Room Number: 113/D Place of Service:  SNF [phone]) Provider:  Murray Hodgkins, MD  Patient Care Team: Sinda Du, MD as PCP - General (Pulmonary Disease) [name], MD as Consulting Physician (Gastroenterology)  Extended Emergency Contact Information Primary Emergency Contact: [name] of [place] Phone: [phone] Mobile Phone: [phone] Relation: Niece Secondary Emergency Contact: [place] of [place] Phone: [phone] Relation: Neighbor  Code Status:  Full Code Goals of care: Advanced Directive information Advanced Directives 10/10/2018  Does Patient Have a Medical Advance Directive? Yes  Type of Advance Directive (No Data)  Does patient want to make changes to medical advance directive? No - Patient declined  Copy of [place] in Chart? No - copy requested  Would patient like information on creating a medical advance directive? -     Chief Complaint  Patient presents with  . Medical Management of Chronic Issues    Routine visit of medical management   Medical management of chronic medical conditions including dementia- hypertension-iron deficiency anemia-chronic kidney disease as well as osteoarthritic knee pain and constipation HPI:  Pt is a 85 y.o. female seen today for medical management of chronic diseases as noted above she continues to have an extended period of stability.  In regards to dementia she is on supplementation with Magic cup weight appears to have stabilized at around 105 pounds she had some mild weight loss late last year but this appears to have stabilized staff says she has a fairly decent appetite.  She also has a history of hypertension she is on Lopressor 50 mg twice daily and Norvasc 10 mg a day one-point blood pressures were somewhat elevated but this appears to have stabilized as well with recent  systolics ranging from the lower 100s to the 130s usually.  She also has a history of anemia with an element of iron deficiency hemoglobin continues to show relative stability at 10.6 on lab done in December will monitor this periodically she is on iron.  Regards to chronic kidney disease creatinine remained stable at 1.78-.  He also has a chronic back lump we did an x-ray at one point which showed mild to moderate degenerative changes in the lumbar spine grade 1 spondylolisthesis at L4-L5-also showed some increased density and sclerosis involving the L1 vertebral body stated metastatic lesion could not be excluded remake and possible follow-up with a bone scan or MRI-  This was discussed with her niece at this point no aggressive follow-up desired secondary to her being a poor candidate for any aggressive intervention with her advanced age and comorbidities.  The lump Remains at baseline not erythematous nontender.  Currently patient has no complaints at times she will complain of knee pain she does have Tylenol she states this is effective.     Past Medical History:  Diagnosis Date  . Anemia   . Arthritis   . History of blood transfusion    "today is the first time" (08/05/2014)  . Hypertension    Past Surgical History:  Procedure Laterality Date  . APPENDECTOMY    . CATARACT EXTRACTION Left   . CHOLECYSTECTOMY    . COLONOSCOPY N/A 09/26/2014   Procedure: COLONOSCOPY;  Surgeon: [name], MD;  [facility];  Service: Endoscopy;  Laterality: N/A;  1030am  . CYSTECTOMY     "had cyst taken off lower back"  . ESOPHAGOGASTRODUODENOSCOPY N/A 08/22/2014  [id number] ring at the gastro junction/small HH  . ORIF FEMUR FRACTURE Left 08/07/2014   Procedure: OPEN REDUCTION INTERNAL FIXATION (ORIF)  LEFT FEMUR FRACTURE;  Surgeon: [name], MD;  Location: [facility];  Service: Orthopedics;  Laterality: Left;  . TONSILLECTOMY      No Known Allergies  Outpatient  Encounter Medications as of 10/10/2018  Medication Sig  . acetaminophen (TYLENOL) 325 MG tablet Take 650 mg by mouth every 6 (six) hours as needed.  Marland Kitchen amLODipine (NORVASC) 10 MG tablet Take 10 mg by mouth daily.  . ferrous sulfate 325 (65 FE) MG tablet Take 325 mg by mouth daily with breakfast.   . linaclotide (LINZESS) 145 MCG CAPS capsule Take 145 mcg by mouth daily before breakfast.  . metoprolol (LOPRESSOR) 50 MG tablet Take 1 tablet (50 mg total) by mouth 2 (two) times daily.  . ondansetron (ZOFRAN) 4 MG tablet Take 4 mg by mouth 2 (two) times daily as needed.   Vladimir Faster Glycol-Propyl Glycol (SYSTANE) 0.4-0.3 % GEL ophthalmic gel Place 1 application into both eyes 2 (two) times daily at 10 AM and 5 PM.   No facility-administered encounter medications on file as of 10/10/2018.      Review of Systems --limited secondary to dementia  In general she is not complaining of any fever chills weight is relatively stable 105 pounds.  Skin is not complain of rashes or itching.  She has a chronic lump right lower back which remains unchanged nontender.  Head ears eyes nose mouth and throat is not complain of visual changes or sore throat.  Respiratory denies shortness of breath or cough.  Cardiac does not complain of chest pain or edema.  GU is not complaining of dysuria.  GI does not complain of abdominal pain at times had complained of this in the past but this is been sometime ago-is not complaining of nausea vomiting diarrhea or constipation.  Musculoskeletal at times will complain of knee pain as noted above otherwise no complaints.  Neurologic does not complain of dizziness headache numbness or syncope.  And psych does not complain of being depressed or anxious she does have dementia but this appears to be quite stable without behaviors  Immunization History  Administered Date(s) Administered  . Influenza-Unspecified 08/01/2017  . Pneumococcal Conjugate-13 08/01/2017  .  Pneumococcal Polysaccharide-23 06/13/2016  . Tdap 08/05/2014, 05/18/2017   Pertinent  Health Maintenance Due  Topic Date Due  . INFLUENZA VACCINE  Completed  . DEXA SCAN  Completed  . PNA vac Low Risk Adult  Completed   Fall Risk  06/12/2018 06/05/2017  Falls in the past year? No Yes  Number falls in past yr: - 1  Injury with Fall? - Yes   Functional Status Survey:    Vitals:   10/10/18 1046  BP: 121/62  Pulse: 60  Temp: (!) 97.4 F (36.3 C)  TempSrc: Oral   Weight is 105 pounds Physical Exam  I General this is a pleasant elderly female no distress sitting comfortably in her wheelchair.  Her skin is warm and dry she does have a chronic fairly large lump lower back this is nonerythematous nontender appears similar in size to previous exam really see any changes.  Eyes visual acuity appears to be intact sclera and conjunctive are clear.   Oropharynx is Clear mucous membranes moist tongue is midline.  Chest is clear to auscultation there is no labored breathing.  Heart is regular rate and rhythm without murmur gallop or rub she  does not appear to have significant lower extremity edema.  Abdomen is soft nontender with positive bowel sounds.  Musculoskeletal does ambulate well in a wheelchair moves all extremities x4 with baseline strength has arthritic changes of her knees but I do not really see significant edema or swelling of her knees or erythema actually not much tenderness to palpation today either.  Neurologic is grossly intact her speech is clear without lateralizing findings she does not speak a whole lot however.  Psych she is oriented to self she is pleasant can carry on a very limited conversation appears to be at baseline  Labs reviewed: Recent Labs    05/26/18 0310 06/10/18 2327 08/15/18 0729  NA 139 136 139  K 4.5 4.5 4.4  CL 108 104 109  CO2 23 25 23   GLUCOSE 91 143* 101*  BUN 37* 39* 31*  CREATININE 1.77* 1.75* 1.78*  CALCIUM 9.0 8.9 9.3    Recent Labs    03/05/18 1452 05/26/18 0310 06/10/18 2327  AST 18 18 15   ALT 12 13 11   ALKPHOS 78 74 81  BILITOT 0.4 0.6 0.2*  PROT 7.8 6.7 7.2  ALBUMIN 4.0 3.7 3.8   Recent Labs    05/26/18 0310 06/10/18 2327 08/15/18 0729  WBC 7.6 6.0 6.0  NEUTROABS 4.4 3.6 3.3  HGB 9.8* 11.4* 10.6*  HCT 31.1* 35.7* 35.3*  MCV 84.3 84.4 85.9  PLT 293 300 349   Lab Results  Component Value Date   TSH 2.515 08/01/2017   No results found for: HGBA1C No results found for: CHOL, HDL, LDLCALC, LDLDIRECT, TRIG, CHOLHDL  Significant Diagnostic Results in last 30 days:  No results found.  Assessment/Plan  #1 history of dementia this appears stable her weight appears to have stabilized she is on Magic cup supplementation continue supportive care.  2.  Hypertension appears stable on Lopressor and Norvasc at current doses- as noted above.  3.  Anemia with iron deficiency and also suspect an element of chronicity with her renal function- she is on iron hemoglobin shows relative stability at 10.6 will monitor periodically.  4.  History of chronic kidney disease this is been stable with a creatinine of 1.78 on December lab again will continue to monitor periodically.  5.  History of osteoarthritis with knee pain this appears to be controlled with Tylenol at times will complain of knee pain but apparently Tylenol is effective.  Of note she also has a distal femoral fracture history.  6.  History of constipation she is on Linzess and apparently this has not really been an issue.  7.  History of chronic back lump again this appears similar presentation over the last several exams at this point will monitor.  [id number]

## 2018-10-11 DIAGNOSIS — L603 Nail dystrophy: Secondary | ICD-10-CM | POA: Diagnosis not present

## 2018-10-11 DIAGNOSIS — I739 Peripheral vascular disease, unspecified: Secondary | ICD-10-CM | POA: Diagnosis not present

## 2018-11-07 ENCOUNTER — Non-Acute Institutional Stay (SKILLED_NURSING_FACILITY): Payer: Medicare HMO | Admitting: Adult Health

## 2018-11-07 ENCOUNTER — Encounter: Payer: Self-pay | Admitting: Adult Health

## 2018-11-07 DIAGNOSIS — S72302S Unspecified fracture of shaft of left femur, sequela: Secondary | ICD-10-CM | POA: Diagnosis not present

## 2018-11-07 DIAGNOSIS — F0390 Unspecified dementia without behavioral disturbance: Secondary | ICD-10-CM | POA: Diagnosis not present

## 2018-11-07 DIAGNOSIS — K5904 Chronic idiopathic constipation: Secondary | ICD-10-CM | POA: Diagnosis not present

## 2018-11-07 DIAGNOSIS — I129 Hypertensive chronic kidney disease with stage 1 through stage 4 chronic kidney disease, or unspecified chronic kidney disease: Secondary | ICD-10-CM

## 2018-11-07 DIAGNOSIS — N184 Chronic kidney disease, stage 4 (severe): Secondary | ICD-10-CM

## 2018-11-07 DIAGNOSIS — C189 Malignant neoplasm of colon, unspecified: Secondary | ICD-10-CM

## 2018-11-07 DIAGNOSIS — D509 Iron deficiency anemia, unspecified: Secondary | ICD-10-CM

## 2018-11-07 NOTE — Progress Notes (Signed)
Provider:  Ok Edwards, NP Location:  [place] Room Number: 79 D Place of Service:  SNF (31)   PCP: Alicia Du, MD Patient Care Team: Alicia Du, MD as PCP - General (Pulmonary Disease) Alicia Binder, MD as Consulting Physician (Gastroenterology)  Extended Emergency Contact Information Primary Emergency Contact: Alicia Guerra [place] of [place] Phone: [phone] Mobile Phone: [phone] Relation: Niece Secondary Emergency Contact: Alicia Guerra of [place] Phone: [phone] Relation: Neighbor  Code Status: Full Code Goals of Care: Advanced Directive information Advanced Directives 11/07/2018  Does Patient Have a Medical Advance Directive? No  Type of Advance Directive -  Does patient want to make changes to medical advance directive? No - Patient declined  Copy of [place] in Chart? -  Would patient like information on creating a medical advance directive? No - Patient declined      No Known Allergies   Chief Complaint  Patient presents with  . Annual Exam        HPI: Patient is a 85 y.o. female seen today for an annual comprehensive examination. She has not required any hospitalizations over the past year. She has not had nay significant weight changes. There are reports of uncontrolled pain; reports of constipation or diarrhea; no reports of agitation or anxiety. She continue to be followed for her chronic illnesses including: colon cancer; left femur fracture; dementia.   Past Medical History:  Diagnosis Date  . Anemia   . Arthritis   . History of blood transfusion    "today is the first time" (08/05/2014)  . Hypertension    Past Surgical History:  Procedure Laterality Date  . APPENDECTOMY    . CATARACT EXTRACTION Left   . CHOLECYSTECTOMY    . COLONOSCOPY N/A 09/26/2014   Procedure: COLONOSCOPY;  Surgeon: Alicia Binder, MD;  [facility];   Service: Endoscopy;  Laterality: N/A;  1030am  . CYSTECTOMY     "had cyst taken off lower back"  . ESOPHAGOGASTRODUODENOSCOPY N/A 08/22/2014   NOB:SJGGEZMO ring at the gastro junction/small HH  . ORIF FEMUR FRACTURE Left 08/07/2014   Procedure: OPEN REDUCTION INTERNAL FIXATION (ORIF)  LEFT FEMUR FRACTURE;  Surgeon: Alicia Butters, MD;  Location: [facility];  Service: Orthopedics;  Laterality: Left;  . TONSILLECTOMY      reports that she has quit smoking. Her smoking use included cigarettes. She has a 4.00 pack-year smoking history. She has never used smokeless tobacco. She reports that she does not drink alcohol or use drugs. Social History   Socioeconomic History  . Marital status: Widowed    Spouse name: Not on file  . Number of children: Not on file  . Years of education: Not on file  . Highest education level: Not on file  Occupational History  . Not on file  Social Needs  . Financial resource strain: Not hard at all  . Food insecurity:    Worry: Never true    Inability: Never true  . Transportation needs:    Medical: No    Non-medical: No  Tobacco Use  . Smoking status: Former Smoker    Packs/day: 0.25    Years: 16.00    Pack years: 4.00    Types: Cigarettes  . Smokeless tobacco: Never Used  Substance and Sexual Activity  . Alcohol use: No  . Drug use: No  . Sexual activity: Never  Lifestyle  . Physical activity:    Days per  week: 0 days    Minutes per session: 0 min  . Stress: Not at all  Relationships  . Social connections:    Talks on phone: Never    Gets together: Once a week    Attends religious service: Never    Active member of club or organization: No    Attends meetings of clubs or organizations: Never    Relationship status: Widowed  . Intimate partner violence:    Fear of current or ex partner: No    Emotionally abused: No    Physically abused: No    Forced sexual activity: No  Other Topics Concern  . Not on file  Social History Narrative  . Not  on file   History reviewed. No pertinent family history.  Vitals:   11/07/18 1325  BP: (!) 133/59  Pulse: 62  Resp: 18  Temp: (!) 97.2 F (36.2 C)  Weight: 102 lb (46.3 kg)  Height: 4\' 11"  (1.499 m)   Body mass index is 20.6 kg/m.  Outpatient Encounter Medications as of 11/07/2018  Medication Sig  . acetaminophen (TYLENOL) 325 MG tablet Take 650 mg by mouth every 6 (six) hours as needed.  Marland Kitchen amLODipine (NORVASC) 10 MG tablet Take 10 mg by mouth daily.  . ferrous sulfate 325 (65 FE) MG tablet Take 325 mg by mouth daily with breakfast.   . linaclotide (LINZESS) 145 MCG CAPS capsule Take 145 mcg by mouth daily before breakfast.  . metoprolol (LOPRESSOR) 50 MG tablet Take 1 tablet (50 mg total) by mouth 2 (two) times daily.  . NON FORMULARY Diet Type:  Regular, continue with thin liquids  . ondansetron (ZOFRAN) 4 MG tablet Take 4 mg by mouth 2 (two) times daily as needed.   Alicia Guerra Glycol-Propyl Glycol (SYSTANE) 0.4-0.3 % GEL ophthalmic gel Place 1 application into both eyes 2 (two) times daily at 10 AM and 5 PM.   No facility-administered encounter medications on file as of 11/07/2018.      SIGNIFICANT DIAGNOSTIC EXAMS  LABS REVIEWED TODAY;   06-10-18: wbc 6.0; hgb 11.4; hct 35.7; mcv 84.4 plt 300; glucose 143; bun 39; creat 1.75; k+ 4.5;na++ 136; ca 8.9 liver normal albumi 3.8  08-15-18: wbc 6.0; hgb 10.6; hct 35.3;mcv 85.;9 plt 349; glucose 101; bun 31; creat 1.78; k+ 4.4; na++ 139; ca 9.3   Review of Systems  Unable to perform ROS: Dementia (unable to fully participate )    Physical Exam Constitutional:      General: She is not in acute distress.    Appearance: She is well-developed. She is not diaphoretic.     Comments: Frail   Eyes:     Comments: History of left cataract removal   Neck:     Musculoskeletal: Neck supple.     Thyroid: No thyromegaly.  Cardiovascular:     Rate and Rhythm: Normal rate and regular rhythm.     Pulses: Normal pulses.     Heart sounds:  Normal heart sounds.  Pulmonary:     Effort: Pulmonary effort is normal. No respiratory distress.     Breath sounds: Normal breath sounds.  Abdominal:     General: Bowel sounds are normal. There is no distension.     Palpations: Abdomen is soft.     Tenderness: There is no abdominal tenderness.  Musculoskeletal:     Right lower leg: No edema.     Left lower leg: No edema.     Comments: Is able to move all extremities  Uses wheelchair  History of left femur ORIF  Lymphadenopathy:     Cervical: No cervical adenopathy.  Skin:    General: Skin is warm and dry.  Neurological:     Mental Status: She is alert. Mental status is at baseline.  Psychiatric:        Mood and Affect: Mood normal.       Assessment/Plan  TODAY:   1. Essential hypertension: is stable b/p 133/59: will continue norvasc 10 mg daily and lopressor 50 mg twice daily   2. Colon cancer: is without change in status will monitor  3.  Left femoral shaft fracture: history of left femur ORIF: is stable has tylenol as needed for pain will monitor   4. Unspecified type dementia without behavioral disturbance: is without change weight is 102 pounds will monitor   5. Chronic idiopathic constipation: is stable will continue linzess 145 mcg daily   6. Benign hypertension with CKD (chronic kidney disease) stage IV: is stable bun 31; creat 1.78; will monitor   7. Unspecified iron deficiency anemia: is stable hgb 10.6 will continue iron daily    Her health maintenance is up to date.     MD is aware of resident's narcotic use and is in agreement with current plan of care. We will wean dosage as appropriate for resident   Alicia Edwards NP [facility]  Contact [phone] Monday through Friday 8am- 5pm  After hours call [phone]

## 2018-11-13 DIAGNOSIS — K5904 Chronic idiopathic constipation: Secondary | ICD-10-CM | POA: Insufficient documentation

## 2018-11-13 DIAGNOSIS — N184 Chronic kidney disease, stage 4 (severe): Secondary | ICD-10-CM

## 2018-11-13 DIAGNOSIS — I129 Hypertensive chronic kidney disease with stage 1 through stage 4 chronic kidney disease, or unspecified chronic kidney disease: Secondary | ICD-10-CM | POA: Insufficient documentation

## 2018-12-04 ENCOUNTER — Non-Acute Institutional Stay (SKILLED_NURSING_FACILITY): Payer: Medicare HMO | Admitting: Internal Medicine

## 2018-12-04 ENCOUNTER — Encounter: Payer: Self-pay | Admitting: Internal Medicine

## 2018-12-04 DIAGNOSIS — F0390 Unspecified dementia without behavioral disturbance: Secondary | ICD-10-CM | POA: Diagnosis not present

## 2018-12-04 DIAGNOSIS — K5904 Chronic idiopathic constipation: Secondary | ICD-10-CM

## 2018-12-04 DIAGNOSIS — I10 Essential (primary) hypertension: Secondary | ICD-10-CM | POA: Diagnosis not present

## 2018-12-04 DIAGNOSIS — D509 Iron deficiency anemia, unspecified: Secondary | ICD-10-CM

## 2018-12-04 DIAGNOSIS — I129 Hypertensive chronic kidney disease with stage 1 through stage 4 chronic kidney disease, or unspecified chronic kidney disease: Secondary | ICD-10-CM | POA: Diagnosis not present

## 2018-12-04 DIAGNOSIS — N184 Chronic kidney disease, stage 4 (severe): Secondary | ICD-10-CM

## 2018-12-04 NOTE — Progress Notes (Signed)
Location:    [place] Room Number: 113/D Place of Service:  SNF [phone]) Provider: Veleta Miners MD  Sinda Du, MD  Patient Care Team: Sinda Du, MD as PCP - General (Pulmonary Disease) [name], MD as Consulting Physician (Gastroenterology)  Extended Emergency Contact Information Primary Emergency Contact: [name] States of [place] Phone: [phone] Mobile Phone: [phone] Relation: Niece Secondary Emergency Contact: [place] of [place] Phone: [phone] Relation: Neighbor  Code Status:  DNR Goals of care: Advanced Directive information Advanced Directives 12/04/2018  Does Patient Have a Medical Advance Directive? Yes  Type of Advance Directive Out of facility DNR (pink MOST or yellow form)  Does patient want to make changes to medical advance directive? No - Patient declined  Copy of [place] in Chart? No - copy requested  Would patient like information on creating a medical advance directive? No - Patient declined     Chief Complaint  Patient presents with  . Medical Management of Chronic Issues    Routine visit of medical management    HPI:  Pt is a 85 y.o. female seen today for medical management of chronic diseases.    Patient has h/o Hypertension, Colorectal cancer s/p Right Colectomy, Anemia, Chronic renal disease.Stage 4, H/o Left Knee and Distal femoral Fracture. She is long term resident of facility Patient does not have any acute issues She has good appetite but is slowly lost almost 5 lbs in past few months No New Nursing issues She stays Fall Risk Past Medical History:  Diagnosis Date  . Anemia   . Arthritis   . History of blood transfusion    "today is the first time" (08/05/2014)  . Hypertension    Past Surgical History:  Procedure Laterality Date  . APPENDECTOMY    . CATARACT EXTRACTION Left   . CHOLECYSTECTOMY    . COLONOSCOPY  N/A 09/26/2014   Procedure: COLONOSCOPY;  Surgeon: [name], MD;  [facility];  Service: Endoscopy;  Laterality: N/A;  1030am  . CYSTECTOMY     "had cyst taken off lower back"  . ESOPHAGOGASTRODUODENOSCOPY N/A 08/22/2014   LOV:FIEPPIRJ ring at the gastro junction/small HH  . ORIF FEMUR FRACTURE Left 08/07/2014   Procedure: OPEN REDUCTION INTERNAL FIXATION (ORIF)  LEFT FEMUR FRACTURE;  Surgeon: [name], MD;  Location: [facility];  Service: Orthopedics;  Laterality: Left;  . TONSILLECTOMY      No Known Allergies  Outpatient Encounter Medications as of 12/04/2018  Medication Sig  . acetaminophen (TYLENOL) 325 MG tablet Take 650 mg by mouth every 6 (six) hours as needed.  Marland Kitchen amLODipine (NORVASC) 10 MG tablet Take 10 mg by mouth daily.  . ferrous sulfate 325 (65 FE) MG tablet Take 325 mg by mouth daily with breakfast.   . linaclotide (LINZESS) 145 MCG CAPS capsule Take 145 mcg by mouth daily before breakfast.  . metoprolol (LOPRESSOR) 50 MG tablet Take 1 tablet (50 mg total) by mouth 2 (two) times daily.  . NON FORMULARY Diet Type:  Regular, continue with thin liquids  . ondansetron (ZOFRAN) 4 MG tablet Take 4 mg by mouth 2 (two) times daily as needed.   Vladimir Faster Glycol-Propyl Glycol (SYSTANE) 0.4-0.3 % GEL ophthalmic gel Place 1 application into both eyes 2 (two) times daily at 10 AM and 5 PM.   No facility-administered encounter medications on file as of 12/04/2018.      Review of Systems  Constitutional: Negative.   HENT: Negative.   Respiratory: Negative.   Cardiovascular: Negative.   Gastrointestinal: Positive for abdominal pain.  Genitourinary: Negative.   Musculoskeletal: Positive for arthralgias and back pain.  Neurological: Negative.   Psychiatric/Behavioral: Negative.     Immunization History  Administered Date(s) Administered  . Influenza-Unspecified 06/09/2016, 08/01/2017, 06/07/2018  . Pneumococcal Conjugate-13 08/01/2017  . Pneumococcal  Polysaccharide-23 06/13/2016  . Tdap 08/05/2014, 05/18/2017   Pertinent  Health Maintenance Due  Topic Date Due  . INFLUENZA VACCINE  Completed  . DEXA SCAN  Completed  . PNA vac Low Risk Adult  Completed   Fall Risk  06/12/2018 06/05/2017  Falls in the past year? No Yes  Number falls in past yr: - 1  Injury with Fall? - Yes   Functional Status Survey:    Vitals:   12/04/18 0821  BP: 135/74  Pulse: (!) 56  Resp: 18  Temp: 98.1 F (36.7 C)  TempSrc: Oral  SpO2: 94%  Weight: 102 lb (46.3 kg)  Height: 4\' 11"  (1.499 m)   Body mass index is 20.6 kg/m. Physical Exam Constitutional:      Appearance: She is well-developed.  HENT:     Head: Normocephalic.  Eyes:     Pupils: Pupils are equal, round, and reactive to light.  Neck:     Musculoskeletal: Neck supple.  Cardiovascular:     Rate and Rhythm: Normal rate and regular rhythm.  Pulmonary:     Effort: Pulmonary effort is normal.     Breath sounds: Normal breath sounds.  Abdominal:     General: Bowel sounds are normal. There is no distension.     Palpations: Abdomen is soft.     Tenderness: There is no abdominal tenderness. There is no guarding.  Skin:    General: Skin is warm and dry.  Neurological:     Mental Status: She is alert.     Comments: Not Oriented. Did not know the Name of the Place or her DOB. She is mostly Wheelchair Bound. Walks with MOD assist  Psychiatric:        Behavior: Behavior normal.        Thought Content: Thought content normal.     Labs reviewed: Recent Labs    05/26/18 0310 06/10/18 2327 08/15/18 0729  NA 139 136 139  K 4.5 4.5 4.4  CL 108 104 109  CO2 23 25 23   GLUCOSE 91 143* 101*  BUN 37* 39* 31*  CREATININE 1.77* 1.75* 1.78*  CALCIUM 9.0 8.9 9.3   Recent Labs    03/05/18 1452 05/26/18 0310 06/10/18 2327  AST 18 18 15   ALT 12 13 11   ALKPHOS 78 74 81  BILITOT 0.4 0.6 0.2*  PROT 7.8 6.7 7.2  ALBUMIN 4.0 3.7 3.8   Recent Labs    05/26/18 0310 06/10/18 2327  08/15/18 0729  WBC 7.6 6.0 6.0  NEUTROABS 4.4 3.6 3.3  HGB 9.8* 11.4* 10.6*  HCT 31.1* 35.7* 35.3*  MCV 84.3 84.4 85.9  PLT 293 300 349   Lab Results  Component Value Date   TSH 2.515 08/01/2017   No results found for: HGBA1C No results found for: CHOL, HDL, LDLCALC, LDLDIRECT, TRIG, CHOLHDL  Significant Diagnostic Results in last 30 days:  No results found.  Assessment/Plan Essential hypertension Controlled on Norvasc and Metoprolol  Chronic kidney disease, stage4with Hyperkalemia Stable Creat Repeat BMP  Anemia Due to Chronic Renal disease  On Iron supplement. Hgb Stable  Will repeat CBC  Constipation  with Abdominal Discomfort Patient was started on Linzess and has Been doing well since then  Dementia Continue supportive care. Not on Any treatment.  Osteoporosis With Tscore of -2.6 with h/o Fracture .in CKD Stage4 GFR Less then 35 Trying to get approve for [place] Patient has lump in her Lower Back Nurses have d/w her POA and she said further Work up due to her Frailty and Dementia.  Have been trying to arrange the  Family Meeting with her POA for her ACP.But her Niece works and is unable to come. Social worker talked to her on Phone She does want Aunt to transfer to Hospital if needed    Family/ staff Communication:   Labs/tests ordered:  CMP and CBC Total time spent in this patient care encounter was  25_  minutes; greater than 50% of the visit spent counseling patient and staff, reviewing records , Labs and coordinating care for problems addressed at this encounter.

## 2018-12-18 ENCOUNTER — Encounter (HOSPITAL_COMMUNITY)
Admission: RE | Admit: 2018-12-18 | Discharge: 2018-12-18 | Disposition: A | Discharge status: Home / Self Care | Payer: Medicare HMO | Source: Skilled Nursing Facility | Attending: Internal Medicine | Admitting: Internal Medicine

## 2018-12-18 DIAGNOSIS — N189 Chronic kidney disease, unspecified: Secondary | ICD-10-CM | POA: Diagnosis not present

## 2018-12-18 DIAGNOSIS — K5900 Constipation, unspecified: Secondary | ICD-10-CM | POA: Diagnosis not present

## 2018-12-18 DIAGNOSIS — E876 Hypokalemia: Secondary | ICD-10-CM | POA: Insufficient documentation

## 2018-12-18 DIAGNOSIS — I129 Hypertensive chronic kidney disease with stage 1 through stage 4 chronic kidney disease, or unspecified chronic kidney disease: Secondary | ICD-10-CM | POA: Diagnosis not present

## 2018-12-18 LAB — COMPREHENSIVE METABOLIC PANEL
ALT: 10 U/L (ref 0–44)
AST: 15 U/L (ref 15–41)
Albumin: 4.3 g/dL (ref 3.5–5.0)
Alkaline Phosphatase: 89 U/L (ref 38–126)
Anion gap: 8 (ref 5–15)
BUN: 46 mg/dL — ABNORMAL HIGH (ref 8–23)
CO2: 20 mmol/L — ABNORMAL LOW (ref 22–32)
Calcium: 9.2 mg/dL (ref 8.9–10.3)
Chloride: 111 mmol/L (ref 98–111)
Creatinine, Ser: 1.83 mg/dL — ABNORMAL HIGH (ref 0.44–1.00)
GFR calc Af Amer: 27 mL/min — ABNORMAL LOW (ref >60)
GFR calc non Af Amer: 23 mL/min — ABNORMAL LOW (ref >60)
Glucose, Bld: 101 mg/dL — ABNORMAL HIGH (ref 70–99)
Potassium: 4.4 mmol/L (ref 3.5–5.1)
Sodium: 139 mmol/L (ref 135–145)
Total Bilirubin: 0.6 mg/dL (ref 0.3–1.2)
Total Protein: 7.4 g/dL (ref 6.5–8.1)

## 2018-12-18 LAB — CBC WITH DIFFERENTIAL/PLATELET
Abs Immature Granulocytes: 0.03 10*3/uL (ref 0.00–0.07)
Basophils Absolute: 0 10*3/uL (ref 0.0–0.1)
Basophils Relative: 1 %
Eosinophils Absolute: 0.2 10*3/uL (ref 0.0–0.5)
Eosinophils Relative: 3 %
HCT: 37.2 % (ref 36.0–46.0)
Hemoglobin: 11.3 g/dL — ABNORMAL LOW (ref 12.0–15.0)
Immature Granulocytes: 1 %
Lymphocytes Relative: 39 %
Lymphs Abs: 2.2 10*3/uL (ref 0.7–4.0)
MCH: 25.7 pg — ABNORMAL LOW (ref 26.0–34.0)
MCHC: 30.4 g/dL (ref 30.0–36.0)
MCV: 84.5 fL (ref 80.0–100.0)
Monocytes Absolute: 0.7 10*3/uL (ref 0.1–1.0)
Monocytes Relative: 12 %
Neutro Abs: 2.5 10*3/uL (ref 1.7–7.7)
Neutrophils Relative %: 44 %
Platelets: 317 10*3/uL (ref 150–400)
RBC: 4.4 MIL/uL (ref 3.87–5.11)
RDW: 13.9 % (ref 11.5–15.5)
WBC: 5.6 10*3/uL (ref 4.0–10.5)
nRBC: 0 % (ref 0.0–0.2)

## 2018-12-20 ENCOUNTER — Non-Acute Institutional Stay (SKILLED_NURSING_FACILITY): Payer: Medicare HMO | Admitting: Adult Health

## 2018-12-20 ENCOUNTER — Encounter: Payer: Self-pay | Admitting: Adult Health

## 2018-12-20 DIAGNOSIS — N184 Chronic kidney disease, stage 4 (severe): Secondary | ICD-10-CM

## 2018-12-20 DIAGNOSIS — D509 Iron deficiency anemia, unspecified: Secondary | ICD-10-CM | POA: Diagnosis not present

## 2018-12-20 DIAGNOSIS — I129 Hypertensive chronic kidney disease with stage 1 through stage 4 chronic kidney disease, or unspecified chronic kidney disease: Secondary | ICD-10-CM | POA: Diagnosis not present

## 2018-12-20 DIAGNOSIS — K5904 Chronic idiopathic constipation: Secondary | ICD-10-CM | POA: Diagnosis not present

## 2018-12-20 NOTE — Progress Notes (Signed)
Location:   [place] Room Number: 113 D Place of Service:  SNF (31)   CODE STATUS: DNR  No Known Allergies  Chief Complaint  Patient presents with  . Medical Management of Chronic Issues    Benign hypertension with CKD (chronic kidney disease) stage IV: chronic idiopathic constipation; iron deficiency unspecified iron deficiency type.     HPI:  She is a 85 year old long term resident of this facility being seen for the management of her chronic illnesses: hypertension; constipation; anemia. There are no reports of constipation; no uncontrolled pain; no reports of agitation. No reports of fevers.   Past Medical History:  Diagnosis Date  . Anemia   . Arthritis   . History of blood transfusion    "today is the first time" (08/05/2014)  . Hypertension     Past Surgical History:  Procedure Laterality Date  . APPENDECTOMY    . CATARACT EXTRACTION Left   . CHOLECYSTECTOMY    . COLONOSCOPY N/A 09/26/2014   Procedure: COLONOSCOPY;  Surgeon: [name], MD;  [facility];  Service: Endoscopy;  Laterality: N/A;  1030am  . CYSTECTOMY     "had cyst taken off lower back"  . ESOPHAGOGASTRODUODENOSCOPY N/A 08/22/2014   [facility]:QPRFFMBW ring at the gastro junction/small HH  . ORIF FEMUR FRACTURE Left 08/07/2014   Procedure: OPEN REDUCTION INTERNAL FIXATION (ORIF)  LEFT FEMUR FRACTURE;  Surgeon: [name], MD;  Location: [place];  Service: Orthopedics;  Laterality: Left;  . TONSILLECTOMY      Social History   Socioeconomic History  . Marital status: Widowed    Spouse name: Not on file  . Number of children: Not on file  . Years of education: Not on file  . Highest education level: Not on file  Occupational History  . Not on file  Social Needs  . Financial resource strain: Not hard at all  . Food insecurity:    Worry: Never true    Inability: Never true  . Transportation needs:    Medical: No    Non-medical: No  Tobacco Use  .  Smoking status: Former Smoker    Packs/day: 0.25    Years: 16.00    Pack years: 4.00    Types: Cigarettes  . Smokeless tobacco: Never Used  Substance and Sexual Activity  . Alcohol use: No  . Drug use: No  . Sexual activity: Never  Lifestyle  . Physical activity:    Days per week: 0 days    Minutes per session: 0 min  . Stress: Not at all  Relationships  . Social connections:    Talks on phone: Never    Gets together: Once a week    Attends religious service: Never    Active member of club or organization: No    Attends meetings of clubs or organizations: Never    Relationship status: Widowed  . Intimate partner violence:    Fear of current or ex partner: No    Emotionally abused: No    Physically abused: No    Forced sexual activity: No  Other Topics Concern  . Not on file  Social History Narrative  . Not on file   History reviewed. No pertinent family history.    VITAL SIGNS BP (!) 106/49   Pulse (!) 55   Temp 97.6 F (36.4 C)   Resp 18   Ht 4\' 11"  (1.499 m)   Wt 101 lb 9.6 oz (46.1  kg)   BMI 20.52 kg/m   Outpatient Encounter Medications as of 12/20/2018  Medication Sig  . acetaminophen (TYLENOL) 325 MG tablet Take 650 mg by mouth every 6 (six) hours as needed.  Marland Kitchen amLODipine (NORVASC) 10 MG tablet Take 10 mg by mouth daily.  . ferrous sulfate 325 (65 FE) MG tablet Take 325 mg by mouth daily with breakfast.   . linaclotide (LINZESS) 145 MCG CAPS capsule Take 145 mcg by mouth daily before breakfast.  . metoprolol (LOPRESSOR) 50 MG tablet Take 1 tablet (50 mg total) by mouth 2 (two) times daily.  . NON FORMULARY Diet Type:  Regular, continue with thin liquids  . ondansetron (ZOFRAN) 4 MG tablet Take 4 mg by mouth 2 (two) times daily as needed.   Vladimir Faster Glycol-Propyl Glycol (SYSTANE) 0.4-0.3 % GEL ophthalmic gel Place 1 application into both eyes 2 (two) times daily at 10 AM and 5 PM.   No facility-administered encounter medications on file as of  12/20/2018.      SIGNIFICANT DIAGNOSTIC EXAMS  LABS REVIEWED PREVIOUS;   06-10-18: wbc 6.0; hgb 11.4; hct 35.7; mcv 84.4 plt 300; glucose 143; bun 39; creat 1.75; k+ 4.5;na++ 136; ca 8.9 liver normal albumi 3.8  08-15-18: wbc 6.0; hgb 10.6; hct 35.3;mcv 85.;9 plt 349; glucose 101; bun 31; creat 1.78; k+ 4.4; na++ 139; ca 9.3   TODAY:   12-18-18: wbc 5.6; hgb 11.3 hct 37.2; mcv 84.5; plt 317; glucose 101; bun 46; creat 1.83; k+ 4.4; na++ 139; ca 9.2 liver normal albumin 4.3    Review of Systems  Unable to perform ROS: Dementia (unable to to participate)   Physical Exam Constitutional:      General: She is not in acute distress.    Appearance: She is underweight. She is not diaphoretic.  Eyes:     Comments: History of left cataract removal   Neck:     Thyroid: No thyromegaly.  Cardiovascular:     Rate and Rhythm: Normal rate and regular rhythm.     Pulses: Normal pulses.     Heart sounds: Normal heart sounds.  Pulmonary:     Effort: Pulmonary effort is normal. No respiratory distress.     Breath sounds: Normal breath sounds.  Abdominal:     General: Bowel sounds are normal. There is no distension.     Palpations: Abdomen is soft.     Tenderness: There is no abdominal tenderness.  Musculoskeletal:     Right lower leg: No edema.     Left lower leg: No edema.     Comments:  Is able to move all extremities  Uses wheelchair  History of left femur ORIF   Lymphadenopathy:     Cervical: No cervical adenopathy.  Skin:    General: Skin is warm and dry.  Neurological:     Mental Status: She is alert. Mental status is at baseline.  Psychiatric:        Mood and Affect: Mood normal.      Assessment/Plan  TODAY:   1. Chronic idiopathic constipation: is stable will continue linzess 145 mcg daily   2. Benign hypertension with CKD (Chronic kidney disease) stage IV is stable bun 48; creat 1.83; will monitor   3. Unspecified iron deficiency anemia: is stable hgb 11.3 will  continue iron daiy  PREVIOUS  4. Essential hypertension: is stable b/p 106/49: will continue norvasc 10 mg daily and lopressor 50 mg twice daily   5. Colon cancer: is without change in  status will monitor  6.  Left femoral shaft fracture: history of left femur ORIF: is stable has tylenol as needed for pain will monitor   7. Unspecified type dementia without behavioral disturbance: is without change weight is 101 (previous 102) pounds will monitor       MD is aware of resident's narcotic use and is in agreement with current plan of care. We will attempt to wean resident as apropriate   Ok Edwards NP [facility]  Contact [phone] Monday through Friday 8am- 5pm  After hours call [phone]

## 2019-01-16 ENCOUNTER — Encounter: Payer: Self-pay | Admitting: Adult Health

## 2019-01-16 ENCOUNTER — Non-Acute Institutional Stay (SKILLED_NURSING_FACILITY): Payer: Medicare HMO | Admitting: Adult Health

## 2019-01-16 DIAGNOSIS — N184 Chronic kidney disease, stage 4 (severe): Secondary | ICD-10-CM

## 2019-01-16 DIAGNOSIS — I129 Hypertensive chronic kidney disease with stage 1 through stage 4 chronic kidney disease, or unspecified chronic kidney disease: Secondary | ICD-10-CM | POA: Diagnosis not present

## 2019-01-16 DIAGNOSIS — F0390 Unspecified dementia without behavioral disturbance: Secondary | ICD-10-CM | POA: Diagnosis not present

## 2019-01-16 NOTE — Progress Notes (Signed)
Location:   [place] Room Number: 113 D Place of Service:  SNF (31)   CODE STATUS: DNR  No Known Allergies  Chief Complaint  Patient presents with  . Acute Visit    Care Plan Meeting    HPI:  We have come together for her routine care plan meeting. Her BIMS is 5/15. Her weight is stable. There are no reports of falls. There are no reports of uncontrolled pain; no changes in appetite; no agitation or anxiety. There are no reports of fevers present.   Past Medical History:  Diagnosis Date  . Anemia   . Arthritis   . History of blood transfusion    "today is the first time" (08/05/2014)  . Hypertension     Past Surgical History:  Procedure Laterality Date  . APPENDECTOMY    . CATARACT EXTRACTION Left   . CHOLECYSTECTOMY    . COLONOSCOPY N/A 09/26/2014   Procedure: COLONOSCOPY;  Surgeon: [name], MD;  [facility];  Service: Endoscopy;  Laterality: N/A;  1030am  . CYSTECTOMY     "had cyst taken off lower back"  . ESOPHAGOGASTRODUODENOSCOPY N/A 08/22/2014   DZH:[id number] ring at the gastro junction/small HH  . ORIF FEMUR FRACTURE Left 08/07/2014   Procedure: OPEN REDUCTION INTERNAL FIXATION (ORIF)  LEFT FEMUR FRACTURE;  Surgeon: [name], MD;  Location: [facility];  Service: Orthopedics;  Laterality: Left;  . TONSILLECTOMY      Social History   Socioeconomic History  . Marital status: Widowed    Spouse name: Not on file  . Number of children: Not on file  . Years of education: Not on file  . Highest education level: Not on file  Occupational History  . Not on file  Social Needs  . Financial resource strain: Not hard at all  . Food insecurity:    Worry: Never true    Inability: Never true  . Transportation needs:    Medical: No    Non-medical: No  Tobacco Use  . Smoking status: Former Smoker    Packs/day: 0.25    Years: 16.00    Pack years: 4.00    Types: Cigarettes  . Smokeless tobacco: Never Used   Substance and Sexual Activity  . Alcohol use: No  . Drug use: No  . Sexual activity: Never  Lifestyle  . Physical activity:    Days per week: 0 days    Minutes per session: 0 min  . Stress: Not at all  Relationships  . Social connections:    Talks on phone: Never    Gets together: Once a week    Attends religious service: Never    Active member of club or organization: No    Attends meetings of clubs or organizations: Never    Relationship status: Widowed  . Intimate partner violence:    Fear of current or ex partner: No    Emotionally abused: No    Physically abused: No    Forced sexual activity: No  Other Topics Concern  . Not on file  Social History Narrative  . Not on file   History reviewed. No pertinent family history.    VITAL SIGNS BP (!) 94/54   Pulse 65   Temp (!) 97 F (36.1 C)   Resp 17   Ht 4\' 11"  (1.499 m)   Wt 103 lb 12.8 oz (47.1 kg)   BMI 20.97 kg/m   Outpatient Encounter Medications as of 01/16/2019  Medication Sig  . acetaminophen (TYLENOL) 325 MG tablet Take 650 mg by mouth every 6 (six) hours as needed.  Marland Kitchen amLODipine (NORVASC) 10 MG tablet Take 10 mg by mouth daily.  . ferrous sulfate 325 (65 FE) MG tablet Take 325 mg by mouth daily with breakfast.   . linaclotide (LINZESS) 145 MCG CAPS capsule Take 145 mcg by mouth daily before breakfast.  . metoprolol (LOPRESSOR) 50 MG tablet Take 1 tablet (50 mg total) by mouth 2 (two) times daily.  . NON FORMULARY Diet Type:  Regular, continue with thin liquids  . ondansetron (ZOFRAN) 4 MG tablet Take 4 mg by mouth 2 (two) times daily as needed.   Vladimir Faster Glycol-Propyl Glycol (SYSTANE) 0.4-0.3 % GEL ophthalmic gel Place 1 application into both eyes 2 (two) times daily at 10 AM and 5 PM.   No facility-administered encounter medications on file as of 01/16/2019.      SIGNIFICANT DIAGNOSTIC EXAMS  LABS REVIEWED PREVIOUS;   06-10-18: wbc 6.0; hgb 11.4; hct 35.7; mcv 84.4 plt 300; glucose 143; bun 39;  creat 1.75; k+ 4.5;na++ 136; ca 8.9 liver normal albumi 3.8  08-15-18: wbc 6.0; hgb 10.6; hct 35.3;mcv 85.;9 plt 349; glucose 101; bun 31; creat 1.78; k+ 4.4; na++ 139; ca 9.3  12-18-18: wbc 5.6; hgb 11.3 hct 37.2; mcv 84.5; plt 317; glucose 101; bun 46; creat 1.83; k+ 4.4; na++ 139; ca 9.2 liver normal albumin 4.3   NO NEW LABS.    Review of Systems  Unable to perform ROS: Dementia (unable to participate )    Physical Exam Constitutional:      General: She is not in acute distress.    Appearance: She is underweight. She is not diaphoretic.  Eyes:     Comments:  History of left cataract removal    Neck:     Musculoskeletal: Neck supple.     Thyroid: No thyromegaly.  Cardiovascular:     Rate and Rhythm: Normal rate and regular rhythm.     Pulses: Normal pulses.     Heart sounds: Normal heart sounds.  Pulmonary:     Effort: Pulmonary effort is normal. No respiratory distress.     Breath sounds: Normal breath sounds.  Abdominal:     General: Bowel sounds are normal. There is no distension.     Palpations: Abdomen is soft.     Tenderness: There is no abdominal tenderness.  Musculoskeletal:     Right lower leg: No edema.     Left lower leg: No edema.     Comments: Is able to move all extremities  Uses wheelchair  History of left femur ORIF   Lymphadenopathy:     Cervical: No cervical adenopathy.  Skin:    General: Skin is warm and dry.  Neurological:     Mental Status: She is alert. Mental status is at baseline.  Psychiatric:        Mood and Affect: Mood normal.      Assessment/Plan  TODAY:   1. Benign hypertension with CKD (chronic kidney disease) stage IV 2. Dementia without behavioral disturbance unspecified dementia type 3. CKD (chronic kidney disease) stage 4: GFR 15-29 ml/min   Will continue current medications Will continue current plan of care Will continue to monitor her status.      MD is aware of resident's narcotic use and is in agreement with  current plan of care. We will attempt to wean resident as apropriate   Ok Edwards NP [facility]  Contact [phone] Monday through Friday 8am- 5pm  After hours call [phone]

## 2019-01-18 ENCOUNTER — Encounter: Payer: Self-pay | Admitting: Adult Health

## 2019-01-18 ENCOUNTER — Non-Acute Institutional Stay (SKILLED_NURSING_FACILITY): Payer: Medicare HMO | Admitting: Adult Health

## 2019-01-18 DIAGNOSIS — F0390 Unspecified dementia without behavioral disturbance: Secondary | ICD-10-CM

## 2019-01-18 DIAGNOSIS — C189 Malignant neoplasm of colon, unspecified: Secondary | ICD-10-CM

## 2019-01-18 DIAGNOSIS — I10 Essential (primary) hypertension: Secondary | ICD-10-CM | POA: Diagnosis not present

## 2019-01-18 NOTE — Progress Notes (Signed)
Location:    [place] Room Number: 113/D Place of Service:  SNF (31)   CODE STATUS: DNR  No Known Allergies  Chief Complaint  Patient presents with  . Medical Management of Chronic Issues    Dementia without behavioral disturbance unspecified dementia type; malignant neoplasm of colon unspecified part of colon; essential hypertension     HPI:  She is a 85 year old long term resident of this facility being seen for the management of her chronic illnesses; dementia; colon cancer; hypertension. She has had some low blood pressure readings. There are no reports of uncontrolled pain; no changes in appetite; no reports os anxiety. No reports of fevers.   Past Medical History:  Diagnosis Date  . Anemia   . Arthritis   . History of blood transfusion    "today is the first time" (08/05/2014)  . Hypertension     Past Surgical History:  Procedure Laterality Date  . APPENDECTOMY    . CATARACT EXTRACTION Left   . CHOLECYSTECTOMY    . COLONOSCOPY N/A 09/26/2014   Procedure: COLONOSCOPY;  Surgeon: [name], MD;  [facility];  Service: Endoscopy;  Laterality: N/A;  1030am  . CYSTECTOMY     "had cyst taken off lower back"  . ESOPHAGOGASTRODUODENOSCOPY N/A 08/22/2014   VPX:[id number] ring at the gastro junction/small HH  . ORIF FEMUR FRACTURE Left 08/07/2014   Procedure: OPEN REDUCTION INTERNAL FIXATION (ORIF)  LEFT FEMUR FRACTURE;  Surgeon: [name], MD;  Location: [facility];  Service: Orthopedics;  Laterality: Left;  . TONSILLECTOMY      Social History   Socioeconomic History  . Marital status: Widowed    Spouse name: Not on file  . Number of children: Not on file  . Years of education: Not on file  . Highest education level: Not on file  Occupational History  . Not on file  Social Needs  . Financial resource strain: Not hard at all  . Food insecurity:    Worry: Never true    Inability: Never true  . Transportation needs:   Medical: No    Non-medical: No  Tobacco Use  . Smoking status: Former Smoker    Packs/day: 0.25    Years: 16.00    Pack years: 4.00    Types: Cigarettes  . Smokeless tobacco: Never Used  Substance and Sexual Activity  . Alcohol use: No  . Drug use: No  . Sexual activity: Never  Lifestyle  . Physical activity:    Days per week: 0 days    Minutes per session: 0 min  . Stress: Not at all  Relationships  . Social connections:    Talks on phone: Never    Gets together: Once a week    Attends religious service: Never    Active member of club or organization: No    Attends meetings of clubs or organizations: Never    Relationship status: Widowed  . Intimate partner violence:    Fear of current or ex partner: No    Emotionally abused: No    Physically abused: No    Forced sexual activity: No  Other Topics Concern  . Not on file  Social History Narrative  . Not on file   History reviewed. No pertinent family history.    VITAL SIGNS BP (!) 94/54   Pulse 65   Temp 98.2 F (36.8 C) (Oral)   Resp 17   Ht 4\' 11"  (1.499 m)  Wt 103 lb 12.8 oz (47.1 kg)   SpO2 94%   BMI 20.97 kg/m   Outpatient Encounter Medications as of 01/18/2019  Medication Sig  . acetaminophen (TYLENOL) 325 MG tablet Take 650 mg by mouth every 6 (six) hours as needed.  Marland Kitchen amLODipine (NORVASC) 10 MG tablet Take 10 mg by mouth daily.  . ferrous sulfate 325 (65 FE) MG tablet Take 325 mg by mouth daily with breakfast.   . linaclotide (LINZESS) 145 MCG CAPS capsule Take 145 mcg by mouth daily before breakfast.  . metoprolol (LOPRESSOR) 50 MG tablet Take 1 tablet (50 mg total) by mouth 2 (two) times daily.  . NON FORMULARY Diet Type:  Regular, continue with thin liquids  . ondansetron (ZOFRAN) 4 MG tablet Take 4 mg by mouth 2 (two) times daily as needed.   Vladimir Faster Glycol-Propyl Glycol (SYSTANE) 0.4-0.3 % GEL ophthalmic gel Place 1 application into both eyes 2 (two) times daily at 10 AM and 5 PM.   No  facility-administered encounter medications on file as of 01/18/2019.      SIGNIFICANT DIAGNOSTIC EXAMS   LABS REVIEWED PREVIOUS;   06-10-18: wbc 6.0; hgb 11.4; hct 35.7; mcv 84.4 plt 300; glucose 143; bun 39; creat 1.75; k+ 4.5;na++ 136; ca 8.9 liver normal albumi 3.8  08-15-18: wbc 6.0; hgb 10.6; hct 35.3;mcv 85.;9 plt 349; glucose 101; bun 31; creat 1.78; k+ 4.4; na++ 139; ca 9.3  12-18-18: wbc 5.6; hgb 11.3 hct 37.2; mcv 84.5; plt 317; glucose 101; bun 46; creat 1.83; k+ 4.4; na++ 139; ca 9.2 liver normal albumin 4.3   NO NEW LABS.   Review of Systems  Unable to perform ROS: Dementia (unable to participate )    Physical Exam Constitutional:      General: She is not in acute distress.    Appearance: She is underweight. She is not diaphoretic.  Eyes:     Comments:  History of left cataract removal     Neck:     Musculoskeletal: Neck supple.     Thyroid: No thyromegaly.  Cardiovascular:     Rate and Rhythm: Normal rate and regular rhythm.     Pulses: Normal pulses.     Heart sounds: Normal heart sounds.  Pulmonary:     Effort: Pulmonary effort is normal. No respiratory distress.     Breath sounds: Normal breath sounds.  Abdominal:     General: Bowel sounds are normal. There is no distension.     Palpations: Abdomen is soft.     Tenderness: There is no abdominal tenderness.  Musculoskeletal:     Right lower leg: No edema.     Left lower leg: No edema.     Comments: Is able to move all extremities  Uses wheelchair  History of left femur ORIF    Lymphadenopathy:     Cervical: No cervical adenopathy.  Skin:    General: Skin is warm and dry.  Neurological:     Mental Status: She is alert. Mental status is at baseline.  Psychiatric:        Mood and Affect: Mood normal.     ASSESSMENT/ PLAN:  TODAY:   1. Essential hypertension: is stable b/p 94/54 will continue lopressor 50 mg twice daily and will lower norvasc to 5 mg daily  2. Colon cancer: without change will  monitor   3. Unspecified type dementia without behavioral disturbance: is without change weight is 103 ( previous 101, 102) pounds; will monitor    PREVIOUS  4.  Left femoral shaft fracture: history of left femur ORIF: is stable has tylenol as needed for pain will monitor   5. Chronic idiopathic constipation: is stable will continue linzess 145 mcg daily   6. Benign hypertension with CKD (Chronic kidney disease) stage IV is stable bun 48; creat 1.83; will monitor   7. Unspecified iron deficiency anemia: is stable hgb 11.3 will continue iron daiy    MD is aware of resident's narcotic use and is in agreement with current plan of care. We will attempt to wean resident as apropriate   Ok Edwards NP [facility]  Contact [phone] Monday through Friday 8am- 5pm  After hours call [phone]

## 2019-02-07 ENCOUNTER — Encounter (HOSPITAL_COMMUNITY)
Admission: RE | Admit: 2019-02-07 | Discharge: 2019-02-07 | Disposition: A | Discharge status: Home / Self Care | Payer: Medicare HMO | Source: Skilled Nursing Facility | Attending: Internal Medicine | Admitting: Internal Medicine

## 2019-02-07 DIAGNOSIS — K5900 Constipation, unspecified: Secondary | ICD-10-CM | POA: Insufficient documentation

## 2019-02-07 DIAGNOSIS — I129 Hypertensive chronic kidney disease with stage 1 through stage 4 chronic kidney disease, or unspecified chronic kidney disease: Secondary | ICD-10-CM | POA: Insufficient documentation

## 2019-02-07 DIAGNOSIS — E876 Hypokalemia: Secondary | ICD-10-CM | POA: Insufficient documentation

## 2019-02-07 DIAGNOSIS — N189 Chronic kidney disease, unspecified: Secondary | ICD-10-CM | POA: Insufficient documentation

## 2019-02-07 LAB — CBC WITH DIFFERENTIAL/PLATELET
Abs Immature Granulocytes: 0.04 10*3/uL (ref 0.00–0.07)
Basophils Absolute: 0.1 10*3/uL (ref 0.0–0.1)
Basophils Relative: 1 %
Eosinophils Absolute: 0.1 10*3/uL (ref 0.0–0.5)
Eosinophils Relative: 3 %
HCT: 35.4 % — ABNORMAL LOW (ref 36.0–46.0)
Hemoglobin: 10.8 g/dL — ABNORMAL LOW (ref 12.0–15.0)
Immature Granulocytes: 1 %
Lymphocytes Relative: 40 %
Lymphs Abs: 2 10*3/uL (ref 0.7–4.0)
MCH: 25.8 pg — ABNORMAL LOW (ref 26.0–34.0)
MCHC: 30.5 g/dL (ref 30.0–36.0)
MCV: 84.5 fL (ref 80.0–100.0)
Monocytes Absolute: 0.7 10*3/uL (ref 0.1–1.0)
Monocytes Relative: 13 %
Neutro Abs: 2.1 10*3/uL (ref 1.7–7.7)
Neutrophils Relative %: 42 %
Platelets: 302 10*3/uL (ref 150–400)
RBC: 4.19 MIL/uL (ref 3.87–5.11)
RDW: 13.6 % (ref 11.5–15.5)
WBC: 4.9 10*3/uL (ref 4.0–10.5)
nRBC: 0 % (ref 0.0–0.2)

## 2019-02-07 LAB — COMPREHENSIVE METABOLIC PANEL
ALT: 9 U/L (ref 0–44)
AST: 13 U/L — ABNORMAL LOW (ref 15–41)
Albumin: 3.9 g/dL (ref 3.5–5.0)
Alkaline Phosphatase: 80 U/L (ref 38–126)
Anion gap: 12 (ref 5–15)
BUN: 47 mg/dL — ABNORMAL HIGH (ref 8–23)
CO2: 19 mmol/L — ABNORMAL LOW (ref 22–32)
Calcium: 9.2 mg/dL (ref 8.9–10.3)
Chloride: 105 mmol/L (ref 98–111)
Creatinine, Ser: 2.3 mg/dL — ABNORMAL HIGH (ref 0.44–1.00)
GFR calc Af Amer: 21 mL/min — ABNORMAL LOW (ref >60)
GFR calc non Af Amer: 18 mL/min — ABNORMAL LOW (ref >60)
Glucose, Bld: 102 mg/dL — ABNORMAL HIGH (ref 70–99)
Potassium: 4.1 mmol/L (ref 3.5–5.1)
Sodium: 136 mmol/L (ref 135–145)
Total Bilirubin: 0.4 mg/dL (ref 0.3–1.2)
Total Protein: 6.8 g/dL (ref 6.5–8.1)

## 2019-02-11 ENCOUNTER — Encounter: Payer: Self-pay | Admitting: Internal Medicine

## 2019-02-11 ENCOUNTER — Non-Acute Institutional Stay (SKILLED_NURSING_FACILITY): Payer: Medicare HMO | Admitting: Internal Medicine

## 2019-02-11 DIAGNOSIS — E872 Acidosis, unspecified: Secondary | ICD-10-CM

## 2019-02-11 DIAGNOSIS — R630 Anorexia: Secondary | ICD-10-CM

## 2019-02-11 DIAGNOSIS — I10 Essential (primary) hypertension: Secondary | ICD-10-CM | POA: Diagnosis not present

## 2019-02-11 DIAGNOSIS — F0390 Unspecified dementia without behavioral disturbance: Secondary | ICD-10-CM | POA: Diagnosis not present

## 2019-02-11 DIAGNOSIS — N184 Chronic kidney disease, stage 4 (severe): Secondary | ICD-10-CM | POA: Diagnosis not present

## 2019-02-11 DIAGNOSIS — I129 Hypertensive chronic kidney disease with stage 1 through stage 4 chronic kidney disease, or unspecified chronic kidney disease: Secondary | ICD-10-CM

## 2019-02-11 DIAGNOSIS — D509 Iron deficiency anemia, unspecified: Secondary | ICD-10-CM

## 2019-02-11 DIAGNOSIS — R222 Localized swelling, mass and lump, trunk: Secondary | ICD-10-CM | POA: Insufficient documentation

## 2019-02-11 NOTE — Progress Notes (Signed)
Location:  [place] Room Number: [place] of Service:  SNF [phone]) Provider:  Veleta Miners, MD  Sinda Du, MD  Patient Care Team: Sinda Du, MD as PCP - General (Pulmonary Disease) [name], MD as Consulting Physician (Gastroenterology)  Extended Emergency Contact Information Primary Emergency Contact: [name] Phone: [phone] Mobile Phone: [phone] Relation: Niece Secondary Emergency Contact: [place] Phone: [phone] Relation: Neighbor  Code Status:  DNR Goals of care: Advanced Directive information Advanced Directives 02/11/2019  Does Patient Have a Medical Advance Directive? Yes  Type of Paramedic of [place];Out of facility DNR (pink MOST or yellow form)  Does patient want to make changes to medical advance directive? No - Patient declined  Copy of [place] in Chart? -  Would patient like information on creating a medical advance directive? No - Patient declined     Chief Complaint  Patient presents with  . Medical Management of Chronic Issues    Anemia and Hypertension    HPI:  Pt is a 85 y.o. female seen today for medical management of chronic diseases.    Patient has h/o Hypertension, Colorectal cancer s/p Right Colectomy, Anemia, Chronic renal disease.Stage 4, H/o Left Knee and Distal femoral Fracture.  She is long term resident of facility Patient has lost few Pounds. And she was depressed today. Does not have good Appetite She has lump in her Lower spinal area which is stable and she denies any Pain . Her creat is slightly high with Worsening Renal Function Patient denied any fever, Cough or SOB No LE swelling  Past Medical History:  Diagnosis Date  . Anemia   . Arthritis   . History of blood transfusion    "today is the first time" (08/05/2014)  . Hypertension    Past Surgical  History:  Procedure Laterality Date  . APPENDECTOMY    . CATARACT EXTRACTION Left   . CHOLECYSTECTOMY    . COLONOSCOPY N/A 09/26/2014   Procedure: COLONOSCOPY;  Surgeon: [name], MD;  [facility];  Service: Endoscopy;  Laterality: N/A;  1030am  . CYSTECTOMY     "had cyst taken off lower back"  . ESOPHAGOGASTRODUODENOSCOPY N/A 08/22/2014   JOI:NOMVEHMC ring at the gastro junction/small HH  . ORIF FEMUR FRACTURE Left 08/07/2014   Procedure: OPEN REDUCTION INTERNAL FIXATION (ORIF)  LEFT FEMUR FRACTURE;  Surgeon: [name], MD;  Location: [place];  Service: Orthopedics;  Laterality: Left;  . TONSILLECTOMY      No Known Allergies  Outpatient Encounter Medications as of 02/11/2019  Medication Sig  . acetaminophen (TYLENOL) 325 MG tablet Take 650 mg by mouth every 6 (six) hours as needed.  Marland Kitchen amLODipine (NORVASC) 5 MG tablet Take 5 mg by mouth daily.   . ferrous sulfate 325 (65 FE) MG tablet Take 325 mg by mouth daily with breakfast.   . linaclotide (LINZESS) 145 MCG CAPS capsule Take 145 mcg by mouth daily before breakfast.  . metoprolol (LOPRESSOR) 50 MG tablet Take 1 tablet (50 mg total) by mouth 2 (two) times daily.  . NON FORMULARY Diet Type:  Regular, continue with thin liquids  . ondansetron (ZOFRAN) 4 MG tablet Take 4 mg by mouth 2 (two) times daily as needed.   Vladimir Faster Glycol-Propyl Glycol (SYSTANE) 0.4-0.3 % GEL ophthalmic gel Place 1 application into both eyes 2 (two) times daily at 10 AM and  5 PM.   No facility-administered encounter medications on file as of 02/11/2019.     Review of Systems  Constitutional: Positive for appetite change.  HENT: Negative.   Respiratory: Negative.   Cardiovascular: Negative.   Gastrointestinal: Negative.   Genitourinary: Negative.   Musculoskeletal: Negative.   Skin: Negative.   Neurological: Negative.   Psychiatric/Behavioral: Positive for dysphoric mood.    Immunization History  Administered Date(s)  Administered  . Influenza-Unspecified 06/09/2016, 08/01/2017, 06/07/2018  . Pneumococcal Conjugate-13 08/01/2017  . Pneumococcal Polysaccharide-23 06/13/2016  . Tdap 08/05/2014, 05/18/2017   Pertinent  Health Maintenance Due  Topic Date Due  . INFLUENZA VACCINE  04/06/2019  . DEXA SCAN  Completed  . PNA vac Low Risk Adult  Completed   Fall Risk  06/12/2018 06/05/2017  Falls in the past year? No Yes  Number falls in past yr: - 1  Injury with Fall? - Yes   Functional Status Survey:    Vitals:   02/11/19 0835  BP: (!) 146/56  Pulse: (!) 54  Resp: 18  Temp: 97.6 F (36.4 C)  Weight: 100 lb 12.8 oz (45.7 kg)  Height: 4\' 11"  (1.499 m)   Body mass index is 20.36 kg/m. Physical Exam Vitals signs reviewed.  Constitutional:      Appearance: She is normal weight.  HENT:     Head: Normocephalic.     Nose: Nose normal.     Mouth/Throat:     Mouth: Mucous membranes are moist.     Pharynx: Oropharynx is clear.  Eyes:     Pupils: Pupils are equal, round, and reactive to light.  Neck:     Musculoskeletal: Neck supple.  Cardiovascular:     Rate and Rhythm: Normal rate and regular rhythm.     Pulses: Normal pulses.     Heart sounds: Normal heart sounds.  Pulmonary:     Effort: Pulmonary effort is normal. No respiratory distress.     Breath sounds: Normal breath sounds. No wheezing.  Abdominal:     General: Abdomen is flat. Bowel sounds are normal. There is no distension.     Palpations: Abdomen is soft.     Tenderness: There is no abdominal tenderness.  Musculoskeletal:        General: No swelling.     Comments: Has  Mass in the Lower part of her Back. It is cystic in character. Not tender or red  Skin:    General: Skin is warm and dry.  Neurological:     General: No focal deficit present.     Mental Status: She is alert.     Comments: Not Oriented. Did not know the Name of the Place or her DOB. She is mostly Wheelchair Bound. Walks with MOD assist   Psychiatric:      Comments: Seemed Depressed     Labs reviewed: Recent Labs    08/15/18 0729 12/18/18 0700 02/07/19 0836  NA 139 139 136  K 4.4 4.4 4.1  CL 109 111 105  CO2 23 20* 19*  GLUCOSE 101* 101* 102*  BUN 31* 46* 47*  CREATININE 1.78* 1.83* 2.30*  CALCIUM 9.3 9.2 9.2   Recent Labs    06/10/18 2327 12/18/18 0700 02/07/19 0836  AST 15 15 13*  ALT 11 10 9   ALKPHOS 81 89 80  BILITOT 0.2* 0.6 0.4  PROT 7.2 7.4 6.8  ALBUMIN 3.8 4.3 3.9   Recent Labs    08/15/18 0729 12/18/18 0700 02/07/19 0836  WBC 6.0 5.6 4.9  NEUTROABS 3.3 2.5 2.1  HGB 10.6* 11.3* 10.8*  HCT 35.3* 37.2 35.4*  MCV 85.9 84.5 84.5  PLT 349 317 302   Lab Results  Component Value Date   TSH 2.515 08/01/2017   No results found for: HGBA1C No results found for: CHOL, HDL, LDLCALC, LDLDIRECT, TRIG, CHOLHDL  Significant Diagnostic Results in last 30 days:  No results found.  Assessment/Plan Metabolic acidosis due to CKD Start on Bicarb BID Repeat BMP in 2 weeks  Paraspinal mass Unchanged Not tender No work up per Family  Decreased appetite with Depression Will start her on Remeron  Benign hypertension with CKD (chronic kidney disease) stage IV (HCC) On Norvasc and Lopressor  CKD (chronic kidney disease) stage 4, GFR 15-29 ml/min (HCC) Worsenign of CKD No Renal consult due to patient dementia and Fraility Not candidate for Dialysis  Iron deficiency anemia,  Hgb Stable on Iron Constipation with Abdominal Discomfort Patient was started on Linzess and has Been doing well since then  Dementia Continue supportive care. Not on Any treatment.    Family/ staff Communication:   Labs/tests ordered:  BMP  Total time spent in this patient care encounter was  25_  minutes; greater than 50% of the visit spent counseling patient and staff, reviewing records , Labs and coordinating care for problems addressed at this encounter.

## 2019-02-12 DIAGNOSIS — I129 Hypertensive chronic kidney disease with stage 1 through stage 4 chronic kidney disease, or unspecified chronic kidney disease: Secondary | ICD-10-CM | POA: Diagnosis not present

## 2019-02-12 DIAGNOSIS — Z1159 Encounter for screening for other viral diseases: Secondary | ICD-10-CM | POA: Diagnosis not present

## 2019-02-25 ENCOUNTER — Encounter (HOSPITAL_COMMUNITY)
Admission: AD | Admit: 2019-02-25 | Discharge: 2019-02-25 | Disposition: A | Discharge status: Home / Self Care | Payer: Medicare HMO | Source: Ambulatory Visit

## 2019-02-25 DIAGNOSIS — E876 Hypokalemia: Secondary | ICD-10-CM | POA: Diagnosis not present

## 2019-02-25 DIAGNOSIS — I129 Hypertensive chronic kidney disease with stage 1 through stage 4 chronic kidney disease, or unspecified chronic kidney disease: Secondary | ICD-10-CM | POA: Diagnosis not present

## 2019-02-25 DIAGNOSIS — K5900 Constipation, unspecified: Secondary | ICD-10-CM | POA: Diagnosis not present

## 2019-02-25 DIAGNOSIS — N189 Chronic kidney disease, unspecified: Secondary | ICD-10-CM | POA: Diagnosis not present

## 2019-02-25 LAB — BASIC METABOLIC PANEL
Anion gap: 7 (ref 5–15)
BUN: 42 mg/dL — ABNORMAL HIGH (ref 8–23)
CO2: 25 mmol/L (ref 22–32)
Calcium: 8.7 mg/dL — ABNORMAL LOW (ref 8.9–10.3)
Chloride: 106 mmol/L (ref 98–111)
Creatinine, Ser: 2.2 mg/dL — ABNORMAL HIGH (ref 0.44–1.00)
GFR calc Af Amer: 22 mL/min — ABNORMAL LOW (ref >60)
GFR calc non Af Amer: 19 mL/min — ABNORMAL LOW (ref >60)
Glucose, Bld: 86 mg/dL (ref 70–99)
Potassium: 4.9 mmol/L (ref 3.5–5.1)
Sodium: 138 mmol/L (ref 135–145)

## 2019-03-13 ENCOUNTER — Non-Acute Institutional Stay (SKILLED_NURSING_FACILITY): Payer: Medicare HMO | Admitting: Adult Health

## 2019-03-13 ENCOUNTER — Encounter: Payer: Self-pay | Admitting: Adult Health

## 2019-03-13 DIAGNOSIS — I129 Hypertensive chronic kidney disease with stage 1 through stage 4 chronic kidney disease, or unspecified chronic kidney disease: Secondary | ICD-10-CM | POA: Diagnosis not present

## 2019-03-13 DIAGNOSIS — K5904 Chronic idiopathic constipation: Secondary | ICD-10-CM | POA: Diagnosis not present

## 2019-03-13 DIAGNOSIS — N184 Chronic kidney disease, stage 4 (severe): Secondary | ICD-10-CM

## 2019-03-13 DIAGNOSIS — D509 Iron deficiency anemia, unspecified: Secondary | ICD-10-CM

## 2019-03-13 NOTE — Progress Notes (Signed)
Location:   [place] Room Number: 113 D Place of Service:  SNF (31)   CODE STATUS: DNR  No Known Allergies  Chief Complaint  Patient presents with  . Medical Management of Chronic Issues       Chronic idiopathic constipation:  Benign hypertension with CKD (chronic kidney disease) stage IV . Unspecified iron deficiency anemia:     HPI:  Alicia Guerra is a 85 year old long term resident of this facility being seen for the management of her chronic illnesses: constipation; ckd; anemia. There are no reports of changes in appetite; no reports of agitation or anxiety; no reports of uncontrolled pain.   Past Medical History:  Diagnosis Date  . Anemia   . Arthritis   . History of blood transfusion    "today is the first time" (08/05/2014)  . Hypertension     Past Surgical History:  Procedure Laterality Date  . APPENDECTOMY    . CATARACT EXTRACTION Left   . CHOLECYSTECTOMY    . COLONOSCOPY N/A 09/26/2014   Procedure: COLONOSCOPY;  Surgeon: [name], MD;  [facility];  Service: Endoscopy;  Laterality: N/A;  1030am  . CYSTECTOMY     "had cyst taken off lower back"  . ESOPHAGOGASTRODUODENOSCOPY N/A 08/22/2014   PNT:[id number] ring at the gastro junction/small HH  . ORIF FEMUR FRACTURE Left 08/07/2014   Procedure: OPEN REDUCTION INTERNAL FIXATION (ORIF)  LEFT FEMUR FRACTURE;  Surgeon: [name], MD;  Location: [place];  Service: Orthopedics;  Laterality: Left;  . TONSILLECTOMY      Social History   Socioeconomic History  . Marital status: Widowed    Spouse name: Not on file  . Number of children: Not on file  . Years of education: Not on file  . Highest education level: Not on file  Occupational History  . Not on file  Social Needs  . Financial resource strain: Not hard at all  . Food insecurity    Worry: Never true    Inability: Never true  . Transportation needs    Medical: No    Non-medical: No  Tobacco Use  . Smoking  status: Former Smoker    Packs/day: 0.25    Years: 16.00    Pack years: 4.00    Types: Cigarettes  . Smokeless tobacco: Never Used  Substance and Sexual Activity  . Alcohol use: No  . Drug use: No  . Sexual activity: Never  Lifestyle  . Physical activity    Days per week: 0 days    Minutes per session: 0 min  . Stress: Not at all  Relationships  . Social Herbalist on phone: Never    Gets together: Once a week    Attends religious service: Never    Active member of club or organization: No    Attends meetings of clubs or organizations: Never    Relationship status: Widowed  . Intimate partner violence    Fear of current or ex partner: No    Emotionally abused: No    Physically abused: No    Forced sexual activity: No  Other Topics Concern  . Not on file  Social History Narrative  . Not on file   History reviewed. No pertinent family history.    VITAL SIGNS Ht 4\' 11"  (1.499 m)   Wt 100 lb 12.8 oz (45.7 kg)   BMI 20.36 kg/m   Outpatient Encounter Medications as of 03/13/2019  Medication  Sig  . acetaminophen (TYLENOL) 325 MG tablet Take 650 mg by mouth every 6 (six) hours as needed.  Marland Kitchen amLODipine (NORVASC) 5 MG tablet Take 5 mg by mouth daily.   . ferrous sulfate 325 (65 FE) MG tablet Take 325 mg by mouth daily with breakfast.   . linaclotide (LINZESS) 145 MCG CAPS capsule Take 145 mcg by mouth daily before breakfast.  . metoprolol (LOPRESSOR) 50 MG tablet Take 1 tablet (50 mg total) by mouth 2 (two) times daily.  . mirtazapine (REMERON) 15 MG tablet Take 7.5 mg by mouth at bedtime.  . NON FORMULARY Diet Type:  Regular, continue with thin liquids  . ondansetron (ZOFRAN) 4 MG tablet Take 4 mg by mouth 2 (two) times daily as needed.   Vladimir Faster Glycol-Propyl Glycol (SYSTANE) 0.4-0.3 % GEL ophthalmic gel Place 1 application into both eyes 2 (two) times daily at 10 AM and 5 PM.  . sodium bicarbonate 650 MG tablet Take 650 mg by mouth 2 (two) times daily.    No facility-administered encounter medications on file as of 03/13/2019.      SIGNIFICANT DIAGNOSTIC EXAMS  LABS REVIEWED PREVIOUS;   06-10-18: wbc 6.0; hgb 11.4; hct 35.7; mcv 84.4 plt 300; glucose 143; bun 39; creat 1.75; k+ 4.5;na++ 136; ca 8.9 liver normal albumi 3.8  08-15-18: wbc 6.0; hgb 10.6; hct 35.3;mcv 85.;9 plt 349; glucose 101; bun 31; creat 1.78; k+ 4.4; na++ 139; ca 9.3  12-18-18: wbc 5.6; hgb 11.3 hct 37.2; mcv 84.5; plt 317; glucose 101; bun 46; creat 1.83; k+ 4.4; na++ 139; ca 9.2 liver normal albumin 4.3   TODAY;   02-07-19: wbc 4.9; hgb 10.8; hct 35.4; mcv 84.5; plt 302; glucose 102; bun 47; creat 2.30; k+ 4.1; na++ 136; ca 9.2; liver normal albumin 3.9 02-25-19: glucose 86; bun 42; creat 2.20 ;k+ 4.9; na++ 138; ca 8.7    Review of Systems  Unable to perform ROS: Dementia (unable to participate )    Physical Exam Constitutional:      General: Alicia Guerra is not in acute distress.    Appearance: Alicia Guerra is well-developed. Alicia Guerra is not diaphoretic.  Eyes:     Comments: History of left cataract removal      Neck:     Musculoskeletal: Neck supple.     Thyroid: No thyromegaly.  Cardiovascular:     Rate and Rhythm: Normal rate and regular rhythm.     Pulses: Normal pulses.     Heart sounds: Normal heart sounds.  Pulmonary:     Effort: Pulmonary effort is normal. No respiratory distress.     Breath sounds: Normal breath sounds.  Abdominal:     General: Bowel sounds are normal. There is no distension.     Palpations: Abdomen is soft.     Tenderness: There is no abdominal tenderness.  Musculoskeletal:     Right lower leg: No edema.     Left lower leg: No edema.     Comments: Is able to move all extremities  Uses wheelchair  History of left femur ORIF     Lymphadenopathy:     Cervical: No cervical adenopathy.  Skin:    General: Skin is warm and dry.  Neurological:     Mental Status: Alicia Guerra is alert. Mental status is at baseline.  Psychiatric:        Mood and Affect: Mood  normal.      ASSESSMENT/ PLAN:  TODAY:   1. Chronic idiopathic constipation: is stable will continue linzess  145 mcg daily   2. Benign hypertension with CKD (chronic kidney disease) stage IV is stable bun 42; creat 2.20 will monitor  3. Unspecified iron deficiency anemia: is stable hgb 10.8; will continue iron daily   PREVIOUS  4.  Left femoral shaft fracture: history of left femur ORIF: is stable has tylenol as needed for pain will monitor   5. Essential hypertension: is stablewill continue lopressor 50 mg twice daily and  norvasc  5 mg daily  6. Colon cancer: without change will monitor   7. Unspecified type dementia without behavioral disturbance: is without change weight is 100 ( previous 103, 101, 102) pounds; will monitor        MD is aware of resident's narcotic use and is in agreement with current plan of care. We will attempt to wean resident as apropriate   Ok Edwards NP [facility]  Contact [phone] Monday through Friday 8am- 5pm  After hours call [phone]

## 2019-04-09 DIAGNOSIS — Z20828 Contact with and (suspected) exposure to other viral communicable diseases: Secondary | ICD-10-CM | POA: Diagnosis not present

## 2019-04-10 ENCOUNTER — Non-Acute Institutional Stay (SKILLED_NURSING_FACILITY): Payer: Medicare HMO | Admitting: Internal Medicine

## 2019-04-10 ENCOUNTER — Encounter: Payer: Self-pay | Admitting: Internal Medicine

## 2019-04-10 DIAGNOSIS — N184 Chronic kidney disease, stage 4 (severe): Secondary | ICD-10-CM | POA: Diagnosis not present

## 2019-04-10 DIAGNOSIS — I129 Hypertensive chronic kidney disease with stage 1 through stage 4 chronic kidney disease, or unspecified chronic kidney disease: Secondary | ICD-10-CM | POA: Diagnosis not present

## 2019-04-10 DIAGNOSIS — K5904 Chronic idiopathic constipation: Secondary | ICD-10-CM | POA: Diagnosis not present

## 2019-04-10 DIAGNOSIS — F0390 Unspecified dementia without behavioral disturbance: Secondary | ICD-10-CM

## 2019-04-10 NOTE — Progress Notes (Signed)
Location:  [place] Room Number: 113/D Place of Service:  SNF (31)  Hennie Duos, MD  Patient Care Team: Hennie Duos, MD as PCP - General (Internal Medicine) [name], MD as Consulting Physician (Gastroenterology) [facility] [place] ([place]) Gerlene Fee, NP as Nurse Practitioner (Geriatric Medicine)  Extended Emergency Contact Information Primary Emergency Contact: [name] [place] of [place] Phone: [phone] Mobile Phone: [phone] Relation: Niece Secondary Emergency Contact: Zena Amos [place] of [place] Phone: [phone] Relation: Neighbor    Allergies: Patient has no known allergies.  Chief Complaint  Patient presents with  . Medical Management of Chronic Issues    Routine visit of medical management, Flu Shot    HPI: Patient is 85 y.o. female who is being seen for routine issues of hypertension, constipation, and dementia.  Past Medical History:  Diagnosis Date  . Anemia   . Arthritis   . History of blood transfusion    "today is the first time" (08/05/2014)  . Hypertension     Past Surgical History:  Procedure Laterality Date  . APPENDECTOMY    . CATARACT EXTRACTION Left   . CHOLECYSTECTOMY    . COLONOSCOPY N/A 09/26/2014   Procedure: COLONOSCOPY;  Surgeon: [name], MD;  [facility];  Service: Endoscopy;  Laterality: N/A;  1030am  . CYSTECTOMY     "had cyst taken off lower back"  . ESOPHAGOGASTRODUODENOSCOPY N/A 08/22/2014   PTW:[id number] ring at the gastro junction/small HH  . ORIF FEMUR FRACTURE Left 08/07/2014   Procedure: OPEN REDUCTION INTERNAL FIXATION (ORIF)  LEFT FEMUR FRACTURE;  Surgeon: [name], MD;  Location: [place];  Service: Orthopedics;  Laterality: Left;  . TONSILLECTOMY      Outpatient Encounter Medications as of 04/10/2019  Medication Sig  . acetaminophen (TYLENOL) 325 MG tablet Take 650 mg by mouth  every 6 (six) hours as needed.  Marland Kitchen amLODipine (NORVASC) 5 MG tablet Take 5 mg by mouth daily.   . Ensure (ENSURE) Take 237 mLs by mouth daily. Due to weight loss  . ferrous sulfate 325 (65 FE) MG tablet Take 325 mg by mouth daily with breakfast.   . linaclotide (LINZESS) 145 MCG CAPS capsule Take 145 mcg by mouth daily before breakfast.  . metoprolol (LOPRESSOR) 50 MG tablet Take 1 tablet (50 mg total) by mouth 2 (two) times daily.  . mirtazapine (REMERON) 15 MG tablet Take 7.5 mg by mouth at bedtime.  . NON FORMULARY Diet Type:  Regular, continue with thin liquids  . ondansetron (ZOFRAN) 4 MG tablet Take 4 mg by mouth 2 (two) times daily as needed.   Vladimir Faster Glycol-Propyl Glycol (SYSTANE) 0.4-0.3 % GEL ophthalmic gel Place 1 application into both eyes 2 (two) times daily at 10 AM and 5 PM.  . sodium bicarbonate 650 MG tablet Take 650 mg by mouth 2 (two) times daily.   No facility-administered encounter medications on file as of 04/10/2019.      No orders of the defined types were placed in this encounter.   Immunization History  Administered Date(s) Administered  . Influenza-Unspecified 06/09/2016, 08/01/2017, 06/07/2018  . Pneumococcal Conjugate-13 08/01/2017  . Pneumococcal Polysaccharide-23 06/13/2016  . Tdap 08/05/2014, 05/18/2017    Social History   Tobacco Use  . Smoking status: Former Smoker    Packs/day: 0.25    Years: 16.00    Pack years: 4.00    Types: Cigarettes  . Smokeless tobacco: Never  Used  Substance Use Topics  . Alcohol use: No    Review of Systems  DATA OBTAINED: from nurse GENERAL:  no fevers, fatigue, appetite changes SKIN: No itching, rash HEENT: No complaint RESPIRATORY: No cough, wheezing, SOB CARDIAC: No chest pain, palpitations, lower extremity edema  GI: No abdominal pain, No N/V/D or constipation, No heartburn or reflux  GU: No dysuria, frequency or urgency, or incontinence  MUSCULOSKELETAL: No unrelieved bone/joint pain NEUROLOGIC: No  headache, dizziness  PSYCHIATRIC: No overt anxiety or sadness  Vitals:   04/10/19 1418  BP: 132/64  Pulse: 60  Resp: 18  Temp: 98.4 F (36.9 C)  SpO2: 94%   Body mass index is 20.56 kg/m. Physical Exam  GENERAL APPEARANCE: Alert, conversant, No acute distress  SKIN: No diaphoresis rash HEENT: Unremarkable RESPIRATORY: Breathing is even, unlabored. Lung sounds are clear   CARDIOVASCULAR: Heart RRR no murmurs, rubs or gallops. No peripheral edema  GASTROINTESTINAL: Abdomen is soft, non-tender, not distended w/ normal bowel sounds.  GENITOURINARY: Bladder non tender, not distended  MUSCULOSKELETAL: No abnormal joints or musculature NEUROLOGIC: Cranial nerves 2-12 grossly intact. Moves all extremities PSYCHIATRIC: Mood and affect with dementia, no behavioral issues  Patient Active Problem List   Diagnosis Date Noted  . Paraspinal mass 02/11/2019  . Chronic idiopathic constipation 11/13/2018  . Benign hypertension with CKD (chronic kidney disease) stage IV ([place]) 11/13/2018  . Dementia ([place]) 07/31/2017  . Osteoporosis 07/31/2017  . CKD (chronic kidney disease) stage 4, GFR 15-29 ml/min (HCC) 05/12/2017  . Colon cancer ([place]) 10/23/2014  . Essential hypertension 08/22/2014  . Left femoral shaft fracture ([place]) 08/08/2014  . Anemia, iron deficiency 08/08/2014  . Knee fracture 08/05/2014    CMP     Component Value Date/Time   NA 138 02/25/2019 0433   K 4.9 02/25/2019 0433   CL 106 02/25/2019 0433   CO2 25 02/25/2019 0433   GLUCOSE 86 02/25/2019 0433   BUN 42 (H) 02/25/2019 0433   CREATININE 2.20 (H) 02/25/2019 0433   CALCIUM 8.7 (L) 02/25/2019 0433   PROT 6.8 02/07/2019 0836   ALBUMIN 3.9 02/07/2019 0836   AST 13 (L) 02/07/2019 0836   ALT 9 02/07/2019 0836   ALKPHOS 80 02/07/2019 0836   BILITOT 0.4 02/07/2019 0836   GFRNONAA 19 (L) 02/25/2019 0433   GFRAA 22 (L) 02/25/2019 0433   Recent Labs    12/18/18 0700 02/07/19 0836 02/25/19 0433  NA 139 136 138  K 4.4  4.1 4.9  CL 111 105 106  CO2 20* 19* 25  GLUCOSE 101* 102* 86  BUN 46* 47* 42*  CREATININE 1.83* 2.30* 2.20*  CALCIUM 9.2 9.2 8.7*   Recent Labs    06/10/18 2327 12/18/18 0700 02/07/19 0836  AST 15 15 13*  ALT 11 10 9   ALKPHOS 81 89 80  BILITOT 0.2* 0.6 0.4  PROT 7.2 7.4 6.8  ALBUMIN 3.8 4.3 3.9   Recent Labs    08/15/18 0729 12/18/18 0700 02/07/19 0836  WBC 6.0 5.6 4.9  NEUTROABS 3.3 2.5 2.1  HGB 10.6* 11.3* 10.8*  HCT 35.3* 37.2 35.4*  MCV 85.9 84.5 84.5  PLT 349 317 302   No results for input(s): CHOL, LDLCALC, TRIG in the last 8760 hours.  Invalid input(s): HCL No results found for: MICROALBUR Lab Results  Component Value Date   TSH 2.515 08/01/2017   No results found for: HGBA1C No results found for: CHOL, HDL, LDLCALC, LDLDIRECT, TRIG, CHOLHDL  Significant Diagnostic Results in last 30  days:  No results found.  Assessment and Plan  Benign hypertension with CKD (chronic kidney disease) stage IV (HCC) Chronic and stable; continue Norvasc 5 mg daily and metoprolol 50 mg twice daily                  Chronic idiopathic constipation No reported problems; continue Linzess 145 mg daily  Dementia Chronic and stable; patient on no dementia specific medications; continue supportive care.    Hennie Duos, MD

## 2019-04-14 ENCOUNTER — Encounter: Payer: Self-pay | Admitting: Internal Medicine

## 2019-04-14 NOTE — Assessment & Plan Note (Signed)
Chronic and stable; patient on no dementia specific medications; continue supportive care.

## 2019-04-14 NOTE — Assessment & Plan Note (Signed)
No reported problems; continue Linzess 145 mg daily

## 2019-04-14 NOTE — Assessment & Plan Note (Signed)
Chronic and stable; continue Norvasc 5 mg daily and metoprolol 50 mg twice daily

## 2019-04-28 ENCOUNTER — Emergency Department (HOSPITAL_COMMUNITY)
Admission: EM | Admit: 2019-04-28 | Discharge: 2019-04-28 | Disposition: A | Discharge status: Skilled Nursing Facility | Payer: Medicare HMO | Attending: Emergency Medicine | Admitting: Emergency Medicine

## 2019-04-28 ENCOUNTER — Emergency Department (HOSPITAL_COMMUNITY): Payer: Medicare HMO

## 2019-04-28 ENCOUNTER — Inpatient Hospital Stay
Admission: RE | Admit: 2019-04-28 | Discharge: 2019-05-25 | Disposition: A | Discharge status: Still In Hospital | Payer: Medicare HMO | Source: Ambulatory Visit | Attending: Internal Medicine | Admitting: Internal Medicine

## 2019-04-28 ENCOUNTER — Encounter (HOSPITAL_COMMUNITY): Payer: Self-pay

## 2019-04-28 ENCOUNTER — Other Ambulatory Visit: Payer: Self-pay

## 2019-04-28 DIAGNOSIS — Z79899 Other long term (current) drug therapy: Secondary | ICD-10-CM | POA: Diagnosis not present

## 2019-04-28 DIAGNOSIS — N184 Chronic kidney disease, stage 4 (severe): Secondary | ICD-10-CM | POA: Insufficient documentation

## 2019-04-28 DIAGNOSIS — W19XXXA Unspecified fall, initial encounter: Secondary | ICD-10-CM | POA: Diagnosis not present

## 2019-04-28 DIAGNOSIS — F0390 Unspecified dementia without behavioral disturbance: Secondary | ICD-10-CM | POA: Insufficient documentation

## 2019-04-28 DIAGNOSIS — S79911A Unspecified injury of right hip, initial encounter: Secondary | ICD-10-CM | POA: Diagnosis not present

## 2019-04-28 DIAGNOSIS — Z87891 Personal history of nicotine dependence: Secondary | ICD-10-CM | POA: Insufficient documentation

## 2019-04-28 DIAGNOSIS — M25551 Pain in right hip: Secondary | ICD-10-CM | POA: Insufficient documentation

## 2019-04-28 DIAGNOSIS — I129 Hypertensive chronic kidney disease with stage 1 through stage 4 chronic kidney disease, or unspecified chronic kidney disease: Secondary | ICD-10-CM | POA: Diagnosis not present

## 2019-04-28 DIAGNOSIS — I10 Essential (primary) hypertension: Secondary | ICD-10-CM | POA: Diagnosis not present

## 2019-04-28 LAB — BASIC METABOLIC PANEL
Anion gap: 10 (ref 5–15)
BUN: 61 mg/dL — ABNORMAL HIGH (ref 8–23)
CO2: 26 mmol/L (ref 22–32)
Calcium: 9.4 mg/dL (ref 8.9–10.3)
Chloride: 102 mmol/L (ref 98–111)
Creatinine, Ser: 1.91 mg/dL — ABNORMAL HIGH (ref 0.44–1.00)
GFR calc Af Amer: 26 mL/min — ABNORMAL LOW (ref >60)
GFR calc non Af Amer: 22 mL/min — ABNORMAL LOW (ref >60)
Glucose, Bld: 107 mg/dL — ABNORMAL HIGH (ref 70–99)
Potassium: 4.7 mmol/L (ref 3.5–5.1)
Sodium: 138 mmol/L (ref 135–145)

## 2019-04-28 LAB — CBC WITH DIFFERENTIAL/PLATELET
Abs Immature Granulocytes: 0.05 10*3/uL (ref 0.00–0.07)
Basophils Absolute: 0 10*3/uL (ref 0.0–0.1)
Basophils Relative: 1 %
Eosinophils Absolute: 0.1 10*3/uL (ref 0.0–0.5)
Eosinophils Relative: 2 %
HCT: 35.4 % — ABNORMAL LOW (ref 36.0–46.0)
Hemoglobin: 10.6 g/dL — ABNORMAL LOW (ref 12.0–15.0)
Immature Granulocytes: 1 %
Lymphocytes Relative: 24 %
Lymphs Abs: 1.5 10*3/uL (ref 0.7–4.0)
MCH: 25.7 pg — ABNORMAL LOW (ref 26.0–34.0)
MCHC: 29.9 g/dL — ABNORMAL LOW (ref 30.0–36.0)
MCV: 85.9 fL (ref 80.0–100.0)
Monocytes Absolute: 0.6 10*3/uL (ref 0.1–1.0)
Monocytes Relative: 10 %
Neutro Abs: 3.8 10*3/uL (ref 1.7–7.7)
Neutrophils Relative %: 62 %
Platelets: 336 10*3/uL (ref 150–400)
RBC: 4.12 MIL/uL (ref 3.87–5.11)
RDW: 13.3 % (ref 11.5–15.5)
WBC: 6.1 10*3/uL (ref 4.0–10.5)
nRBC: 0 % (ref 0.0–0.2)

## 2019-04-28 NOTE — ED Provider Notes (Signed)
[facility] EMERGENCY DEPARTMENT Provider Note   CSN: [id number] Arrival date & time: 04/28/19  0708     History   Chief Complaint Chief Complaint  Patient presents with  . Fall  . Hip Pain    HPI Alicia Guerra is a 85 y.o. female.     Patient was found on the floor at the nursing home.  Unknown loss of consciousness  The history is provided by medical records and the nursing home.  Fall This is a new problem. The current episode started 1 to 2 hours ago. The problem occurs rarely. The problem has not changed since onset.Pertinent negatives include no chest pain. Nothing aggravates the symptoms. Nothing relieves the symptoms. She has tried nothing for the symptoms.  Hip Pain Pertinent negatives include no chest pain.    Past Medical History:  Diagnosis Date  . Anemia   . Arthritis   . History of blood transfusion    "today is the first time" (08/05/2014)  . Hypertension     Patient Active Problem List   Diagnosis Date Noted  . Paraspinal mass 02/11/2019  . Chronic idiopathic constipation 11/13/2018  . Benign hypertension with CKD (chronic kidney disease) stage IV ([place]) 11/13/2018  . Dementia ([place]) 07/31/2017  . Osteoporosis 07/31/2017  . CKD (chronic kidney disease) stage 4, GFR 15-29 ml/min (HCC) 05/12/2017  . Colon cancer ([place]) 10/23/2014  . Essential hypertension 08/22/2014  . Left femoral shaft fracture ([place]) 08/08/2014  . Anemia, iron deficiency 08/08/2014  . Knee fracture 08/05/2014    Past Surgical History:  Procedure Laterality Date  . APPENDECTOMY    . CATARACT EXTRACTION Left   . CHOLECYSTECTOMY    . COLONOSCOPY N/A 09/26/2014   Procedure: COLONOSCOPY;  Surgeon: [name], MD;  [facility];  Service: Endoscopy;  Laterality: N/A;  1030am  . CYSTECTOMY     "had cyst taken off lower back"  . ESOPHAGOGASTRODUODENOSCOPY N/A 08/22/2014   OMV:[id number] ring at the gastro junction/small HH  . ORIF FEMUR FRACTURE Left 08/07/2014   Procedure: OPEN REDUCTION INTERNAL FIXATION (ORIF)  LEFT FEMUR FRACTURE;  Surgeon: [name], MD;  Location: [place];  Service: Orthopedics;  Laterality: Left;  . TONSILLECTOMY       OB History    Gravida      Para      Term      Preterm      AB      Living  0     SAB      TAB      Ectopic      Multiple      Live Births               Home Medications    Prior to Admission medications   Medication Sig Start Date End Date Taking? Authorizing Provider  acetaminophen (TYLENOL) 325 MG tablet Take 650 mg by mouth every 6 (six) hours as needed.    [provider]  amLODipine (NORVASC) 5 MG tablet Take 5 mg by mouth daily.  01/22/19   [provider]  Ensure (ENSURE) Take 237 mLs by mouth daily. Due to weight loss    [provider]  ferrous sulfate 325 (65 FE) MG tablet Take 325 mg by mouth daily with breakfast.     [provider]  linaclotide (LINZESS) 145 MCG CAPS capsule Take 145 mcg by mouth daily before breakfast.    [provider]  metoprolol (LOPRESSOR) 50 MG tablet  Take 1 tablet (50 mg total) by mouth 2 (two) times daily. 08/25/14   Kathie Dike, MD  mirtazapine (REMERON) 15 MG tablet Take 7.5 mg by mouth at bedtime. 02/11/19   [provider]  NON FORMULARY Diet Type:  Regular, continue with thin liquids    [provider]  ondansetron (ZOFRAN) 4 MG tablet Take 4 mg by mouth 2 (two) times daily as needed.     [provider]  Polyethyl Glycol-Propyl Glycol (SYSTANE) 0.4-0.3 % GEL ophthalmic gel Place 1 application into both eyes 2 (two) times daily at 10 AM and 5 PM.    [provider]  sodium bicarbonate 650 MG tablet Take 650 mg by mouth 2 (two) times daily. 02/11/19   [provider]    Family History No family history on file.  Social History Social History   Tobacco Use  . Smoking status: Former Smoker    Packs/day: 0.25    Years: 16.00    Pack years: 4.00     Types: Cigarettes  . Smokeless tobacco: Never Used  Substance Use Topics  . Alcohol use: No  . Drug use: No     Allergies   Patient has no known allergies.   Review of Systems Review of Systems  Unable to perform ROS: Dementia  Cardiovascular: Negative for chest pain.     Physical Exam Updated Vital Signs BP (!) 191/61   Pulse 63   Temp 98.1 F (36.7 C) (Oral)   Wt 46.2 kg   SpO2 91%   BMI 20.56 kg/m   Physical Exam Vitals signs and nursing note reviewed.  Constitutional:      Appearance: She is well-developed.  HENT:     Head: Normocephalic.     Nose: Nose normal.     Mouth/Throat:     Mouth: Mucous membranes are moist.  Eyes:     General: No scleral icterus.    Conjunctiva/sclera: Conjunctivae normal.  Neck:     Musculoskeletal: Neck supple.     Thyroid: No thyromegaly.  Cardiovascular:     Rate and Rhythm: Normal rate and regular rhythm.     Heart sounds: No murmur. No friction rub. No gallop.   Pulmonary:     Breath sounds: No stridor. No wheezing or rales.  Chest:     Chest wall: No tenderness.  Abdominal:     General: There is no distension.     Tenderness: There is no abdominal tenderness. There is no rebound.  Musculoskeletal: Normal range of motion.     Comments: Minimal tenderness right hip.  Patient able to ambulate and put pressure on that leg  Lymphadenopathy:     Cervical: No cervical adenopathy.  Skin:    Findings: No erythema or rash.  Neurological:     Mental Status: She is alert.     Motor: No abnormal muscle tone.     Coordination: Coordination normal.     Comments: Alert and oriented to person only      ED Treatments / Results  Labs (all labs ordered are listed, but only abnormal results are displayed) Labs Reviewed  CBC WITH DIFFERENTIAL/PLATELET - Abnormal; Notable for the following components:      Result Value   Hemoglobin 10.6 (*)    HCT 35.4 (*)    MCH 25.7 (*)    MCHC 29.9 (*)    All other components within  normal limits  BASIC METABOLIC PANEL - Abnormal; Notable for the following components:   Glucose,  Bld 107 (*)    BUN 61 (*)    Creatinine, Ser 1.91 (*)    GFR calc non Af Amer 22 (*)    GFR calc Af Amer 26 (*)    All other components within normal limits    EKG None  Radiology Dg Hip Unilat W Or Wo Pelvis 2-3 Views Right  Result Date: 04/28/2019 CLINICAL DATA:  Pt was very confused and agitated during right hip xray. Pt could not verbalize reason for exam. Felt no body medical injector devices. Nurse notes:Pt brought in from [facility] center. Unwitnessed fall . Pt fell when getting OOB. EXAM: DG HIP (WITH OR WITHOUT PELVIS) 2-3V RIGHT COMPARISON:  08/05/2014 pelvis FINDINGS: There is no acute fracture or subluxation. Dense atherosclerotic calcification of the femoral arteries. Degenerative changes are seen in the LOWER lumbar spine. Coarse calcifications in the pelvis are consistent with calcified fibroids. Bowel sutures are noted in the LOWER pelvis. IMPRESSION: No evidence for acute abnormality. Electronically Signed   By: Nolon Nations M.D.   On: 04/28/2019 08:55    Procedures Procedures (including critical care time)  Medications Ordered in ED Medications - No data to display   Initial Impression / Assessment and Plan / ED Course  I have reviewed the triage vital signs and the nursing notes.  Pertinent labs & imaging results that were available during my care of the patient were reviewed by me and considered in my medical decision making (see chart for details).        X-ray right hip unremarkable.  Patient is at her normal baseline  She is able to ambulate without pain in the right leg.  She will be discharged home  Final Clinical Impressions(s) / ED Diagnoses   Final diagnoses:  Fall, initial encounter    ED Discharge Orders    None       Milton Ferguson, MD 04/28/19 1010

## 2019-04-28 NOTE — ED Notes (Signed)
Pt scooted to end of bed. Able to assist with pt back to bed. No signs of pain.

## 2019-04-28 NOTE — Discharge Instructions (Addendum)
Follow up if needed

## 2019-04-28 NOTE — ED Triage Notes (Signed)
Pt brought in from [facility]. Unwitnessed fall . Pt fell when getting OOB. Per EMS yelled out in pain with movement to right hip area and knot to Left FA. EMS administered 4mg  morphine IV en route

## 2019-04-29 ENCOUNTER — Encounter: Payer: Self-pay | Admitting: Adult Health

## 2019-04-29 ENCOUNTER — Non-Acute Institutional Stay (SKILLED_NURSING_FACILITY): Payer: Medicare HMO | Admitting: Adult Health

## 2019-04-29 DIAGNOSIS — W19XXXS Unspecified fall, sequela: Secondary | ICD-10-CM | POA: Diagnosis not present

## 2019-04-29 DIAGNOSIS — Y92129 Unspecified place in nursing home as the place of occurrence of the external cause: Secondary | ICD-10-CM | POA: Diagnosis not present

## 2019-04-29 DIAGNOSIS — F0390 Unspecified dementia without behavioral disturbance: Secondary | ICD-10-CM

## 2019-04-29 NOTE — Progress Notes (Signed)
Location:    [place] Room Number: 113/D Place of Service:  SNF (31)   CODE STATUS: DNR  No Known Allergies  Chief Complaint  Patient presents with  . Follow-up    ED Follow UP    HPI:  She had a fall at this facility. She had right hip and right arm pain. She was taken to the ED her x-ray and lab work were negative. There are no reports of fevers. There are no reports of pain today; she is eating and drinking. She continues to get out of bed daily .   Past Medical History:  Diagnosis Date  . Anemia   . Arthritis   . History of blood transfusion    "today is the first time" (08/05/2014)  . Hypertension     Past Surgical History:  Procedure Laterality Date  . APPENDECTOMY    . CATARACT EXTRACTION Left   . CHOLECYSTECTOMY    . COLONOSCOPY N/A 09/26/2014   Procedure: COLONOSCOPY;  Surgeon: [name], MD;  [facility];  Service: Endoscopy;  Laterality: N/A;  1030am  . CYSTECTOMY     "had cyst taken off lower back"  . ESOPHAGOGASTRODUODENOSCOPY N/A 08/22/2014   [id number] ring at the gastro junction/small HH  . ORIF FEMUR FRACTURE Left 08/07/2014   Procedure: OPEN REDUCTION INTERNAL FIXATION (ORIF)  LEFT FEMUR FRACTURE;  Surgeon: [name], MD;  Location: [facility];  Service: Orthopedics;  Laterality: Left;  . TONSILLECTOMY      Social History   Socioeconomic History  . Marital status: Widowed    Spouse name: Not on file  . Number of children: Not on file  . Years of education: Not on file  . Highest education level: Not on file  Occupational History  . Not on file  Social Needs  . Financial resource strain: Not hard at all  . Food insecurity    Worry: Never true    Inability: Never true  . Transportation needs    Medical: No    Non-medical: No  Tobacco Use  . Smoking status: Former Smoker    Packs/day: 0.25    Years: 16.00    Pack years: 4.00    Types: Cigarettes  . Smokeless tobacco: Never Used  Substance  and Sexual Activity  . Alcohol use: No  . Drug use: No  . Sexual activity: Never  Lifestyle  . Physical activity    Days per week: 0 days    Minutes per session: 0 min  . Stress: Not at all  Relationships  . Social Herbalist on phone: Never    Gets together: Once a week    Attends religious service: Never    Active member of club or organization: No    Attends meetings of clubs or organizations: Never    Relationship status: Widowed  . Intimate partner violence    Fear of current or ex partner: No    Emotionally abused: No    Physically abused: No    Forced sexual activity: No  Other Topics Concern  . Not on file  Social History Narrative  . Not on file   History reviewed. No pertinent family history.    VITAL SIGNS BP (!) 160/57   Pulse 70   Temp 98.8 F (37.1 C) (Oral)   Resp 18   Ht 4\' 11"  (1.499 m)   Wt 101 lb 12.8 oz (46.2 kg)   SpO2 95%  BMI 20.56 kg/m   Outpatient Encounter Medications as of 04/29/2019  Medication Sig  . acetaminophen (TYLENOL) 325 MG tablet Take 650 mg by mouth every 6 (six) hours as needed.  Marland Kitchen amLODipine (NORVASC) 5 MG tablet Take 5 mg by mouth daily.   . Ensure (ENSURE) Take 237 mLs by mouth daily. Due to weight loss  . ferrous sulfate 325 (65 FE) MG tablet Take 325 mg by mouth daily with breakfast.   . linaclotide (LINZESS) 145 MCG CAPS capsule Take 145 mcg by mouth daily before breakfast.  . metoprolol (LOPRESSOR) 50 MG tablet Take 1 tablet (50 mg total) by mouth 2 (two) times daily.  . mirtazapine (REMERON) 15 MG tablet Take 7.5 mg by mouth at bedtime.  . NON FORMULARY Diet Type:  Regular, continue with thin liquids  . ondansetron (ZOFRAN) 4 MG tablet Take 4 mg by mouth 2 (two) times daily as needed.   Vladimir Faster Glycol-Propyl Glycol (SYSTANE) 0.4-0.3 % GEL ophthalmic gel Place 1 application into both eyes 2 (two) times daily at 10 AM and 5 PM.  . sodium bicarbonate 650 MG tablet Take 650 mg by mouth 2 (two) times daily.    No facility-administered encounter medications on file as of 04/29/2019.      SIGNIFICANT DIAGNOSTIC EXAMS  TODAY;   04-28-19: right hip and pelvic x-ray: There is no acute fracture or subluxation. Dense atherosclerotic calcification of the femoral arteries. Degenerative changes are seen in the LOWER lumbar spine. Coarse calcifications in the pelvis are consistent with calcified fibroids. Bowel sutures are noted in the LOWER pelvis   LABS REVIEWED PREVIOUS;   06-10-18: wbc 6.0; hgb 11.4; hct 35.7; mcv 84.4 plt 300; glucose 143; bun 39; creat 1.75; k+ 4.5;na++ 136; ca 8.9 liver normal albumi 3.8  08-15-18: wbc 6.0; hgb 10.6; hct 35.3;mcv 85.;9 plt 349; glucose 101; bun 31; creat 1.78; k+ 4.4; na++ 139; ca 9.3  12-18-18: wbc 5.6; hgb 11.3 hct 37.2; mcv 84.5; plt 317; glucose 101; bun 46; creat 1.83; k+ 4.4; na++ 139; ca 9.2 liver normal albumin 4.3  02-07-19: wbc 4.9; hgb 10.8; hct 35.4; mcv 84.5; plt 302; glucose 102; bun 47; creat 2.30; k+ 4.1; na++ 136; ca 9.2; liver normal albumin 3.9 02-25-19: glucose 86; bun 42; creat 2.20 ;k+ 4.9; na++ 138; ca 8.7   TODAY;   04-28-19: wbc 6.1; hgb 10.6; hct 35.4; mcv 85.9; plt 336; glucose 107; bun 61; creat 1.91; k+ 4.7; na++ 138; ca 9.4    Review of Systems  Unable to perform ROS: Dementia (unable to participate )     Physical Exam Constitutional:      General: She is not in acute distress.    Appearance: She is well-developed. She is not diaphoretic.  Eyes:     Comments:  History of left cataract removal       Neck:     Musculoskeletal: Neck supple.     Thyroid: No thyromegaly.  Cardiovascular:     Rate and Rhythm: Normal rate and regular rhythm.     Pulses: Normal pulses.     Heart sounds: Normal heart sounds.  Pulmonary:     Effort: Pulmonary effort is normal. No respiratory distress.     Breath sounds: Normal breath sounds.  Abdominal:     General: Bowel sounds are normal. There is no distension.     Palpations: Abdomen is soft.      Tenderness: There is no abdominal tenderness.  Musculoskeletal:     Right lower  leg: No edema.     Left lower leg: No edema.     Comments: Is able to move all extremities  Uses wheelchair  History of left femur ORIF      Lymphadenopathy:     Cervical: No cervical adenopathy.  Skin:    General: Skin is warm and dry.  Neurological:     Mental Status: She is alert. Mental status is at baseline.  Psychiatric:        Mood and Affect: Mood normal.       ASSESSMENT/ PLAN:  TODAY   1. Unspecified dementia without behavioral disturbance 2. Fall at nursing home sequela  Will not make changes at this time; will continue to monitor her status; will make changes in the future as indicated   MD is aware of resident's narcotic use and is in agreement with current plan of care. We will attempt to wean resident as apropriate   Ok Edwards NP [facility]  Contact [phone] Monday through Friday 8am- 5pm  After hours call [phone]

## 2019-05-02 DIAGNOSIS — W19XXXA Unspecified fall, initial encounter: Secondary | ICD-10-CM | POA: Insufficient documentation

## 2019-05-09 ENCOUNTER — Non-Acute Institutional Stay (SKILLED_NURSING_FACILITY): Payer: Medicare HMO | Admitting: Adult Health

## 2019-05-09 DIAGNOSIS — F32A Depression, unspecified: Secondary | ICD-10-CM

## 2019-05-09 DIAGNOSIS — F329 Major depressive disorder, single episode, unspecified: Secondary | ICD-10-CM | POA: Diagnosis not present

## 2019-05-12 ENCOUNTER — Encounter: Payer: Self-pay | Admitting: Adult Health

## 2019-05-12 DIAGNOSIS — F329 Major depressive disorder, single episode, unspecified: Secondary | ICD-10-CM | POA: Insufficient documentation

## 2019-05-12 DIAGNOSIS — F32A Depression, unspecified: Secondary | ICD-10-CM | POA: Insufficient documentation

## 2019-05-12 NOTE — Progress Notes (Signed)
Location:   [facility] Room Number: 110 Place of Service:  SNF (31)   CODE STATUS: dnr  No Known Allergies  Chief Complaint  Patient presents with  . Acute Visit    psychoactive medication review    HPI:  We have come together to review her psychoactive medications. She is presently taking remeron 7.5 mg nightly. She has had recent fall without injury. There are no reports of agitation or anxiety. Her weight is without significant change; her appetite is stable. She may well benefit from a does reduction.   Past Medical History:  Diagnosis Date  . Anemia   . Arthritis   . History of blood transfusion    "today is the first time" (08/05/2014)  . Hypertension     Past Surgical History:  Procedure Laterality Date  . APPENDECTOMY    . CATARACT EXTRACTION Left   . CHOLECYSTECTOMY    . COLONOSCOPY N/A 09/26/2014   Procedure: COLONOSCOPY;  Surgeon: [name], MD;  [facility];  Service: Endoscopy;  Laterality: N/A;  1030am  . CYSTECTOMY     "had cyst taken off lower back"  . ESOPHAGOGASTRODUODENOSCOPY N/A 08/22/2014   [id number] ring at the gastro junction/small HH  . ORIF FEMUR FRACTURE Left 08/07/2014   Procedure: OPEN REDUCTION INTERNAL FIXATION (ORIF)  LEFT FEMUR FRACTURE;  Surgeon: [name], MD;  Location: [place];  Service: Orthopedics;  Laterality: Left;  . TONSILLECTOMY      Social History   Socioeconomic History  . Marital status: Widowed    Spouse name: Not on file  . Number of children: Not on file  . Years of education: Not on file  . Highest education level: Not on file  Occupational History  . Not on file  Social Needs  . Financial resource strain: Not hard at all  . Food insecurity    Worry: Never true    Inability: Never true  . Transportation needs    Medical: No    Non-medical: No  Tobacco Use  . Smoking status: Former Smoker    Packs/day: 0.25    Years: 16.00    Pack years: 4.00    Types: Cigarettes  .  Smokeless tobacco: Never Used  Substance and Sexual Activity  . Alcohol use: No  . Drug use: No  . Sexual activity: Never  Lifestyle  . Physical activity    Days per week: 0 days    Minutes per session: 0 min  . Stress: Not at all  Relationships  . Social Herbalist on phone: Never    Gets together: Once a week    Attends religious service: Never    Active member of club or organization: No    Attends meetings of clubs or organizations: Never    Relationship status: Widowed  . Intimate partner violence    Fear of current or ex partner: No    Emotionally abused: No    Physically abused: No    Forced sexual activity: No  Other Topics Concern  . Not on file  Social History Narrative  . Not on file   History reviewed. No pertinent family history.    VITAL SIGNS BP (!) 152/80   Pulse 75   Temp 98.1 F (36.7 C)   Ht 4\' 11"  (1.499 m)   Wt 101 lb (45.8 kg)   BMI 20.40 kg/m   Outpatient Encounter Medications as of 05/09/2019  Medication Sig  . acetaminophen (  TYLENOL) 325 MG tablet Take 650 mg by mouth every 6 (six) hours as needed.  Marland Kitchen amLODipine (NORVASC) 5 MG tablet Take 5 mg by mouth daily.   . Ensure (ENSURE) Take 237 mLs by mouth daily. Due to weight loss  . ferrous sulfate 325 (65 FE) MG tablet Take 325 mg by mouth daily with breakfast.   . linaclotide (LINZESS) 145 MCG CAPS capsule Take 145 mcg by mouth daily before breakfast.  . metoprolol (LOPRESSOR) 50 MG tablet Take 1 tablet (50 mg total) by mouth 2 (two) times daily.  . mirtazapine (REMERON) 15 MG tablet Take 7.5 mg by mouth at bedtime.  . NON FORMULARY Diet Type:  Regular, continue with thin liquids  . ondansetron (ZOFRAN) 4 MG tablet Take 4 mg by mouth 2 (two) times daily as needed.   Vladimir Faster Glycol-Propyl Glycol (SYSTANE) 0.4-0.3 % GEL ophthalmic gel Place 1 application into both eyes 2 (two) times daily at 10 AM and 5 PM.  . sodium bicarbonate 650 MG tablet Take 650 mg by mouth 2 (two) times  daily.   No facility-administered encounter medications on file as of 05/09/2019.      SIGNIFICANT DIAGNOSTIC EXAMS  PREVIOUS;   04-28-19: right hip and pelvic x-ray: There is no acute fracture or subluxation. Dense atherosclerotic calcification of the femoral arteries. Degenerative changes are seen in the LOWER lumbar spine. Coarse calcifications in the pelvis are consistent with calcified fibroids. Bowel sutures are noted in the LOWER pelvis  NO NEW EXAMS.    LABS REVIEWED PREVIOUS;   06-10-18: wbc 6.0; hgb 11.4; hct 35.7; mcv 84.4 plt 300; glucose 143; bun 39; creat 1.75; k+ 4.5;na++ 136; ca 8.9 liver normal albumi 3.8  08-15-18: wbc 6.0; hgb 10.6; hct 35.3;mcv 85.;9 plt 349; glucose 101; bun 31; creat 1.78; k+ 4.4; na++ 139; ca 9.3  12-18-18: wbc 5.6; hgb 11.3 hct 37.2; mcv 84.5; plt 317; glucose 101; bun 46; creat 1.83; k+ 4.4; na++ 139; ca 9.2 liver normal albumin 4.3  02-07-19: wbc 4.9; hgb 10.8; hct 35.4; mcv 84.5; plt 302; glucose 102; bun 47; creat 2.30; k+ 4.1; na++ 136; ca 9.2; liver normal albumin 3.9 02-25-19: glucose 86; bun 42; creat 2.20 ;k+ 4.9; na++ 138; ca 8.7  04-28-19: wbc 6.1; hgb 10.6; hct 35.4; mcv 85.9; plt 336; glucose 107; bun 61; creat 1.91; k+ 4.7; na++ 138; ca 9.4   NO NEW LABS.     Review of Systems  Unable to perform ROS: Dementia (unable to participate )     Physical Exam Constitutional:      General: She is not in acute distress.    Appearance: She is well-developed. She is not diaphoretic.  Eyes:     Comments: History of left cataract removal        Neck:     Musculoskeletal: Neck supple.     Thyroid: No thyromegaly.  Cardiovascular:     Rate and Rhythm: Normal rate and regular rhythm.     Pulses: Normal pulses.     Heart sounds: Normal heart sounds.  Pulmonary:     Effort: Pulmonary effort is normal. No respiratory distress.     Breath sounds: Normal breath sounds.  Abdominal:     General: Bowel sounds are normal. There is no distension.      Palpations: Abdomen is soft.     Tenderness: There is no abdominal tenderness.  Musculoskeletal:     Right lower leg: No edema.     Left lower leg:  No edema.     Comments: Is able to move all extremities  Uses wheelchair  History of left femur ORIF       Lymphadenopathy:     Cervical: No cervical adenopathy.  Skin:    General: Skin is warm and dry.  Neurological:     Mental Status: She is alert. Mental status is at baseline.  Psychiatric:        Mood and Affect: Mood normal.      ASSESSMENT/ PLAN:  TODAY;   1. Chronic depression:   She is stable Will change remeron to every other night through 05-14-19 then stop Will monitor her status.    MD is aware of resident's narcotic use and is in agreement with current plan of care. We will attempt to wean resident as appropriate.  Ok Edwards NP [facility]  Contact [phone] Monday through Friday 8am- 5pm  After hours call [phone]

## 2019-05-16 ENCOUNTER — Encounter: Payer: Self-pay | Admitting: Adult Health

## 2019-05-16 ENCOUNTER — Non-Acute Institutional Stay (SKILLED_NURSING_FACILITY): Payer: Medicare HMO | Admitting: Adult Health

## 2019-05-16 DIAGNOSIS — R443 Hallucinations, unspecified: Secondary | ICD-10-CM

## 2019-05-16 DIAGNOSIS — K5904 Chronic idiopathic constipation: Secondary | ICD-10-CM

## 2019-05-16 NOTE — Progress Notes (Signed)
Location:    [place] Room Number: 113/D Place of Service:  SNF (31)   CODE STATUS: DNR  No Known Allergies  Chief Complaint  Patient presents with  . Acute Visit    Hallucinations     HPI:  Staff reports that she is seeing bugs on the floor and has been trying to swat them away. The staff has tried to put a night light on without success. This is not causing her any distress at this time. She is now off remeron.  There are no changes in her appetite; no changes in weight. Staff reports that she has been having diarrhea and is taking linzess 72 mcg daily .   Past Medical History:  Diagnosis Date  . Anemia   . Arthritis   . History of blood transfusion    "today is the first time" (08/05/2014)  . Hypertension     Past Surgical History:  Procedure Laterality Date  . APPENDECTOMY    . CATARACT EXTRACTION Left   . CHOLECYSTECTOMY    . COLONOSCOPY N/A 09/26/2014   Procedure: COLONOSCOPY;  Surgeon: [name], MD;  [facility];  Service: Endoscopy;  Laterality: N/A;  1030am  . CYSTECTOMY     "had cyst taken off lower back"  . ESOPHAGOGASTRODUODENOSCOPY N/A 08/22/2014   [id number] ring at the gastro junction/small HH  . ORIF FEMUR FRACTURE Left 08/07/2014   Procedure: OPEN REDUCTION INTERNAL FIXATION (ORIF)  LEFT FEMUR FRACTURE;  Surgeon: [name], MD;  Location: [place];  Service: Orthopedics;  Laterality: Left;  . TONSILLECTOMY      Social History   Socioeconomic History  . Marital status: Widowed    Spouse name: Not on file  . Number of children: Not on file  . Years of education: Not on file  . Highest education level: Not on file  Occupational History  . Not on file  Social Needs  . Financial resource strain: Not hard at all  . Food insecurity    Worry: Never true    Inability: Never true  . Transportation needs    Medical: No    Non-medical: No  Tobacco Use  . Smoking status: Former Smoker    Packs/day: 0.25     Years: 16.00    Pack years: 4.00    Types: Cigarettes  . Smokeless tobacco: Never Used  Substance and Sexual Activity  . Alcohol use: No  . Drug use: No  . Sexual activity: Never  Lifestyle  . Physical activity    Days per week: 0 days    Minutes per session: 0 min  . Stress: Not at all  Relationships  . Social Herbalist on phone: Never    Gets together: Once a week    Attends religious service: Never    Active member of club or organization: No    Attends meetings of clubs or organizations: Never    Relationship status: Widowed  . Intimate partner violence    Fear of current or ex partner: No    Emotionally abused: No    Physically abused: No    Forced sexual activity: No  Other Topics Concern  . Not on file  Social History Narrative  . Not on file   History reviewed. No pertinent family history.    VITAL SIGNS BP 122/68   Pulse 61   Temp 98.6 F (37 C) (Oral)   Resp 18   Ht 4\' 11"  (  1.499 m)   Wt 103 lb 12.8 oz (47.1 kg)   SpO2 95%   BMI 20.97 kg/m   Outpatient Encounter Medications as of 05/16/2019  Medication Sig  . acetaminophen (TYLENOL) 325 MG tablet Take 650 mg by mouth every 6 (six) hours as needed.  Marland Kitchen amLODipine (NORVASC) 5 MG tablet Take 5 mg by mouth daily.   . Ensure (ENSURE) Take 237 mLs by mouth daily. Due to weight loss  . ferrous sulfate 325 (65 FE) MG tablet Take 325 mg by mouth daily with breakfast.   . metoprolol (LOPRESSOR) 50 MG tablet Take 1 tablet (50 mg total) by mouth 2 (two) times daily.  . NON FORMULARY Diet Type:  Regular, continue with thin liquids  . ondansetron (ZOFRAN) 4 MG tablet Take 4 mg by mouth 2 (two) times daily as needed.   Vladimir Faster Glycol-Propyl Glycol (SYSTANE) 0.4-0.3 % GEL ophthalmic gel Place 1 application into both eyes 2 (two) times daily at 10 AM and 5 PM.  . sodium bicarbonate 650 MG tablet Take 650 mg by mouth 2 (two) times daily.  . [DISCONTINUED] linaclotide (LINZESS) 145 MCG CAPS capsule  Take 145 mcg by mouth daily before breakfast.  . [DISCONTINUED] mirtazapine (REMERON) 15 MG tablet Take 7.5 mg by mouth at bedtime.   No facility-administered encounter medications on file as of 05/16/2019.      SIGNIFICANT DIAGNOSTIC EXAMS   PREVIOUS;   04-28-19: right hip and pelvic x-ray: There is no acute fracture or subluxation. Dense atherosclerotic calcification of the femoral arteries. Degenerative changes are seen in the LOWER lumbar spine. Coarse calcifications in the pelvis are consistent with calcified fibroids. Bowel sutures are noted in the LOWER pelvis  NO NEW EXAMS.    LABS REVIEWED PREVIOUS;   06-10-18: wbc 6.0; hgb 11.4; hct 35.7; mcv 84.4 plt 300; glucose 143; bun 39; creat 1.75; k+ 4.5;na++ 136; ca 8.9 liver normal albumi 3.8  08-15-18: wbc 6.0; hgb 10.6; hct 35.3;mcv 85.;9 plt 349; glucose 101; bun 31; creat 1.78; k+ 4.4; na++ 139; ca 9.3  12-18-18: wbc 5.6; hgb 11.3 hct 37.2; mcv 84.5; plt 317; glucose 101; bun 46; creat 1.83; k+ 4.4; na++ 139; ca 9.2 liver normal albumin 4.3  02-07-19: wbc 4.9; hgb 10.8; hct 35.4; mcv 84.5; plt 302; glucose 102; bun 47; creat 2.30; k+ 4.1; na++ 136; ca 9.2; liver normal albumin 3.9 02-25-19: glucose 86; bun 42; creat 2.20 ;k+ 4.9; na++ 138; ca 8.7  04-28-19: wbc 6.1; hgb 10.6; hct 35.4; mcv 85.9; plt 336; glucose 107; bun 61; creat 1.91; k+ 4.7; na++ 138; ca 9.4   NO NEW LABS.    Review of Systems  Unable to perform ROS: Dementia (unable to participate )    Physical Exam Constitutional:      General: She is not in acute distress.    Appearance: She is well-developed. She is not diaphoretic.  Eyes:     Comments: History of left cataract removal.   Neck:     Musculoskeletal: Neck supple.     Thyroid: No thyromegaly.  Cardiovascular:     Rate and Rhythm: Normal rate and regular rhythm.     Pulses: Normal pulses.     Heart sounds: Normal heart sounds.  Pulmonary:     Effort: Pulmonary effort is normal. No respiratory  distress.     Breath sounds: Normal breath sounds.  Abdominal:     General: Bowel sounds are normal. There is no distension.     Palpations: Abdomen  is soft.     Tenderness: There is no abdominal tenderness.  Musculoskeletal:     Right lower leg: No edema.     Left lower leg: No edema.     Comments: Is able to move all extremities  Uses wheelchair  History of left femur ORIF        Lymphadenopathy:     Cervical: No cervical adenopathy.  Skin:    General: Skin is warm and dry.  Neurological:     Mental Status: She is alert. Mental status is at baseline.  Psychiatric:        Mood and Affect: Mood normal.      ASSESSMENT/ PLAN:  TODAY;   1. Chronic idiopathic constipation 2. Hallucination  Will stop linzess Will get cbc; cmp tsh Will continue to monitor her status. .     MD is aware of resident's narcotic use and is in agreement with current plan of care. We will attempt to wean resident as appropriate.  Ok Edwards NP [facility]  Contact [phone] Monday through Friday 8am- 5pm  After hours call [phone]

## 2019-05-17 ENCOUNTER — Non-Acute Institutional Stay (SKILLED_NURSING_FACILITY): Payer: Medicare HMO | Admitting: Adult Health

## 2019-05-17 ENCOUNTER — Encounter (HOSPITAL_COMMUNITY)
Admission: RE | Admit: 2019-05-17 | Discharge: 2019-05-17 | Disposition: A | Discharge status: Home / Self Care | Payer: Medicare HMO | Source: Skilled Nursing Facility | Attending: Internal Medicine | Admitting: Internal Medicine

## 2019-05-17 ENCOUNTER — Encounter: Payer: Self-pay | Admitting: Adult Health

## 2019-05-17 DIAGNOSIS — F0390 Unspecified dementia without behavioral disturbance: Secondary | ICD-10-CM | POA: Diagnosis not present

## 2019-05-17 DIAGNOSIS — N189 Chronic kidney disease, unspecified: Secondary | ICD-10-CM | POA: Diagnosis not present

## 2019-05-17 DIAGNOSIS — I129 Hypertensive chronic kidney disease with stage 1 through stage 4 chronic kidney disease, or unspecified chronic kidney disease: Secondary | ICD-10-CM | POA: Diagnosis not present

## 2019-05-17 DIAGNOSIS — E876 Hypokalemia: Secondary | ICD-10-CM | POA: Diagnosis not present

## 2019-05-17 DIAGNOSIS — N184 Chronic kidney disease, stage 4 (severe): Secondary | ICD-10-CM

## 2019-05-17 DIAGNOSIS — C189 Malignant neoplasm of colon, unspecified: Secondary | ICD-10-CM

## 2019-05-17 DIAGNOSIS — K5900 Constipation, unspecified: Secondary | ICD-10-CM | POA: Insufficient documentation

## 2019-05-17 LAB — COMPREHENSIVE METABOLIC PANEL
ALT: 11 U/L (ref 0–44)
AST: 19 U/L (ref 15–41)
Albumin: 4 g/dL (ref 3.5–5.0)
Alkaline Phosphatase: 86 U/L (ref 38–126)
Anion gap: 9 (ref 5–15)
BUN: 55 mg/dL — ABNORMAL HIGH (ref 8–23)
CO2: 22 mmol/L (ref 22–32)
Calcium: 9.1 mg/dL (ref 8.9–10.3)
Chloride: 105 mmol/L (ref 98–111)
Creatinine, Ser: 2.14 mg/dL — ABNORMAL HIGH (ref 0.44–1.00)
GFR calc Af Amer: 22 mL/min — ABNORMAL LOW (ref >60)
GFR calc non Af Amer: 19 mL/min — ABNORMAL LOW (ref >60)
Glucose, Bld: 95 mg/dL (ref 70–99)
Potassium: 5.1 mmol/L (ref 3.5–5.1)
Sodium: 136 mmol/L (ref 135–145)
Total Bilirubin: 0.3 mg/dL (ref 0.3–1.2)
Total Protein: 6.8 g/dL (ref 6.5–8.1)

## 2019-05-17 LAB — TSH: TSH: 2.74 u[IU]/mL (ref 0.350–4.500)

## 2019-05-17 NOTE — Progress Notes (Signed)
Location:    [place] Room Number: 113/D Place of Service:  SNF (31)   CODE STATUS: DNR  No Known Allergies  Chief Complaint  Patient presents with  . Medical Management of Chronic Issues         Benign hypertension with CKD (chronic kidney disease) stage IV:   Colon cancer: without change will monitor  Unspecified type dementia without behavioral disturbance:     HPI:  She is a 85 year old long term resident of this facility being seen for the management of her chronic illnesses: hypertension; colon cancer; dementia. She has been having hallucinations; which are not causing her distress at this time. No reports of changes in her appetite; no reports of uncontrolled pain.   Past Medical History:  Diagnosis Date  . Anemia   . Arthritis   . History of blood transfusion    "today is the first time" (08/05/2014)  . Hypertension     Past Surgical History:  Procedure Laterality Date  . APPENDECTOMY    . CATARACT EXTRACTION Left   . CHOLECYSTECTOMY    . COLONOSCOPY N/A 09/26/2014   Procedure: COLONOSCOPY;  Surgeon: [name], MD;  [facility];  Service: Endoscopy;  Laterality: N/A;  1030am  . CYSTECTOMY     "had cyst taken off lower back"  . ESOPHAGOGASTRODUODENOSCOPY N/A 08/22/2014   SWF:[id number] ring at the gastro junction/small HH  . ORIF FEMUR FRACTURE Left 08/07/2014   Procedure: OPEN REDUCTION INTERNAL FIXATION (ORIF)  LEFT FEMUR FRACTURE;  Surgeon: [name], MD;  Location: [place];  Service: Orthopedics;  Laterality: Left;  . TONSILLECTOMY      Social History   Socioeconomic History  . Marital status: Widowed    Spouse name: Not on file  . Number of children: Not on file  . Years of education: Not on file  . Highest education level: Not on file  Occupational History  . Not on file  Social Needs  . Financial resource strain: Not hard at all  . Food insecurity    Worry: Never true    Inability: Never true  .  Transportation needs    Medical: No    Non-medical: No  Tobacco Use  . Smoking status: Former Smoker    Packs/day: 0.25    Years: 16.00    Pack years: 4.00    Types: Cigarettes  . Smokeless tobacco: Never Used  Substance and Sexual Activity  . Alcohol use: No  . Drug use: No  . Sexual activity: Never  Lifestyle  . Physical activity    Days per week: 0 days    Minutes per session: 0 min  . Stress: Not at all  Relationships  . Social Herbalist on phone: Never    Gets together: Once a week    Attends religious service: Never    Active member of club or organization: No    Attends meetings of clubs or organizations: Never    Relationship status: Widowed  . Intimate partner violence    Fear of current or ex partner: No    Emotionally abused: No    Physically abused: No    Forced sexual activity: No  Other Topics Concern  . Not on file  Social History Narrative  . Not on file   History reviewed. No pertinent family history.    VITAL SIGNS BP (!) 144/66   Pulse (!) 54   Temp 98.6 F (37  C) (Oral)   Resp 14   Ht 4\' 11"  (1.499 m)   Wt 103 lb 12.8 oz (47.1 kg)   SpO2 95%   BMI 20.97 kg/m   Outpatient Encounter Medications as of 05/17/2019  Medication Sig  . acetaminophen (TYLENOL) 325 MG tablet Take 650 mg by mouth every 6 (six) hours as needed.  Marland Kitchen amLODipine (NORVASC) 5 MG tablet Take 5 mg by mouth daily.   . Ensure (ENSURE) Take 237 mLs by mouth daily. Due to weight loss  . ferrous sulfate 325 (65 FE) MG tablet Take 325 mg by mouth daily with breakfast.   . metoprolol (LOPRESSOR) 50 MG tablet Take 1 tablet (50 mg total) by mouth 2 (two) times daily.  . NON FORMULARY Diet Type:  Regular, continue with thin liquids  . ondansetron (ZOFRAN) 4 MG tablet Take 4 mg by mouth 2 (two) times daily as needed.   Vladimir Faster Glycol-Propyl Glycol (SYSTANE) 0.4-0.3 % GEL ophthalmic gel Place 1 application into both eyes 2 (two) times daily at 10 AM and 5 PM.  . sodium  bicarbonate 650 MG tablet Take 650 mg by mouth 2 (two) times daily.   No facility-administered encounter medications on file as of 05/17/2019.      SIGNIFICANT DIAGNOSTIC EXAMS   PREVIOUS;   04-28-19: right hip and pelvic x-ray: There is no acute fracture or subluxation. Dense atherosclerotic calcification of the femoral arteries. Degenerative changes are seen in the LOWER lumbar spine. Coarse calcifications in the pelvis are consistent with calcified fibroids. Bowel sutures are noted in the LOWER pelvis  NO NEW EXAMS.    LABS REVIEWED PREVIOUS;   06-10-18: wbc 6.0; hgb 11.4; hct 35.7; mcv 84.4 plt 300; glucose 143; bun 39; creat 1.75; k+ 4.5;na++ 136; ca 8.9 liver normal albumi 3.8  08-15-18: wbc 6.0; hgb 10.6; hct 35.3;mcv 85.;9 plt 349; glucose 101; bun 31; creat 1.78; k+ 4.4; na++ 139; ca 9.3  12-18-18: wbc 5.6; hgb 11.3 hct 37.2; mcv 84.5; plt 317; glucose 101; bun 46; creat 1.83; k+ 4.4; na++ 139; ca 9.2 liver normal albumin 4.3  02-07-19: wbc 4.9; hgb 10.8; hct 35.4; mcv 84.5; plt 302; glucose 102; bun 47; creat 2.30; k+ 4.1; na++ 136; ca 9.2; liver normal albumin 3.9 02-25-19: glucose 86; bun 42; creat 2.20 ;k+ 4.9; na++ 138; ca 8.7  04-28-19: wbc 6.1; hgb 10.6; hct 35.4; mcv 85.9; plt 336; glucose 107; bun 61; creat 1.91; k+ 4.7; na++ 138; ca 9.4   NO NEW LABS.   Review of Systems  Unable to perform ROS: Dementia (unable to participate )    Physical Exam Constitutional:      General: She is not in acute distress.    Appearance: She is well-developed. She is not diaphoretic.  Eyes:     Comments: History of left cataract removal.    Neck:     Musculoskeletal: Neck supple.     Thyroid: No thyromegaly.  Cardiovascular:     Rate and Rhythm: Normal rate and regular rhythm.     Pulses: Normal pulses.     Heart sounds: Normal heart sounds.  Pulmonary:     Effort: Pulmonary effort is normal. No respiratory distress.     Breath sounds: Normal breath sounds.  Abdominal:      General: Bowel sounds are normal. There is no distension.     Palpations: Abdomen is soft.     Tenderness: There is no abdominal tenderness.  Musculoskeletal:     Right lower leg:  No edema.     Left lower leg: No edema.     Comments:  Is able to move all extremities  Uses wheelchair  History of left femur ORIF         Lymphadenopathy:     Cervical: No cervical adenopathy.  Skin:    General: Skin is warm and dry.  Neurological:     Mental Status: She is alert. Mental status is at baseline.  Psychiatric:        Mood and Affect: Mood normal.      ASSESSMENT/ PLAN:  TODAY:   1. Benign hypertension with CKD (chronic kidney disease) stage IV: : is table b/p 144/66 will continue lopressor 50 mg twice daily and norvasc 5 mg daily   2. Colon cancer: without change will monitor  3.  Unspecified type dementia without behavioral disturbance: without change weight is 103 (previous 103 101 102) pounds; will continue to monitor her status.    PREVIOUS  4.  Left femoral shaft fracture: history of left femur ORIF: is stable has tylenol as needed for pain will monitor   5. Chronic idiopathic constipation: is stable will continue linzess 145 mcg daily   6. Benign hypertension with CKD (chronic kidney disease) stage IV is stable bun 42; creat 2.20 will monitor  7. Unspecified iron deficiency anemia: is stable hgb 10.8; will continue iron daily     MD is aware of resident's narcotic use and is in agreement with current plan of care. We will attempt to wean resident as appropriate.  Ok Edwards NP [facility]  Contact [phone] Monday through Friday 8am- 5pm  After hours call [phone]

## 2019-05-19 DIAGNOSIS — R443 Hallucinations, unspecified: Secondary | ICD-10-CM | POA: Insufficient documentation

## 2019-05-25 ENCOUNTER — Emergency Department (HOSPITAL_COMMUNITY): Payer: Medicare HMO

## 2019-05-25 ENCOUNTER — Emergency Department (HOSPITAL_COMMUNITY)
Admission: EM | Admit: 2019-05-25 | Discharge: 2019-05-25 | Disposition: A | Discharge status: Home / Self Care | Payer: Medicare HMO | Attending: Emergency Medicine | Admitting: Emergency Medicine

## 2019-05-25 ENCOUNTER — Encounter (HOSPITAL_COMMUNITY): Payer: Self-pay | Admitting: Emergency Medicine

## 2019-05-25 ENCOUNTER — Other Ambulatory Visit: Payer: Self-pay

## 2019-05-25 DIAGNOSIS — F0390 Unspecified dementia without behavioral disturbance: Secondary | ICD-10-CM | POA: Diagnosis not present

## 2019-05-25 DIAGNOSIS — Z79899 Other long term (current) drug therapy: Secondary | ICD-10-CM | POA: Insufficient documentation

## 2019-05-25 DIAGNOSIS — Z87891 Personal history of nicotine dependence: Secondary | ICD-10-CM | POA: Diagnosis not present

## 2019-05-25 DIAGNOSIS — R1030 Lower abdominal pain, unspecified: Secondary | ICD-10-CM | POA: Insufficient documentation

## 2019-05-25 DIAGNOSIS — R1032 Left lower quadrant pain: Secondary | ICD-10-CM | POA: Diagnosis not present

## 2019-05-25 DIAGNOSIS — R1084 Generalized abdominal pain: Secondary | ICD-10-CM | POA: Diagnosis not present

## 2019-05-25 DIAGNOSIS — N184 Chronic kidney disease, stage 4 (severe): Secondary | ICD-10-CM | POA: Diagnosis not present

## 2019-05-25 DIAGNOSIS — I129 Hypertensive chronic kidney disease with stage 1 through stage 4 chronic kidney disease, or unspecified chronic kidney disease: Secondary | ICD-10-CM | POA: Diagnosis not present

## 2019-05-25 DIAGNOSIS — Z85038 Personal history of other malignant neoplasm of large intestine: Secondary | ICD-10-CM | POA: Diagnosis not present

## 2019-05-25 LAB — URINALYSIS, ROUTINE W REFLEX MICROSCOPIC
Bacteria, UA: NONE SEEN
Bilirubin Urine: NEGATIVE
Glucose, UA: NEGATIVE mg/dL
Hgb urine dipstick: NEGATIVE
Ketones, ur: NEGATIVE mg/dL
Leukocytes,Ua: NEGATIVE
Nitrite: NEGATIVE
Protein, ur: 30 mg/dL — AB
Specific Gravity, Urine: 1.005 (ref 1.005–1.030)
pH: 8 (ref 5.0–8.0)

## 2019-05-25 LAB — COMPREHENSIVE METABOLIC PANEL
ALT: 12 U/L (ref 0–44)
AST: 17 U/L (ref 15–41)
Albumin: 4 g/dL (ref 3.5–5.0)
Alkaline Phosphatase: 82 U/L (ref 38–126)
Anion gap: 8 (ref 5–15)
BUN: 52 mg/dL — ABNORMAL HIGH (ref 8–23)
CO2: 26 mmol/L (ref 22–32)
Calcium: 9.5 mg/dL (ref 8.9–10.3)
Chloride: 105 mmol/L (ref 98–111)
Creatinine, Ser: 1.74 mg/dL — ABNORMAL HIGH (ref 0.44–1.00)
GFR calc Af Amer: 29 mL/min — ABNORMAL LOW (ref >60)
GFR calc non Af Amer: 25 mL/min — ABNORMAL LOW (ref >60)
Glucose, Bld: 102 mg/dL — ABNORMAL HIGH (ref 70–99)
Potassium: 4.6 mmol/L (ref 3.5–5.1)
Sodium: 139 mmol/L (ref 135–145)
Total Bilirubin: 0.6 mg/dL (ref 0.3–1.2)
Total Protein: 7.3 g/dL (ref 6.5–8.1)

## 2019-05-25 LAB — CBC WITH DIFFERENTIAL/PLATELET
Abs Immature Granulocytes: 0.02 10*3/uL (ref 0.00–0.07)
Basophils Absolute: 0 10*3/uL (ref 0.0–0.1)
Basophils Relative: 1 %
Eosinophils Absolute: 0.1 10*3/uL (ref 0.0–0.5)
Eosinophils Relative: 3 %
HCT: 35.6 % — ABNORMAL LOW (ref 36.0–46.0)
Hemoglobin: 10.8 g/dL — ABNORMAL LOW (ref 12.0–15.0)
Immature Granulocytes: 0 %
Lymphocytes Relative: 31 %
Lymphs Abs: 1.8 10*3/uL (ref 0.7–4.0)
MCH: 26 pg (ref 26.0–34.0)
MCHC: 30.3 g/dL (ref 30.0–36.0)
MCV: 85.6 fL (ref 80.0–100.0)
Monocytes Absolute: 0.7 10*3/uL (ref 0.1–1.0)
Monocytes Relative: 12 %
Neutro Abs: 3 10*3/uL (ref 1.7–7.7)
Neutrophils Relative %: 53 %
Platelets: 318 10*3/uL (ref 150–400)
RBC: 4.16 MIL/uL (ref 3.87–5.11)
RDW: 13.2 % (ref 11.5–15.5)
WBC: 5.7 10*3/uL (ref 4.0–10.5)
nRBC: 0 % (ref 0.0–0.2)

## 2019-05-25 MED ORDER — MORPHINE SULFATE (PF) 2 MG/ML IV SOLN
2.0000 mg | Freq: Once | INTRAVENOUS | Status: AC
  Administered 2019-05-25: 2 mg via INTRAVENOUS
  Filled 2019-05-25: qty 1

## 2019-05-25 MED ORDER — ONDANSETRON HCL 4 MG/2ML IJ SOLN
4.0000 mg | Freq: Once | INTRAMUSCULAR | Status: AC
  Administered 2019-05-25: 09:00:00 4 mg via INTRAVENOUS
  Filled 2019-05-25: qty 2

## 2019-05-25 NOTE — ED Triage Notes (Signed)
Pt brought over from [facility] center for evaluation of LLQ pain. Pt has hx of constipation and was taken off Linzess a few days ago. Unknown last bm.

## 2019-05-25 NOTE — Discharge Instructions (Addendum)
Take Tylenol for pain and follow-up with your doctor next week

## 2019-05-25 NOTE — ED Provider Notes (Signed)
[facility] EMERGENCY DEPARTMENT Provider Note   CSN: [id number] Arrival date & time: 05/25/19  0746     History   Chief Complaint Chief Complaint  Patient presents with   Abdominal Pain    HPI Chong SRIYA KROEZE is a 85 y.o. female.     Patient was sent from nursing home today because of abdominal pain.  Patient has severe dementia  The history is provided by the patient and medical records. No language interpreter was used.  Abdominal Pain Pain location:  Generalized Pain quality: aching   Pain radiates to:  Does not radiate Pain severity:  Mild Onset quality:  Sudden Timing:  Constant Progression:  Improving Chronicity:  Recurrent Context: not awakening from sleep   Relieved by:  Nothing Associated symptoms: no chest pain, no cough, no diarrhea, no fatigue and no hematuria     Past Medical History:  Diagnosis Date   Anemia    Arthritis    History of blood transfusion    "today is the first time" (08/05/2014)   Hypertension     Patient Active Problem List   Diagnosis Date Noted   Hallucination 05/19/2019   Chronic depression 05/12/2019   Fall at nursing home 05/02/2019   Paraspinal mass 02/11/2019   Chronic idiopathic constipation 11/13/2018   Benign hypertension with CKD (chronic kidney disease) stage IV ([place]) 11/13/2018   Dementia ([place]) 07/31/2017   Osteoporosis 07/31/2017   CKD (chronic kidney disease) stage 4, GFR 15-29 ml/min ([place]) 05/12/2017   Colon cancer ([place]) 10/23/2014   Essential hypertension 08/22/2014   Left femoral shaft fracture ([place]) 08/08/2014   Anemia, iron deficiency 08/08/2014   Knee fracture 08/05/2014    Past Surgical History:  Procedure Laterality Date   APPENDECTOMY     CATARACT EXTRACTION Left    CHOLECYSTECTOMY     COLONOSCOPY N/A 09/26/2014   Procedure: COLONOSCOPY;  Surgeon: [name], MD;  [facility];  Service: Endoscopy;  Laterality: N/A;  1030am   CYSTECTOMY     "had cyst  taken off lower back"   ESOPHAGOGASTRODUODENOSCOPY N/A 08/22/2014   VEL:[id number] ring at the gastro junction/small HH   ORIF FEMUR FRACTURE Left 08/07/2014   Procedure: OPEN REDUCTION INTERNAL FIXATION (ORIF)  LEFT FEMUR FRACTURE;  Surgeon: [name], MD;  Location: [place];  Service: Orthopedics;  Laterality: Left;   TONSILLECTOMY       OB History    Gravida      Para      Term      Preterm      AB      Living  0     SAB      TAB      Ectopic      Multiple      Live Births               Home Medications    Prior to Admission medications   Medication Sig Start Date End Date Taking? Authorizing Provider  acetaminophen (TYLENOL) 325 MG tablet Take 650 mg by mouth every 6 (six) hours as needed.    [provider]  amLODipine (NORVASC) 5 MG tablet Take 5 mg by mouth daily.  01/22/19   [provider]  Ensure (ENSURE) Take 237 mLs by mouth daily. Due to weight loss    [provider]  ferrous sulfate 325 (65 FE) MG tablet Take 325 mg by mouth daily with breakfast.     [provider]  metoprolol (  LOPRESSOR) 50 MG tablet Take 1 tablet (50 mg total) by mouth 2 (two) times daily. 08/25/14   Kathie Dike, MD  NON FORMULARY Diet Type:  Regular, continue with thin liquids    [provider]  ondansetron (ZOFRAN) 4 MG tablet Take 4 mg by mouth 2 (two) times daily as needed.     [provider]  Polyethyl Glycol-Propyl Glycol (SYSTANE) 0.4-0.3 % GEL ophthalmic gel Place 1 application into both eyes 2 (two) times daily at 10 AM and 5 PM.    [provider]  sodium bicarbonate 650 MG tablet Take 650 mg by mouth 2 (two) times daily. 02/11/19   [provider]    Family History History reviewed. No pertinent family history.  Social History Social History   Tobacco Use   Smoking status: Former Smoker    Packs/day: 0.25    Years: 16.00    Pack years: 4.00    Types: Cigarettes   Smokeless  tobacco: Never Used  Substance Use Topics   Alcohol use: No   Drug use: No     Allergies   Patient has no known allergies.   Review of Systems Review of Systems  Constitutional: Negative for appetite change and fatigue.  HENT: Negative for congestion, ear discharge and sinus pressure.   Eyes: Negative for discharge.  Respiratory: Negative for cough.   Cardiovascular: Negative for chest pain.  Gastrointestinal: Positive for abdominal pain. Negative for diarrhea.  Genitourinary: Negative for frequency and hematuria.  Musculoskeletal: Negative for back pain.  Skin: Negative for rash.  Neurological: Negative for seizures and headaches.  Psychiatric/Behavioral: Negative for hallucinations.     Physical Exam Updated Vital Signs BP (!) 192/58    Pulse 63    Temp (!) 97.5 F (36.4 C) (Oral)    Resp 18    Ht 4\' 11"  (1.499 m)    Wt 45.4 kg    SpO2 96%    BMI 20.20 kg/m   Physical Exam Vitals signs and nursing note reviewed.  Constitutional:      Appearance: She is well-developed.  HENT:     Head: Normocephalic.     Nose: Nose normal.  Eyes:     General: No scleral icterus.    Conjunctiva/sclera: Conjunctivae normal.  Neck:     Musculoskeletal: Neck supple.     Thyroid: No thyromegaly.  Cardiovascular:     Rate and Rhythm: Normal rate and regular rhythm.     Heart sounds: No murmur. No friction rub. No gallop.   Pulmonary:     Breath sounds: No stridor. No wheezing or rales.  Chest:     Chest wall: No tenderness.  Abdominal:     General: There is no distension.     Tenderness: There is no abdominal tenderness. There is no rebound.  Musculoskeletal: Normal range of motion.  Lymphadenopathy:     Cervical: No cervical adenopathy.  Skin:    Findings: No erythema or rash.  Neurological:     Mental Status: She is oriented to person, place, and time.     Motor: No abnormal muscle tone.     Coordination: Coordination normal.  Psychiatric:        Behavior: Behavior  normal.      ED Treatments / Results  Labs (all labs ordered are listed, but only abnormal results are displayed) Labs Reviewed  CBC WITH DIFFERENTIAL/PLATELET - Abnormal; Notable for the following components:      Result Value   Hemoglobin 10.8 (*)  HCT 35.6 (*)    All other components within normal limits  COMPREHENSIVE METABOLIC PANEL - Abnormal; Notable for the following components:   Glucose, Bld 102 (*)    BUN 52 (*)    Creatinine, Ser 1.74 (*)    GFR calc non Af Amer 25 (*)    GFR calc Af Amer 29 (*)    All other components within normal limits  URINALYSIS, ROUTINE W REFLEX MICROSCOPIC - Abnormal; Notable for the following components:   Color, Urine STRAW (*)    Protein, ur 30 (*)    All other components within normal limits    EKG None  Radiology Ct Abdomen Pelvis Wo Contrast  Result Date: 05/25/2019 CLINICAL DATA:  Left lower quadrant pain. EXAM: CT ABDOMEN AND PELVIS WITHOUT CONTRAST TECHNIQUE: Multidetector CT imaging of the abdomen and pelvis was performed following the standard protocol without IV contrast. Study degraded by patient motion. COMPARISON:  10/16/2014 FINDINGS: Lower chest: Lung base scarring and/or atelectasis. Small amount of chronic fluid at the left anterior lung base, stable from prior CT. No acute findings. Hepatobiliary: No focal liver abnormality is seen. No gallstones, gallbladder wall thickening, or biliary dilatation. Pancreas: Unremarkable. No pancreatic ductal dilatation or surrounding inflammatory changes. Spleen: Normal in size without focal abnormality. Adrenals/Urinary Tract: No adrenal masses. Kidneys normal in overall size and position. Cortical low-density mass, midpole right kidney, 1 cm, consistent with a cyst. Possible additional small mass from the posterior lower pole of the left kidney. No other masses, no stones and no hydronephrosis. Ureters normal course and in caliber. Bladder is unremarkable. Stomach/Bowel: Stomach is  unremarkable. Small bowel and colon are normal in caliber. No wall thickening. No inflammation. Vascular/Lymphatic: Aortic atherosclerosis. No aneurysm. No enlarged lymph nodes. Reproductive: Multiple uterine calcifications consistent with fibroids. Uterus is retroverted. No adnexal masses. Other: Small fat containing umbilical hernia.  No ascites. Musculoskeletal: No fracture. Chronic sclerosis in the L1 vertebra. No osteolytic lesions. IMPRESSION: 1. No acute findings within the abdomen or pelvis. No findings to account for left lower quadrant pain. No CT evidence of diverticulitis. Study is degraded by patient motion. 2. Multiple chronic findings including aortic atherosclerosis and calcified uterine fibroids. Electronically Signed   By: Lajean Manes M.D.   On: 05/25/2019 09:42    Procedures Procedures (including critical care time)  Medications Ordered in ED Medications  morphine 2 MG/ML injection 2 mg (2 mg Intravenous Given 05/25/19 0829)  ondansetron (ZOFRAN) injection 4 mg (4 mg Intravenous Given 05/25/19 0830)     Initial Impression / Assessment and Plan / ED Course  I have reviewed the triage vital signs and the nursing notes.  Pertinent labs & imaging results that were available during my care of the patient were reviewed by me and considered in my medical decision making (see chart for details).       Labs and CT scan are unremarkable.  Patient is instructed to take Tylenol for discomfort and follow-up as needed doubt any significant abdominal pathology  Final Clinical Impressions(s) / ED Diagnoses   Final diagnoses:  Lower abdominal pain    ED Discharge Orders    None       Milton Ferguson, MD 05/25/19 1003

## 2019-05-29 ENCOUNTER — Inpatient Hospital Stay
Admission: RE | Admit: 2019-05-29 | Discharge: 2022-09-26 | Disposition: A | Discharge status: Skilled Nursing Facility | Payer: Medicare HMO | Source: Ambulatory Visit | Attending: Internal Medicine | Admitting: Internal Medicine

## 2019-06-14 ENCOUNTER — Non-Acute Institutional Stay (SKILLED_NURSING_FACILITY): Payer: Medicare HMO | Admitting: Adult Health

## 2019-06-14 ENCOUNTER — Encounter: Payer: Self-pay | Admitting: Adult Health

## 2019-06-14 DIAGNOSIS — N184 Chronic kidney disease, stage 4 (severe): Secondary | ICD-10-CM

## 2019-06-14 DIAGNOSIS — K5904 Chronic idiopathic constipation: Secondary | ICD-10-CM

## 2019-06-14 DIAGNOSIS — D509 Iron deficiency anemia, unspecified: Secondary | ICD-10-CM

## 2019-06-14 NOTE — Progress Notes (Signed)
Location:    [facility] Room Number: 113/D Place of Service:  SNF (31)   CODE STATUS: DNR  No Known Allergies  Chief Complaint  Patient presents with  . Medical Management of Chronic Issues        Chronic idiopathic constipation. Unspecified iron deficiency anemia: . CKD (chronic kidney disease) stage IV:     HPI:  She is a 85 year old long term resident of this facility being seen for the management of her chronic illnesses; constipation; anemia; ckd. There are no reports of changes in her appetite; no agitation or anxiety; there are no reports of uncontrolled pain.   Past Medical History:  Diagnosis Date  . Anemia   . Arthritis   . History of blood transfusion    "today is the first time" (08/05/2014)  . Hypertension     Past Surgical History:  Procedure Laterality Date  . APPENDECTOMY    . CATARACT EXTRACTION Left   . CHOLECYSTECTOMY    . COLONOSCOPY N/A 09/26/2014   Procedure: COLONOSCOPY;  Surgeon: [name], MD;  [facility];  Service: Endoscopy;  Laterality: N/A;  1030am  . CYSTECTOMY     "had cyst taken off lower back"  . ESOPHAGOGASTRODUODENOSCOPY N/A 08/22/2014   JSE:[id number] ring at the gastro junction/small HH  . ORIF FEMUR FRACTURE Left 08/07/2014   Procedure: OPEN REDUCTION INTERNAL FIXATION (ORIF)  LEFT FEMUR FRACTURE;  Surgeon: [name], MD;  Location: [facility];  Service: Orthopedics;  Laterality: Left;  . TONSILLECTOMY      Social History   Socioeconomic History  . Marital status: Widowed    Spouse name: Not on file  . Number of children: Not on file  . Years of education: Not on file  . Highest education level: Not on file  Occupational History  . Not on file  Social Needs  . Financial resource strain: Not hard at all  . Food insecurity    Worry: Never true    Inability: Never true  . Transportation needs    Medical: No    Non-medical: No  Tobacco Use  . Smoking status: Former Smoker   Packs/day: 0.25    Years: 16.00    Pack years: 4.00    Types: Cigarettes  . Smokeless tobacco: Never Used  Substance and Sexual Activity  . Alcohol use: No  . Drug use: No  . Sexual activity: Never  Lifestyle  . Physical activity    Days per week: 0 days    Minutes per session: 0 min  . Stress: Not at all  Relationships  . Social Herbalist on phone: Never    Gets together: Once a week    Attends religious service: Never    Active member of club or organization: No    Attends meetings of clubs or organizations: Never    Relationship status: Widowed  . Intimate partner violence    Fear of current or ex partner: No    Emotionally abused: No    Physically abused: No    Forced sexual activity: No  Other Topics Concern  . Not on file  Social History Narrative  . Not on file   No family history on file.    VITAL SIGNS BP 135/63   Pulse 62   Temp 98.8 F (37.1 C) (Oral)   Resp 14   Ht 4\' 11"  (1.499 m)   Wt 103 lb 6.4 oz (46.9 kg)  SpO2 95%   BMI 20.88 kg/m   Outpatient Encounter Medications as of 06/14/2019  Medication Sig  . acetaminophen (TYLENOL) 325 MG tablet Take 650 mg by mouth every 6 (six) hours as needed.  Marland Kitchen amLODipine (NORVASC) 5 MG tablet Take 5 mg by mouth daily.   . Ensure (ENSURE) Take 237 mLs by mouth daily. Due to weight loss  . ferrous sulfate 325 (65 FE) MG tablet Take 325 mg by mouth daily with breakfast.   . metoprolol (LOPRESSOR) 50 MG tablet Take 1 tablet (50 mg total) by mouth 2 (two) times daily.  . NON FORMULARY Diet Type:  Regular, continue with thin liquids  . ondansetron (ZOFRAN) 4 MG tablet Take 4 mg by mouth 2 (two) times daily as needed.   Vladimir Faster Glycol-Propyl Glycol (SYSTANE) 0.4-0.3 % GEL ophthalmic gel Place 1 application into both eyes 2 (two) times daily at 10 AM and 5 PM.  . sodium bicarbonate 650 MG tablet Take 650 mg by mouth 2 (two) times daily.   No facility-administered encounter medications on file as of  06/14/2019.      SIGNIFICANT DIAGNOSTIC EXAMS   PREVIOUS;   04-28-19: right hip and pelvic x-ray: There is no acute fracture or subluxation. Dense atherosclerotic calcification of the femoral arteries. Degenerative changes are seen in the LOWER lumbar spine. Coarse calcifications in the pelvis are consistent with calcified fibroids. Bowel sutures are noted in the LOWER pelvis  NO NEW EXAMS.    LABS REVIEWED PREVIOUS;   08-15-18: wbc 6.0; hgb 10.6; hct 35.3;mcv 85.;9 plt 349; glucose 101; bun 31; creat 1.78; k+ 4.4; na++ 139; ca 9.3  12-18-18: wbc 5.6; hgb 11.3 hct 37.2; mcv 84.5; plt 317; glucose 101; bun 46; creat 1.83; k+ 4.4; na++ 139; ca 9.2 liver normal albumin 4.3  02-07-19: wbc 4.9; hgb 10.8; hct 35.4; mcv 84.5; plt 302; glucose 102; bun 47; creat 2.30; k+ 4.1; na++ 136; ca 9.2; liver normal albumin 3.9 02-25-19: glucose 86; bun 42; creat 2.20 ;k+ 4.9; na++ 138; ca 8.7  04-28-19: wbc 6.1; hgb 10.6; hct 35.4; mcv 85.9; plt 336; glucose 107; bun 61; creat 1.91; k+ 4.7; na++ 138; ca 9.4   TODAY'   05-17-19: glucose 95; bun 55; creat 2.14; k+ 5.1; na++ 136; ca 9.1; liver normal albumin 4.0 tsh 2.740 05-25-19: wbc 5.7; hgb 10.8; hct 35.6; mcv 85.6 plt 318; glucose 102; bun 52; creat 1.74; k+ 4.6; na++ 139; ca 9.5; liver normal albumin 4.0     Review of Systems  Unable to perform ROS: Dementia (unable to participate )    Physical Exam Constitutional:      General: She is not in acute distress.    Appearance: She is well-developed. She is not diaphoretic.  Eyes:     Comments: History of left cataract removal.  Neck:     Musculoskeletal: Neck supple.     Thyroid: No thyromegaly.  Cardiovascular:     Rate and Rhythm: Normal rate and regular rhythm.     Pulses: Normal pulses.     Heart sounds: Normal heart sounds.  Pulmonary:     Effort: Pulmonary effort is normal. No respiratory distress.     Breath sounds: Normal breath sounds.  Abdominal:     General: Bowel sounds are normal.  There is no distension.     Palpations: Abdomen is soft.     Tenderness: There is no abdominal tenderness.  Musculoskeletal:     Right lower leg: No edema.  Left lower leg: No edema.     Comments:  Is able to move all extremities  Uses wheelchair  History of left femur ORIF          Lymphadenopathy:     Cervical: No cervical adenopathy.  Skin:    General: Skin is warm and dry.  Neurological:     Mental Status: She is alert. Mental status is at baseline.  Psychiatric:        Mood and Affect: Mood normal.       ASSESSMENT/ PLAN:  TODAY:   1. Chronic idiopathic constipation: is presently off medications will monitor   2. Unspecified iron deficiency anemia: is stable hgb 10.8; will continue iron daily   3. CKD (chronic kidney disease) stage IV: is stable bun 52; creat 1.74 will monitor    PREVIOUS  4.  Left femoral shaft fracture: history of left femur ORIF: is stable has tylenol as needed for pain will monitor   5. Benign hypertension with CKD (chronic kidney disease) stage IV: : is table b/p 135/63 will continue lopressor 50 mg twice daily and norvasc 5 mg daily   6. Colon cancer: without change will monitor  7.  Unspecified type dementia without behavioral disturbance: without change weight is 103  pounds; will continue to monitor her status.       MD is aware of resident's narcotic use and is in agreement with current plan of care. We will attempt to wean resident as appropriate.  Ok Edwards NP [facility]  Contact [phone] Monday through Friday 8am- 5pm  After hours call [phone]

## 2019-06-26 ENCOUNTER — Other Ambulatory Visit: Payer: Self-pay | Admitting: Adult Health

## 2019-06-28 DIAGNOSIS — L603 Nail dystrophy: Secondary | ICD-10-CM | POA: Diagnosis not present

## 2019-06-28 DIAGNOSIS — M2142 Flat foot [pes planus] (acquired), left foot: Secondary | ICD-10-CM | POA: Diagnosis not present

## 2019-06-28 DIAGNOSIS — M2141 Flat foot [pes planus] (acquired), right foot: Secondary | ICD-10-CM | POA: Diagnosis not present

## 2019-06-28 DIAGNOSIS — B351 Tinea unguium: Secondary | ICD-10-CM | POA: Diagnosis not present

## 2019-06-28 DIAGNOSIS — I739 Peripheral vascular disease, unspecified: Secondary | ICD-10-CM | POA: Diagnosis not present

## 2019-07-10 ENCOUNTER — Non-Acute Institutional Stay (SKILLED_NURSING_FACILITY): Payer: Medicare HMO | Admitting: Adult Health

## 2019-07-10 ENCOUNTER — Encounter: Payer: Self-pay | Admitting: Adult Health

## 2019-07-10 DIAGNOSIS — I129 Hypertensive chronic kidney disease with stage 1 through stage 4 chronic kidney disease, or unspecified chronic kidney disease: Secondary | ICD-10-CM | POA: Diagnosis not present

## 2019-07-10 DIAGNOSIS — F329 Major depressive disorder, single episode, unspecified: Secondary | ICD-10-CM | POA: Diagnosis not present

## 2019-07-10 DIAGNOSIS — N184 Chronic kidney disease, stage 4 (severe): Secondary | ICD-10-CM | POA: Diagnosis not present

## 2019-07-10 DIAGNOSIS — F0390 Unspecified dementia without behavioral disturbance: Secondary | ICD-10-CM

## 2019-07-10 DIAGNOSIS — F32A Depression, unspecified: Secondary | ICD-10-CM

## 2019-07-10 NOTE — Progress Notes (Signed)
Location:  [facility] Room Number: 113-D Place of Service:  SNF (31)   CODE STATUS: DNR  No Known Allergies  Chief Complaint  Patient presents with  . Acute Visit    care plan meeting     HPI:  We have come together for her care plan meeting. Her ensure has been stopped as she is declining this. Her appetite is variable from 25-200%. Her weight is stable. There are no reports of uncontrolled pain; no reports of agitation or anxiety. She has had one fall. She continues to be followed for her chronic illnesses including: dementia; depression; hypertension.    Past Medical History:  Diagnosis Date  . Anemia   . Arthritis   . History of blood transfusion    "today is the first time" (08/05/2014)  . Hypertension     Past Surgical History:  Procedure Laterality Date  . APPENDECTOMY    . CATARACT EXTRACTION Left   . CHOLECYSTECTOMY    . COLONOSCOPY N/A 09/26/2014   Procedure: COLONOSCOPY;  Surgeon: [name], MD;  [facility];  Service: Endoscopy;  Laterality: N/A;  1030am  . CYSTECTOMY     "had cyst taken off lower back"  . ESOPHAGOGASTRODUODENOSCOPY N/A 08/22/2014   JQZ:[id number] ring at the gastro junction/small HH  . ORIF FEMUR FRACTURE Left 08/07/2014   Procedure: OPEN REDUCTION INTERNAL FIXATION (ORIF)  LEFT FEMUR FRACTURE;  Surgeon: [name], MD;  Location: [facility];  Service: Orthopedics;  Laterality: Left;  . TONSILLECTOMY      Social History   Socioeconomic History  . Marital status: Widowed    Spouse name: Not on file  . Number of children: Not on file  . Years of education: Not on file  . Highest education level: Not on file  Occupational History  . Not on file  Social Needs  . Financial resource strain: Not hard at all  . Food insecurity    Worry: Never true    Inability: Never true  . Transportation needs    Medical: No    Non-medical: No  Tobacco Use  . Smoking status: Former Smoker    Packs/day: 0.25     Years: 16.00    Pack years: 4.00    Types: Cigarettes  . Smokeless tobacco: Never Used  Substance and Sexual Activity  . Alcohol use: No  . Drug use: No  . Sexual activity: Never  Lifestyle  . Physical activity    Days per week: 0 days    Minutes per session: 0 min  . Stress: Not at all  Relationships  . Social Herbalist on phone: Never    Gets together: Once a week    Attends religious service: Never    Active member of club or organization: No    Attends meetings of clubs or organizations: Never    Relationship status: Widowed  . Intimate partner violence    Fear of current or ex partner: No    Emotionally abused: No    Physically abused: No    Forced sexual activity: No  Other Topics Concern  . Not on file  Social History Narrative  . Not on file   Family History  Family history unknown: Yes      VITAL SIGNS BP (!) 110/56   Pulse (!) 56   Temp 98.4 F (36.9 C) (Oral)   Resp 18   Ht 4\' 11"  (1.499 m)   Wt 101 lb (  45.8 kg)   SpO2 95%   BMI 20.40 kg/m   Outpatient Encounter Medications as of 07/10/2019  Medication Sig  . acetaminophen (TYLENOL) 325 MG tablet Take 650 mg by mouth every 6 (six) hours as needed.  Marland Kitchen amLODipine (NORVASC) 5 MG tablet Take 5 mg by mouth daily.   . ferrous sulfate 325 (65 FE) MG tablet Take 325 mg by mouth daily with breakfast.   . metoprolol (LOPRESSOR) 50 MG tablet Take 1 tablet (50 mg total) by mouth 2 (two) times daily.  . NON FORMULARY Diet Type:  Regular, continue with thin liquids  . ondansetron (ZOFRAN) 4 MG tablet Take 4 mg by mouth 2 (two) times daily as needed.   Vladimir Faster Glycol-Propyl Glycol (SYSTANE) 0.4-0.3 % GEL ophthalmic gel Place 1 application into both eyes 2 (two) times daily at 10 AM and 5 PM.  . sodium bicarbonate 650 MG tablet Take 650 mg by mouth 2 (two) times daily.  . [DISCONTINUED] Ensure (ENSURE) Take 237 mLs by mouth daily. Due to weight loss   No facility-administered encounter  medications on file as of 07/10/2019.      SIGNIFICANT DIAGNOSTIC EXAMS   PREVIOUS;   04-28-19: right hip and pelvic x-ray: There is no acute fracture or subluxation. Dense atherosclerotic calcification of the femoral arteries. Degenerative changes are seen in the LOWER lumbar spine. Coarse calcifications in the pelvis are consistent with calcified fibroids. Bowel sutures are noted in the LOWER pelvis  NO NEW EXAMS.    LABS REVIEWED PREVIOUS;   08-15-18: wbc 6.0; hgb 10.6; hct 35.3;mcv 85.;9 plt 349; glucose 101; bun 31; creat 1.78; k+ 4.4; na++ 139; ca 9.3  12-18-18: wbc 5.6; hgb 11.3 hct 37.2; mcv 84.5; plt 317; glucose 101; bun 46; creat 1.83; k+ 4.4; na++ 139; ca 9.2 liver normal albumin 4.3  02-07-19: wbc 4.9; hgb 10.8; hct 35.4; mcv 84.5; plt 302; glucose 102; bun 47; creat 2.30; k+ 4.1; na++ 136; ca 9.2; liver normal albumin 3.9 02-25-19: glucose 86; bun 42; creat 2.20 ;k+ 4.9; na++ 138; ca 8.7  04-28-19: wbc 6.1; hgb 10.6; hct 35.4; mcv 85.9; plt 336; glucose 107; bun 61; creat 1.91; k+ 4.7; na++ 138; ca 9.4  05-17-19: glucose 95; bun 55; creat 2.14; k+ 5.1; na++ 136; ca 9.1; liver normal albumin 4.0 tsh 2.740 05-25-19: wbc 5.7; hgb 10.8; hct 35.6; mcv 85.6 plt 318; glucose 102; bun 52; creat 1.74; k+ 4.6; na++ 139; ca 9.5; liver normal albumin 4.0    NO NEW LABS.    Review of Systems  Unable to perform ROS: Dementia (unable to participate )    Physical Exam Constitutional:      General: She is not in acute distress.    Appearance: She is well-developed. She is not diaphoretic.  Eyes:     Comments: History of left cataract removal.  Neck:     Musculoskeletal: Neck supple.     Thyroid: No thyromegaly.  Cardiovascular:     Rate and Rhythm: Normal rate and regular rhythm.     Pulses: Normal pulses.     Heart sounds: Normal heart sounds.  Pulmonary:     Effort: Pulmonary effort is normal. No respiratory distress.     Breath sounds: Normal breath sounds.  Abdominal:      General: Bowel sounds are normal. There is no distension.     Palpations: Abdomen is soft.     Tenderness: There is no abdominal tenderness.  Musculoskeletal:     Right lower  leg: No edema.     Left lower leg: No edema.     Comments: Is able to move all extremities  Uses wheelchair  History of left femur ORIF   Lymphadenopathy:     Cervical: No cervical adenopathy.  Skin:    General: Skin is warm and dry.  Neurological:     Mental Status: She is alert. Mental status is at baseline.  Psychiatric:        Mood and Affect: Mood normal.       ASSESSMENT/ PLAN:  TODAY;   1. Dementia without behavioral disturbance unspecified dementia type 2. Benign hypertension with CKD (chronic kidney disease) stage IV 3. Chronic depression  Will continue current medications Will continue current plan of care Will continue to monitor her status.      MD is aware of resident's narcotic use and is in agreement with current plan of care. We will attempt to wean resident as appropriate.  Ok Edwards NP [facility]  Contact [phone] Monday through Friday 8am- 5pm  After hours call [phone]

## 2019-07-13 ENCOUNTER — Encounter: Payer: Self-pay | Admitting: Adult Health

## 2019-07-15 DIAGNOSIS — H11043 Peripheral pterygium, stationary, bilateral: Secondary | ICD-10-CM | POA: Diagnosis not present

## 2019-07-15 DIAGNOSIS — H2521 Age-related cataract, morgagnian type, right eye: Secondary | ICD-10-CM | POA: Diagnosis not present

## 2019-07-15 DIAGNOSIS — H04123 Dry eye syndrome of bilateral lacrimal glands: Secondary | ICD-10-CM | POA: Diagnosis not present

## 2019-07-15 DIAGNOSIS — Z961 Presence of intraocular lens: Secondary | ICD-10-CM | POA: Diagnosis not present

## 2019-07-17 ENCOUNTER — Encounter: Payer: Self-pay | Admitting: Internal Medicine

## 2019-07-17 ENCOUNTER — Non-Acute Institutional Stay (SKILLED_NURSING_FACILITY): Payer: Medicare HMO | Admitting: Internal Medicine

## 2019-07-17 DIAGNOSIS — N184 Chronic kidney disease, stage 4 (severe): Secondary | ICD-10-CM | POA: Diagnosis not present

## 2019-07-17 DIAGNOSIS — D509 Iron deficiency anemia, unspecified: Secondary | ICD-10-CM | POA: Diagnosis not present

## 2019-07-17 DIAGNOSIS — F32A Depression, unspecified: Secondary | ICD-10-CM

## 2019-07-17 DIAGNOSIS — F329 Major depressive disorder, single episode, unspecified: Secondary | ICD-10-CM | POA: Diagnosis not present

## 2019-07-17 NOTE — Progress Notes (Signed)
Location:  [facility] Room Number: 113-D Place of Service:  SNF (31)  Alicia Duos, MD  Patient Care Team: Alicia Duos, MD as PCP - General (Internal Medicine) [name], MD as Consulting Physician (Gastroenterology) [facility] [place] [facility]) Gerlene Fee, NP as Nurse Practitioner (Geriatric Medicine)  Extended Emergency Contact Information Primary Emergency Contact: [name] [place] Phone: [phone] Mobile Phone: [phone] Relation: Niece Secondary Emergency Contact: Zena Amos [place] Phone: [phone] Relation: Neighbor    Allergies: Patient has no known allergies.  Chief Complaint  Patient presents with  . Medical Management of Chronic Issues    Routine [facility] visit    HPI: Patient is a 85 y.o. female who is being seen for routine issues of CKD stage IV, iron deficiency anemia, and chronic depression.  Past Medical History:  Diagnosis Date  . Anemia   . Arthritis   . History of blood transfusion    "today is the first time" (08/05/2014)  . Hypertension   . Left femoral shaft fracture ([facility]) 08/08/2014    Past Surgical History:  Procedure Laterality Date  . APPENDECTOMY    . CATARACT EXTRACTION Left   . CHOLECYSTECTOMY    . COLONOSCOPY N/A 09/26/2014   Procedure: COLONOSCOPY;  Surgeon: [name], MD;  [facility];  Service: Endoscopy;  Laterality: N/A;  1030am  . CYSTECTOMY     "had cyst taken off lower back"  . ESOPHAGOGASTRODUODENOSCOPY N/A 08/22/2014   [id number] ring at the gastro junction/small HH  . ORIF FEMUR FRACTURE Left 08/07/2014   Procedure: OPEN REDUCTION INTERNAL FIXATION (ORIF)  LEFT FEMUR FRACTURE;  Surgeon: [name], MD;  Location: [facility];  Service: Orthopedics;  Laterality: Left;  . TONSILLECTOMY      Allergies as of 07/17/2019   No Known Allergies     Medication List     Notice   This visit is during an admission. Changes to the med list made in this visit will be reflected in the After Visit Summary of the admission.     No orders of the defined types were placed in this encounter.   Immunization History  Administered Date(s) Administered  . Influenza-Unspecified 06/09/2016, 08/01/2017, 06/07/2018, 06/10/2019  . Pneumococcal Conjugate-13 08/01/2017  . Pneumococcal Polysaccharide-23 06/13/2016  . Tdap 08/05/2014, 05/18/2017    Social History   Tobacco Use  . Smoking status: Former Smoker    Packs/day: 0.25    Years: 16.00    Pack years: 4.00    Types: Cigarettes  . Smokeless tobacco: Never Used  Substance Use Topics  . Alcohol use: No    Review of Systems    unable to obtain secondary to dementia; nursing-no acute concerns    Vitals:   07/17/19 1028  BP: (!) 110/56  Pulse: 75  Resp: 18  Temp: 98.6 F (37 C)  SpO2: 95%   Body mass index is 20.4 kg/m. Physical Exam  GENERAL APPEARANCE: Alert, no acute  distress  SKIN: No diaphoresis rash HEENT: Unremarkable RESPIRATORY: Breathing is even, unlabored. Lung sounds are clear   CARDIOVASCULAR: Heart RRR no murmurs, rubs or gallops. No peripheral edema  GASTROINTESTINAL: Abdomen is soft, non-tender, not distended w/ normal bowel sounds.  GENITOURINARY: Bladder non tender, not distended  MUSCULOSKELETAL: No abnormal joints or musculature NEUROLOGIC: Cranial nerves 2-12 grossly intact. Moves all extremities PSYCHIATRIC: Dementia, no behavioral issues  Patient Active Problem List  Diagnosis Date Noted  . Hallucination 05/19/2019  . Chronic depression 05/12/2019  . Fall at nursing home 05/02/2019  . Paraspinal mass 02/11/2019  . Chronic idiopathic constipation 11/13/2018  . Benign hypertension with CKD (chronic kidney disease) stage IV ([facility]) 11/13/2018  . Dementia ([facility]) 07/31/2017  . Osteoporosis 07/31/2017  . CKD (chronic kidney disease) stage 4, GFR 15-29 ml/min (HCC)  05/12/2017  . Colon cancer ([place]) 10/23/2014  . Essential hypertension 08/22/2014  . Anemia, iron deficiency 08/08/2014  . Knee fracture 08/05/2014    CMP     Component Value Date/Time   NA 139 05/25/2019 0841   K 4.6 05/25/2019 0841   CL 105 05/25/2019 0841   CO2 26 05/25/2019 0841   GLUCOSE 102 (H) 05/25/2019 0841   BUN 52 (H) 05/25/2019 0841   CREATININE 1.74 (H) 05/25/2019 0841   CALCIUM 9.5 05/25/2019 0841   PROT 7.3 05/25/2019 0841   ALBUMIN 4.0 05/25/2019 0841   AST 17 05/25/2019 0841   ALT 12 05/25/2019 0841   ALKPHOS 82 05/25/2019 0841   BILITOT 0.6 05/25/2019 0841   GFRNONAA 25 (L) 05/25/2019 0841   GFRAA 29 (L) 05/25/2019 0841   Recent Labs    04/28/19 0759 05/17/19 0440 05/25/19 0841  NA 138 136 139  K 4.7 5.1 4.6  CL 102 105 105  CO2 26 22 26   GLUCOSE 107* 95 102*  BUN 61* 55* 52*  CREATININE 1.91* 2.14* 1.74*  CALCIUM 9.4 9.1 9.5   Recent Labs    02/07/19 0836 05/17/19 0440 05/25/19 0841  AST 13* 19 17  ALT 9 11 12   ALKPHOS 80 86 82  BILITOT 0.4 0.3 0.6  PROT 6.8 6.8 7.3  ALBUMIN 3.9 4.0 4.0   Recent Labs    02/07/19 0836 04/28/19 0759 05/25/19 0841  WBC 4.9 6.1 5.7  NEUTROABS 2.1 3.8 3.0  HGB 10.8* 10.6* 10.8*  HCT 35.4* 35.4* 35.6*  MCV 84.5 85.9 85.6  PLT 302 336 318   No results for input(s): CHOL, LDLCALC, TRIG in the last 8760 hours.  Invalid input(s): HCL No results found for: MICROALBUR Lab Results  Component Value Date   TSH 2.740 05/17/2019   No results found for: HGBA1C No results found for: CHOL, HDL, LDLCALC, LDLDIRECT, TRIG, CHOLHDL  Significant Diagnostic Results in last 30 days:  No results found.  Assessment and Plan  CKD (chronic kidney disease) stage 4, GFR 15-29 ml/min (HCC) GFR 29, most recent creatinine 1.74, stable slightly improved from prior; will monitor intervals  Anemia, iron deficiency Most recent hemoglobin 10.8 which is good for age; continue iron 325 mg daily  Chronic depression  Appears controlled, patient at home wheelchair on no medications; continue supportive care    Alicia Duos, MD

## 2019-07-21 ENCOUNTER — Encounter: Payer: Self-pay | Admitting: Internal Medicine

## 2019-07-21 ENCOUNTER — Encounter (HOSPITAL_COMMUNITY)
Admission: RE | Admit: 2019-07-21 | Discharge: 2019-07-21 | Disposition: A | Discharge status: Home / Self Care | Payer: Medicare HMO | Source: Skilled Nursing Facility | Attending: Internal Medicine | Admitting: Internal Medicine

## 2019-07-21 DIAGNOSIS — E876 Hypokalemia: Secondary | ICD-10-CM | POA: Insufficient documentation

## 2019-07-21 DIAGNOSIS — F329 Major depressive disorder, single episode, unspecified: Secondary | ICD-10-CM | POA: Diagnosis not present

## 2019-07-21 DIAGNOSIS — N184 Chronic kidney disease, stage 4 (severe): Secondary | ICD-10-CM | POA: Diagnosis not present

## 2019-07-21 DIAGNOSIS — Z79899 Other long term (current) drug therapy: Secondary | ICD-10-CM | POA: Diagnosis not present

## 2019-07-21 DIAGNOSIS — K5900 Constipation, unspecified: Secondary | ICD-10-CM | POA: Diagnosis not present

## 2019-07-21 DIAGNOSIS — N189 Chronic kidney disease, unspecified: Secondary | ICD-10-CM | POA: Insufficient documentation

## 2019-07-21 DIAGNOSIS — I129 Hypertensive chronic kidney disease with stage 1 through stage 4 chronic kidney disease, or unspecified chronic kidney disease: Secondary | ICD-10-CM | POA: Insufficient documentation

## 2019-07-21 DIAGNOSIS — R410 Disorientation, unspecified: Secondary | ICD-10-CM | POA: Insufficient documentation

## 2019-07-21 DIAGNOSIS — R5383 Other fatigue: Secondary | ICD-10-CM | POA: Insufficient documentation

## 2019-07-21 DIAGNOSIS — D509 Iron deficiency anemia, unspecified: Secondary | ICD-10-CM | POA: Insufficient documentation

## 2019-07-21 LAB — URINALYSIS, ROUTINE W REFLEX MICROSCOPIC
Bilirubin Urine: NEGATIVE
Glucose, UA: NEGATIVE mg/dL
Hgb urine dipstick: NEGATIVE
Ketones, ur: NEGATIVE mg/dL
Leukocytes,Ua: NEGATIVE
Nitrite: NEGATIVE
Protein, ur: 100 mg/dL — AB
Specific Gravity, Urine: 1.014 (ref 1.005–1.030)
pH: 6 (ref 5.0–8.0)

## 2019-07-21 LAB — CBC WITH DIFFERENTIAL/PLATELET
Abs Immature Granulocytes: 0.02 10*3/uL (ref 0.00–0.07)
Basophils Absolute: 0 10*3/uL (ref 0.0–0.1)
Basophils Relative: 1 %
Eosinophils Absolute: 0.1 10*3/uL (ref 0.0–0.5)
Eosinophils Relative: 2 %
HCT: 32.6 % — ABNORMAL LOW (ref 36.0–46.0)
Hemoglobin: 10.2 g/dL — ABNORMAL LOW (ref 12.0–15.0)
Immature Granulocytes: 0 %
Lymphocytes Relative: 27 %
Lymphs Abs: 1.5 10*3/uL (ref 0.7–4.0)
MCH: 26.4 pg (ref 26.0–34.0)
MCHC: 31.3 g/dL (ref 30.0–36.0)
MCV: 84.5 fL (ref 80.0–100.0)
Monocytes Absolute: 0.7 10*3/uL (ref 0.1–1.0)
Monocytes Relative: 13 %
Neutro Abs: 3.1 10*3/uL (ref 1.7–7.7)
Neutrophils Relative %: 57 %
Platelets: 271 10*3/uL (ref 150–400)
RBC: 3.86 MIL/uL — ABNORMAL LOW (ref 3.87–5.11)
RDW: 13 % (ref 11.5–15.5)
WBC: 5.3 10*3/uL (ref 4.0–10.5)
nRBC: 0 % (ref 0.0–0.2)

## 2019-07-21 LAB — COMPREHENSIVE METABOLIC PANEL
ALT: 13 U/L (ref 0–44)
AST: 26 U/L (ref 15–41)
Albumin: 3.5 g/dL (ref 3.5–5.0)
Alkaline Phosphatase: 55 U/L (ref 38–126)
Anion gap: 8 (ref 5–15)
BUN: 50 mg/dL — ABNORMAL HIGH (ref 8–23)
CO2: 26 mmol/L (ref 22–32)
Calcium: 8.8 mg/dL — ABNORMAL LOW (ref 8.9–10.3)
Chloride: 104 mmol/L (ref 98–111)
Creatinine, Ser: 2.08 mg/dL — ABNORMAL HIGH (ref 0.44–1.00)
GFR calc Af Amer: 23 mL/min — ABNORMAL LOW (ref >60)
GFR calc non Af Amer: 20 mL/min — ABNORMAL LOW (ref >60)
Glucose, Bld: 118 mg/dL — ABNORMAL HIGH (ref 70–99)
Potassium: 4.1 mmol/L (ref 3.5–5.1)
Sodium: 138 mmol/L (ref 135–145)
Total Bilirubin: 0.7 mg/dL (ref 0.3–1.2)
Total Protein: 6.1 g/dL — ABNORMAL LOW (ref 6.5–8.1)

## 2019-07-21 LAB — TSH: TSH: 0.943 u[IU]/mL (ref 0.350–4.500)

## 2019-07-21 NOTE — Assessment & Plan Note (Signed)
Appears controlled, patient at home wheelchair on no medications; continue supportive care

## 2019-07-21 NOTE — Assessment & Plan Note (Signed)
GFR 29, most recent creatinine 1.74, stable slightly improved from prior; will monitor intervals

## 2019-07-21 NOTE — Assessment & Plan Note (Signed)
Most recent hemoglobin 10.8 which is good for age; continue iron 325 mg daily

## 2019-07-22 LAB — URINE CULTURE: Culture: NO GROWTH

## 2019-07-31 ENCOUNTER — Encounter: Payer: Self-pay | Admitting: Adult Health

## 2019-07-31 NOTE — Progress Notes (Signed)
Location:  [place] Room Number: 113/D Place of Service:  SNF (31) Provider:  Ok Edwards NP   Patient Care Team: Hennie Duos, MD as PCP - General (Internal Medicine) [name], MD as Consulting Physician (Gastroenterology) [facility] [place] [facility]) Nyoka Cowden, Phylis Bougie, NP as Nurse Practitioner (Geriatric Medicine)  Extended Emergency Contact Information Primary Emergency Contact: [name] [place] of [place] Phone: [phone] Mobile Phone: [phone] Relation: Niece Secondary Emergency Contact: Summers of [place] Phone: [phone] Relation: Neighbor  Code Status: dnr Goals of Care: Advanced Directive information Advanced Directives 07/31/2019  Does Patient Have a Medical Advance Directive? Yes  Type of Advance Directive Out of facility DNR (pink MOST or yellow form)  Does patient want to make changes to medical advance directive? No - Patient declined  Copy of [place] in Chart? -  Would patient like information on creating a medical advance directive? -  Pre-existing out of facility DNR order (yellow form or pink MOST form) -     Chief Complaint  Patient presents with  . Medicare Wellness    Annual Wellness Visit    HPI: Patient is a 85 y.o. female seen in today for an annual wellness exam.    Depression screen [facility] 2/9 07/31/2019 06/12/2018 06/05/2017  Decreased Interest 0 0 0  Down, Depressed, Hopeless 0 0 0  PHQ - 2 Score 0 0 0    Fall Risk  07/31/2019 06/12/2018 06/05/2017  Falls in the past year? 1 No Yes  Number falls in past yr: 0 - 1  Injury with Fall? 0 - Yes  Risk for fall due to : Impaired balance/gait;Impaired mobility - -     Health Maintenance  Topic Date Due  . TETANUS/TDAP  05/19/2027  . INFLUENZA VACCINE  Completed  . DEXA SCAN  Completed  . PNA vac Low Risk Adult  Completed     Functional Status Survey:  Is the patient deaf or have difficulty hearing?: No Does the patient have difficulty seeing, even when wearing glasses/contacts?: No Does the patient have difficulty concentrating, remembering, or making decisions?: Yes Does the patient have difficulty walking or climbing stairs?: Yes Does the patient have difficulty dressing or bathing?: Yes   Past Medical History:  Diagnosis Date  . Anemia   . Arthritis   . History of blood transfusion    "today is the first time" (08/05/2014)  . Hypertension   . Left femoral shaft fracture ([place]) 08/08/2014    Past Surgical History:  Procedure Laterality Date  . APPENDECTOMY    . CATARACT EXTRACTION Left   . CHOLECYSTECTOMY    . COLONOSCOPY N/A 09/26/2014   Procedure: COLONOSCOPY;  Surgeon: [name], MD;  [facility];  Service: Endoscopy;  Laterality: N/A;  1030am  . CYSTECTOMY     "had cyst taken off lower back"  . ESOPHAGOGASTRODUODENOSCOPY N/A 08/22/2014   UKG:[id number] ring at the gastro junction/small HH  . ORIF FEMUR FRACTURE Left 08/07/2014   Procedure: OPEN REDUCTION INTERNAL FIXATION (ORIF)  LEFT FEMUR FRACTURE;  Surgeon: [name], MD;  Location: [place];  Service: Orthopedics;  Laterality: Left;  . TONSILLECTOMY      Family History  Family history unknown: Yes    Social History   Socioeconomic History  . Marital status: Widowed    Spouse name: Not on file  . Number of children: Not on file  .  Years of education: Not on file  . Highest education level: Not on file  Occupational History  . Occupation: retired   Scientific laboratory technician  . Financial resource strain: Not hard at all  . Food insecurity    Worry: Never true    Inability: Never true  . Transportation needs    Medical: No    Non-medical: No  Tobacco Use  . Smoking status: Former Smoker    Packs/day: 0.25    Years: 16.00    Pack years: 4.00    Types: Cigarettes  . Smokeless tobacco: Never Used  Substance and Sexual Activity  . Alcohol use: No   . Drug use: No  . Sexual activity: Never  Lifestyle  . Physical activity    Days per week: 0 days    Minutes per session: 0 min  . Stress: Not at all  Relationships  . Social Herbalist on phone: Never    Gets together: Never    Attends religious service: Never    Active member of club or organization: No    Attends meetings of clubs or organizations: Never    Relationship status: Widowed  Other Topics Concern  . Not on file  Social History Narrative   Long term resident of [facility]     reports that she has quit smoking. Her smoking use included cigarettes. She has a 4.00 pack-year smoking history. She has never used smokeless tobacco. She reports that she does not drink alcohol or use drugs.   No Known Allergies  Outpatient Encounter Medications as of 07/31/2019  Medication Sig  . acetaminophen (TYLENOL) 325 MG tablet Take 650 mg by mouth every 6 (six) hours as needed.  Marland Kitchen amLODipine (NORVASC) 5 MG tablet Take 5 mg by mouth daily.   . ferrous sulfate 325 (65 FE) MG tablet Take 325 mg by mouth daily with breakfast.   . metoprolol (LOPRESSOR) 50 MG tablet Take 1 tablet (50 mg total) by mouth 2 (two) times daily.  . NON FORMULARY Diet Type:  Regular, continue with thin liquids  . ondansetron (ZOFRAN) 4 MG tablet Take 4 mg by mouth 2 (two) times daily as needed.   Vladimir Faster Glycol-Propyl Glycol (SYSTANE) 0.4-0.3 % GEL ophthalmic gel Place 1 application into both eyes 2 (two) times daily at 10 AM and 5 PM.  . sodium bicarbonate 650 MG tablet Take 650 mg by mouth 2 (two) times daily.   No facility-administered encounter medications on file as of 07/31/2019.      Review of Systems:  Review of Systems  Physical Exam: Vitals:   07/31/19 1204  BP: (!) 151/68  Resp: 18  Temp: 97.9 F (36.6 C)  TempSrc: Oral  SpO2: 95%  Weight: 101 lb (45.8 kg)  Height: 4\' 11"  (1.499 m)   Body mass index is 20.4 kg/m. Physical Exam    SIGNIFICANT DIAGNOSTIC EXAMS    PREVIOUS;   04-28-19: right hip and pelvic x-ray: There is no acute fracture or subluxation. Dense atherosclerotic calcification of the femoral arteries. Degenerative changes are seen in the LOWER lumbar spine. Coarse calcifications in the pelvis are consistent with calcified fibroids. Bowel sutures are noted in the LOWER pelvis  NO NEW EXAMS.    LABS REVIEWED PREVIOUS;   08-15-18: wbc 6.0; hgb 10.6; hct 35.3;mcv 85.;9 plt 349; glucose 101; bun 31; creat 1.78; k+ 4.4; na++ 139; ca 9.3  12-18-18: wbc 5.6; hgb 11.3 hct 37.2; mcv 84.5; plt 317; glucose 101; bun 46; creat  1.83; k+ 4.4; na++ 139; ca 9.2 liver normal albumin 4.3  02-07-19: wbc 4.9; hgb 10.8; hct 35.4; mcv 84.5; plt 302; glucose 102; bun 47; creat 2.30; k+ 4.1; na++ 136; ca 9.2; liver normal albumin 3.9 02-25-19: glucose 86; bun 42; creat 2.20 ;k+ 4.9; na++ 138; ca 8.7  04-28-19: wbc 6.1; hgb 10.6; hct 35.4; mcv 85.9; plt 336; glucose 107; bun 61; creat 1.91; k+ 4.7; na++ 138; ca 9.4  05-17-19: glucose 95; bun 55; creat 2.14; k+ 5.1; na++ 136; ca 9.1; liver normal albumin 4.0 tsh 2.740 05-25-19: wbc 5.7; hgb 10.8; hct 35.6; mcv 85.6 plt 318; glucose 102; bun 52; creat 1.74; k+ 4.6; na++ 139; ca 9.5; liver normal albumin 4.0    NO NEW LABS.    Assessment/Plan        This encounter was created in error - please disregard.

## 2019-08-11 ENCOUNTER — Other Ambulatory Visit: Payer: Self-pay | Admitting: Internal Medicine

## 2019-08-11 ENCOUNTER — Other Ambulatory Visit (HOSPITAL_COMMUNITY)
Admission: RE | Admit: 2019-08-11 | Discharge: 2019-08-11 | Disposition: A | Discharge status: Home / Self Care | Payer: Medicare HMO | Source: Ambulatory Visit | Attending: Internal Medicine | Admitting: Internal Medicine

## 2019-08-11 DIAGNOSIS — Z01812 Encounter for preprocedural laboratory examination: Secondary | ICD-10-CM | POA: Diagnosis not present

## 2019-08-11 DIAGNOSIS — Z9189 Other specified personal risk factors, not elsewhere classified: Secondary | ICD-10-CM

## 2019-08-11 DIAGNOSIS — Z20828 Contact with and (suspected) exposure to other viral communicable diseases: Secondary | ICD-10-CM | POA: Insufficient documentation

## 2019-08-13 LAB — SARS CORONAVIRUS 2 (TAT 6-24 HRS): SARS Coronavirus 2: NEGATIVE

## 2019-08-16 ENCOUNTER — Non-Acute Institutional Stay (SKILLED_NURSING_FACILITY): Payer: Medicare HMO | Admitting: Adult Health

## 2019-08-16 ENCOUNTER — Encounter: Payer: Self-pay | Admitting: Adult Health

## 2019-08-16 DIAGNOSIS — I129 Hypertensive chronic kidney disease with stage 1 through stage 4 chronic kidney disease, or unspecified chronic kidney disease: Secondary | ICD-10-CM

## 2019-08-16 DIAGNOSIS — F0390 Unspecified dementia without behavioral disturbance: Secondary | ICD-10-CM

## 2019-08-16 DIAGNOSIS — N184 Chronic kidney disease, stage 4 (severe): Secondary | ICD-10-CM

## 2019-08-16 DIAGNOSIS — C189 Malignant neoplasm of colon, unspecified: Secondary | ICD-10-CM | POA: Diagnosis not present

## 2019-08-16 NOTE — Progress Notes (Signed)
Location:    [place] Room Number: 113/D Place of Service:  SNF (31)   CODE STATUS: DNR  No Known Allergies  Chief Complaint  Patient presents with  . Medical Management of Chronic Issues         Benign hypertension with CKD (chronic kidney disease) stage IV . Colon cancer: without change will monitor   Unspecified type dementia without behavioral disturbance:    HPI:  She is a 85 year old long term resident of this facility being seen for the management of her chronic illnesses: hypertension; colon cancer; dementia. There are no reports of constipation; no reports of uncontrolled ; no reports of agitation or anxiety.    Past Medical History:  Diagnosis Date  . Anemia   . Arthritis   . History of blood transfusion    "today is the first time" (08/05/2014)  . Hypertension   . Left femoral shaft fracture ([place]) 08/08/2014    Past Surgical History:  Procedure Laterality Date  . APPENDECTOMY    . CATARACT EXTRACTION Left   . CHOLECYSTECTOMY    . COLONOSCOPY N/A 09/26/2014   Procedure: COLONOSCOPY;  Surgeon: [name], MD;  [facility];  Service: Endoscopy;  Laterality: N/A;  1030am  . CYSTECTOMY     "had cyst taken off lower back"  . ESOPHAGOGASTRODUODENOSCOPY N/A 08/22/2014   [id number] ring at the gastro junction/small HH  . ORIF FEMUR FRACTURE Left 08/07/2014   Procedure: OPEN REDUCTION INTERNAL FIXATION (ORIF)  LEFT FEMUR FRACTURE;  Surgeon: [name], MD;  Location: [place];  Service: Orthopedics;  Laterality: Left;  . TONSILLECTOMY      Social History   Socioeconomic History  . Marital status: Widowed    Spouse name: Not on file  . Number of children: Not on file  . Years of education: Not on file  . Highest education level: Not on file  Occupational History  . Occupation: retired   Tobacco Use  . Smoking status: Former Smoker    Packs/day: 0.25    Years: 16.00    Pack years: 4.00    Types: Cigarettes  .  Smokeless tobacco: Never Used  Substance and Sexual Activity  . Alcohol use: No  . Drug use: No  . Sexual activity: Never  Other Topics Concern  . Not on file  Social History Narrative   Long term resident of [facility]    Social Determinants of Health   Financial Resource Strain:   . Difficulty of Paying Living Expenses: Not on file  Food Insecurity:   . Worried About Charity fundraiser in the Last Year: Not on file  . Ran Out of Food in the Last Year: Not on file  Transportation Needs:   . Lack of Transportation (Medical): Not on file  . Lack of Transportation (Non-Medical): Not on file  Physical Activity:   . Days of Exercise per Week: Not on file  . Minutes of Exercise per Session: Not on file  Stress:   . Feeling of Stress : Not on file  Social Connections: Unknown  . Frequency of Communication with Friends and Family: Not on file  . Frequency of Social Gatherings with Friends and Family: Never  . Attends Religious Services: Not on file  . Active Member of Clubs or Organizations: Not on file  . Attends Archivist Meetings: Not on file  . Marital Status: Not on file  Intimate Partner Violence:   . Fear  of Current or Ex-Partner: Not on file  . Emotionally Abused: Not on file  . Physically Abused: Not on file  . Sexually Abused: Not on file   Family History  Family history unknown: Yes      VITAL SIGNS BP 122/64   Pulse 62   Temp (!) 97.5 F (36.4 C) (Oral)   Resp 20   Ht 4\' 11"  (1.499 m)   Wt 98 lb 12.8 oz (44.8 kg)   SpO2 95%   BMI 19.96 kg/m   Outpatient Encounter Medications as of 08/16/2019  Medication Sig  . acetaminophen (TYLENOL) 325 MG tablet Take 650 mg by mouth every 6 (six) hours as needed.  Marland Kitchen amLODipine (NORVASC) 5 MG tablet Take 5 mg by mouth daily.   . Cholecalciferol 1.25 MG (50000 UT) capsule Take 50,000 Units by mouth. Once a day on Thursday  . ferrous sulfate 325 (65 FE) MG tablet Take 325 mg by mouth daily with breakfast.   .  metoprolol (LOPRESSOR) 50 MG tablet Take 1 tablet (50 mg total) by mouth 2 (two) times daily.  . NON FORMULARY Diet Change: Dysphagia 1 (puree), continue thin liquids  . ondansetron (ZOFRAN) 4 MG tablet Take 4 mg by mouth 2 (two) times daily as needed.   Vladimir Faster Glycol-Propyl Glycol (SYSTANE) 0.4-0.3 % GEL ophthalmic gel Place 1 application into both eyes 2 (two) times daily at 10 AM and 5 PM.  . sodium bicarbonate 650 MG tablet Take 650 mg by mouth 2 (two) times daily.  . vitamin C (ASCORBIC ACID) 500 MG tablet Take 500 mg by mouth daily.  Marland Kitchen zinc sulfate 220 (50 Zn) MG capsule Take 220 mg by mouth daily.   No facility-administered encounter medications on file as of 08/16/2019.     SIGNIFICANT DIAGNOSTIC EXAMS   PREVIOUS;   04-28-19: right hip and pelvic x-ray: There is no acute fracture or subluxation. Dense atherosclerotic calcification of the femoral arteries. Degenerative changes are seen in the LOWER lumbar spine. Coarse calcifications in the pelvis are consistent with calcified fibroids. Bowel sutures are noted in the LOWER pelvis  NO NEW EXAMS.    LABS REVIEWED PREVIOUS;   08-15-18: wbc 6.0; hgb 10.6; hct 35.3;mcv 85.;9 plt 349; glucose 101; bun 31; creat 1.78; k+ 4.4; na++ 139; ca 9.3  12-18-18: wbc 5.6; hgb 11.3 hct 37.2; mcv 84.5; plt 317; glucose 101; bun 46; creat 1.83; k+ 4.4; na++ 139; ca 9.2 liver normal albumin 4.3  02-07-19: wbc 4.9; hgb 10.8; hct 35.4; mcv 84.5; plt 302; glucose 102; bun 47; creat 2.30; k+ 4.1; na++ 136; ca 9.2; liver normal albumin 3.9 02-25-19: glucose 86; bun 42; creat 2.20 ;k+ 4.9; na++ 138; ca 8.7  04-28-19: wbc 6.1; hgb 10.6; hct 35.4; mcv 85.9; plt 336; glucose 107; bun 61; creat 1.91; k+ 4.7; na++ 138; ca 9.4  05-17-19: glucose 95; bun 55; creat 2.14; k+ 5.1; na++ 136; ca 9.1; liver normal albumin 4.0 tsh 2.740 05-25-19: wbc 5.7; hgb 10.8; hct 35.6; mcv 85.6 plt 318; glucose 102; bun 52; creat 1.74; k+ 4.6; na++ 139; ca 9.5; liver normal albumin  4.0    NO NEW LABS.    Review of Systems  Unable to perform ROS: Dementia (unable to participate )   Physical Exam Constitutional:      General: She is not in acute distress.    Appearance: She is well-developed. She is not diaphoretic.  Eyes:     Comments: History of left cataract removal.  Neck:     Thyroid: No thyromegaly.  Cardiovascular:     Rate and Rhythm: Normal rate and regular rhythm.     Pulses: Normal pulses.     Heart sounds: Normal heart sounds.  Pulmonary:     Effort: Pulmonary effort is normal. No respiratory distress.     Breath sounds: Normal breath sounds.  Abdominal:     General: Bowel sounds are normal. There is no distension.     Palpations: Abdomen is soft.     Tenderness: There is no abdominal tenderness.  Musculoskeletal:     Cervical back: Neck supple.     Right lower leg: No edema.     Left lower leg: No edema.     Comments: Is able to move all extremities  Uses wheelchair  History of left femur ORIF    Lymphadenopathy:     Cervical: No cervical adenopathy.  Skin:    General: Skin is warm and dry.  Neurological:     Mental Status: She is alert. Mental status is at baseline.  Psychiatric:        Mood and Affect: Mood normal.     ASSESSMENT/ PLAN:  TODAY:   1. Benign hypertension with CKD (chronic kidney disease) stage VI is stable b/p 122/64 will continue lopressor 50 mg twice daily norvasc 5 mg daily   2. Colon cancer: without change will monitor   3. Unspecified type dementia without behavioral disturbance: without change weight is 98 pounds will monitor   PREVIOUS  4.  Left femoral shaft fracture: history of left femur ORIF: is stable has tylenol as needed for pain will monitor   5. Benign hypertension with CKD (chronic kidney disease) stage IV: : is table b/p 135/63 will continue lopressor 50 mg twice daily and norvasc 5 mg daily   6. Chronic idiopathic constipation: is presently off medications will monitor   7.  Unspecified iron deficiency anemia: is stable hgb 10.8; will continue iron daily   8. CKD (chronic kidney disease) stage IV: is stable bun 52; creat 1.74 will monitor     MD is aware of resident's narcotic use and is in agreement with current plan of care. We will attempt to wean resident as appropriate.  Ok Edwards NP [facility] Adult Medicine  Contact [phone] Monday through Friday 8am- 5pm  After hours call [phone]

## 2019-08-17 ENCOUNTER — Other Ambulatory Visit: Payer: Self-pay | Admitting: Internal Medicine

## 2019-08-17 ENCOUNTER — Other Ambulatory Visit (HOSPITAL_COMMUNITY)
Admission: RE | Admit: 2019-08-17 | Discharge: 2019-08-17 | Disposition: A | Discharge status: Home / Self Care | Payer: Medicare HMO | Source: Ambulatory Visit | Attending: Internal Medicine | Admitting: Internal Medicine

## 2019-08-17 DIAGNOSIS — Z20828 Contact with and (suspected) exposure to other viral communicable diseases: Secondary | ICD-10-CM | POA: Insufficient documentation

## 2019-08-17 DIAGNOSIS — Z9189 Other specified personal risk factors, not elsewhere classified: Secondary | ICD-10-CM

## 2019-08-18 LAB — SARS CORONAVIRUS 2 (TAT 6-24 HRS): SARS Coronavirus 2: NEGATIVE

## 2019-08-23 LAB — NOVEL CORONAVIRUS, NAA (HOSP ORDER, SEND-OUT TO REF LAB; TAT 18-24 HRS): SARS-CoV-2, NAA: NOT DETECTED

## 2019-08-28 LAB — NOVEL CORONAVIRUS, NAA (HOSP ORDER, SEND-OUT TO REF LAB; TAT 18-24 HRS): SARS-CoV-2, NAA: NOT DETECTED

## 2019-09-01 ENCOUNTER — Other Ambulatory Visit (HOSPITAL_COMMUNITY)
Admission: RE | Admit: 2019-09-01 | Discharge: 2019-09-01 | Disposition: A | Discharge status: Home / Self Care | Payer: Medicare HMO | Source: Ambulatory Visit | Attending: Adult Health | Admitting: Adult Health

## 2019-09-01 DIAGNOSIS — Z20828 Contact with and (suspected) exposure to other viral communicable diseases: Secondary | ICD-10-CM | POA: Diagnosis not present

## 2019-09-02 LAB — NOVEL CORONAVIRUS, NAA (HOSP ORDER, SEND-OUT TO REF LAB; TAT 18-24 HRS): SARS-CoV-2, NAA: NOT DETECTED

## 2019-09-11 DIAGNOSIS — Z1159 Encounter for screening for other viral diseases: Secondary | ICD-10-CM | POA: Diagnosis not present

## 2019-09-11 DIAGNOSIS — I129 Hypertensive chronic kidney disease with stage 1 through stage 4 chronic kidney disease, or unspecified chronic kidney disease: Secondary | ICD-10-CM | POA: Diagnosis not present

## 2019-09-19 ENCOUNTER — Non-Acute Institutional Stay (SKILLED_NURSING_FACILITY): Payer: Medicare HMO | Admitting: Adult Health

## 2019-09-19 ENCOUNTER — Encounter: Payer: Self-pay | Admitting: Adult Health

## 2019-09-19 DIAGNOSIS — D509 Iron deficiency anemia, unspecified: Secondary | ICD-10-CM

## 2019-09-19 DIAGNOSIS — K5904 Chronic idiopathic constipation: Secondary | ICD-10-CM

## 2019-09-19 DIAGNOSIS — I129 Hypertensive chronic kidney disease with stage 1 through stage 4 chronic kidney disease, or unspecified chronic kidney disease: Secondary | ICD-10-CM | POA: Diagnosis not present

## 2019-09-19 DIAGNOSIS — Z1159 Encounter for screening for other viral diseases: Secondary | ICD-10-CM | POA: Diagnosis not present

## 2019-09-19 DIAGNOSIS — F0390 Unspecified dementia without behavioral disturbance: Secondary | ICD-10-CM

## 2019-09-19 NOTE — Progress Notes (Signed)
Location:    [place] Room Number: 113D Place of Service:  SNF (31) Phillips Grout NP    CODE STATUS: DNR  No Known Allergies  Chief Complaint  Patient presents with  . Medical Management of Chronic Issues       Unspecified type dementia without behavioral disturbance:  Chronic idiopathic constipation: Unspecified iron deficiency anemia:    HPI:  She is a 86 year old long term resident of this facility being seen for the management of her chronic illnesses: dementia; constipation; anemia. There are no reports of uncontrolled pain; her weight is stable; no changes in appetite; no reports of insomnia.   Past Medical History:  Diagnosis Date  . Anemia   . Arthritis   . History of blood transfusion    "today is the first time" (08/05/2014)  . Hypertension   . Left femoral shaft fracture ([place]) 08/08/2014    Past Surgical History:  Procedure Laterality Date  . APPENDECTOMY    . CATARACT EXTRACTION Left   . CHOLECYSTECTOMY    . COLONOSCOPY N/A 09/26/2014   Procedure: COLONOSCOPY;  Surgeon: [name], MD;  [facility];  Service: Endoscopy;  Laterality: N/A;  1030am  . CYSTECTOMY     "had cyst taken off lower back"  . ESOPHAGOGASTRODUODENOSCOPY N/A 08/22/2014   [id number] ring at the gastro junction/small HH  . ORIF FEMUR FRACTURE Left 08/07/2014   Procedure: OPEN REDUCTION INTERNAL FIXATION (ORIF)  LEFT FEMUR FRACTURE;  Surgeon: [name], MD;  Location: [place];  Service: Orthopedics;  Laterality: Left;  . TONSILLECTOMY      Social History   Socioeconomic History  . Marital status: Widowed    Spouse name: Not on file  . Number of children: Not on file  . Years of education: Not on file  . Highest education level: Not on file  Occupational History  . Occupation: retired   Tobacco Use  . Smoking status: Former Smoker    Packs/day: 0.25    Years: 16.00    Pack years: 4.00    Types: Cigarettes  . Smokeless tobacco: Never  Used  Substance and Sexual Activity  . Alcohol use: No  . Drug use: No  . Sexual activity: Never  Other Topics Concern  . Not on file  Social History Narrative   Long term resident of [facility]    Social Determinants of Health   Financial Resource Strain:   . Difficulty of Paying Living Expenses: Not on file  Food Insecurity:   . Worried About Charity fundraiser in the Last Year: Not on file  . Ran Out of Food in the Last Year: Not on file  Transportation Needs:   . Lack of Transportation (Medical): Not on file  . Lack of Transportation (Non-Medical): Not on file  Physical Activity:   . Days of Exercise per Week: Not on file  . Minutes of Exercise per Session: Not on file  Stress:   . Feeling of Stress : Not on file  Social Connections: Unknown  . Frequency of Communication with Friends and Family: Not on file  . Frequency of Social Gatherings with Friends and Family: Never  . Attends Religious Services: Not on file  . Active Member of Clubs or Organizations: Not on file  . Attends Archivist Meetings: Not on file  . Marital Status: Not on file  Intimate Partner Violence:   . Fear of Current or Ex-Partner: Not on file  .  Emotionally Abused: Not on file  . Physically Abused: Not on file  . Sexually Abused: Not on file   Family History  Family history unknown: Yes      VITAL SIGNS BP (!) 153/63   Pulse 65   Temp 98.6 F (37 C) (Oral)   Resp 20   Ht 4\' 11"  (1.499 m)   Wt 98 lb (44.5 kg)   SpO2 95%   BMI 19.79 kg/m   Outpatient Encounter Medications as of 09/19/2019  Medication Sig  . acetaminophen (TYLENOL) 325 MG tablet Take 650 mg by mouth every 6 (six) hours as needed.  Marland Kitchen amLODipine (NORVASC) 5 MG tablet Take 5 mg by mouth daily.   . ferrous sulfate 325 (65 FE) MG tablet Take 325 mg by mouth daily with breakfast.   . metoprolol (LOPRESSOR) 50 MG tablet Take 1 tablet (50 mg total) by mouth 2 (two) times daily.  . NON FORMULARY Diet Change: Dysphagia 1  (puree), continue thin liquids  . ondansetron (ZOFRAN) 4 MG tablet Take 4 mg by mouth 2 (two) times daily as needed.   Vladimir Faster Glycol-Propyl Glycol (SYSTANE) 0.4-0.3 % GEL ophthalmic gel Place 1 application into both eyes 2 (two) times daily at 10 AM and 5 PM.  . sodium bicarbonate 650 MG tablet Take 650 mg by mouth 2 (two) times daily.   No facility-administered encounter medications on file as of 09/19/2019.     SIGNIFICANT DIAGNOSTIC EXAMS   PREVIOUS;   04-28-19: right hip and pelvic x-ray: There is no acute fracture or subluxation. Dense atherosclerotic calcification of the femoral arteries. Degenerative changes are seen in the LOWER lumbar spine. Coarse calcifications in the pelvis are consistent with calcified fibroids. Bowel sutures are noted in the LOWER pelvis  NO NEW EXAMS.    LABS REVIEWED PREVIOUS;   12-18-18: wbc 5.6; hgb 11.3 hct 37.2; mcv 84.5; plt 317; glucose 101; bun 46; creat 1.83; k+ 4.4; na++ 139; ca 9.2 liver normal albumin 4.3  02-07-19: wbc 4.9; hgb 10.8; hct 35.4; mcv 84.5; plt 302; glucose 102; bun 47; creat 2.30; k+ 4.1; na++ 136; ca 9.2; liver normal albumin 3.9 02-25-19: glucose 86; bun 42; creat 2.20 ;k+ 4.9; na++ 138; ca 8.7  04-28-19: wbc 6.1; hgb 10.6; hct 35.4; mcv 85.9; plt 336; glucose 107; bun 61; creat 1.91; k+ 4.7; na++ 138; ca 9.4  05-17-19: glucose 95; bun 55; creat 2.14; k+ 5.1; na++ 136; ca 9.1; liver normal albumin 4.0 tsh 2.740 05-25-19: wbc 5.7; hgb 10.8; hct 35.6; mcv 85.6 plt 318; glucose 102; bun 52; creat 1.74; k+ 4.6; na++ 139; ca 9.5; liver normal albumin 4.0    TODAY  07-21-19: wbc 5.3; hgb 10.2; hct 32.6; mcv 84.5 plt 271; glucose 118; bun 50; creat 2.08; k+ 4.1; na++ 138; ca 8.8; liver normal albumin 3.5; tsh 0.943; urine culture: no growth     Review of Systems  Unable to perform ROS: Dementia (unable to participate )    Physical Exam Constitutional:      General: She is not in acute distress.    Appearance: She is  well-developed. She is not diaphoretic.  Eyes:     Comments:  History of left cataract removal.  Neck:     Thyroid: No thyromegaly.  Cardiovascular:     Rate and Rhythm: Normal rate and regular rhythm.     Heart sounds: Normal heart sounds.  Pulmonary:     Effort: Pulmonary effort is normal. No respiratory distress.  Breath sounds: Normal breath sounds.  Abdominal:     General: Bowel sounds are normal. There is no distension.     Palpations: Abdomen is soft.     Tenderness: There is no abdominal tenderness.  Musculoskeletal:     Cervical back: Neck supple.     Right lower leg: No edema.     Left lower leg: No edema.     Comments: Is able to move all extremities  Uses wheelchair  History of left femur ORIF     Lymphadenopathy:     Cervical: No cervical adenopathy.  Skin:    General: Skin is warm and dry.  Neurological:     Mental Status: She is alert. Mental status is at baseline.  Psychiatric:        Mood and Affect: Mood normal.        ASSESSMENT/ PLAN:  TODAY:   1. Unspecified type dementia without behavioral disturbance: without change weight is 98 pounds will monitor   2. Chronic idiopathic constipation: is presently off medications will monitor  2. Unspecified iron deficiency anemia: is stable hgb 10.2 will continue iron daily   PREVIOUS  4.  Left femoral shaft fracture: history of left femur ORIF: is stable has tylenol as needed for pain will monitor   5. CKD (chronic kidney disease) stage IV: is stable bun 50; creat 2.08 will monitor   6. Colon cancer: without change will monitor   7. Benign hypertension with CKD (chronic kidney disease) stage VI is stable b/p 153/63 will continue lopressor 50 mg twice daily norvasc 5 mg daily   MD is aware of resident's narcotic use and is in agreement with current plan of care. We will attempt to wean resident as appropriate.  Ok Edwards NP [facility] Adult Medicine  Contact [phone] Monday through Friday  8am- 5pm  After hours call [phone]

## 2019-09-26 DIAGNOSIS — Z1159 Encounter for screening for other viral diseases: Secondary | ICD-10-CM | POA: Diagnosis not present

## 2019-09-26 DIAGNOSIS — I129 Hypertensive chronic kidney disease with stage 1 through stage 4 chronic kidney disease, or unspecified chronic kidney disease: Secondary | ICD-10-CM | POA: Diagnosis not present

## 2019-10-03 DIAGNOSIS — Z1159 Encounter for screening for other viral diseases: Secondary | ICD-10-CM | POA: Diagnosis not present

## 2019-10-03 DIAGNOSIS — I129 Hypertensive chronic kidney disease with stage 1 through stage 4 chronic kidney disease, or unspecified chronic kidney disease: Secondary | ICD-10-CM | POA: Diagnosis not present

## 2019-10-09 ENCOUNTER — Non-Acute Institutional Stay (SKILLED_NURSING_FACILITY): Payer: Medicare HMO | Admitting: Adult Health

## 2019-10-09 ENCOUNTER — Encounter: Payer: Self-pay | Admitting: Adult Health

## 2019-10-09 DIAGNOSIS — N184 Chronic kidney disease, stage 4 (severe): Secondary | ICD-10-CM

## 2019-10-09 DIAGNOSIS — F0390 Unspecified dementia without behavioral disturbance: Secondary | ICD-10-CM

## 2019-10-09 DIAGNOSIS — C189 Malignant neoplasm of colon, unspecified: Secondary | ICD-10-CM

## 2019-10-09 NOTE — Progress Notes (Signed)
Location:    [place] Room Number: 113D Place of Service:  SNF (31) Phillips Grout NP   CODE STATUS: DNR  No Known Allergies  Chief Complaint  Patient presents with  . Acute Visit    Care Plan Meeting    HPI:  We have come together for her care plan meeting. BIMS 5/15 mood 0/30. She has lost 5 pounds in the past quarter. She has had 2 falls. There are no reports of uncontrolled pain. No reports of agitation. She continues to be followed for her chronic illnesses including: dementia; colon cancer; ckd stage 4.   Past Medical History:  Diagnosis Date  . Anemia   . Arthritis   . History of blood transfusion    "today is the first time" (08/05/2014)  . Hypertension   . Left femoral shaft fracture ([place]) 08/08/2014    Past Surgical History:  Procedure Laterality Date  . APPENDECTOMY    . CATARACT EXTRACTION Left   . CHOLECYSTECTOMY    . COLONOSCOPY N/A 09/26/2014   Procedure: COLONOSCOPY;  Surgeon: [name], MD;  [facility];  Service: Endoscopy;  Laterality: N/A;  1030am  . CYSTECTOMY     "had cyst taken off lower back"  . ESOPHAGOGASTRODUODENOSCOPY N/A 08/22/2014   [id number] ring at the gastro junction/small HH  . ORIF FEMUR FRACTURE Left 08/07/2014   Procedure: OPEN REDUCTION INTERNAL FIXATION (ORIF)  LEFT FEMUR FRACTURE;  Surgeon: [name], MD;  Location: [place];  Service: Orthopedics;  Laterality: Left;  . TONSILLECTOMY      Social History   Socioeconomic History  . Marital status: Widowed    Spouse name: Not on file  . Number of children: Not on file  . Years of education: Not on file  . Highest education level: Not on file  Occupational History  . Occupation: retired   Tobacco Use  . Smoking status: Former Smoker    Packs/day: 0.25    Years: 16.00    Pack years: 4.00    Types: Cigarettes  . Smokeless tobacco: Never Used  Substance and Sexual Activity  . Alcohol use: No  . Drug use: No  . Sexual  activity: Never  Other Topics Concern  . Not on file  Social History Narrative   Long term resident of [facility]    Social Determinants of Health   Financial Resource Strain:   . Difficulty of Paying Living Expenses: Not on file  Food Insecurity:   . Worried About Charity fundraiser in the Last Year: Not on file  . Ran Out of Food in the Last Year: Not on file  Transportation Needs:   . Lack of Transportation (Medical): Not on file  . Lack of Transportation (Non-Medical): Not on file  Physical Activity:   . Days of Exercise per Week: Not on file  . Minutes of Exercise per Session: Not on file  Stress:   . Feeling of Stress : Not on file  Social Connections: Unknown  . Frequency of Communication with Friends and Family: Not on file  . Frequency of Social Gatherings with Friends and Family: Never  . Attends Religious Services: Not on file  . Active Member of Clubs or Organizations: Not on file  . Attends Archivist Meetings: Not on file  . Marital Status: Not on file  Intimate Partner Violence:   . Fear of Current or Ex-Partner: Not on file  . Emotionally Abused: Not on file  .  Physically Abused: Not on file  . Sexually Abused: Not on file   Family History  Family history unknown: Yes      VITAL SIGNS BP (!) 162/64   Pulse 66   Temp 98.8 F (37.1 C) (Oral)   Resp 16   Ht 4\' 11"  (1.499 m)   Wt 100 lb (45.4 kg)   SpO2 95%   BMI 20.20 kg/m   Outpatient Encounter Medications as of 10/09/2019  Medication Sig  . acetaminophen (TYLENOL) 325 MG tablet Take 650 mg by mouth every 6 (six) hours as needed.  Marland Kitchen amLODipine (NORVASC) 5 MG tablet Take 5 mg by mouth daily.   . ferrous sulfate 325 (65 FE) MG tablet Take 325 mg by mouth daily with breakfast.   . metoprolol (LOPRESSOR) 50 MG tablet Take 1 tablet (50 mg total) by mouth 2 (two) times daily.  . NON FORMULARY Diet Change: Dysphagia 1 (puree), continue thin liquids  . ondansetron (ZOFRAN) 4 MG tablet Take 4 mg by  mouth 2 (two) times daily as needed.   Vladimir Faster Glycol-Propyl Glycol (SYSTANE) 0.4-0.3 % GEL ophthalmic gel Place 1 application into both eyes 2 (two) times daily at 10 AM and 5 PM.  . sodium bicarbonate 650 MG tablet Take 650 mg by mouth 2 (two) times daily.   No facility-administered encounter medications on file as of 10/09/2019.     SIGNIFICANT DIAGNOSTIC EXAMS   PREVIOUS;   04-28-19: right hip and pelvic x-ray: There is no acute fracture or subluxation. Dense atherosclerotic calcification of the femoral arteries. Degenerative changes are seen in the LOWER lumbar spine. Coarse calcifications in the pelvis are consistent with calcified fibroids. Bowel sutures are noted in the LOWER pelvis  NO NEW EXAMS.    LABS REVIEWED PREVIOUS;   12-18-18: wbc 5.6; hgb 11.3 hct 37.2; mcv 84.5; plt 317; glucose 101; bun 46; creat 1.83; k+ 4.4; na++ 139; ca 9.2 liver normal albumin 4.3  02-07-19: wbc 4.9; hgb 10.8; hct 35.4; mcv 84.5; plt 302; glucose 102; bun 47; creat 2.30; k+ 4.1; na++ 136; ca 9.2; liver normal albumin 3.9 02-25-19: glucose 86; bun 42; creat 2.20 ;k+ 4.9; na++ 138; ca 8.7  04-28-19: wbc 6.1; hgb 10.6; hct 35.4; mcv 85.9; plt 336; glucose 107; bun 61; creat 1.91; k+ 4.7; na++ 138; ca 9.4  05-17-19: glucose 95; bun 55; creat 2.14; k+ 5.1; na++ 136; ca 9.1; liver normal albumin 4.0 tsh 2.740 05-25-19: wbc 5.7; hgb 10.8; hct 35.6; mcv 85.6 plt 318; glucose 102; bun 52; creat 1.74; k+ 4.6; na++ 139; ca 9.5; liver normal albumin 4.0   07-21-19: wbc 5.3; hgb 10.2; hct 32.6; mcv 84.5 plt 271; glucose 118; bun 50; creat 2.08; k+ 4.1; na++ 138; ca 8.8; liver normal albumin 3.5; tsh 0.943; urine culture: no growth    NO NEW LABS.   Review of Systems  Unable to perform ROS: Dementia (unable to participate )    Physical Exam Constitutional:      General: She is not in acute distress.    Appearance: She is well-developed. She is not diaphoretic.     Comments: thin  Eyes:     Comments:  History of left cataract removal.   Neck:     Thyroid: No thyromegaly.  Cardiovascular:     Rate and Rhythm: Normal rate and regular rhythm.     Heart sounds: Normal heart sounds.  Pulmonary:     Effort: Pulmonary effort is normal. No respiratory distress.  Breath sounds: Normal breath sounds.  Abdominal:     General: Bowel sounds are normal. There is no distension.     Palpations: Abdomen is soft.     Tenderness: There is no abdominal tenderness.  Musculoskeletal:     Cervical back: Neck supple.     Right lower leg: No edema.     Left lower leg: No edema.     Comments: Is able to move all extremities  Uses wheelchair  History of left femur ORIF      Lymphadenopathy:     Cervical: No cervical adenopathy.  Skin:    General: Skin is warm and dry.  Neurological:     Mental Status: She is alert. Mental status is at baseline.  Psychiatric:        Mood and Affect: Mood normal.        ASSESSMENT/ PLAN:  TODAY  1. Dementia without behavioral disturbance unspecified dementia type 2. ckd stage 4 3. Colon cancer unspecified part of colon  Will continue current medications Will continue current plan of care Will continue to monitor her status.     MD is aware of resident's narcotic use and is in agreement with current plan of care. We will attempt to wean resident as appropriate.  Ok Edwards NP [facility]  Contact [phone] Monday through Friday 8am- 5pm  After hours call [phone]

## 2019-10-10 DIAGNOSIS — I129 Hypertensive chronic kidney disease with stage 1 through stage 4 chronic kidney disease, or unspecified chronic kidney disease: Secondary | ICD-10-CM | POA: Diagnosis not present

## 2019-10-10 DIAGNOSIS — Z1159 Encounter for screening for other viral diseases: Secondary | ICD-10-CM | POA: Diagnosis not present

## 2019-10-18 DIAGNOSIS — I129 Hypertensive chronic kidney disease with stage 1 through stage 4 chronic kidney disease, or unspecified chronic kidney disease: Secondary | ICD-10-CM | POA: Diagnosis not present

## 2019-10-18 DIAGNOSIS — Z1159 Encounter for screening for other viral diseases: Secondary | ICD-10-CM | POA: Diagnosis not present

## 2019-10-23 ENCOUNTER — Encounter: Payer: Self-pay | Admitting: Internal Medicine

## 2019-10-23 ENCOUNTER — Non-Acute Institutional Stay (SKILLED_NURSING_FACILITY): Payer: Medicare HMO | Admitting: Internal Medicine

## 2019-10-23 DIAGNOSIS — K5904 Chronic idiopathic constipation: Secondary | ICD-10-CM

## 2019-10-23 DIAGNOSIS — I129 Hypertensive chronic kidney disease with stage 1 through stage 4 chronic kidney disease, or unspecified chronic kidney disease: Secondary | ICD-10-CM | POA: Diagnosis not present

## 2019-10-23 DIAGNOSIS — N184 Chronic kidney disease, stage 4 (severe): Secondary | ICD-10-CM | POA: Diagnosis not present

## 2019-10-23 DIAGNOSIS — F0390 Unspecified dementia without behavioral disturbance: Secondary | ICD-10-CM | POA: Diagnosis not present

## 2019-10-23 NOTE — Progress Notes (Signed)
Location:  [place] Room Number: 113-D Place of Service:  SNF (31)  Hennie Duos, MD  Patient Care Team: Hennie Duos, MD as PCP - General (Internal Medicine) [name], MD as Consulting Physician (Gastroenterology) [facility] [place] ([place]) Gerlene Fee, NP as Nurse Practitioner (Geriatric Medicine)  Extended Emergency Contact Information Primary Emergency Contact: [name] [place] Phone: [phone] Mobile Phone: [phone] Relation: Niece Secondary Emergency Contact: Zena Amos [place] Phone: [phone] Relation: Neighbor    Allergies: Patient has no known allergies.  Chief Complaint  Patient presents with  . Medical Management of Chronic Issues    Routine [place] visit    HPI: Patient is a 86 y.o. female who is being seen for routine issues of hypertension, chronic constipation, and dementia.  Past Medical History:  Diagnosis Date  . Anemia   . Arthritis   . History of blood transfusion    "today is the first time" (08/05/2014)  . Hypertension   . Left femoral shaft fracture ([place]) 08/08/2014    Past Surgical History:  Procedure Laterality Date  . APPENDECTOMY    . CATARACT EXTRACTION Left   . CHOLECYSTECTOMY    . COLONOSCOPY N/A 09/26/2014   Procedure: COLONOSCOPY;  Surgeon: [name], MD;  [facility];  Service: Endoscopy;  Laterality: N/A;  1030am  . CYSTECTOMY     "had cyst taken off lower back"  . ESOPHAGOGASTRODUODENOSCOPY N/A 08/22/2014   [id number] ring at the gastro junction/small HH  . ORIF FEMUR FRACTURE Left 08/07/2014   Procedure: OPEN REDUCTION INTERNAL FIXATION (ORIF)  LEFT FEMUR FRACTURE;  Surgeon: [name], MD;  Location: [place];  Service: Orthopedics;  Laterality: Left;  . TONSILLECTOMY      Allergies as of 10/23/2019   No Known Allergies     Medication List    Notice     This visit is during an admission. Changes to the med list made in this visit will be reflected in the After Visit Summary of the admission.     No orders of the defined types were placed in this encounter.   Immunization History  Administered Date(s) Administered  . Influenza-Unspecified 06/09/2016, 08/01/2017, 06/07/2018, 06/10/2019  . Moderna SARS-COVID-2 Vaccination 09/11/2019, 10/09/2019  . Pneumococcal Conjugate-13 08/01/2017  . Pneumococcal Polysaccharide-23 06/13/2016  . Tdap 08/05/2014, 05/18/2017    Social History   Tobacco Use  . Smoking status: Former Smoker    Packs/day: 0.25    Years: 16.00    Pack years: 4.00    Types: Cigarettes  . Smokeless tobacco: Never Used  Substance Use Topics  . Alcohol use: No    Review of Systems unable to obtain secondary to dementia     Vitals:   10/23/19 0921  BP: (!) 162/64  Pulse: 66  Resp: 16  Temp: 98.2 F (36.8 C)  SpO2: 95%   Body mass index is 20.2 kg/m. Physical Exam  GENERAL APPEARANCE: Alert, conversant, No acute distress  SKIN: No diaphoresis rash HEENT: Unremarkable RESPIRATORY: Breathing is even, unlabored. Lung sounds are clear   CARDIOVASCULAR: Heart RRR no murmurs, rubs or gallops. No peripheral edema  GASTROINTESTINAL: Abdomen is soft, non-tender, not distended w/ normal bowel sounds.  GENITOURINARY: Bladder non tender, not distended  MUSCULOSKELETAL: No abnormal joints or musculature NEUROLOGIC: Cranial nerves 2-12 grossly intact. Moves all extremities PSYCHIATRIC: Dementia, no behavioral issues  Patient Active Problem List  Diagnosis Date Noted  . Hallucination 05/19/2019  . Chronic depression 05/12/2019  . Fall at nursing home 05/02/2019  . Paraspinal mass 02/11/2019  . Chronic idiopathic constipation 11/13/2018  . Benign hypertension with CKD (chronic kidney disease) stage IV ([place]) 11/13/2018  . Dementia ([place]) 07/31/2017  . Osteoporosis 07/31/2017  . Colon cancer ([place]) 10/23/2014   . Essential hypertension 08/22/2014  . Anemia, iron deficiency 08/08/2014  . Knee fracture 08/05/2014    CMP     Component Value Date/Time   NA 138 07/21/2019 1440   K 4.1 07/21/2019 1440   CL 104 07/21/2019 1440   CO2 26 07/21/2019 1440   GLUCOSE 118 (H) 07/21/2019 1440   BUN 50 (H) 07/21/2019 1440   CREATININE 2.08 (H) 07/21/2019 1440   CALCIUM 8.8 (L) 07/21/2019 1440   PROT 6.1 (L) 07/21/2019 1440   ALBUMIN 3.5 07/21/2019 1440   AST 26 07/21/2019 1440   ALT 13 07/21/2019 1440   ALKPHOS 55 07/21/2019 1440   BILITOT 0.7 07/21/2019 1440   GFRNONAA 20 (L) 07/21/2019 1440   GFRAA 23 (L) 07/21/2019 1440   Recent Labs    05/17/19 0440 05/25/19 0841 07/21/19 1440  NA 136 139 138  K 5.1 4.6 4.1  CL 105 105 104  CO2 22 26 26   GLUCOSE 95 102* 118*  BUN 55* 52* 50*  CREATININE 2.14* 1.74* 2.08*  CALCIUM 9.1 9.5 8.8*   Recent Labs    05/17/19 0440 05/25/19 0841 07/21/19 1440  AST 19 17 26   ALT 11 12 13   ALKPHOS 86 82 55  BILITOT 0.3 0.6 0.7  PROT 6.8 7.3 6.1*  ALBUMIN 4.0 4.0 3.5   Recent Labs    04/28/19 0759 05/25/19 0841 07/21/19 1440  WBC 6.1 5.7 5.3  NEUTROABS 3.8 3.0 3.1  HGB 10.6* 10.8* 10.2*  HCT 35.4* 35.6* 32.6*  MCV 85.9 85.6 84.5  PLT 336 318 271   No results for input(s): CHOL, LDLCALC, TRIG in the last 8760 hours.  Invalid input(s): HCL No results found for: MICROALBUR Lab Results  Component Value Date   TSH 0.943 07/21/2019   No results found for: HGBA1C No results found for: CHOL, HDL, LDLCALC, LDLDIRECT, TRIG, CHOLHDL  Significant Diagnostic Results in last 30 days:  No results found.  Assessment and Plan  Benign hypertension with CKD (chronic kidney disease) stage IV (HCC) Controlled; continue Norvasc 5 mg daily and metoprolol 50 mg twice daily  Chronic idiopathic constipation Chronic and stable; continue Linzess 145 mcg daily  Dementia Stable; patient on no dementia specific medications, will continue supportive  care    Hennie Duos, MD

## 2019-10-25 DIAGNOSIS — I129 Hypertensive chronic kidney disease with stage 1 through stage 4 chronic kidney disease, or unspecified chronic kidney disease: Secondary | ICD-10-CM | POA: Diagnosis not present

## 2019-10-25 DIAGNOSIS — Z1159 Encounter for screening for other viral diseases: Secondary | ICD-10-CM | POA: Diagnosis not present

## 2019-10-27 ENCOUNTER — Encounter: Payer: Self-pay | Admitting: Internal Medicine

## 2019-10-27 NOTE — Assessment & Plan Note (Signed)
Chronic and stable; continue Linzess 145 mcg daily

## 2019-10-27 NOTE — Assessment & Plan Note (Signed)
Controlled; continue Norvasc 5 mg daily and metoprolol 50 mg twice daily

## 2019-10-27 NOTE — Assessment & Plan Note (Signed)
Stable; patient on no dementia specific medications, will continue supportive care

## 2019-11-06 DIAGNOSIS — I129 Hypertensive chronic kidney disease with stage 1 through stage 4 chronic kidney disease, or unspecified chronic kidney disease: Secondary | ICD-10-CM | POA: Diagnosis not present

## 2019-11-06 DIAGNOSIS — Z1159 Encounter for screening for other viral diseases: Secondary | ICD-10-CM | POA: Diagnosis not present

## 2019-11-14 DIAGNOSIS — Z1159 Encounter for screening for other viral diseases: Secondary | ICD-10-CM | POA: Diagnosis not present

## 2019-11-14 DIAGNOSIS — I129 Hypertensive chronic kidney disease with stage 1 through stage 4 chronic kidney disease, or unspecified chronic kidney disease: Secondary | ICD-10-CM | POA: Diagnosis not present

## 2019-11-20 ENCOUNTER — Encounter: Payer: Self-pay | Admitting: Adult Health

## 2019-11-20 DIAGNOSIS — I129 Hypertensive chronic kidney disease with stage 1 through stage 4 chronic kidney disease, or unspecified chronic kidney disease: Secondary | ICD-10-CM | POA: Diagnosis not present

## 2019-11-20 DIAGNOSIS — Z1159 Encounter for screening for other viral diseases: Secondary | ICD-10-CM | POA: Diagnosis not present

## 2019-11-20 NOTE — Progress Notes (Signed)
Location:    [place] Room Number: 113/D Place of Service:  SNF (31)   CODE STATUS: DNR  No Known Allergies  Chief Complaint  Patient presents with  . Acute Visit    Right breast mass    HPI:    Past Medical History:  Diagnosis Date  . Anemia   . Arthritis   . History of blood transfusion    "today is the first time" (08/05/2014)  . Hypertension   . Left femoral shaft fracture ([place]) 08/08/2014    Past Surgical History:  Procedure Laterality Date  . APPENDECTOMY    . CATARACT EXTRACTION Left   . CHOLECYSTECTOMY    . COLONOSCOPY N/A 09/26/2014   Procedure: COLONOSCOPY;  Surgeon: [name], MD;  [facility];  Service: Endoscopy;  Laterality: N/A;  1030am  . CYSTECTOMY     "had cyst taken off lower back"  . ESOPHAGOGASTRODUODENOSCOPY N/A 08/22/2014   [id number] ring at the gastro junction/small HH  . ORIF FEMUR FRACTURE Left 08/07/2014   Procedure: OPEN REDUCTION INTERNAL FIXATION (ORIF)  LEFT FEMUR FRACTURE;  Surgeon: [name], MD;  Location: [place];  Service: Orthopedics;  Laterality: Left;  . TONSILLECTOMY      Social History   Socioeconomic History  . Marital status: Widowed    Spouse name: Not on file  . Number of children: Not on file  . Years of education: Not on file  . Highest education level: Not on file  Occupational History  . Occupation: retired   Tobacco Use  . Smoking status: Former Smoker    Packs/day: 0.25    Years: 16.00    Pack years: 4.00    Types: Cigarettes  . Smokeless tobacco: Never Used  Substance and Sexual Activity  . Alcohol use: No  . Drug use: No  . Sexual activity: Never  Other Topics Concern  . Not on file  Social History Narrative   Long term resident of [facility]    Social Determinants of Health   Financial Resource Strain:   . Difficulty of Paying Living Expenses:   Food Insecurity:   . Worried About Charity fundraiser in the Last Year:   . Arboriculturist in the Last  Year:   Transportation Needs:   . Film/video editor (Medical):   Marland Kitchen Lack of Transportation (Non-Medical):   Physical Activity:   . Days of Exercise per Week:   . Minutes of Exercise per Session:   Stress:   . Feeling of Stress :   Social Connections: Unknown  . Frequency of Communication with Friends and Family: Not on file  . Frequency of Social Gatherings with Friends and Family: Never  . Attends Religious Services: Not on file  . Active Member of Clubs or Organizations: Not on file  . Attends Archivist Meetings: Not on file  . Marital Status: Not on file  Intimate Partner Violence:   . Fear of Current or Ex-Partner:   . Emotionally Abused:   Marland Kitchen Physically Abused:   . Sexually Abused:    Family History  Family history unknown: Yes      VITAL SIGNS BP (!) 98/58   Pulse (!) 58   Temp 98.6 F (37 C) (Oral)   Resp 18   Ht 4\' 11"  (1.499 m)   Wt 101 lb 9.6 oz (46.1 kg)   SpO2 95%   BMI 20.52 kg/m   Outpatient Encounter Medications as of  11/20/2019  Medication Sig  . acetaminophen (TYLENOL) 325 MG tablet Take 650 mg by mouth every 6 (six) hours as needed.  Marland Kitchen amLODipine (NORVASC) 5 MG tablet Take 5 mg by mouth daily.   . ferrous sulfate 325 (65 FE) MG tablet Take 325 mg by mouth daily with breakfast.   . metoprolol (LOPRESSOR) 50 MG tablet Take 1 tablet (50 mg total) by mouth 2 (two) times daily.  . NON FORMULARY Diet Change: Dysphagia 1 (puree), continue thin liquids  . ondansetron (ZOFRAN) 4 MG tablet Take 4 mg by mouth 2 (two) times daily as needed.   Vladimir Faster Glycol-Propyl Glycol (SYSTANE) 0.4-0.3 % GEL ophthalmic gel Place 1 application into both eyes 2 (two) times daily at 10 AM and 5 PM.  . sodium bicarbonate 650 MG tablet Take 650 mg by mouth 2 (two) times daily.   No facility-administered encounter medications on file as of 11/20/2019.     SIGNIFICANT DIAGNOSTIC EXAMS       ASSESSMENT/ PLAN:    MD is aware of resident's narcotic use  and is in agreement with current plan of care. We will attempt to wean resident as appropriate.  Ok Edwards NP [facility]  Contact [phone] Monday through Friday 8am- 5pm  After hours call [phone]

## 2019-11-26 ENCOUNTER — Non-Acute Institutional Stay (SKILLED_NURSING_FACILITY): Payer: Medicare HMO | Admitting: Adult Health

## 2019-11-26 ENCOUNTER — Encounter: Payer: Self-pay | Admitting: Adult Health

## 2019-11-26 DIAGNOSIS — K5904 Chronic idiopathic constipation: Secondary | ICD-10-CM

## 2019-11-26 DIAGNOSIS — I129 Hypertensive chronic kidney disease with stage 1 through stage 4 chronic kidney disease, or unspecified chronic kidney disease: Secondary | ICD-10-CM | POA: Diagnosis not present

## 2019-11-26 DIAGNOSIS — F0390 Unspecified dementia without behavioral disturbance: Secondary | ICD-10-CM

## 2019-11-26 DIAGNOSIS — N184 Chronic kidney disease, stage 4 (severe): Secondary | ICD-10-CM

## 2019-11-26 NOTE — Progress Notes (Signed)
Location:    [place] Room Number: 113/D Place of Service:  SNF (31)   CODE STATUS: DNR  No Known Allergies  Chief Complaint  Patient presents with  . Annual Exam    Annual Exam    HPI:  She is a 86 year old long term resident of this facility being seen for her annual exam. She has not been hospitalized over the past year. She has not had a recent fall. Her weight is stable no reports of uncontrolled pain; no reports of anxiety or agitation.   Past Medical History:  Diagnosis Date  . Anemia   . Arthritis   . History of blood transfusion    "today is the first time" (08/05/2014)  . Hypertension   . Left femoral shaft fracture ([place]) 08/08/2014    Past Surgical History:  Procedure Laterality Date  . APPENDECTOMY    . CATARACT EXTRACTION Left   . CHOLECYSTECTOMY    . COLONOSCOPY N/A 09/26/2014   Procedure: COLONOSCOPY;  Surgeon: [name], MD;  [facility];  Service: Endoscopy;  Laterality: N/A;  1030am  . CYSTECTOMY     "had cyst taken off lower back"  . ESOPHAGOGASTRODUODENOSCOPY N/A 08/22/2014   [id number] ring at the gastro junction/small HH  . ORIF FEMUR FRACTURE Left 08/07/2014   Procedure: OPEN REDUCTION INTERNAL FIXATION (ORIF)  LEFT FEMUR FRACTURE;  Surgeon: [name], MD;  Location: [facility] [place];  Service: Orthopedics;  Laterality: Left;  . TONSILLECTOMY      Social History   Socioeconomic History  . Marital status: Widowed    Spouse name: Not on file  . Number of children: Not on file  . Years of education: Not on file  . Highest education level: Not on file  Occupational History  . Occupation: retired   Tobacco Use  . Smoking status: Former Smoker    Packs/day: 0.25    Years: 16.00    Pack years: 4.00    Types: Cigarettes  . Smokeless tobacco: Never Used  Substance and Sexual Activity  . Alcohol use: No  . Drug use: No  . Sexual activity: Never  Other Topics Concern  . Not on file  Social History  Narrative   Long term resident of [facility]    Social Determinants of Health   Financial Resource Strain:   . Difficulty of Paying Living Expenses:   Food Insecurity:   . Worried About Charity fundraiser in the Last Year:   . Arboriculturist in the Last Year:   Transportation Needs:   . Film/video editor (Medical):   Marland Kitchen Lack of Transportation (Non-Medical):   Physical Activity:   . Days of Exercise per Week:   . Minutes of Exercise per Session:   Stress:   . Feeling of Stress :   Social Connections: Unknown  . Frequency of Communication with Friends and Family: Not on file  . Frequency of Social Gatherings with Friends and Family: Never  . Attends Religious Services: Not on file  . Active Member of Clubs or Organizations: Not on file  . Attends Archivist Meetings: Not on file  . Marital Status: Not on file  Intimate Partner Violence:   . Fear of Current or Ex-Partner:   . Emotionally Abused:   Marland Kitchen Physically Abused:   . Sexually Abused:    Family History  Family history unknown: Yes      VITAL SIGNS BP (!) 162/64  Pulse 66   Temp 98.4 F (36.9 C) (Oral)   Resp 16   Ht 4\' 11"  (1.499 m)   Wt 101 lb 9.6 oz (46.1 kg)   SpO2 95%   BMI 20.52 kg/m   Outpatient Encounter Medications as of 11/26/2019  Medication Sig  . acetaminophen (TYLENOL) 325 MG tablet Take 650 mg by mouth every 6 (six) hours as needed.  Marland Kitchen amLODipine (NORVASC) 5 MG tablet Take 5 mg by mouth daily.   . ferrous sulfate 325 (65 FE) MG tablet Take 325 mg by mouth daily with breakfast.   . metoprolol (LOPRESSOR) 50 MG tablet Take 1 tablet (50 mg total) by mouth 2 (two) times daily.  . NON FORMULARY Diet Change: Dysphagia 1 (puree), continue thin liquids  . ondansetron (ZOFRAN) 4 MG tablet Take 4 mg by mouth 2 (two) times daily as needed.   Vladimir Faster Glycol-Propyl Glycol (SYSTANE) 0.4-0.3 % GEL ophthalmic gel Place 1 application into both eyes 2 (two) times daily at 10 AM and 5 PM.  . sodium  bicarbonate 650 MG tablet Take 650 mg by mouth 2 (two) times daily.   No facility-administered encounter medications on file as of 11/26/2019.     SIGNIFICANT DIAGNOSTIC EXAMS  PREVIOUS;   04-28-19: right hip and pelvic x-ray: There is no acute fracture or subluxation. Dense atherosclerotic calcification of the femoral arteries. Degenerative changes are seen in the LOWER lumbar spine. Coarse calcifications in the pelvis are consistent with calcified fibroids. Bowel sutures are noted in the LOWER pelvis  NO NEW EXAMS.    LABS REVIEWED PREVIOUS;   12-18-18: wbc 5.6; hgb 11.3 hct 37.2; mcv 84.5; plt 317; glucose 101; bun 46; creat 1.83; k+ 4.4; na++ 139; ca 9.2 liver normal albumin 4.3  02-07-19: wbc 4.9; hgb 10.8; hct 35.4; mcv 84.5; plt 302; glucose 102; bun 47; creat 2.30; k+ 4.1; na++ 136; ca 9.2; liver normal albumin 3.9 02-25-19: glucose 86; bun 42; creat 2.20 ;k+ 4.9; na++ 138; ca 8.7  04-28-19: wbc 6.1; hgb 10.6; hct 35.4; mcv 85.9; plt 336; glucose 107; bun 61; creat 1.91; k+ 4.7; na++ 138; ca 9.4  05-17-19: glucose 95; bun 55; creat 2.14; k+ 5.1; na++ 136; ca 9.1; liver normal albumin 4.0 tsh 2.740 05-25-19: wbc 5.7; hgb 10.8; hct 35.6; mcv 85.6 plt 318; glucose 102; bun 52; creat 1.74; k+ 4.6; na++ 139; ca 9.5; liver normal albumin 4.0   07-21-19: wbc 5.3; hgb 10.2; hct 32.6; mcv 84.5 plt 271; glucose 118; bun 50; creat 2.08; k+ 4.1; na++ 138; ca 8.8; liver normal albumin 3.5; tsh 0.943; urine culture: no growth    NO NEW LABS.    Review of Systems  Unable to perform ROS: Dementia (unable to participate )   Physical Exam Constitutional:      General: She is not in acute distress.    Appearance: She is well-developed. She is not diaphoretic.  HENT:     Nose: Nose normal.     Mouth/Throat:     Mouth: Mucous membranes are moist.     Pharynx: Oropharynx is clear.  Eyes:     Conjunctiva/sclera: Conjunctivae normal.     Comments: History of left cataract removal.   Neck:      Thyroid: No thyromegaly.  Cardiovascular:     Rate and Rhythm: Normal rate and regular rhythm.     Pulses: Normal pulses.     Heart sounds: Normal heart sounds.  Pulmonary:     Effort: Pulmonary effort is  normal. No respiratory distress.     Breath sounds: Normal breath sounds.  Abdominal:     General: Bowel sounds are normal. There is no distension.     Palpations: Abdomen is soft.     Tenderness: There is no abdominal tenderness.  Musculoskeletal:     Cervical back: Neck supple.     Right lower leg: No edema.     Left lower leg: No edema.     Comments:  Is able to move all extremities  Uses wheelchair  History of left femur ORIF    Lymphadenopathy:     Cervical: No cervical adenopathy.  Skin:    General: Skin is warm and dry.  Neurological:     Mental Status: She is alert. Mental status is at baseline.  Psychiatric:        Mood and Affect: Mood normal.       ASSESSMENT/ PLAN:  TODAY:   1. Unspecified type dementia without behavioral disturbance: without change weight is 101 pounds will monitor   2. Chronic idiopathic constipation: is presently off medications will monitor  3. Unspecified iron deficiency anemia: is stable hgb 10.2 will continue iron daily   4.  Left femoral shaft fracture: history of left femur ORIF: is stable has tylenol as needed for pain will monitor   5. CKD (chronic kidney disease) stage IV: is stable bun 50; creat 2.08 will monitor   6. Colon cancer: without change will monitor   7. Benign hypertension with CKD (chronic kidney disease) stage VI is stable b/p 162/64 will continue lopressor 50 mg twice daily norvasc 5 mg daily          MD is aware of resident's narcotic use and is in agreement with current plan of care. We will attempt to wean resident as appropriate.  Ok Edwards NP [facility]  Contact [phone] Monday through Friday 8am- 5pm  After hours call [phone]

## 2019-11-27 NOTE — Progress Notes (Signed)
This encounter was created in error - please disregard.

## 2019-11-29 ENCOUNTER — Encounter: Payer: Self-pay | Admitting: Adult Health

## 2019-11-29 ENCOUNTER — Non-Acute Institutional Stay (SKILLED_NURSING_FACILITY): Payer: Medicare HMO | Admitting: Adult Health

## 2019-11-29 DIAGNOSIS — F329 Major depressive disorder, single episode, unspecified: Secondary | ICD-10-CM

## 2019-11-29 DIAGNOSIS — F0390 Unspecified dementia without behavioral disturbance: Secondary | ICD-10-CM

## 2019-11-29 DIAGNOSIS — Z0000 Encounter for general adult medical examination without abnormal findings: Secondary | ICD-10-CM

## 2019-11-29 DIAGNOSIS — I129 Hypertensive chronic kidney disease with stage 1 through stage 4 chronic kidney disease, or unspecified chronic kidney disease: Secondary | ICD-10-CM

## 2019-11-29 DIAGNOSIS — D509 Iron deficiency anemia, unspecified: Secondary | ICD-10-CM

## 2019-11-29 DIAGNOSIS — F32A Depression, unspecified: Secondary | ICD-10-CM

## 2019-11-29 NOTE — Progress Notes (Signed)
Subjective:   Alicia Guerra is a 86 y.o. female who presents for Medicare Annual (Subsequent) preventive examination.long term resident of [facility]   Review of Systems:   Review of Systems  Unable to perform ROS: Dementia (unable to participate )     Cardiac Risk Factors include: advanced age (>38men, >17 women);sedentary lifestyle;hypertension     Objective:     Vitals: BP (!) 153/63   Pulse 78   Temp (!) 97.2 F (36.2 C)   Resp 20   Ht 4\' 11"  (1.499 m)   Wt 101 lb 9.6 oz (46.1 kg)   SpO2 94%   BMI 20.52 kg/m   Body mass index is 20.52 kg/m.  Advanced Directives 11/29/2019 11/26/2019 11/20/2019 10/23/2019 10/09/2019 09/19/2019 08/16/2019  Does Patient Have a Medical Advance Directive? Yes Yes Yes Yes Yes Yes Yes  Type of Advance Directive Out of facility DNR (pink MOST or yellow form) Out of facility DNR (pink MOST or yellow form) Out of facility DNR (pink MOST or yellow form) Out of facility DNR (pink MOST or yellow form) Out of facility DNR (pink MOST or yellow form) Out of facility DNR (pink MOST or yellow form) Out of facility DNR (pink MOST or yellow form)  Does patient want to make changes to medical advance directive? No - Patient declined No - Patient declined No - Patient declined No - Patient declined No - Patient declined No - Patient declined No - Patient declined  Copy of [place] in [place]  Would patient like information on creating a medical advance directive? - - - - - - -  Pre-existing out of facility DNR order (yellow form or pink MOST form) Pink MOST/Yellow Form most recent copy in chart - Physician notified to receive inpatient order Yellow form placed in chart (order not valid for inpatient use) Yellow form placed in chart (order not valid for inpatient use) Yellow form placed in chart (order not valid for inpatient use) Yellow form placed in chart (order not valid for inpatient use) Yellow form placed in chart (order not valid for  inpatient use) Yellow form placed in chart (order not valid for inpatient use)    Tobacco Social History   Tobacco Use  Smoking Status Former Smoker  . Packs/day: 0.25  . Years: 16.00  . Pack years: 4.00  . Types: Cigarettes  Smokeless Tobacco Never Used     Counseling given: Not Answered   Clinical Intake:  Pre-visit preparation completed: Yes  Pain : No/denies pain     BMI - recorded: 20.52 Nutritional Status: BMI of 19-24  Normal Diabetes: No     Interpreter Needed?: No     Past Medical History:  Diagnosis Date  . Anemia   . Arthritis   . History of blood transfusion    "today is the first time" (08/05/2014)  . Hypertension   . Left femoral shaft fracture ([place]) 08/08/2014   Past Surgical History:  Procedure Laterality Date  . APPENDECTOMY    . CATARACT EXTRACTION Left   . CHOLECYSTECTOMY    . COLONOSCOPY N/A 09/26/2014   Procedure: COLONOSCOPY;  Surgeon: [name], MD;  [facility];  Service: Endoscopy;  Laterality: N/A;  1030am  . CYSTECTOMY     "had cyst taken off lower back"  . ESOPHAGOGASTRODUODENOSCOPY N/A 08/22/2014   [id number] ring at the gastro junction/small HH  . ORIF FEMUR FRACTURE Left 08/07/2014   Procedure: OPEN  REDUCTION INTERNAL FIXATION (ORIF)  LEFT FEMUR FRACTURE;  Surgeon: [name], MD;  Location: [place];  Service: Orthopedics;  Laterality: Left;  . TONSILLECTOMY     Family History  Family history unknown: Yes   Social History   Socioeconomic History  . Marital status: Widowed    Spouse name: Not on file  . Number of children: Not on file  . Years of education: Not on file  . Highest education level: Not on file  Occupational History  . Occupation: retired   Tobacco Use  . Smoking status: Former Smoker    Packs/day: 0.25    Years: 16.00    Pack years: 4.00    Types: Cigarettes  . Smokeless tobacco: Never Used  Substance and Sexual Activity  . Alcohol use: No  . Drug use: No  . Sexual  activity: Never  Other Topics Concern  . Not on file  Social History Narrative   Long term resident of [facility]    Social Determinants of Health   Financial Resource Strain:   . Difficulty of Paying Living Expenses:   Food Insecurity:   . Worried About Charity fundraiser in the Last Year:   . Arboriculturist in the Last Year:   Transportation Needs:   . Film/video editor (Medical):   Marland Kitchen Lack of Transportation (Non-Medical):   Physical Activity:   . Days of Exercise per Week:   . Minutes of Exercise per Session:   Stress:   . Feeling of Stress :   Social Connections: Unknown  . Frequency of Communication with Friends and Family: Not on file  . Frequency of Social Gatherings with Friends and Family: Never  . Attends Religious Services: Not on file  . Active Member of Clubs or Organizations: Not on file  . Attends Archivist Meetings: Not on file  . Marital Status: Not on file    Outpatient Encounter Medications as of 11/29/2019  Medication Sig  . acetaminophen (TYLENOL) 325 MG tablet Take 650 mg by mouth every 6 (six) hours as needed.  Marland Kitchen amLODipine (NORVASC) 5 MG tablet Take 5 mg by mouth daily.   . ferrous sulfate 325 (65 FE) MG tablet Take 325 mg by mouth daily with breakfast.   . metoprolol (LOPRESSOR) 50 MG tablet Take 1 tablet (50 mg total) by mouth 2 (two) times daily.  . NON FORMULARY Diet Change: Dysphagia 1 (puree), continue thin liquids  . ondansetron (ZOFRAN) 4 MG tablet Take 4 mg by mouth 2 (two) times daily as needed.   Vladimir Faster Glycol-Propyl Glycol (SYSTANE) 0.4-0.3 % GEL ophthalmic gel Place 1 application into both eyes 2 (two) times daily at 10 AM and 5 PM.  . sodium bicarbonate 650 MG tablet Take 650 mg by mouth 2 (two) times daily.   No facility-administered encounter medications on file as of 11/29/2019.    Activities of Daily Living In your present state of health, do you have any difficulty performing the following activities: 11/29/2019  07/31/2019  Hearing? Y N  Vision? N N  Difficulty concentrating or making decisions? Tempie Donning  Walking or climbing stairs? Y Y  Dressing or bathing? Y Y  Doing errands, shopping? Y -  Using the Toilet? Y -  In the past six months, have you accidently leaked urine? Y -  Do you have problems with loss of bowel control? Y -  Managing your Medications? Y -  Managing your Finances? Y -  Housekeeping or  managing your Housekeeping? Y -  Some recent data might be hidden    Patient Care Team: Hennie Duos, MD as PCP - General (Internal Medicine) [name], MD as Consulting Physician (Gastroenterology) [facility] [place] [facility] Gerlene Fee, NP as Nurse Practitioner (Geriatric Medicine)    Assessment:   This is a routine wellness examination for [place].  Exercise Activities and Dietary recommendations Current Exercise Habits: The patient does not participate in regular exercise at present  Goals    . Follow up with Primary Care Provider       Fall Risk Fall Risk  11/29/2019 07/31/2019 06/12/2018 06/05/2017  Falls in the past year? 0 1 No Yes  Number falls in past yr: - 0 - 1  Injury with Fall? - 0 - Yes  Risk for fall due to : - Impaired balance/gait;Impaired mobility - -   Is the patient's home free of loose throw rugs in walkways, pet beds, electrical cords, etc?   yes      Grab bars in the bathroom? yes      Handrails on the stairs?   n/a      Adequate lighting?   yes  Timed Get Up and Go performed: unable to participate   Depression Screen PHQ 2/9 Scores 11/29/2019 07/31/2019 06/12/2018 06/05/2017  PHQ - 2 Score 0 0 0 0     Cognitive Function     6CIT Screen 06/12/2018 06/05/2017  What Year? 4 points 4 points  What month? 3 points 3 points  What time? 3 points 3 points  Count back from 20 4 points 4 points  Months in reverse 4 points 4 points  Repeat phrase 10 points 10 points  Total Score 28 28    Immunization History  Administered  Date(s) Administered  . Influenza-Unspecified 06/09/2016, 08/01/2017, 06/07/2018, 06/10/2019  . Moderna SARS-COVID-2 Vaccination 09/11/2019, 10/09/2019  . Pneumococcal Conjugate-13 08/01/2017  . Pneumococcal Polysaccharide-23 06/13/2016  . Tdap 08/05/2014, 05/18/2017    Qualifies for Shingles Vaccine?n/a  Screening Tests Health Maintenance  Topic Date Due  . TETANUS/TDAP  05/19/2027  . INFLUENZA VACCINE  Completed  . DEXA SCAN  Completed  . PNA vac Low Risk Adult  Completed    Cancer Screenings: Lung: Low Dose CT Chest recommended if Age 52-80 years, 30 pack-year currently smoking OR have quit w/in 15years. Patient does not  qualify. Breast:  Up to date on Mammogram? n/a   Up to date of Bone Density/Dexa? n/a Colorectal: n/a  Additional Screenings: n/a: Hepatitis C Screening:      Plan:     I have personally reviewed and noted the following in the patient's chart:   . Medical and social history . Use of alcohol, tobacco or illicit drugs  . Current medications and supplements . Functional ability and status . Nutritional status . Physical activity . Advanced directives . List of other physicians . Hospitalizations, surgeries, and ER visits in previous 12 months . Vitals . Screenings to include cognitive, depression, and falls . Referrals and appointments  In addition, I have reviewed and discussed with patient certain preventive protocols, quality metrics, and best practice recommendations. A written personalized care plan for preventive services as well as general preventive health recommendations were provided to patient.     Gerlene Fee, NP  11/29/2019

## 2019-11-29 NOTE — Patient Instructions (Signed)
  Ms. Irigoyen , Thank you for taking time to come for your Medicare Wellness Visit. I appreciate your ongoing commitment to your health goals. Please review the following plan we discussed and let me know if I can assist you in the future.   These are the goals we discussed: Goals    . Follow up with Primary Care Provider       This is a list of the screening recommended for you and due dates:  Health Maintenance  Topic Date Due  . Tetanus Vaccine  05/19/2027  . Flu Shot  Completed  . DEXA scan (bone density measurement)  Completed  . Pneumonia vaccines  Completed

## 2019-12-05 ENCOUNTER — Other Ambulatory Visit (HOSPITAL_COMMUNITY)
Admission: RE | Admit: 2019-12-05 | Discharge: 2019-12-05 | Disposition: A | Discharge status: Home / Self Care | Payer: Medicare HMO | Source: Skilled Nursing Facility | Attending: Adult Health | Admitting: Adult Health

## 2019-12-05 DIAGNOSIS — I129 Hypertensive chronic kidney disease with stage 1 through stage 4 chronic kidney disease, or unspecified chronic kidney disease: Secondary | ICD-10-CM | POA: Insufficient documentation

## 2019-12-05 LAB — VITAMIN D 25 HYDROXY (VIT D DEFICIENCY, FRACTURES): Vit D, 25-Hydroxy: 22.5 ng/mL — ABNORMAL LOW (ref 30–100)

## 2019-12-10 DIAGNOSIS — Z1159 Encounter for screening for other viral diseases: Secondary | ICD-10-CM | POA: Diagnosis not present

## 2019-12-10 DIAGNOSIS — I129 Hypertensive chronic kidney disease with stage 1 through stage 4 chronic kidney disease, or unspecified chronic kidney disease: Secondary | ICD-10-CM | POA: Diagnosis not present

## 2019-12-27 ENCOUNTER — Encounter: Payer: Self-pay | Admitting: Adult Health

## 2019-12-27 ENCOUNTER — Non-Acute Institutional Stay (SKILLED_NURSING_FACILITY): Payer: Medicare HMO | Admitting: Adult Health

## 2019-12-27 DIAGNOSIS — K5904 Chronic idiopathic constipation: Secondary | ICD-10-CM | POA: Diagnosis not present

## 2019-12-27 DIAGNOSIS — F0390 Unspecified dementia without behavioral disturbance: Secondary | ICD-10-CM | POA: Diagnosis not present

## 2019-12-27 DIAGNOSIS — D509 Iron deficiency anemia, unspecified: Secondary | ICD-10-CM

## 2019-12-27 NOTE — Progress Notes (Signed)
Location:    [place] Room Number: 113/D Place of Service:  SNF (31)   CODE STATUS: DNR  No Known Allergies  Chief Complaint  Patient presents with  . Medical Management of Chronic Issues           Unspecified type dementia without behavioral disturbance:    Chronic idiopathic constipation:   Unspecified iron deficiency anemia:     HPI:  She is a 86 year old long term resident of this facility being seen for the management of her chronic illnesses: dementia; constipation; anemia. No reports of uncontrolled pain. No reports of agitation or anxiety. She is slowly losing weight.   Past Medical History:  Diagnosis Date  . Anemia   . Arthritis   . History of blood transfusion    "today is the first time" (08/05/2014)  . Hypertension   . Left femoral shaft fracture ([place]) 08/08/2014    Past Surgical History:  Procedure Laterality Date  . APPENDECTOMY    . CATARACT EXTRACTION Left   . CHOLECYSTECTOMY    . COLONOSCOPY N/A 09/26/2014   Procedure: COLONOSCOPY;  Surgeon: [name], MD;  [facility];  Service: Endoscopy;  Laterality: N/A;  1030am  . CYSTECTOMY     "had cyst taken off lower back"  . ESOPHAGOGASTRODUODENOSCOPY N/A 08/22/2014   FTD:[id number] ring at the gastro junction/small HH  . ORIF FEMUR FRACTURE Left 08/07/2014   Procedure: OPEN REDUCTION INTERNAL FIXATION (ORIF)  LEFT FEMUR FRACTURE;  Surgeon: [name], MD;  Location: [place];  Service: Orthopedics;  Laterality: Left;  . TONSILLECTOMY      Social History   Socioeconomic History  . Marital status: Widowed    Spouse name: Not on file  . Number of children: Not on file  . Years of education: Not on file  . Highest education level: Not on file  Occupational History  . Occupation: retired   Tobacco Use  . Smoking status: Former Smoker    Packs/day: 0.25    Years: 16.00    Pack years: 4.00    Types: Cigarettes  . Smokeless tobacco: Never Used  Substance and  Sexual Activity  . Alcohol use: No  . Drug use: No  . Sexual activity: Never  Other Topics Concern  . Not on file  Social History Narrative   Long term resident of [facility]    Social Determinants of Health   Financial Resource Strain:   . Difficulty of Paying Living Expenses:   Food Insecurity:   . Worried About Charity fundraiser in the Last Year:   . Arboriculturist in the Last Year:   Transportation Needs:   . Film/video editor (Medical):   Marland Kitchen Lack of Transportation (Non-Medical):   Physical Activity:   . Days of Exercise per Week:   . Minutes of Exercise per Session:   Stress:   . Feeling of Stress :   Social Connections: Unknown  . Frequency of Communication with Friends and Family: Not on file  . Frequency of Social Gatherings with Friends and Family: Never  . Attends Religious Services: Not on file  . Active Member of Clubs or Organizations: Not on file  . Attends Archivist Meetings: Not on file  . Marital Status: Not on file  Intimate Partner Violence:   . Fear of Current or Ex-Partner:   . Emotionally Abused:   Marland Kitchen Physically Abused:   . Sexually Abused:  Family History  Family history unknown: Yes      VITAL SIGNS BP 121/76   Pulse 61   Temp 98.8 F (37.1 C) (Oral)   Resp 17   Ht 4\' 11"  (1.499 m)   Wt 103 lb (46.7 kg)   SpO2 95%   BMI 20.80 kg/m   Outpatient Encounter Medications as of 12/27/2019  Medication Sig  . acetaminophen (TYLENOL) 325 MG tablet Take 650 mg by mouth every 6 (six) hours as needed.  Marland Kitchen amLODipine (NORVASC) 5 MG tablet Take 5 mg by mouth daily.   . Cholecalciferol 1.25 MG (50000 UT) capsule Take 50,000 Units by mouth once a week. On Friday  . ferrous sulfate 325 (65 FE) MG tablet Take 325 mg by mouth daily with breakfast.   . metoprolol (LOPRESSOR) 50 MG tablet Take 1 tablet (50 mg total) by mouth 2 (two) times daily.  . NON FORMULARY Diet Change: Dysphagia 1 (puree), continue thin liquids  . ondansetron (ZOFRAN) 4  MG tablet Take 4 mg by mouth 2 (two) times daily as needed.   Vladimir Faster Glycol-Propyl Glycol (SYSTANE) 0.4-0.3 % GEL ophthalmic gel Place 1 application into both eyes 2 (two) times daily at 10 AM and 5 PM.  . sodium bicarbonate 650 MG tablet Take 650 mg by mouth 2 (two) times daily.   No facility-administered encounter medications on file as of 12/27/2019.     SIGNIFICANT DIAGNOSTIC EXAMS   PREVIOUS;   04-28-19: right hip and pelvic x-ray: There is no acute fracture or subluxation. Dense atherosclerotic calcification of the femoral arteries. Degenerative changes are seen in the LOWER lumbar spine. Coarse calcifications in the pelvis are consistent with calcified fibroids. Bowel sutures are noted in the LOWER pelvis  NO NEW EXAMS.    LABS REVIEWED PREVIOUS;   12-18-18: wbc 5.6; hgb 11.3 hct 37.2; mcv 84.5; plt 317; glucose 101; bun 46; creat 1.83; k+ 4.4; na++ 139; ca 9.2 liver normal albumin 4.3  02-07-19: wbc 4.9; hgb 10.8; hct 35.4; mcv 84.5; plt 302; glucose 102; bun 47; creat 2.30; k+ 4.1; na++ 136; ca 9.2; liver normal albumin 3.9 02-25-19: glucose 86; bun 42; creat 2.20 ;k+ 4.9; na++ 138; ca 8.7  04-28-19: wbc 6.1; hgb 10.6; hct 35.4; mcv 85.9; plt 336; glucose 107; bun 61; creat 1.91; k+ 4.7; na++ 138; ca 9.4  05-17-19: glucose 95; bun 55; creat 2.14; k+ 5.1; na++ 136; ca 9.1; liver normal albumin 4.0 tsh 2.740 05-25-19: wbc 5.7; hgb 10.8; hct 35.6; mcv 85.6 plt 318; glucose 102; bun 52; creat 1.74; k+ 4.6; na++ 139; ca 9.5; liver normal albumin 4.0   07-21-19: wbc 5.3; hgb 10.2; hct 32.6; mcv 84.5 plt 271; glucose 118; bun 50; creat 2.08; k+ 4.1; na++ 138; ca 8.8; liver normal albumin 3.5; tsh 0.943; urine culture: no growth    TODAY  12-05-19: vit D 22.50   Review of Systems  Unable to perform ROS: Dementia (unable to participate )   Physical Exam Constitutional:      General: She is not in acute distress.    Appearance: She is well-developed. She is not diaphoretic.  Eyes:      Comments:  History of left cataract removal.    Neck:     Thyroid: No thyromegaly.  Cardiovascular:     Rate and Rhythm: Normal rate and regular rhythm.     Heart sounds: Normal heart sounds.  Pulmonary:     Effort: Pulmonary effort is normal. No respiratory distress.  Breath sounds: Normal breath sounds.  Abdominal:     General: Bowel sounds are normal. There is no distension.     Palpations: Abdomen is soft.     Tenderness: There is no abdominal tenderness.  Musculoskeletal:     Right lower leg: No edema.     Left lower leg: No edema.     Comments:  Is able to move all extremities  Uses wheelchair  History of left femur ORIF    Lymphadenopathy:     Cervical: No cervical adenopathy.  Skin:    General: Skin is warm and dry.  Neurological:     Mental Status: She is alert. Mental status is at baseline.  Psychiatric:        Mood and Affect: Mood normal.     ASSESSMENT/ PLAN:  TODAY:   1. Unspecified type dementia without behavioral disturbance: without change weight is 103 pounds will monitor   2. Chronic idiopathic constipation: is presently off medications will monitor  3. Unspecified iron deficiency anemia: is stable hgb 10.2 will continue iron daily   PREVIOUS   4.  Left femoral shaft fracture: history of left femur ORIF: is stable has tylenol as needed for pain will monitor   5. CKD (chronic kidney disease) stage IV: is stable bun 50; creat 2.08 will monitor   6. Colon cancer: without change will monitor   7. Benign hypertension with CKD (chronic kidney disease) stage VI is stable b/p 121/76 will continue lopressor 50 mg twice daily norvasc 5 mg daily   8. Vitamin D deficiency: is stable vit D 22.50 will continue vitamin D 50,000 units weekly        MD is aware of resident's narcotic use and is in agreement with current plan of care. We will attempt to wean resident as appropriate.  Ok Edwards NP [facility]  Contact [phone]  Monday through Friday 8am- 5pm  After hours call [phone]

## 2020-01-01 DIAGNOSIS — I129 Hypertensive chronic kidney disease with stage 1 through stage 4 chronic kidney disease, or unspecified chronic kidney disease: Secondary | ICD-10-CM | POA: Diagnosis not present

## 2020-01-01 DIAGNOSIS — Z1159 Encounter for screening for other viral diseases: Secondary | ICD-10-CM | POA: Diagnosis not present

## 2020-01-28 ENCOUNTER — Non-Acute Institutional Stay (SKILLED_NURSING_FACILITY): Payer: Medicare HMO | Admitting: Adult Health

## 2020-01-28 ENCOUNTER — Encounter: Payer: Self-pay | Admitting: Adult Health

## 2020-01-28 DIAGNOSIS — E559 Vitamin D deficiency, unspecified: Secondary | ICD-10-CM

## 2020-01-28 DIAGNOSIS — I129 Hypertensive chronic kidney disease with stage 1 through stage 4 chronic kidney disease, or unspecified chronic kidney disease: Secondary | ICD-10-CM | POA: Diagnosis not present

## 2020-01-28 DIAGNOSIS — N184 Chronic kidney disease, stage 4 (severe): Secondary | ICD-10-CM

## 2020-01-28 NOTE — Progress Notes (Signed)
Location:    [place] Room Number: 113/D Place of Service:  SNF (31)   CODE STATUS: DNR  No Known Allergies  Chief Complaint  Patient presents with  . Medical Management of Chronic Issues        CKD (chronic kidney disease ) stage IV:  Benign hypertension with CKD (Chronic kidney disease) stage IV:  Vitamin D deficiency:     HPI:  She is a 86 year old long term resident of this facility being seen for the management of her chronic illnesses: ckd; hypertension; vit d deficiency. There are no reports of agitation anxiety; or uncontrolled pain; no reports of constipation.   Past Medical History:  Diagnosis Date  . Anemia   . Arthritis   . History of blood transfusion    "today is the first time" (08/05/2014)  . Hypertension   . Left femoral shaft fracture ([place]) 08/08/2014    Past Surgical History:  Procedure Laterality Date  . APPENDECTOMY    . CATARACT EXTRACTION Left   . CHOLECYSTECTOMY    . COLONOSCOPY N/A 09/26/2014   Procedure: COLONOSCOPY;  Surgeon: [name], MD;  [facility];  Service: Endoscopy;  Laterality: N/A;  1030am  . CYSTECTOMY     "had cyst taken off lower back"  . ESOPHAGOGASTRODUODENOSCOPY N/A 08/22/2014   HYI:[id number] ring at the gastro junction/small HH  . ORIF FEMUR FRACTURE Left 08/07/2014   Procedure: OPEN REDUCTION INTERNAL FIXATION (ORIF)  LEFT FEMUR FRACTURE;  Surgeon: [name], MD;  Location: [place];  Service: Orthopedics;  Laterality: Left;  . TONSILLECTOMY      Social History   Socioeconomic History  . Marital status: Widowed    Spouse name: Not on file  . Number of children: Not on file  . Years of education: Not on file  . Highest education level: Not on file  Occupational History  . Occupation: retired   Tobacco Use  . Smoking status: Former Smoker    Packs/day: 0.25    Years: 16.00    Pack years: 4.00    Types: Cigarettes  . Smokeless tobacco: Never Used  Substance and Sexual  Activity  . Alcohol use: No  . Drug use: No  . Sexual activity: Never  Other Topics Concern  . Not on file  Social History Narrative   Long term resident of [facility]    Social Determinants of Health   Financial Resource Strain:   . Difficulty of Paying Living Expenses:   Food Insecurity:   . Worried About Charity fundraiser in the Last Year:   . Arboriculturist in the Last Year:   Transportation Needs:   . Film/video editor (Medical):   Marland Kitchen Lack of Transportation (Non-Medical):   Physical Activity:   . Days of Exercise per Week:   . Minutes of Exercise per Session:   Stress:   . Feeling of Stress :   Social Connections: Unknown  . Frequency of Communication with Friends and Family: Not on file  . Frequency of Social Gatherings with Friends and Family: Never  . Attends Religious Services: Not on file  . Active Member of Clubs or Organizations: Not on file  . Attends Archivist Meetings: Not on file  . Marital Status: Not on file  Intimate Partner Violence:   . Fear of Current or Ex-Partner:   . Emotionally Abused:   Marland Kitchen Physically Abused:   . Sexually Abused:  Family History  Family history unknown: Yes      VITAL SIGNS BP (!) 109/51   Pulse 62   Temp 98.2 F (36.8 C) (Oral)   Resp 16   Ht 4\' 11"  (1.499 m)   Wt 102 lb 3.2 oz (46.4 kg)   SpO2 95%   BMI 20.64 kg/m   Outpatient Encounter Medications as of 01/28/2020  Medication Sig  . acetaminophen (TYLENOL) 325 MG tablet Take 650 mg by mouth every 6 (six) hours as needed.  Marland Kitchen amLODipine (NORVASC) 5 MG tablet Take 5 mg by mouth daily.   . Cholecalciferol 1.25 MG (50000 UT) capsule Take 50,000 Units by mouth once a week. On Friday  . ferrous sulfate 325 (65 FE) MG tablet Take 325 mg by mouth daily with breakfast.   . metoprolol (LOPRESSOR) 50 MG tablet Take 1 tablet (50 mg total) by mouth 2 (two) times daily.  . NON FORMULARY Diet Change: Dysphagia 1 (puree), continue thin liquids  . ondansetron  (ZOFRAN) 4 MG tablet Take 4 mg by mouth 2 (two) times daily as needed.   Vladimir Faster Glycol-Propyl Glycol (SYSTANE) 0.4-0.3 % GEL ophthalmic gel Place 1 application into both eyes 2 (two) times daily at 10 AM and 5 PM.  . sodium bicarbonate 650 MG tablet Take 650 mg by mouth 2 (two) times daily.   No facility-administered encounter medications on file as of 01/28/2020.     SIGNIFICANT DIAGNOSTIC EXAMS  PREVIOUS;   04-28-19: right hip and pelvic x-ray: There is no acute fracture or subluxation. Dense atherosclerotic calcification of the femoral arteries. Degenerative changes are seen in the LOWER lumbar spine. Coarse calcifications in the pelvis are consistent with calcified fibroids. Bowel sutures are noted in the LOWER pelvis  NO NEW EXAMS.    LABS REVIEWED PREVIOUS;   02-07-19: wbc 4.9; hgb 10.8; hct 35.4; mcv 84.5; plt 302; glucose 102; bun 47; creat 2.30; k+ 4.1; na++ 136; ca 9.2; liver normal albumin 3.9 02-25-19: glucose 86; bun 42; creat 2.20 ;k+ 4.9; na++ 138; ca 8.7  04-28-19: wbc 6.1; hgb 10.6; hct 35.4; mcv 85.9; plt 336; glucose 107; bun 61; creat 1.91; k+ 4.7; na++ 138; ca 9.4  05-17-19: glucose 95; bun 55; creat 2.14; k+ 5.1; na++ 136; ca 9.1; liver normal albumin 4.0 tsh 2.740 05-25-19: wbc 5.7; hgb 10.8; hct 35.6; mcv 85.6 plt 318; glucose 102; bun 52; creat 1.74; k+ 4.6; na++ 139; ca 9.5; liver normal albumin 4.0   07-21-19: wbc 5.3; hgb 10.2; hct 32.6; mcv 84.5 plt 271; glucose 118; bun 50; creat 2.08; k+ 4.1; na++ 138; ca 8.8; liver normal albumin 3.5; tsh 0.943; urine culture: no growth   12-05-19: vit D 22.50  NO NEW LABS.    Review of Systems  Unable to perform ROS: Dementia (unable to participate )    Physical Exam Constitutional:      General: She is not in acute distress.    Appearance: She is well-developed. She is not diaphoretic.  Eyes:     Comments: History of left cataract removal.    Neck:     Thyroid: No thyromegaly.  Cardiovascular:     Rate and  Rhythm: Regular rhythm.     Pulses: Normal pulses.     Heart sounds: Normal heart sounds.  Pulmonary:     Effort: Pulmonary effort is normal. No respiratory distress.     Breath sounds: Normal breath sounds.  Abdominal:     General: Bowel sounds are normal. There is  no distension.     Palpations: Abdomen is soft.     Tenderness: There is no abdominal tenderness.  Musculoskeletal:     Cervical back: Neck supple.     Right lower leg: No edema.     Left lower leg: No edema.     Comments:  Is able to move all extremities  Uses wheelchair  History of left femur ORIF     Lymphadenopathy:     Cervical: No cervical adenopathy.  Skin:    General: Skin is warm and dry.  Neurological:     Mental Status: She is alert. Mental status is at baseline.  Psychiatric:        Mood and Affect: Mood normal.      ASSESSMENT/ PLAN:  TODAY:   1. CKD (chronic kidney disease ) stage IV: is table bun 50 creat 2.08 will monitor   2. Benign hypertension with CKD (Chronic kidney disease) stage IV: is stable b/p 105/51 will continue lopressor 50 mg twice daily norvasc 5 mg daily   3. Vitamin D deficiency: is stable vit D 22.50 will continue vit D 50,000 units weekly    PREVIOUS   4.  Left femoral shaft fracture: history of left femur ORIF: is stable has tylenol as needed for pain will monitor   5. Colon cancer: without change will monitor   6. Unspecified type dementia without behavioral disturbance: without change weight is 102 pounds will monitor   7. Chronic idiopathic constipation: is presently off medications will monitor  8. Unspecified iron deficiency anemia: is stable hgb 10.2 will continue iron daily   Will check cbc; cmp tsh    MD is aware of resident's narcotic use and is in agreement with current plan of care. We will attempt to wean resident as appropriate.  Ok Edwards NP [facility]  Contact [phone] Monday through Friday 8am- 5pm  After hours call  [phone]

## 2020-01-30 ENCOUNTER — Other Ambulatory Visit (HOSPITAL_COMMUNITY)
Admission: RE | Admit: 2020-01-30 | Discharge: 2020-01-30 | Disposition: A | Discharge status: Home / Self Care | Payer: Medicare HMO | Source: Skilled Nursing Facility | Attending: Adult Health | Admitting: Adult Health

## 2020-01-30 DIAGNOSIS — I129 Hypertensive chronic kidney disease with stage 1 through stage 4 chronic kidney disease, or unspecified chronic kidney disease: Secondary | ICD-10-CM | POA: Diagnosis not present

## 2020-01-30 LAB — COMPREHENSIVE METABOLIC PANEL
ALT: 12 U/L (ref 0–44)
AST: 17 U/L (ref 15–41)
Albumin: 4 g/dL (ref 3.5–5.0)
Alkaline Phosphatase: 54 U/L (ref 38–126)
Anion gap: 13 (ref 5–15)
BUN: 52 mg/dL — ABNORMAL HIGH (ref 8–23)
CO2: 24 mmol/L (ref 22–32)
Calcium: 9.3 mg/dL (ref 8.9–10.3)
Chloride: 99 mmol/L (ref 98–111)
Creatinine, Ser: 2.04 mg/dL — ABNORMAL HIGH (ref 0.44–1.00)
GFR calc Af Amer: 24 mL/min — ABNORMAL LOW (ref >60)
GFR calc non Af Amer: 20 mL/min — ABNORMAL LOW (ref >60)
Glucose, Bld: 95 mg/dL (ref 70–99)
Potassium: 3.9 mmol/L (ref 3.5–5.1)
Sodium: 136 mmol/L (ref 135–145)
Total Bilirubin: 0.5 mg/dL (ref 0.3–1.2)
Total Protein: 6.8 g/dL (ref 6.5–8.1)

## 2020-01-30 LAB — CBC
HCT: 35.1 % — ABNORMAL LOW (ref 36.0–46.0)
Hemoglobin: 10.8 g/dL — ABNORMAL LOW (ref 12.0–15.0)
MCH: 26.5 pg (ref 26.0–34.0)
MCHC: 30.8 g/dL (ref 30.0–36.0)
MCV: 86 fL (ref 80.0–100.0)
Platelets: 284 10*3/uL (ref 150–400)
RBC: 4.08 MIL/uL (ref 3.87–5.11)
RDW: 13 % (ref 11.5–15.5)
WBC: 6.3 10*3/uL (ref 4.0–10.5)
nRBC: 0 % (ref 0.0–0.2)

## 2020-01-30 LAB — TSH: TSH: 2.055 u[IU]/mL (ref 0.350–4.500)

## 2020-02-01 DIAGNOSIS — E559 Vitamin D deficiency, unspecified: Secondary | ICD-10-CM | POA: Insufficient documentation

## 2020-02-17 ENCOUNTER — Encounter: Payer: Self-pay | Admitting: Adult Health

## 2020-02-17 ENCOUNTER — Non-Acute Institutional Stay (SKILLED_NURSING_FACILITY): Payer: Medicare HMO | Admitting: Adult Health

## 2020-02-17 DIAGNOSIS — L723 Sebaceous cyst: Secondary | ICD-10-CM | POA: Diagnosis not present

## 2020-02-17 NOTE — Progress Notes (Signed)
Location:    [place] Room Number: 113/D Place of Service:  SNF (31)   CODE STATUS: DNR  No Known Allergies  Chief Complaint  Patient presents with   Acute Visit    Cyst    HPI:  She has a large sebaceous on her sacrum with a moderate amount of foul drainage present. She denies nay pain present there are no reports of fevers present I was able to expressive a large amount of drainage.    Past Medical History:  Diagnosis Date   Anemia    Arthritis    History of blood transfusion    "today is the first time" (08/05/2014)   Hypertension    Left femoral shaft fracture ([place]) 08/08/2014    Past Surgical History:  Procedure Laterality Date   APPENDECTOMY     CATARACT EXTRACTION Left    CHOLECYSTECTOMY     COLONOSCOPY N/A 09/26/2014   Procedure: COLONOSCOPY;  Surgeon: [name], MD;  [facility];  Service: Endoscopy;  Laterality: N/A;  1030am   CYSTECTOMY     "had cyst taken off lower back"   ESOPHAGOGASTRODUODENOSCOPY N/A 08/22/2014   XIP:[id number] ring at the gastro junction/small HH   ORIF FEMUR FRACTURE Left 08/07/2014   Procedure: OPEN REDUCTION INTERNAL FIXATION (ORIF)  LEFT FEMUR FRACTURE;  Surgeon: [name], MD;  Location: [place];  Service: Orthopedics;  Laterality: Left;   TONSILLECTOMY      Social History   Socioeconomic History   Marital status: Widowed    Spouse name: Not on file   Number of children: Not on file   Years of education: Not on file   Highest education level: Not on file  Occupational History   Occupation: retired   Tobacco Use   Smoking status: Former Smoker    Packs/day: 0.25    Years: 16.00    Pack years: 4.00    Types: Cigarettes   Smokeless tobacco: Never Used  Scientific laboratory technician Use: Never used  Substance and Sexual Activity   Alcohol use: No   Drug use: No   Sexual activity: Never  Other Topics Concern   Not on file  Social History Narrative   Long  term resident of [facility]    Social Determinants of Health   Financial Resource Strain:    Difficulty of Paying Living Expenses:   Food Insecurity:    Worried About Charity fundraiser in the Last Year:    Arboriculturist in the Last Year:   Transportation Needs:    Film/video editor (Medical):    Lack of Transportation (Non-Medical):   Physical Activity:    Days of Exercise per Week:    Minutes of Exercise per Session:   Stress:    Feeling of Stress :   Social Connections: Unknown   Frequency of Communication with Friends and Family: Not on file   Frequency of Social Gatherings with Friends and Family: Never   Attends Religious Services: Not on Electrical engineer or Organizations: Not on file   Attends Archivist Meetings: Not on file   Marital Status: Not on file  Intimate Partner Violence:    Fear of Current or Ex-Partner:    Emotionally Abused:    Physically Abused:    Sexually Abused:    Family History  Family history unknown: Yes      VITAL SIGNS BP 113/79    Pulse  89    Temp 98.4 F (36.9 C) (Oral)    Resp 17    Ht 4\' 11"  (1.499 m)    Wt 102 lb 9.6 oz (46.5 kg)    SpO2 95%    BMI 20.72 kg/m   Outpatient Encounter Medications as of 02/17/2020  Medication Sig   acetaminophen (TYLENOL) 325 MG tablet Take 650 mg by mouth every 6 (six) hours as needed.   amLODipine (NORVASC) 5 MG tablet Take 5 mg by mouth daily.    Cholecalciferol 1.25 MG (50000 UT) capsule Take 50,000 Units by mouth once a week. On Friday   doxycycline (DORYX) 100 MG EC tablet Take 100 mg by mouth 2 (two) times daily.   ferrous sulfate 325 (65 FE) MG tablet Take 325 mg by mouth daily with breakfast.    metoprolol (LOPRESSOR) 50 MG tablet Take 1 tablet (50 mg total) by mouth 2 (two) times daily.   NON FORMULARY Diet Change: Dysphagia 1 (puree), continue thin liquids   ondansetron (ZOFRAN) 4 MG tablet Take 4 mg by mouth 2 (two) times daily as needed.      Polyethyl Glycol-Propyl Glycol (SYSTANE) 0.4-0.3 % GEL ophthalmic gel Place 1 application into both eyes 2 (two) times daily at 10 AM and 5 PM.   sodium bicarbonate 650 MG tablet Take 650 mg by mouth 2 (two) times daily.   No facility-administered encounter medications on file as of 02/17/2020.     SIGNIFICANT DIAGNOSTIC EXAMS  PREVIOUS;   04-28-19: right hip and pelvic x-ray: There is no acute fracture or subluxation. Dense atherosclerotic calcification of the femoral arteries. Degenerative changes are seen in the LOWER lumbar spine. Coarse calcifications in the pelvis are consistent with calcified fibroids. Bowel sutures are noted in the LOWER pelvis  NO NEW EXAMS.    LABS REVIEWED PREVIOUS;   02-07-19: wbc 4.9; hgb 10.8; hct 35.4; mcv 84.5; plt 302; glucose 102; bun 47; creat 2.30; k+ 4.1; na++ 136; ca 9.2; liver normal albumin 3.9 02-25-19: glucose 86; bun 42; creat 2.20 ;k+ 4.9; na++ 138; ca 8.7  04-28-19: wbc 6.1; hgb 10.6; hct 35.4; mcv 85.9; plt 336; glucose 107; bun 61; creat 1.91; k+ 4.7; na++ 138; ca 9.4  05-17-19: glucose 95; bun 55; creat 2.14; k+ 5.1; na++ 136; ca 9.1; liver normal albumin 4.0 tsh 2.740 05-25-19: wbc 5.7; hgb 10.8; hct 35.6; mcv 85.6 plt 318; glucose 102; bun 52; creat 1.74; k+ 4.6; na++ 139; ca 9.5; liver normal albumin 4.0   07-21-19: wbc 5.3; hgb 10.2; hct 32.6; mcv 84.5 plt 271; glucose 118; bun 50; creat 2.08; k+ 4.1; na++ 138; ca 8.8; liver normal albumin 3.5; tsh 0.943; urine culture: no growth   12-05-19: vit D 22.50  TODAY  01-30-20: wbc 6.3; hgb 10.8; hct 35.1; mcv 86.0 plt 284; glucose 95; bun 52; creat 2.04 ;k+ 3.9; na++ 136; ca 9.3 liver normal albumin 4.0 tsh 2.055  Review of Systems  Unable to perform ROS: Dementia (unable to fully participate )    Physical Exam Constitutional:      General: She is not in acute distress.    Appearance: She is well-developed. She is not diaphoretic.  Eyes:     Comments: History of left cataract removal.      Neck:     Thyroid: No thyromegaly.  Cardiovascular:     Rate and Rhythm: Normal rate and regular rhythm.     Pulses: Normal pulses.     Heart sounds: Normal heart sounds.  Pulmonary:     Effort: Pulmonary effort is normal. No respiratory distress.     Breath sounds: Normal breath sounds.  Abdominal:     General: Bowel sounds are normal. There is no distension.     Palpations: Abdomen is soft.     Tenderness: There is no abdominal tenderness.  Musculoskeletal:     Cervical back: Neck supple.     Right lower leg: No edema.     Left lower leg: No edema.     Comments: Is able to move all extremities  Uses wheelchair  History of left femur ORIF      Lymphadenopathy:     Cervical: No cervical adenopathy.  Skin:    General: Skin is warm and dry.     Comments: Large sebaceous cyst on sacrum: large amount purulent drainage present.    Neurological:     Mental Status: She is alert. Mental status is at baseline.  Psychiatric:        Mood and Affect: Mood normal.       ASSESSMENT/ PLAN:  TODAY  1. Sebaceous cyst: will begin doxycycline 100 mg twice daily for 7 days and will continue dressing changes daily and as needed will monitor her status.    MD is aware of resident's narcotic use and is in agreement with current plan of care. We will attempt to wean resident as appropriate.  Ok Edwards NP [facility]  Contact [phone] Monday through Friday 8am- 5pm  After hours call [phone]

## 2020-02-24 DIAGNOSIS — M2041 Other hammer toe(s) (acquired), right foot: Secondary | ICD-10-CM | POA: Diagnosis not present

## 2020-02-24 DIAGNOSIS — M2042 Other hammer toe(s) (acquired), left foot: Secondary | ICD-10-CM | POA: Diagnosis not present

## 2020-02-24 DIAGNOSIS — B351 Tinea unguium: Secondary | ICD-10-CM | POA: Diagnosis not present

## 2020-02-24 DIAGNOSIS — L603 Nail dystrophy: Secondary | ICD-10-CM | POA: Diagnosis not present

## 2020-02-24 DIAGNOSIS — I739 Peripheral vascular disease, unspecified: Secondary | ICD-10-CM | POA: Diagnosis not present

## 2020-02-25 ENCOUNTER — Non-Acute Institutional Stay (SKILLED_NURSING_FACILITY): Payer: Medicare HMO | Admitting: Adult Health

## 2020-02-25 ENCOUNTER — Encounter: Payer: Self-pay | Admitting: Adult Health

## 2020-02-25 DIAGNOSIS — F0390 Unspecified dementia without behavioral disturbance: Secondary | ICD-10-CM

## 2020-02-25 DIAGNOSIS — C189 Malignant neoplasm of colon, unspecified: Secondary | ICD-10-CM

## 2020-02-25 DIAGNOSIS — K5904 Chronic idiopathic constipation: Secondary | ICD-10-CM | POA: Diagnosis not present

## 2020-02-25 NOTE — Progress Notes (Signed)
Location:    [place] Room Number: 113/D Place of Service:  SNF (31)   CODE STATUS: DNR  No Known Allergies  Chief Complaint  Patient presents with   Medical Management of Chronic Issues        Colon cancer:  Unspecified type dementia without behavioral disturbance:  Chronic idiopathic constipation:    HPI:  She is a 86 year old long term resident of this facility being seen for the management of her chronic illnesses: colon cancer; dementia; constipation. No reports of uncontrolled pain; no reports of agitation; no reports of constipation.   Past Medical History:  Diagnosis Date   Anemia    Arthritis    History of blood transfusion    "today is the first time" (08/05/2014)   Hypertension    Left femoral shaft fracture ([place]) 08/08/2014    Past Surgical History:  Procedure Laterality Date   APPENDECTOMY     CATARACT EXTRACTION Left    CHOLECYSTECTOMY     COLONOSCOPY N/A 09/26/2014   Procedure: COLONOSCOPY;  Surgeon: [name], MD;  [facility];  Service: Endoscopy;  Laterality: N/A;  1030am   CYSTECTOMY     "had cyst taken off lower back"   ESOPHAGOGASTRODUODENOSCOPY N/A 08/22/2014   FWY:[id number] ring at the gastro junction/small HH   ORIF FEMUR FRACTURE Left 08/07/2014   Procedure: OPEN REDUCTION INTERNAL FIXATION (ORIF)  LEFT FEMUR FRACTURE;  Surgeon: [name], MD;  Location: [facility];  Service: Orthopedics;  Laterality: Left;   TONSILLECTOMY      Social History   Socioeconomic History   Marital status: Widowed    Spouse name: Not on file   Number of children: Not on file   Years of education: Not on file   Highest education level: Not on file  Occupational History   Occupation: retired   Tobacco Use   Smoking status: Former Smoker    Packs/day: 0.25    Years: 16.00    Pack years: 4.00    Types: Cigarettes   Smokeless tobacco: Never Used  Scientific laboratory technician Use: Never used  Substance  and Sexual Activity   Alcohol use: No   Drug use: No   Sexual activity: Never  Other Topics Concern   Not on file  Social History Narrative   Long term resident of [facility]    Social Determinants of Health   Financial Resource Strain:    Difficulty of Paying Living Expenses:   Food Insecurity:    Worried About Charity fundraiser in the Last Year:    Arboriculturist in the Last Year:   Transportation Needs:    Film/video editor (Medical):    Lack of Transportation (Non-Medical):   Physical Activity:    Days of Exercise per Week:    Minutes of Exercise per Session:   Stress:    Feeling of Stress :   Social Connections: Unknown   Frequency of Communication with Friends and Family: Not on file   Frequency of Social Gatherings with Friends and Family: Never   Attends Religious Services: Not on Electrical engineer or Organizations: Not on file   Attends Archivist Meetings: Not on file   Marital Status: Not on file  Intimate Partner Violence:    Fear of Current or Ex-Partner:    Emotionally Abused:    Physically Abused:    Sexually Abused:    Family History  Family history unknown: Yes      VITAL SIGNS BP 120/65    Pulse 63    Temp 98.6 F (37 C) (Oral)    Resp 20    Ht 4\' 11"  (1.499 m)    Wt 102 lb 9.6 oz (46.5 kg)    BMI 20.72 kg/m   Outpatient Encounter Medications as of 02/25/2020  Medication Sig   acetaminophen (TYLENOL) 325 MG tablet Take 650 mg by mouth every 6 (six) hours as needed.   amLODipine (NORVASC) 5 MG tablet Take 5 mg by mouth daily.    Cholecalciferol 1.25 MG (50000 UT) capsule Take 50,000 Units by mouth once a week. On Friday   ferrous sulfate 325 (65 FE) MG tablet Take 325 mg by mouth daily with breakfast.    metoprolol (LOPRESSOR) 50 MG tablet Take 1 tablet (50 mg total) by mouth 2 (two) times daily.   NON FORMULARY Diet Change: Dysphagia 1 (puree), continue thin liquids   ondansetron (ZOFRAN) 4 MG  tablet Take 4 mg by mouth 2 (two) times daily as needed.    Polyethyl Glycol-Propyl Glycol (SYSTANE) 0.4-0.3 % GEL ophthalmic gel Place 1 application into both eyes 2 (two) times daily at 10 AM and 5 PM.   sodium bicarbonate 650 MG tablet Take 650 mg by mouth 2 (two) times daily.   No facility-administered encounter medications on file as of 02/25/2020.     SIGNIFICANT DIAGNOSTIC EXAMS   PREVIOUS;   04-28-19: right hip and pelvic x-ray: There is no acute fracture or subluxation. Dense atherosclerotic calcification of the femoral arteries. Degenerative changes are seen in the LOWER lumbar spine. Coarse calcifications in the pelvis are consistent with calcified fibroids. Bowel sutures are noted in the LOWER pelvis  NO NEW EXAMS.    LABS REVIEWED PREVIOUS;   02-07-19: wbc 4.9; hgb 10.8; hct 35.4; mcv 84.5; plt 302; glucose 102; bun 47; creat 2.30; k+ 4.1; na++ 136; ca 9.2; liver normal albumin 3.9 02-25-19: glucose 86; bun 42; creat 2.20 ;k+ 4.9; na++ 138; ca 8.7  04-28-19: wbc 6.1; hgb 10.6; hct 35.4; mcv 85.9; plt 336; glucose 107; bun 61; creat 1.91; k+ 4.7; na++ 138; ca 9.4  05-17-19: glucose 95; bun 55; creat 2.14; k+ 5.1; na++ 136; ca 9.1; liver normal albumin 4.0 tsh 2.740 05-25-19: wbc 5.7; hgb 10.8; hct 35.6; mcv 85.6 plt 318; glucose 102; bun 52; creat 1.74; k+ 4.6; na++ 139; ca 9.5; liver normal albumin 4.0   07-21-19: wbc 5.3; hgb 10.2; hct 32.6; mcv 84.5 plt 271; glucose 118; bun 50; creat 2.08; k+ 4.1; na++ 138; ca 8.8; liver normal albumin 3.5; tsh 0.943; urine culture: no growth   12-05-19: vit D 22.50  TODAY  01-30-20: wbc 6.3; hgb 10.8; hct 35.1; mcv 86.0 plt 284; glucose 95; bun 52; creat 2.04; k+ 3.9; na++ 136; ca 9.3 liver normal albumin 4.0 tsh 2.055    Review of Systems  Unable to perform ROS: Dementia (unable to participate )    Physical Exam Constitutional:      General: She is not in acute distress.    Appearance: She is well-developed. She is not diaphoretic.   Eyes:     Comments: History of left cataract removal.   Neck:     Thyroid: No thyromegaly.  Cardiovascular:     Rate and Rhythm: Normal rate and regular rhythm.     Pulses: Normal pulses.     Heart sounds: Normal heart sounds.  Pulmonary:     Effort:  Pulmonary effort is normal. No respiratory distress.     Breath sounds: Normal breath sounds.  Abdominal:     General: Bowel sounds are normal. There is no distension.     Palpations: Abdomen is soft.     Tenderness: There is no abdominal tenderness.  Musculoskeletal:     Cervical back: Neck supple.     Right lower leg: No edema.     Left lower leg: No edema.     Comments: Is able to move all extremities  Uses wheelchair  History of left femur ORIF       Lymphadenopathy:     Cervical: No cervical adenopathy.  Skin:    General: Skin is warm and dry.     Comments: Sebaceous cyst without drainage present   Neurological:     Mental Status: She is alert. Mental status is at baseline.  Psychiatric:        Mood and Affect: Mood normal.      ASSESSMENT/ PLAN:  TODAY:   1. Colon cancer: is without change will monitor  2. Unspecified type dementia without behavioral disturbance: is without change weight is 102 pounds will monitor   3. Chronic idiopathic constipation: is stable is off medications will monitor    PREVIOUS   4.  Left femoral shaft fracture: history of left femur ORIF: is stable has tylenol as needed for pain will monitor   5. Unspecified iron deficiency anemia: is stable hgb 10.2 will continue iron daily   6. CKD (chronic kidney disease ) stage IV: is table bun 50 creat 2.08 will monitor   7. Benign hypertension with CKD (Chronic kidney disease) stage IV: is stable b/p 120/65 will continue lopressor 50 mg twice daily norvasc 5 mg daily   8. Vitamin D deficiency: is stable vit D 22.50 will continue vit D 50,000 units weekly       MD is aware of resident's narcotic use and is in agreement with current  plan of care. We will attempt to wean resident as appropriate.  Ok Edwards NP [facility]  Contact [phone] Monday through Friday 8am- 5pm  After hours call [phone]

## 2020-03-12 ENCOUNTER — Non-Acute Institutional Stay (SKILLED_NURSING_FACILITY): Payer: Medicare HMO | Admitting: Adult Health

## 2020-03-12 ENCOUNTER — Encounter: Payer: Self-pay | Admitting: Adult Health

## 2020-03-12 DIAGNOSIS — I129 Hypertensive chronic kidney disease with stage 1 through stage 4 chronic kidney disease, or unspecified chronic kidney disease: Secondary | ICD-10-CM

## 2020-03-12 DIAGNOSIS — F0390 Unspecified dementia without behavioral disturbance: Secondary | ICD-10-CM | POA: Diagnosis not present

## 2020-03-12 DIAGNOSIS — N184 Chronic kidney disease, stage 4 (severe): Secondary | ICD-10-CM | POA: Diagnosis not present

## 2020-03-12 NOTE — Progress Notes (Signed)
Location:    [place] Room Number: 113/D Place of Service:  SNF (31)   CODE STATUS: DNR No Known Allergies  Chief Complaint  Patient presents with  . Acute Visit    Care Plan Meeting    HPI:  We have come together for her care plan meeting. Unable to do BIMS mood 3/30. She has had one fall without injury. Her weight is stable. She continues to be total care with her adls. There are no reports of pain; no reports of behavioral issues. She continues to be followed for her chronic illnesses including: dementia without behavioral disturbance unspecified dementia typ  Chronic kidney disease stage IV  Benign hypertensive disease with CKD (chronic kidney disease) stage IV   Past Medical History:  Diagnosis Date  . Anemia   . Arthritis   . History of blood transfusion    "today is the first time" (08/05/2014)  . Hypertension   . Left femoral shaft fracture ([place]) 08/08/2014    Past Surgical History:  Procedure Laterality Date  . APPENDECTOMY    . CATARACT EXTRACTION Left   . CHOLECYSTECTOMY    . COLONOSCOPY N/A 09/26/2014   Procedure: COLONOSCOPY;  Surgeon: [name], MD;  [facility];  Service: Endoscopy;  Laterality: N/A;  1030am  . CYSTECTOMY     "had cyst taken off lower back"  . ESOPHAGOGASTRODUODENOSCOPY N/A 08/22/2014   WUX:[id number] ring at the gastro junction/small HH  . ORIF FEMUR FRACTURE Left 08/07/2014   Procedure: OPEN REDUCTION INTERNAL FIXATION (ORIF)  LEFT FEMUR FRACTURE;  Surgeon: [name], MD;  Location: [place];  Service: Orthopedics;  Laterality: Left;  . TONSILLECTOMY      Social History   Socioeconomic History  . Marital status: Widowed    Spouse name: Not on file  . Number of children: Not on file  . Years of education: Not on file  . Highest education level: Not on file  Occupational History  . Occupation: retired   Tobacco Use  . Smoking status: Former Smoker    Packs/day: 0.25    Years: 16.00     Pack years: 4.00    Types: Cigarettes  . Smokeless tobacco: Never Used  Vaping Use  . Vaping Use: Never used  Substance and Sexual Activity  . Alcohol use: No  . Drug use: No  . Sexual activity: Never  Other Topics Concern  . Not on file  Social History Narrative   Long term resident of [facility]    Social Determinants of Health   Financial Resource Strain:   . Difficulty of Paying Living Expenses:   Food Insecurity:   . Worried About Charity fundraiser in the Last Year:   . Arboriculturist in the Last Year:   Transportation Needs:   . Film/video editor (Medical):   Marland Kitchen Lack of Transportation (Non-Medical):   Physical Activity:   . Days of Exercise per Week:   . Minutes of Exercise per Session:   Stress:   . Feeling of Stress :   Social Connections: Unknown  . Frequency of Communication with Friends and Family: Not on file  . Frequency of Social Gatherings with Friends and Family: Never  . Attends Religious Services: Not on file  . Active Member of Clubs or Organizations: Not on file  . Attends Archivist Meetings: Not on file  . Marital Status: Not on file  Intimate Partner Violence:   . Fear  of Current or Ex-Partner:   . Emotionally Abused:   Marland Kitchen Physically Abused:   . Sexually Abused:    Family History  Family history unknown: Yes      VITAL SIGNS BP (!) 132/57   Pulse (!) 52   Temp 98.2 F (36.8 C) (Oral)   Ht 4\' 11"  (1.499 m)   Wt 99 lb 9.6 oz (45.2 kg)   BMI 20.12 kg/m   Outpatient Encounter Medications as of 03/12/2020  Medication Sig  . acetaminophen (TYLENOL) 325 MG tablet Take 650 mg by mouth every 6 (six) hours as needed.  Marland Kitchen amLODipine (NORVASC) 5 MG tablet Take 5 mg by mouth daily.   . Cholecalciferol 1.25 MG (50000 UT) capsule Take 50,000 Units by mouth once a week. On Friday  . ferrous sulfate 325 (65 FE) MG tablet Take 325 mg by mouth daily with breakfast.   . metoprolol (LOPRESSOR) 50 MG tablet Take 1 tablet (50 mg total) by mouth 2  (two) times daily.  . NON FORMULARY Diet Change: Dysphagia 1 (puree), continue thin liquids  . ondansetron (ZOFRAN) 4 MG tablet Take 4 mg by mouth 2 (two) times daily as needed.   Vladimir Faster Glycol-Propyl Glycol (SYSTANE) 0.4-0.3 % GEL ophthalmic gel Place 1 application into both eyes 2 (two) times daily at 10 AM and 5 PM.  . sodium bicarbonate 650 MG tablet Take 650 mg by mouth 2 (two) times daily.   No facility-administered encounter medications on file as of 03/12/2020.     SIGNIFICANT DIAGNOSTIC EXAMS    PREVIOUS;   04-28-19: right hip and pelvic x-ray: There is no acute fracture or subluxation. Dense atherosclerotic calcification of the femoral arteries. Degenerative changes are seen in the LOWER lumbar spine. Coarse calcifications in the pelvis are consistent with calcified fibroids. Bowel sutures are noted in the LOWER pelvis  NO NEW EXAMS.    LABS REVIEWED PREVIOUS;   04-28-19: wbc 6.1; hgb 10.6; hct 35.4; mcv 85.9; plt 336; glucose 107; bun 61; creat 1.91; k+ 4.7; na++ 138; ca 9.4  05-17-19: glucose 95; bun 55; creat 2.14; k+ 5.1; na++ 136; ca 9.1; liver normal albumin 4.0 tsh 2.740 05-25-19: wbc 5.7; hgb 10.8; hct 35.6; mcv 85.6 plt 318; glucose 102; bun 52; creat 1.74; k+ 4.6; na++ 139; ca 9.5; liver normal albumin 4.0   07-21-19: wbc 5.3; hgb 10.2; hct 32.6; mcv 84.5 plt 271; glucose 118; bun 50; creat 2.08; k+ 4.1; na++ 138; ca 8.8; liver normal albumin 3.5; tsh 0.943; urine culture: no growth   12-05-19: vit D 22.50 01-30-20: wbc 6.3; hgb 10.8; hct 35.1; mcv 86.0 plt 284; glucose 95; bun 52; creat 2.04; k+ 3.9; na++ 136; ca 9.3 liver normal albumin 4.0 tsh 2.055  NO NEW LABS.    Review of Systems  Unable to perform ROS: Dementia (unable to participate )    Physical Exam Constitutional:      General: She is not in acute distress.    Appearance: She is well-developed. She is not diaphoretic.  Neck:     Thyroid: No thyromegaly.  Cardiovascular:     Rate and Rhythm:  Normal rate and regular rhythm.     Pulses: Normal pulses.     Heart sounds: Normal heart sounds.  Pulmonary:     Effort: Pulmonary effort is normal. No respiratory distress.     Breath sounds: Normal breath sounds.  Abdominal:     General: Bowel sounds are normal. There is no distension.  Palpations: Abdomen is soft.     Tenderness: There is no abdominal tenderness.  Musculoskeletal:     Cervical back: Neck supple.     Right lower leg: No edema.     Left lower leg: No edema.     Comments: Is able to move all extremities  Uses wheelchair  History of left femur ORIF        Lymphadenopathy:     Cervical: No cervical adenopathy.  Skin:    General: Skin is warm and dry.  Neurological:     Mental Status: She is alert. Mental status is at baseline.  Psychiatric:        Mood and Affect: Mood normal.       ASSESSMENT/ PLAN:  TODAY  1. dementia without behavioral disturbance unspecified dementia type 2. Chronic kidney disease stage IV 3. Benign hypertensive disease with CKD (chronic kidney disease) stage IV  Will continue current medications Will conitnue current plan of care Will continue to monitor her status.   MD is aware of resident's narcotic use and is in agreement with current plan of care. We will attempt to wean resident as appropriate.  Ok Edwards NP [facility]  Contact [phone] Monday through Friday 8am- 5pm  After hours call [phone]

## 2020-03-19 ENCOUNTER — Encounter: Payer: Self-pay | Admitting: Adult Health

## 2020-03-19 ENCOUNTER — Non-Acute Institutional Stay (SKILLED_NURSING_FACILITY): Payer: Medicare HMO | Admitting: Adult Health

## 2020-03-19 DIAGNOSIS — S72355D Nondisplaced comminuted fracture of shaft of left femur, subsequent encounter for closed fracture with routine healing: Secondary | ICD-10-CM

## 2020-03-19 DIAGNOSIS — N184 Chronic kidney disease, stage 4 (severe): Secondary | ICD-10-CM | POA: Diagnosis not present

## 2020-03-19 DIAGNOSIS — D509 Iron deficiency anemia, unspecified: Secondary | ICD-10-CM | POA: Diagnosis not present

## 2020-03-19 NOTE — Progress Notes (Signed)
Location:    [place] Room Number: 113/D Place of Service:  SNF (31)   CODE STATUS: DNR  No Known Allergies  Chief Complaint  Patient presents with  . Medical Management of Chronic Issues          Left femoral shaft fracture Unspecified iron deficiency anemia:CKD (chronic kidney disease) stage IV:    HPI:  She is a 86 year old long term resident of this facility being seen for the management of her chronic illnesses:Left femoral shaft fracture:  Unspecified iron deficiency anemia:  CKD (chronic kidney disease) stage IV: there are no reports of uncontrolled pain; no reports of anxiety or agitation. She is slowly losing weight. This is an unfortunate outcome in the late stages of dementia.   Past Medical History:  Diagnosis Date  . Anemia   . Arthritis   . History of blood transfusion    "today is the first time" (08/05/2014)  . Hypertension   . Left femoral shaft fracture ([place]) 08/08/2014    Past Surgical History:  Procedure Laterality Date  . APPENDECTOMY    . CATARACT EXTRACTION Left   . CHOLECYSTECTOMY    . COLONOSCOPY N/A 09/26/2014   Procedure: COLONOSCOPY;  Surgeon: [name], MD;  [facility];  Service: Endoscopy;  Laterality: N/A;  1030am  . CYSTECTOMY     "had cyst taken off lower back"  . ESOPHAGOGASTRODUODENOSCOPY N/A 08/22/2014   VWU:[id number] ring at the gastro junction/small HH  . ORIF FEMUR FRACTURE Left 08/07/2014   Procedure: OPEN REDUCTION INTERNAL FIXATION (ORIF)  LEFT FEMUR FRACTURE;  Surgeon: [name], MD;  Location: [place];  Service: Orthopedics;  Laterality: Left;  . TONSILLECTOMY      Social History   Socioeconomic History  . Marital status: Widowed    Spouse name: Not on file  . Number of children: Not on file  . Years of education: Not on file  . Highest education level: Not on file  Occupational History  . Occupation: retired   Tobacco Use  . Smoking status: Former Smoker    Packs/day: 0.25     Years: 16.00    Pack years: 4.00    Types: Cigarettes  . Smokeless tobacco: Never Used  Vaping Use  . Vaping Use: Never used  Substance and Sexual Activity  . Alcohol use: No  . Drug use: No  . Sexual activity: Never  Other Topics Concern  . Not on file  Social History Narrative   Long term resident of [facility]    Social Determinants of Health   Financial Resource Strain:   . Difficulty of Paying Living Expenses:   Food Insecurity:   . Worried About Charity fundraiser in the Last Year:   . Arboriculturist in the Last Year:   Transportation Needs:   . Film/video editor (Medical):   Marland Kitchen Lack of Transportation (Non-Medical):   Physical Activity:   . Days of Exercise per Week:   . Minutes of Exercise per Session:   Stress:   . Feeling of Stress :   Social Connections: Unknown  . Frequency of Communication with Friends and Family: Not on file  . Frequency of Social Gatherings with Friends and Family: Never  . Attends Religious Services: Not on file  . Active Member of Clubs or Organizations: Not on file  . Attends Archivist Meetings: Not on file  . Marital Status: Not on file  Intimate Partner  Violence:   . Fear of Current or Ex-Partner:   . Emotionally Abused:   Marland Kitchen Physically Abused:   . Sexually Abused:    Family History  Family history unknown: Yes      VITAL SIGNS BP (!) 150/66   Pulse 61   Temp 98.2 F (36.8 C) (Oral)   Resp 18   Ht 4\' 11"  (1.499 m)   Wt 99 lb 9.6 oz (45.2 kg)   BMI 20.12 kg/m   Outpatient Encounter Medications as of 03/19/2020  Medication Sig  . acetaminophen (TYLENOL) 325 MG tablet Take 650 mg by mouth every 6 (six) hours as needed.  Marland Kitchen amLODipine (NORVASC) 5 MG tablet Take 5 mg by mouth daily.   . Cholecalciferol 1.25 MG (50000 UT) capsule Take 50,000 Units by mouth once a week. On Friday  . ferrous sulfate 325 (65 FE) MG tablet Take 325 mg by mouth daily with breakfast.   . metoprolol (LOPRESSOR) 50 MG tablet Take 1  tablet (50 mg total) by mouth 2 (two) times daily.  . NON FORMULARY Diet Change: Dysphagia 1 (puree), continue thin liquids  . ondansetron (ZOFRAN) 4 MG tablet Take 4 mg by mouth 2 (two) times daily as needed.   Vladimir Faster Glycol-Propyl Glycol (SYSTANE) 0.4-0.3 % GEL ophthalmic gel Place 1 application into both eyes 2 (two) times daily at 10 AM and 5 PM.  . sodium bicarbonate 650 MG tablet Take 650 mg by mouth 2 (two) times daily.   No facility-administered encounter medications on file as of 03/19/2020.     SIGNIFICANT DIAGNOSTIC EXAMS  PREVIOUS;   04-28-19: right hip and pelvic x-ray: There is no acute fracture or subluxation. Dense atherosclerotic calcification of the femoral arteries. Degenerative changes are seen in the LOWER lumbar spine. Coarse calcifications in the pelvis are consistent with calcified fibroids. Bowel sutures are noted in the LOWER pelvis  NO NEW EXAMS.    LABS REVIEWED PREVIOUS;   04-28-19: wbc 6.1; hgb 10.6; hct 35.4; mcv 85.9; plt 336; glucose 107; bun 61; creat 1.91; k+ 4.7; na++ 138; ca 9.4  05-17-19: glucose 95; bun 55; creat 2.14; k+ 5.1; na++ 136; ca 9.1; liver normal albumin 4.0 tsh 2.740 05-25-19: wbc 5.7; hgb 10.8; hct 35.6; mcv 85.6 plt 318; glucose 102; bun 52; creat 1.74; k+ 4.6; na++ 139; ca 9.5; liver normal albumin 4.0   07-21-19: wbc 5.3; hgb 10.2; hct 32.6; mcv 84.5 plt 271; glucose 118; bun 50; creat 2.08; k+ 4.1; na++ 138; ca 8.8; liver normal albumin 3.5; tsh 0.943; urine culture: no growth   12-05-19: vit D 22.50 01-30-20: wbc 6.3; hgb 10.8; hct 35.1; mcv 86.0 plt 284; glucose 95; bun 52; creat 2.04; k+ 3.9; na++ 136; ca 9.3 liver normal albumin 4.0 tsh 2.055   NO NEW LABS.    Review of Systems  Unable to perform ROS: Dementia (unable to participate )   Physical Exam Constitutional:      General: She is not in acute distress.    Appearance: She is underweight. She is not diaphoretic.  Neck:     Thyroid: No thyromegaly.  Cardiovascular:      Rate and Rhythm: Normal rate and regular rhythm.     Pulses: Normal pulses.     Heart sounds: Normal heart sounds.  Pulmonary:     Effort: Pulmonary effort is normal. No respiratory distress.     Breath sounds: Normal breath sounds.  Abdominal:     General: Bowel sounds are normal. There is  no distension.     Palpations: Abdomen is soft.     Tenderness: There is no abdominal tenderness.  Musculoskeletal:     Cervical back: Neck supple.     Right lower leg: No edema.     Left lower leg: No edema.     Comments:  Is able to move all extremities  Uses wheelchair  History of left femur ORIF         Lymphadenopathy:     Cervical: No cervical adenopathy.  Skin:    General: Skin is warm and dry.  Neurological:     Mental Status: She is alert. Mental status is at baseline.  Psychiatric:        Mood and Affect: Mood normal.      ASSESSMENT/ PLAN:  TODAY:   1. Left femoral shaft fracture: history of left femur ORIF; is stable will monitor  2. Unspecified iron deficiency anemia: is stable hgb 10.2 will continue iron daily   3. CKD (chronic kidney disease) stage IV: is stable bun 52 creat 2.04 will monitor    PREVIOUS   4. Benign hypertension with CKD (Chronic kidney disease) stage IV: is stable b/p 150/66 will continue lopressor 50 mg twice daily norvasc 5 mg daily   5. Vitamin D deficiency: is stable vit D 22.50 will continue vit D 50,000 units weekly   6. Colon cancer: is without change will monitor  7. Unspecified type dementia without behavioral disturbance: is without change weight is 99 pounds will monitor weight loss is an unfortunate outcome at the late stages of this disease process.   8. Chronic idiopathic constipation: is stable is off medications will monitor    MD is aware of resident's narcotic use and is in agreement with current plan of care. We will attempt to wean resident as appropriate.  Ok Edwards NP [facility]  Contact  [phone] Monday through Friday 8am- 5pm  After hours call [phone]

## 2020-04-21 ENCOUNTER — Non-Acute Institutional Stay (SKILLED_NURSING_FACILITY): Payer: Medicare HMO | Admitting: Adult Health

## 2020-04-21 ENCOUNTER — Encounter: Payer: Self-pay | Admitting: Adult Health

## 2020-04-21 DIAGNOSIS — N184 Chronic kidney disease, stage 4 (severe): Secondary | ICD-10-CM | POA: Diagnosis not present

## 2020-04-21 DIAGNOSIS — C189 Malignant neoplasm of colon, unspecified: Secondary | ICD-10-CM | POA: Diagnosis not present

## 2020-04-21 DIAGNOSIS — I129 Hypertensive chronic kidney disease with stage 1 through stage 4 chronic kidney disease, or unspecified chronic kidney disease: Secondary | ICD-10-CM | POA: Diagnosis not present

## 2020-04-21 DIAGNOSIS — E559 Vitamin D deficiency, unspecified: Secondary | ICD-10-CM | POA: Diagnosis not present

## 2020-04-21 NOTE — Progress Notes (Signed)
Location:    [place] Room Number: 113/D Place of Service:  SNF (31)   CODE STATUS: DNR  No Known Allergies  Chief Complaint  Patient presents with  . Medical Management of Chronic Issues           Benign hypertension with CKD (Chronic kidney disease) stage IV:  Vitamin D deficiency  Colon cancer:    HPI:  She is a 86 year old long term resident of this facility being seen for the management of her chronic illnesses ;Benign hypertension with CKD (Chronic kidney disease) stage IV:  Vitamin D deficiency   Colon cancer:   There are no reports of uncontrolled pain; no reports of changes in appetite; weight is stable; no reports of agitation or anxiety.   Past Medical History:  Diagnosis Date  . Anemia   . Arthritis   . History of blood transfusion    "today is the first time" (08/05/2014)  . Hypertension   . Left femoral shaft fracture ([place]) 08/08/2014    Past Surgical History:  Procedure Laterality Date  . APPENDECTOMY    . CATARACT EXTRACTION Left   . CHOLECYSTECTOMY    . COLONOSCOPY N/A 09/26/2014   Procedure: COLONOSCOPY;  Surgeon: [name], MD;  [facility];  Service: Endoscopy;  Laterality: N/A;  1030am  . CYSTECTOMY     "had cyst taken off lower back"  . ESOPHAGOGASTRODUODENOSCOPY N/A 08/22/2014   PJK:[id number] ring at the gastro junction/small HH  . ORIF FEMUR FRACTURE Left 08/07/2014   Procedure: OPEN REDUCTION INTERNAL FIXATION (ORIF)  LEFT FEMUR FRACTURE;  Surgeon: [name], MD;  Location: [place];  Service: Orthopedics;  Laterality: Left;  . TONSILLECTOMY      Social History   Socioeconomic History  . Marital status: Widowed    Spouse name: Not on file  . Number of children: Not on file  . Years of education: Not on file  . Highest education level: Not on file  Occupational History  . Occupation: retired   Tobacco Use  . Smoking status: Former Smoker    Packs/day: 0.25    Years: 16.00    Pack years: 4.00      Types: Cigarettes  . Smokeless tobacco: Never Used  Vaping Use  . Vaping Use: Never used  Substance and Sexual Activity  . Alcohol use: No  . Drug use: No  . Sexual activity: Never  Other Topics Concern  . Not on file  Social History Narrative   Long term resident of [facility]    Social Determinants of Health   Financial Resource Strain:   . Difficulty of Paying Living Expenses:   Food Insecurity:   . Worried About Charity fundraiser in the Last Year:   . Arboriculturist in the Last Year:   Transportation Needs:   . Film/video editor (Medical):   Marland Kitchen Lack of Transportation (Non-Medical):   Physical Activity:   . Days of Exercise per Week:   . Minutes of Exercise per Session:   Stress:   . Feeling of Stress :   Social Connections: Unknown  . Frequency of Communication with Friends and Family: Not on file  . Frequency of Social Gatherings with Friends and Family: Never  . Attends Religious Services: Not on file  . Active Member of Clubs or Organizations: Not on file  . Attends Archivist Meetings: Not on file  . Marital Status: Not on file  Intimate Partner Violence:   . Fear of Current or Ex-Partner:   . Emotionally Abused:   Marland Kitchen Physically Abused:   . Sexually Abused:    Family History  Family history unknown: Yes      VITAL SIGNS BP (!) 146/51   Pulse (!) 57   Temp 98.6 F (37 C) (Oral)   Resp 18   Ht 4\' 11"  (1.499 m)   Wt 99 lb 3.2 oz (45 kg)   BMI 20.04 kg/m   Outpatient Encounter Medications as of 04/21/2020  Medication Sig  . acetaminophen (TYLENOL) 325 MG tablet Take 650 mg by mouth every 6 (six) hours as needed.  Marland Kitchen amLODipine (NORVASC) 5 MG tablet Take 5 mg by mouth daily.   . Cholecalciferol 1.25 MG (50000 UT) capsule Take 50,000 Units by mouth once a week. On Friday  . ferrous sulfate 325 (65 FE) MG tablet Take 325 mg by mouth daily with breakfast.   . metoprolol (LOPRESSOR) 50 MG tablet Take 1 tablet (50 mg total) by mouth 2 (two)  times daily.  . NON FORMULARY Diet Change: Dysphagia 1 (puree), continue thin liquids  . ondansetron (ZOFRAN) 4 MG tablet Take 4 mg by mouth 2 (two) times daily as needed.   Vladimir Faster Glycol-Propyl Glycol (SYSTANE) 0.4-0.3 % GEL ophthalmic gel Place 1 application into both eyes 2 (two) times daily at 10 AM and 5 PM.  . sodium bicarbonate 650 MG tablet Take 650 mg by mouth 2 (two) times daily.   No facility-administered encounter medications on file as of 04/21/2020.     SIGNIFICANT DIAGNOSTIC EXAMS   PREVIOUS;   04-28-19: right hip and pelvic x-ray: There is no acute fracture or subluxation. Dense atherosclerotic calcification of the femoral arteries. Degenerative changes are seen in the LOWER lumbar spine. Coarse calcifications in the pelvis are consistent with calcified fibroids. Bowel sutures are noted in the LOWER pelvis  NO NEW EXAMS.    LABS REVIEWED PREVIOUS;   04-28-19: wbc 6.1; hgb 10.6; hct 35.4; mcv 85.9; plt 336; glucose 107; bun 61; creat 1.91; k+ 4.7; na++ 138; ca 9.4  05-17-19: glucose 95; bun 55; creat 2.14; k+ 5.1; na++ 136; ca 9.1; liver normal albumin 4.0 tsh 2.740 05-25-19: wbc 5.7; hgb 10.8; hct 35.6; mcv 85.6 plt 318; glucose 102; bun 52; creat 1.74; k+ 4.6; na++ 139; ca 9.5; liver normal albumin 4.0   07-21-19: wbc 5.3; hgb 10.2; hct 32.6; mcv 84.5 plt 271; glucose 118; bun 50; creat 2.08; k+ 4.1; na++ 138; ca 8.8; liver normal albumin 3.5; tsh 0.943; urine culture: no growth   12-05-19: vit D 22.50 01-30-20: wbc 6.3; hgb 10.8; hct 35.1; mcv 86.0 plt 284; glucose 95; bun 52; creat 2.04; k+ 3.9; na++ 136; ca 9.3 liver normal albumin 4.0 tsh 2.055   NO NEW LABS.    Review of Systems  Unable to perform ROS: Dementia (unable to participate )    Physical Exam Constitutional:      General: She is not in acute distress.    Appearance: She is well-developed. She is not diaphoretic.  Neck:     Thyroid: No thyromegaly.  Cardiovascular:     Rate and Rhythm: Normal  rate and regular rhythm.     Pulses: Normal pulses.     Heart sounds: Normal heart sounds.  Pulmonary:     Effort: Pulmonary effort is normal. No respiratory distress.     Breath sounds: Normal breath sounds.  Abdominal:     General: Bowel  sounds are normal. There is no distension.     Palpations: Abdomen is soft.     Tenderness: There is no abdominal tenderness.  Musculoskeletal:     Cervical back: Neck supple.     Right lower leg: No edema.     Left lower leg: No edema.     Comments:  Is able to move all extremities  Uses wheelchair  History of left femur ORIF          Lymphadenopathy:     Cervical: No cervical adenopathy.  Skin:    General: Skin is warm and dry.  Neurological:     Mental Status: She is alert. Mental status is at baseline.  Psychiatric:        Mood and Affect: Mood normal.      ASSESSMENT/ PLAN:  TODAY:   1. Benign hypertension with CKD (Chronic kidney disease) stage IV: is stable b/p 146/51 will continue lopressor 50 mg twice daily norvasc 5 mg daily   2. Vitamin D deficiency stable level 22.50 will continue vit D 50,000 units weekly  3. Colon cancer: without change will monitor   PREVIOUS   4. Unspecified type dementia without behavioral disturbance: is without change weight is 99 pounds will monitor weight loss is an unfortunate outcome at the late stages of this disease process.   5. Chronic idiopathic constipation: is stable is off medications will monitor   6. Left femoral shaft fracture: history of left femur ORIF; is stable will monitor  7. Unspecified iron deficiency anemia: is stable hgb 10.8 will continue iron daily   8. CKD (chronic kidney disease) stage IV: is stable bun 52 creat 2.04 will monitor      MD is aware of resident's narcotic use and is in agreement with current plan of care. We will attempt to wean resident as appropriate.  Ok Edwards NP [facility]  Contact [phone] Monday through Friday 8am- 5pm   After hours call [phone] .

## 2020-04-23 DIAGNOSIS — I129 Hypertensive chronic kidney disease with stage 1 through stage 4 chronic kidney disease, or unspecified chronic kidney disease: Secondary | ICD-10-CM | POA: Diagnosis not present

## 2020-04-23 DIAGNOSIS — Z1159 Encounter for screening for other viral diseases: Secondary | ICD-10-CM | POA: Diagnosis not present

## 2020-04-27 DIAGNOSIS — I129 Hypertensive chronic kidney disease with stage 1 through stage 4 chronic kidney disease, or unspecified chronic kidney disease: Secondary | ICD-10-CM | POA: Diagnosis not present

## 2020-04-27 DIAGNOSIS — Z1159 Encounter for screening for other viral diseases: Secondary | ICD-10-CM | POA: Diagnosis not present

## 2020-05-01 DIAGNOSIS — M2142 Flat foot [pes planus] (acquired), left foot: Secondary | ICD-10-CM | POA: Diagnosis not present

## 2020-05-01 DIAGNOSIS — M2141 Flat foot [pes planus] (acquired), right foot: Secondary | ICD-10-CM | POA: Diagnosis not present

## 2020-05-01 DIAGNOSIS — B351 Tinea unguium: Secondary | ICD-10-CM | POA: Diagnosis not present

## 2020-05-01 DIAGNOSIS — L603 Nail dystrophy: Secondary | ICD-10-CM | POA: Diagnosis not present

## 2020-05-01 DIAGNOSIS — I739 Peripheral vascular disease, unspecified: Secondary | ICD-10-CM | POA: Diagnosis not present

## 2020-05-04 DIAGNOSIS — I129 Hypertensive chronic kidney disease with stage 1 through stage 4 chronic kidney disease, or unspecified chronic kidney disease: Secondary | ICD-10-CM | POA: Diagnosis not present

## 2020-05-04 DIAGNOSIS — Z1159 Encounter for screening for other viral diseases: Secondary | ICD-10-CM | POA: Diagnosis not present

## 2020-05-07 DIAGNOSIS — Z1159 Encounter for screening for other viral diseases: Secondary | ICD-10-CM | POA: Diagnosis not present

## 2020-05-07 DIAGNOSIS — I129 Hypertensive chronic kidney disease with stage 1 through stage 4 chronic kidney disease, or unspecified chronic kidney disease: Secondary | ICD-10-CM | POA: Diagnosis not present

## 2020-05-11 DIAGNOSIS — Z1159 Encounter for screening for other viral diseases: Secondary | ICD-10-CM | POA: Diagnosis not present

## 2020-05-11 DIAGNOSIS — I129 Hypertensive chronic kidney disease with stage 1 through stage 4 chronic kidney disease, or unspecified chronic kidney disease: Secondary | ICD-10-CM | POA: Diagnosis not present

## 2020-05-14 DIAGNOSIS — I129 Hypertensive chronic kidney disease with stage 1 through stage 4 chronic kidney disease, or unspecified chronic kidney disease: Secondary | ICD-10-CM | POA: Diagnosis not present

## 2020-05-14 DIAGNOSIS — Z1159 Encounter for screening for other viral diseases: Secondary | ICD-10-CM | POA: Diagnosis not present

## 2020-05-18 DIAGNOSIS — Z1159 Encounter for screening for other viral diseases: Secondary | ICD-10-CM | POA: Diagnosis not present

## 2020-05-18 DIAGNOSIS — I129 Hypertensive chronic kidney disease with stage 1 through stage 4 chronic kidney disease, or unspecified chronic kidney disease: Secondary | ICD-10-CM | POA: Diagnosis not present

## 2020-05-21 DIAGNOSIS — I129 Hypertensive chronic kidney disease with stage 1 through stage 4 chronic kidney disease, or unspecified chronic kidney disease: Secondary | ICD-10-CM | POA: Diagnosis not present

## 2020-05-21 DIAGNOSIS — Z1159 Encounter for screening for other viral diseases: Secondary | ICD-10-CM | POA: Diagnosis not present

## 2020-05-25 DIAGNOSIS — I129 Hypertensive chronic kidney disease with stage 1 through stage 4 chronic kidney disease, or unspecified chronic kidney disease: Secondary | ICD-10-CM | POA: Diagnosis not present

## 2020-05-25 DIAGNOSIS — Z1159 Encounter for screening for other viral diseases: Secondary | ICD-10-CM | POA: Diagnosis not present

## 2020-05-27 ENCOUNTER — Non-Acute Institutional Stay (SKILLED_NURSING_FACILITY): Payer: Medicare HMO | Admitting: Adult Health

## 2020-05-27 ENCOUNTER — Encounter: Payer: Self-pay | Admitting: Adult Health

## 2020-05-27 DIAGNOSIS — F0390 Unspecified dementia without behavioral disturbance: Secondary | ICD-10-CM | POA: Diagnosis not present

## 2020-05-27 DIAGNOSIS — K5904 Chronic idiopathic constipation: Secondary | ICD-10-CM

## 2020-05-27 DIAGNOSIS — S72355D Nondisplaced comminuted fracture of shaft of left femur, subsequent encounter for closed fracture with routine healing: Secondary | ICD-10-CM | POA: Diagnosis not present

## 2020-05-27 NOTE — Progress Notes (Signed)
Location:    [place] Room Number: 113/D Place of Service:  SNF (31)   CODE STATUS: DNR  No Known Allergies  Chief Complaint  Patient presents with  . Medical Management of Chronic Issues              Unspecified type dementia without behavioral disturbance     Chronic idiopathic constipation:     Left femoral shaft femur    HPI:  She is a 86 year old long term resident of this facility being seen for the management of her chronic illnesses:Unspecified type dementia without behavioral disturbance     Chronic idiopathic constipation:     Left femoral shaft femur. There are no reports of uncontrolled pain; no reports of agitation or anxiety; no reports of constipation.    Past Medical History:  Diagnosis Date  . Anemia   . Arthritis   . History of blood transfusion    "today is the first time" (08/05/2014)  . Hypertension   . Left femoral shaft fracture ([place]) 08/08/2014    Past Surgical History:  Procedure Laterality Date  . APPENDECTOMY    . CATARACT EXTRACTION Left   . CHOLECYSTECTOMY    . COLONOSCOPY N/A 09/26/2014   Procedure: COLONOSCOPY;  Surgeon: [name], MD;  [facility];  Service: Endoscopy;  Laterality: N/A;  1030am  . CYSTECTOMY     "had cyst taken off lower back"  . ESOPHAGOGASTRODUODENOSCOPY N/A 08/22/2014   KPT:[id number] ring at the gastro junction/small HH  . ORIF FEMUR FRACTURE Left 08/07/2014   Procedure: OPEN REDUCTION INTERNAL FIXATION (ORIF)  LEFT FEMUR FRACTURE;  Surgeon: [name], MD;  Location: [place];  Service: Orthopedics;  Laterality: Left;  . TONSILLECTOMY      Social History   Socioeconomic History  . Marital status: Widowed    Spouse name: Not on file  . Number of children: Not on file  . Years of education: Not on file  . Highest education level: Not on file  Occupational History  . Occupation: retired   Tobacco Use  . Smoking status: Former Smoker    Packs/day: 0.25    Years: 16.00     Pack years: 4.00    Types: Cigarettes  . Smokeless tobacco: Never Used  Vaping Use  . Vaping Use: Never used  Substance and Sexual Activity  . Alcohol use: No  . Drug use: No  . Sexual activity: Never  Other Topics Concern  . Not on file  Social History Narrative   Long term resident of [facility]    Social Determinants of Health   Financial Resource Strain:   . Difficulty of Paying Living Expenses: Not on file  Food Insecurity:   . Worried About Charity fundraiser in the Last Year: Not on file  . Ran Out of Food in the Last Year: Not on file  Transportation Needs:   . Lack of Transportation (Medical): Not on file  . Lack of Transportation (Non-Medical): Not on file  Physical Activity:   . Days of Exercise per Week: Not on file  . Minutes of Exercise per Session: Not on file  Stress:   . Feeling of Stress : Not on file  Social Connections: Unknown  . Frequency of Communication with Friends and Family: Not on file  . Frequency of Social Gatherings with Friends and Family: Never  . Attends Religious Services: Not on file  . Active Member of Clubs or Organizations: Not  on file  . Attends Archivist Meetings: Not on file  . Marital Status: Not on file  Intimate Partner Violence:   . Fear of Current or Ex-Partner: Not on file  . Emotionally Abused: Not on file  . Physically Abused: Not on file  . Sexually Abused: Not on file   Family History  Family history unknown: Yes      VITAL SIGNS BP 120/74   Pulse 74   Temp 98.1 F (36.7 C)   Resp 18   Ht 4\' 11"  (1.499 m)   Wt 95 lb 3.2 oz (43.2 kg)   SpO2 95%   BMI 19.23 kg/m   Outpatient Encounter Medications as of 05/27/2020  Medication Sig  . acetaminophen (TYLENOL) 325 MG tablet Take 650 mg by mouth every 6 (six) hours as needed.  Marland Kitchen amLODipine (NORVASC) 5 MG tablet Take 5 mg by mouth daily.   . Cholecalciferol 1.25 MG (50000 UT) capsule Take 50,000 Units by mouth once a week. On Friday  . ferrous sulfate 325  (65 FE) MG tablet Take 325 mg by mouth daily with breakfast.   . metoprolol (LOPRESSOR) 50 MG tablet Take 1 tablet (50 mg total) by mouth 2 (two) times daily.  . NON FORMULARY Diet Change: Dysphagia 1 (puree), continue thin liquids  . ondansetron (ZOFRAN) 4 MG tablet Take 4 mg by mouth 2 (two) times daily as needed.   Vladimir Faster Glycol-Propyl Glycol (SYSTANE) 0.4-0.3 % GEL ophthalmic gel Place 1 application into both eyes 2 (two) times daily at 10 AM and 5 PM.  . sodium bicarbonate 650 MG tablet Take 650 mg by mouth 2 (two) times daily.   No facility-administered encounter medications on file as of 05/27/2020.     SIGNIFICANT DIAGNOSTIC EXAMS   PREVIOUS;   04-28-19: right hip and pelvic x-ray: There is no acute fracture or subluxation. Dense atherosclerotic calcification of the femoral arteries. Degenerative changes are seen in the LOWER lumbar spine. Coarse calcifications in the pelvis are consistent with calcified fibroids. Bowel sutures are noted in the LOWER pelvis  NO NEW EXAMS.    LABS REVIEWED PREVIOUS;   05-17-19: glucose 95; bun 55; creat 2.14; k+ 5.1; na++ 136; ca 9.1; liver normal albumin 4.0 tsh 2.740 05-25-19: wbc 5.7; hgb 10.8; hct 35.6; mcv 85.6 plt 318; glucose 102; bun 52; creat 1.74; k+ 4.6; na++ 139; ca 9.5; liver normal albumin 4.0   07-21-19: wbc 5.3; hgb 10.2; hct 32.6; mcv 84.5 plt 271; glucose 118; bun 50; creat 2.08; k+ 4.1; na++ 138; ca 8.8; liver normal albumin 3.5; tsh 0.943; urine culture: no growth   12-05-19: vit D 22.50 01-30-20: wbc 6.3; hgb 10.8; hct 35.1; mcv 86.0 plt 284; glucose 95; bun 52; creat 2.04; k+ 3.9; na++ 136; ca 9.3 liver normal albumin 4.0 tsh 2.055   NO NEW LABS.    Review of Systems  Unable to perform ROS: Dementia (unable to participate )    Physical Exam Constitutional:      General: She is not in acute distress.    Appearance: She is well-developed. She is not diaphoretic.  Neck:     Thyroid: No thyromegaly.  Cardiovascular:      Rate and Rhythm: Normal rate and regular rhythm.     Pulses: Normal pulses.     Heart sounds: Normal heart sounds.  Pulmonary:     Effort: Pulmonary effort is normal. No respiratory distress.     Breath sounds: Normal breath sounds.  Abdominal:  General: Bowel sounds are normal. There is no distension.     Palpations: Abdomen is soft.     Tenderness: There is no abdominal tenderness.  Musculoskeletal:     Cervical back: Neck supple.     Right lower leg: No edema.     Left lower leg: No edema.     Comments: Is able to move all extremities  Uses wheelchair  History of left femur ORIF           Lymphadenopathy:     Cervical: No cervical adenopathy.  Skin:    General: Skin is warm and dry.  Neurological:     Mental Status: She is alert. Mental status is at baseline.  Psychiatric:        Mood and Affect: Mood normal.     ASSESSMENT/ PLAN:  TODAY:   1. Unspecified type dementia without behavioral disturbance is without change weight is 95 pound will not make changes. She is slowly losing weight which is an unfortunate but expected outcome at the late stages of this disease process.   2. Chronic idiopathic constipation: is stable off medications will monitor  3. Left femoral shaft femur history of left femur ORIF is stable will monitor    PREVIOUS   4. Unspecified iron deficiency anemia: is stable hgb 10.8 will continue iron daily   5. CKD (chronic kidney disease) stage IV: is stable bun 52 creat 2.04 will monitor   6. Benign hypertension with CKD (Chronic kidney disease) stage IV: is stable b/p 120/74 will continue lopressor 50 mg twice daily norvasc 5 mg daily   7. Vitamin D deficiency stable level 22.50 will continue vit D 50,000 units weekly  8. Colon cancer: without change will monitor      MD is aware of resident's narcotic use and is in agreement with current plan of care. We will attempt to wean resident as appropriate.  Ok Edwards NP [facility] Adult  Medicine  Contact [phone] Monday through Friday 8am- 5pm  After hours call [phone]

## 2020-06-04 ENCOUNTER — Non-Acute Institutional Stay (SKILLED_NURSING_FACILITY): Payer: Medicare HMO | Admitting: Adult Health

## 2020-06-04 ENCOUNTER — Encounter: Payer: Self-pay | Admitting: Adult Health

## 2020-06-04 DIAGNOSIS — F0390 Unspecified dementia without behavioral disturbance: Secondary | ICD-10-CM | POA: Diagnosis not present

## 2020-06-04 DIAGNOSIS — R627 Adult failure to thrive: Secondary | ICD-10-CM

## 2020-06-04 DIAGNOSIS — I129 Hypertensive chronic kidney disease with stage 1 through stage 4 chronic kidney disease, or unspecified chronic kidney disease: Secondary | ICD-10-CM | POA: Diagnosis not present

## 2020-06-04 DIAGNOSIS — N184 Chronic kidney disease, stage 4 (severe): Secondary | ICD-10-CM

## 2020-06-04 NOTE — Progress Notes (Signed)
Location:    [place] Room Number: 113/D Place of Service:  SNF (31)   CODE STATUS: DNR  No Known Allergies  Chief Complaint  Patient presents with  . Acute Visit    Care Plan Meeting    HPI:  We have come together for her care plan meeting. No BIMS; mood 5/30. She has had one fall without injury. She requires extensive assist with adls. Is frequently incontinent of bladder and bowel. She is slowly losing weight her po intake is stable her weight is now 95 pounds normal weight is 98-103 pounds. There are no reports of pain; agitation or anxiety. She continues to be followed for her chronic illnesses including: Failure to thrive in adult  Benign hypertension with stage IV CKD (chronic kidney disease)  Dementia without behavioral disturbance unspecified dementia type  Past Medical History:  Diagnosis Date  . Anemia   . Arthritis   . History of blood transfusion    "today is the first time" (08/05/2014)  . Hypertension   . Left femoral shaft fracture ([place]) 08/08/2014    Past Surgical History:  Procedure Laterality Date  . APPENDECTOMY    . CATARACT EXTRACTION Left   . CHOLECYSTECTOMY    . COLONOSCOPY N/A 09/26/2014   Procedure: COLONOSCOPY;  Surgeon: [name], MD;  [facility];  Service: Endoscopy;  Laterality: N/A;  1030am  . CYSTECTOMY     "had cyst taken off lower back"  . ESOPHAGOGASTRODUODENOSCOPY N/A 08/22/2014   NOB:[id number] ring at the gastro junction/small HH  . ORIF FEMUR FRACTURE Left 08/07/2014   Procedure: OPEN REDUCTION INTERNAL FIXATION (ORIF)  LEFT FEMUR FRACTURE;  Surgeon: [name], MD;  Location: [place];  Service: Orthopedics;  Laterality: Left;  . TONSILLECTOMY      Social History   Socioeconomic History  . Marital status: Widowed    Spouse name: Not on file  . Number of children: Not on file  . Years of education: Not on file  . Highest education level: Not on file  Occupational History  .  Occupation: retired   Tobacco Use  . Smoking status: Former Smoker    Packs/day: 0.25    Years: 16.00    Pack years: 4.00    Types: Cigarettes  . Smokeless tobacco: Never Used  Vaping Use  . Vaping Use: Never used  Substance and Sexual Activity  . Alcohol use: No  . Drug use: No  . Sexual activity: Never  Other Topics Concern  . Not on file  Social History Narrative   Long term resident of [facility]    Social Determinants of Health   Financial Resource Strain:   . Difficulty of Paying Living Expenses: Not on file  Food Insecurity:   . Worried About Charity fundraiser in the Last Year: Not on file  . Ran Out of Food in the Last Year: Not on file  Transportation Needs:   . Lack of Transportation (Medical): Not on file  . Lack of Transportation (Non-Medical): Not on file  Physical Activity:   . Days of Exercise per Week: Not on file  . Minutes of Exercise per Session: Not on file  Stress:   . Feeling of Stress : Not on file  Social Connections: Unknown  . Frequency of Communication with Friends and Family: Not on file  . Frequency of Social Gatherings with Friends and Family: Never  . Attends Religious Services: Not on file  . Active Member  of Clubs or Organizations: Not on file  . Attends Archivist Meetings: Not on file  . Marital Status: Not on file  Intimate Partner Violence:   . Fear of Current or Ex-Partner: Not on file  . Emotionally Abused: Not on file  . Physically Abused: Not on file  . Sexually Abused: Not on file   Family History  Family history unknown: Yes      VITAL SIGNS BP (!) 145/50   Pulse (!) 53   Temp 98.4 F (36.9 C)   Resp 20   Ht 4\' 11"  (1.499 m)   Wt 95 lb 3.2 oz (43.2 kg)   BMI 19.23 kg/m   Outpatient Encounter Medications as of 06/04/2020  Medication Sig  . acetaminophen (TYLENOL) 325 MG tablet Take 650 mg by mouth every 6 (six) hours as needed.  Marland Kitchen amLODipine (NORVASC) 5 MG tablet Take 5 mg by mouth daily.   .  Cholecalciferol 1.25 MG (50000 UT) capsule Take 50,000 Units by mouth once a week. On Friday  . ferrous sulfate 325 (65 FE) MG tablet Take 325 mg by mouth daily with breakfast.   . metoprolol (LOPRESSOR) 50 MG tablet Take 1 tablet (50 mg total) by mouth 2 (two) times daily.  . NON FORMULARY Diet Change: Dysphagia 1 (puree), continue thin liquids  . NON FORMULARY Wanderguard for safety awareness. Check placement and function qshift. Alert tag number [id number]. Every Shift Day, Evening, Night  . ondansetron (ZOFRAN) 4 MG tablet Take 4 mg by mouth 2 (two) times daily as needed.   Vladimir Faster Glycol-Propyl Glycol (SYSTANE) 0.4-0.3 % GEL ophthalmic gel Place 1 application into both eyes 2 (two) times daily at 10 AM and 5 PM.  . sodium bicarbonate 650 MG tablet Take 650 mg by mouth 2 (two) times daily.   No facility-administered encounter medications on file as of 06/04/2020.     SIGNIFICANT DIAGNOSTIC EXAMS   PREVIOUS;   04-28-19: right hip and pelvic x-ray: There is no acute fracture or subluxation. Dense atherosclerotic calcification of the femoral arteries. Degenerative changes are seen in the LOWER lumbar spine. Coarse calcifications in the pelvis are consistent with calcified fibroids. Bowel sutures are noted in the LOWER pelvis  NO NEW EXAMS.    LABS REVIEWED PREVIOUS;   05-17-19: glucose 95; bun 55; creat 2.14; k+ 5.1; na++ 136; ca 9.1; liver normal albumin 4.0 tsh 2.740 05-25-19: wbc 5.7; hgb 10.8; hct 35.6; mcv 85.6 plt 318; glucose 102; bun 52; creat 1.74; k+ 4.6; na++ 139; ca 9.5; liver normal albumin 4.0   07-21-19: wbc 5.3; hgb 10.2; hct 32.6; mcv 84.5 plt 271; glucose 118; bun 50; creat 2.08; k+ 4.1; na++ 138; ca 8.8; liver normal albumin 3.5; tsh 0.943; urine culture: no growth   12-05-19: vit D 22.50 01-30-20: wbc 6.3; hgb 10.8; hct 35.1; mcv 86.0 plt 284; glucose 95; bun 52; creat 2.04; k+ 3.9; na++ 136; ca 9.3 liver normal albumin 4.0 tsh 2.055   NO NEW LABS.   Review of Systems    Unable to perform ROS: Dementia (unable to participate )    Physical Exam Constitutional:      General: She is not in acute distress.    Appearance: She is underweight. She is not diaphoretic.  Neck:     Thyroid: No thyromegaly.  Cardiovascular:     Rate and Rhythm: Normal rate and regular rhythm.     Pulses: Normal pulses.     Heart sounds: Normal heart sounds.  Pulmonary:     Effort: Pulmonary effort is normal. No respiratory distress.     Breath sounds: Normal breath sounds.  Abdominal:     General: Bowel sounds are normal. There is no distension.     Palpations: Abdomen is soft.     Tenderness: There is no abdominal tenderness.  Musculoskeletal:     Cervical back: Neck supple.     Right lower leg: No edema.     Left lower leg: No edema.     Comments: Is able to move all extremities  Uses wheelchair  History of left femur ORIF            Lymphadenopathy:     Cervical: No cervical adenopathy.  Skin:    General: Skin is warm and dry.  Neurological:     Mental Status: She is alert. Mental status is at baseline.  Psychiatric:        Mood and Affect: Mood normal.       ASSESSMENT/ PLAN:  TODAY  1. Failure to thrive in adult 2. Benign hypertension with stage IV CKD (chronic kidney disease) 3. Dementia without behavioral disturbance unspecified dementia type  Will continue current medications Will continue current plan of care Will continue to monitor her status.    MD is aware of resident's narcotic use and is in agreement with current plan of care. We will attempt to wean resident as appropriate.  Ok Edwards NP [facility] Adult Medicine  Contact [phone] Monday through Friday 8am- 5pm  After hours call [phone]

## 2020-06-29 ENCOUNTER — Encounter: Payer: Self-pay | Admitting: Adult Health

## 2020-06-29 ENCOUNTER — Non-Acute Institutional Stay (SKILLED_NURSING_FACILITY): Payer: Medicare HMO | Admitting: Adult Health

## 2020-06-29 DIAGNOSIS — I129 Hypertensive chronic kidney disease with stage 1 through stage 4 chronic kidney disease, or unspecified chronic kidney disease: Secondary | ICD-10-CM | POA: Diagnosis not present

## 2020-06-29 DIAGNOSIS — D509 Iron deficiency anemia, unspecified: Secondary | ICD-10-CM | POA: Diagnosis not present

## 2020-06-29 DIAGNOSIS — N184 Chronic kidney disease, stage 4 (severe): Secondary | ICD-10-CM | POA: Diagnosis not present

## 2020-06-29 NOTE — Progress Notes (Signed)
Location:    [place] Room Number: 113/D Place of Service:  SNF (31)   CODE STATUS: DNR  No Known Allergies  Chief Complaint  Patient presents with  . Medical Management of Chronic Issues           Unspecified iron deficiency anemia:   CKD (chronic kidney disease) stage IV   Benign hypertension with CKD (chronic kidney disease) stage IV     HPI:  She is a 86 year old long term resident of this facility being seen for the management of her chronic illnesses:Unspecified iron deficiency anemia:   CKD (chronic kidney disease) stage IV   Benign hypertension with CKD (chronic kidney disease) stage IV. There are no reports of uncontrolled pain; no agitation no anxiety; her weight is stable at this time.    Past Medical History:  Diagnosis Date  . Anemia   . Arthritis   . History of blood transfusion    "today is the first time" (08/05/2014)  . Hypertension   . Left femoral shaft fracture ([place]) 08/08/2014    Past Surgical History:  Procedure Laterality Date  . APPENDECTOMY    . CATARACT EXTRACTION Left   . CHOLECYSTECTOMY    . COLONOSCOPY N/A 09/26/2014   Procedure: COLONOSCOPY;  Surgeon: [name], MD;  [facility];  Service: Endoscopy;  Laterality: N/A;  1030am  . CYSTECTOMY     "had cyst taken off lower back"  . ESOPHAGOGASTRODUODENOSCOPY N/A 08/22/2014   FAO:ZHYQMVHQ ring at the gastro junction/small HH  . ORIF FEMUR FRACTURE Left 08/07/2014   Procedure: OPEN REDUCTION INTERNAL FIXATION (ORIF)  LEFT FEMUR FRACTURE;  Surgeon: [name], MD;  Location: [place];  Service: Orthopedics;  Laterality: Left;  . TONSILLECTOMY      Social History   Socioeconomic History  . Marital status: Widowed    Spouse name: Not on file  . Number of children: Not on file  . Years of education: Not on file  . Highest education level: Not on file  Occupational History  . Occupation: retired   Tobacco Use  . Smoking status: Former Smoker     Packs/day: 0.25    Years: 16.00    Pack years: 4.00    Types: Cigarettes  . Smokeless tobacco: Never Used  Vaping Use  . Vaping Use: Never used  Substance and Sexual Activity  . Alcohol use: No  . Drug use: No  . Sexual activity: Never  Other Topics Concern  . Not on file  Social History Narrative   Long term resident of [facility]    Social Determinants of Health   Financial Resource Strain:   . Difficulty of Paying Living Expenses: Not on file  Food Insecurity:   . Worried About Charity fundraiser in the Last Year: Not on file  . Ran Out of Food in the Last Year: Not on file  Transportation Needs:   . Lack of Transportation (Medical): Not on file  . Lack of Transportation (Non-Medical): Not on file  Physical Activity:   . Days of Exercise per Week: Not on file  . Minutes of Exercise per Session: Not on file  Stress:   . Feeling of Stress : Not on file  Social Connections: Unknown  . Frequency of Communication with Friends and Family: Not on file  . Frequency of Social Gatherings with Friends and Family: Never  . Attends Religious Services: Not on file  . Active Member of Clubs  or Organizations: Not on file  . Attends Archivist Meetings: Not on file  . Marital Status: Not on file  Intimate Partner Violence:   . Fear of Current or Ex-Partner: Not on file  . Emotionally Abused: Not on file  . Physically Abused: Not on file  . Sexually Abused: Not on file   Family History  Family history unknown: Yes      VITAL SIGNS BP (!) 169/52   Pulse (!) 59   Temp 98 F (36.7 C)   Resp 20   Ht 4\' 11"  (1.499 m)   Wt 96 lb 6.4 oz (43.7 kg)   BMI 19.47 kg/m   Outpatient Encounter Medications as of 06/29/2020  Medication Sig  . acetaminophen (TYLENOL) 325 MG tablet Take 650 mg by mouth every 6 (six) hours as needed.  Marland Kitchen amLODipine (NORVASC) 5 MG tablet Take 5 mg by mouth daily.   . Cholecalciferol 1.25 MG (50000 UT) capsule Take 50,000 Units by mouth once a week. On  Friday  . ferrous sulfate 325 (65 FE) MG tablet Take 325 mg by mouth daily with breakfast.   . metoprolol (LOPRESSOR) 50 MG tablet Take 1 tablet (50 mg total) by mouth 2 (two) times daily.  . NON FORMULARY Diet Change: Dysphagia 1 (puree), continue thin liquids  . NON FORMULARY Wanderguard for safety awareness. Check placement and function qshift. Alert tag number [id number]. Every Shift Day, Evening, Night  . ondansetron (ZOFRAN) 4 MG tablet Take 4 mg by mouth 2 (two) times daily as needed.   Vladimir Faster Glycol-Propyl Glycol (SYSTANE) 0.4-0.3 % GEL ophthalmic gel Place 1 application into both eyes 2 (two) times daily at 10 AM and 5 PM.  . sodium bicarbonate 650 MG tablet Take 650 mg by mouth 2 (two) times daily.   No facility-administered encounter medications on file as of 06/29/2020.     SIGNIFICANT DIAGNOSTIC EXAMS   PREVIOUS;   04-28-19: right hip and pelvic x-ray: There is no acute fracture or subluxation. Dense atherosclerotic calcification of the femoral arteries. Degenerative changes are seen in the LOWER lumbar spine. Coarse calcifications in the pelvis are consistent with calcified fibroids. Bowel sutures are noted in the LOWER pelvis  NO NEW EXAMS.    LABS REVIEWED PREVIOUS;    07-21-19: wbc 5.3; hgb 10.2; hct 32.6; mcv 84.5 plt 271; glucose 118; bun 50; creat 2.08; k+ 4.1; na++ 138; ca 8.8; liver normal albumin 3.5; tsh 0.943; urine culture: no growth   12-05-19: vit D 22.50 01-30-20: wbc 6.3; hgb 10.8; hct 35.1; mcv 86.0 plt 284; glucose 95; bun 52; creat 2.04; k+ 3.9; na++ 136; ca 9.3 liver normal albumin 4.0 tsh 2.055   NO NEW LABS.    Review of Systems  Unable to perform ROS: Dementia (unable to participate )    Physical Exam Constitutional:      General: She is not in acute distress.    Appearance: She is well-developed. She is not diaphoretic.  Neck:     Thyroid: No thyromegaly.  Cardiovascular:     Rate and Rhythm: Normal rate and regular rhythm.     Pulses:  Normal pulses.     Heart sounds: Normal heart sounds.  Pulmonary:     Effort: Pulmonary effort is normal. No respiratory distress.     Breath sounds: Normal breath sounds.  Abdominal:     General: Bowel sounds are normal. There is no distension.     Palpations: Abdomen is soft.  Tenderness: There is no abdominal tenderness.  Musculoskeletal:     Cervical back: Neck supple.     Right lower leg: No edema.     Left lower leg: No edema.     Comments: Is able to move all extremities  Uses wheelchair  History of left femur ORIF     Lymphadenopathy:     Cervical: No cervical adenopathy.  Skin:    General: Skin is warm and dry.  Neurological:     Mental Status: She is alert. Mental status is at baseline.  Psychiatric:        Mood and Affect: Mood normal.      ASSESSMENT/ PLAN:  TODAY:    1. Unspecified iron deficiency anemia: is stable hgb 10.8 will continue iron daily   2. CKD (chronic kidney disease) stage IV is stable bun 52; creat 2.04 will monitor   3. Benign hypertension with CKD (chronic kidney disease) stage IV is stable b/p 169/52 will continue lopressor 50 mg twice daily norvasc 5 mg daily   PREVIOUS   4. Vitamin D deficiency stable level 22.50 will continue vit D 50,000 units weekly  5. Colon cancer: without change will monitor   6. Unspecified type dementia without behavioral disturbance is without change weight is 96 pound will not make changes. She is slowly losing weight which is an unfortunate but expected outcome at the late stages of this disease process.   7. Chronic idiopathic constipation: is stable off medications will monitor  8. Left femoral shaft femur history of left femur ORIF is stable will monitor     MD is aware of resident's narcotic use and is in agreement with current plan of care. We will attempt to wean resident as appropriate.  Ok Edwards NP [facility]  Contact [phone] Monday through Friday 8am- 5pm  After  hours call [phone]

## 2020-07-17 DIAGNOSIS — M79675 Pain in left toe(s): Secondary | ICD-10-CM | POA: Diagnosis not present

## 2020-07-17 DIAGNOSIS — L603 Nail dystrophy: Secondary | ICD-10-CM | POA: Diagnosis not present

## 2020-07-17 DIAGNOSIS — M79674 Pain in right toe(s): Secondary | ICD-10-CM | POA: Diagnosis not present

## 2020-07-17 DIAGNOSIS — I739 Peripheral vascular disease, unspecified: Secondary | ICD-10-CM | POA: Diagnosis not present

## 2020-07-17 DIAGNOSIS — B351 Tinea unguium: Secondary | ICD-10-CM | POA: Diagnosis not present

## 2020-07-28 ENCOUNTER — Encounter: Payer: Self-pay | Admitting: Adult Health

## 2020-07-28 ENCOUNTER — Non-Acute Institutional Stay (SKILLED_NURSING_FACILITY): Payer: Medicare HMO | Admitting: Adult Health

## 2020-07-28 DIAGNOSIS — E559 Vitamin D deficiency, unspecified: Secondary | ICD-10-CM

## 2020-07-28 DIAGNOSIS — Z66 Do not resuscitate: Secondary | ICD-10-CM | POA: Diagnosis not present

## 2020-07-28 DIAGNOSIS — F0390 Unspecified dementia without behavioral disturbance: Secondary | ICD-10-CM

## 2020-07-28 DIAGNOSIS — C189 Malignant neoplasm of colon, unspecified: Secondary | ICD-10-CM

## 2020-07-28 NOTE — Progress Notes (Signed)
Location:    [place] Room Number: 113-D Place of Service:  SNF (31)   CODE STATUS: DNR  No Known Allergies  Chief Complaint  Patient presents with   Medical Management of Chronic Issues          Vitamin D deficiency:   Colon cancer  Dementia without behavioral disturbance unspecified dementia type    HPI:  She is a 86 year old long term resident of this facility being seen for the management of her chronic illnesses: Vitamin D deficiency:   Colon cancer  Dementia without behavioral disturbance unspecified dementia type. There are no reports of uncontrolled pain; no reports of agitation; no reports of insomnia. She continues to slowly lose weight.   Past Medical History:  Diagnosis Date   Anemia    Arthritis    History of blood transfusion    "today is the first time" (08/05/2014)   Hypertension    Left femoral shaft fracture ([place]) 08/08/2014    Past Surgical History:  Procedure Laterality Date   APPENDECTOMY     CATARACT EXTRACTION Left    CHOLECYSTECTOMY     COLONOSCOPY N/A 09/26/2014   Procedure: COLONOSCOPY;  Surgeon: [name], MD;  [facility];  Service: Endoscopy;  Laterality: N/A;  1030am   CYSTECTOMY     "had cyst taken off lower back"   ESOPHAGOGASTRODUODENOSCOPY N/A 08/22/2014   DEY:CXKGYJEH ring at the gastro junction/small HH   ORIF FEMUR FRACTURE Left 08/07/2014   Procedure: OPEN REDUCTION INTERNAL FIXATION (ORIF)  LEFT FEMUR FRACTURE;  Surgeon: [name], MD;  Location: [place];  Service: Orthopedics;  Laterality: Left;   TONSILLECTOMY      Social History   Socioeconomic History   Marital status: Widowed    Spouse name: Not on file   Number of children: Not on file   Years of education: Not on file   Highest education level: Not on file  Occupational History   Occupation: retired   Tobacco Use   Smoking status: Former Smoker    Packs/day: 0.25    Years: 16.00    Pack years: 4.00     Types: Cigarettes   Smokeless tobacco: Never Used  Scientific laboratory technician Use: Never used  Substance and Sexual Activity   Alcohol use: No   Drug use: No   Sexual activity: Never  Other Topics Concern   Not on file  Social History Narrative   Long term resident of [facility]    Social Determinants of Health   Financial Resource Strain:    Difficulty of Paying Living Expenses: Not on file  Food Insecurity:    Worried About Charity fundraiser in the Last Year: Not on file   [employer] of Food in the Last Year: Not on file  Transportation Needs:    Lack of Transportation (Medical): Not on file   Lack of Transportation (Non-Medical): Not on file  Physical Activity:    Days of Exercise per Week: Not on file   Minutes of Exercise per Session: Not on file  Stress:    Feeling of Stress : Not on file  Social Connections: Unknown   Frequency of Communication with Friends and Family: Not on file   Frequency of Social Gatherings with Friends and Family: Never   Attends Religious Services: Not on file   Active Member of Clubs or Organizations: Not on file   Attends Archivist Meetings: Not on file  Marital Status: Not on file  Intimate Partner Violence:    Fear of Current or Ex-Partner: Not on file   Emotionally Abused: Not on file   Physically Abused: Not on file   Sexually Abused: Not on file   Family History  Family history unknown: Yes      VITAL SIGNS BP (!) 145/60    Pulse 63    Temp 97.6 F (36.4 C)    Resp 20    Ht 4\' 11"  (1.499 m)    Wt 93 lb 9.6 oz (42.5 kg)    SpO2 95%    BMI 18.90 kg/m   Outpatient Encounter Medications as of 07/28/2020  Medication Sig   acetaminophen (TYLENOL) 325 MG tablet Take 650 mg by mouth every 6 (six) hours as needed.   amLODipine (NORVASC) 5 MG tablet Take 5 mg by mouth daily.    Cholecalciferol 1.25 MG (50000 UT) capsule Take 50,000 Units by mouth once a week. On Friday   ferrous sulfate 325 (65 FE) MG  tablet Take 325 mg by mouth daily with breakfast.    metoprolol (LOPRESSOR) 50 MG tablet Take 1 tablet (50 mg total) by mouth 2 (two) times daily.   NON FORMULARY Diet Change: Dysphagia 1 (puree), continue thin liquids   NON FORMULARY Wanderguard for safety awareness. Check placement and function qshift. Alert tag number [id number]. Every Shift Day, Evening, Night   ondansetron (ZOFRAN) 4 MG tablet Take 4 mg by mouth 2 (two) times daily as needed.    Polyethyl Glycol-Propyl Glycol (SYSTANE) 0.4-0.3 % GEL ophthalmic gel Place 1 application into both eyes 2 (two) times daily at 10 AM and 5 PM.   sodium bicarbonate 650 MG tablet Take 650 mg by mouth 2 (two) times daily.   No facility-administered encounter medications on file as of 07/28/2020.     SIGNIFICANT DIAGNOSTIC EXAMS   PREVIOUS;   04-28-19: right hip and pelvic x-ray: There is no acute fracture or subluxation. Dense atherosclerotic calcification of the femoral arteries. Degenerative changes are seen in the LOWER lumbar spine. Coarse calcifications in the pelvis are consistent with calcified fibroids. Bowel sutures are noted in the LOWER pelvis  NO NEW EXAMS.    LABS REVIEWED PREVIOUS;    07-21-19: wbc 5.3; hgb 10.2; hct 32.6; mcv 84.5 plt 271; glucose 118; bun 50; creat 2.08; k+ 4.1; na++ 138; ca 8.8; liver normal albumin 3.5; tsh 0.943; urine culture: no growth   12-05-19: vit D 22.50 01-30-20: wbc 6.3; hgb 10.8; hct 35.1; mcv 86.0 plt 284; glucose 95; bun 52; creat 2.04; k+ 3.9; na++ 136; ca 9.3 liver normal albumin 4.0 tsh 2.055   NO NEW LABS.    Review of Systems  Unable to perform ROS: Dementia (unable to participate )    Physical Exam Constitutional:      General: She is not in acute distress.    Appearance: She is well-developed. She is not diaphoretic.  Neck:     Thyroid: No thyromegaly.  Cardiovascular:     Rate and Rhythm: Normal rate and regular rhythm.     Pulses: Normal pulses.     Heart sounds: Normal  heart sounds.  Pulmonary:     Effort: Pulmonary effort is normal. No respiratory distress.     Breath sounds: Normal breath sounds.  Abdominal:     General: Bowel sounds are normal. There is no distension.     Palpations: Abdomen is soft.     Tenderness: There is no abdominal tenderness.  Musculoskeletal:     Cervical back: Neck supple.     Right lower leg: No edema.     Left lower leg: No edema.     Comments:  Is able to move all extremities  Uses wheelchair  History of left femur ORIF    Lymphadenopathy:     Cervical: No cervical adenopathy.  Skin:    General: Skin is warm and dry.  Neurological:     Mental Status: She is alert. Mental status is at baseline.  Psychiatric:        Mood and Affect: Mood normal.       ASSESSMENT/ PLAN:  TODAY:   1. Vitamin D deficiency: is stable level 22.50 will continue vit D 50,000 units weekly   2. Colon cancer is without change will monitor  3. Dementia without behavioral disturbance unspecified dementia type: is without change weight is 93 pounds; she is slowly losing weight which is an unfortunate expected outcome in the late stages of this disease process.   PREVIOUS   4. Chronic idiopathic constipation: is stable off medications will monitor  5. Left femoral shaft femur history of left femur ORIF is stable will monitor   6. Unspecified iron deficiency anemia: is stable hgb 10.8 will continue iron daily   7. CKD (chronic kidney disease) stage IV is stable bun 52; creat 2.04 will monitor   8. Benign hypertension with CKD (chronic kidney disease) stage IV is stable b/p 145/60 will continue lopressor 50 mg twice daily norvasc 5 mg daily      MD is aware of resident's narcotic use and is in agreement with current plan of care. We will attempt to wean resident as appropriate.  Ok Edwards NP [facility]  Contact [phone] Monday through Friday 8am- 5pm  After hours call [phone]

## 2020-07-29 ENCOUNTER — Other Ambulatory Visit (HOSPITAL_COMMUNITY)
Admission: RE | Admit: 2020-07-29 | Discharge: 2020-07-29 | Disposition: A | Discharge status: Home / Self Care | Payer: Medicare HMO | Source: Skilled Nursing Facility | Attending: Adult Health | Admitting: Adult Health

## 2020-07-29 DIAGNOSIS — I129 Hypertensive chronic kidney disease with stage 1 through stage 4 chronic kidney disease, or unspecified chronic kidney disease: Secondary | ICD-10-CM | POA: Diagnosis not present

## 2020-07-29 LAB — COMPREHENSIVE METABOLIC PANEL
ALT: 13 U/L (ref 0–44)
AST: 15 U/L (ref 15–41)
Albumin: 3.9 g/dL (ref 3.5–5.0)
Alkaline Phosphatase: 45 U/L (ref 38–126)
Anion gap: 8 (ref 5–15)
BUN: 48 mg/dL — ABNORMAL HIGH (ref 8–23)
CO2: 28 mmol/L (ref 22–32)
Calcium: 9.7 mg/dL (ref 8.9–10.3)
Chloride: 103 mmol/L (ref 98–111)
Creatinine, Ser: 2.11 mg/dL — ABNORMAL HIGH (ref 0.44–1.00)
GFR, Estimated: 21 mL/min — ABNORMAL LOW (ref >60)
Glucose, Bld: 103 mg/dL — ABNORMAL HIGH (ref 70–99)
Potassium: 4.2 mmol/L (ref 3.5–5.1)
Sodium: 139 mmol/L (ref 135–145)
Total Bilirubin: 0.3 mg/dL (ref 0.3–1.2)
Total Protein: 6.9 g/dL (ref 6.5–8.1)

## 2020-07-29 LAB — CBC
HCT: 36.4 % (ref 36.0–46.0)
Hemoglobin: 11.2 g/dL — ABNORMAL LOW (ref 12.0–15.0)
MCH: 26.9 pg (ref 26.0–34.0)
MCHC: 30.8 g/dL (ref 30.0–36.0)
MCV: 87.3 fL (ref 80.0–100.0)
Platelets: 269 10*3/uL (ref 150–400)
RBC: 4.17 MIL/uL (ref 3.87–5.11)
RDW: 13.6 % (ref 11.5–15.5)
WBC: 5.3 10*3/uL (ref 4.0–10.5)
nRBC: 0 % (ref 0.0–0.2)

## 2020-07-29 LAB — VITAMIN D 25 HYDROXY (VIT D DEFICIENCY, FRACTURES): Vit D, 25-Hydroxy: 124.76 ng/mL — ABNORMAL HIGH (ref 30–100)

## 2020-07-29 LAB — TSH: TSH: 1.674 u[IU]/mL (ref 0.350–4.500)

## 2020-08-04 DIAGNOSIS — I129 Hypertensive chronic kidney disease with stage 1 through stage 4 chronic kidney disease, or unspecified chronic kidney disease: Secondary | ICD-10-CM | POA: Diagnosis not present

## 2020-08-04 DIAGNOSIS — Z1159 Encounter for screening for other viral diseases: Secondary | ICD-10-CM | POA: Diagnosis not present

## 2020-08-26 ENCOUNTER — Encounter: Payer: Self-pay | Admitting: Adult Health

## 2020-08-26 ENCOUNTER — Non-Acute Institutional Stay (SKILLED_NURSING_FACILITY): Payer: Medicare HMO | Admitting: Adult Health

## 2020-08-26 DIAGNOSIS — S72355D Nondisplaced comminuted fracture of shaft of left femur, subsequent encounter for closed fracture with routine healing: Secondary | ICD-10-CM | POA: Diagnosis not present

## 2020-08-26 DIAGNOSIS — K5904 Chronic idiopathic constipation: Secondary | ICD-10-CM | POA: Diagnosis not present

## 2020-08-26 DIAGNOSIS — D509 Iron deficiency anemia, unspecified: Secondary | ICD-10-CM | POA: Diagnosis not present

## 2020-08-26 NOTE — Progress Notes (Signed)
Location:  [place] Room Number: 113-D Place of Service:  SNF (31)   CODE STATUS: DNR  No Known Allergies  Chief Complaint  Patient presents with  . Medical Management of Chronic Issues           Chronic idiopathic constipation:  Left femoral shaft fracture  Unspecified iron deficiency anemia    HPI:  She is a 86 year old long term resident of this facility being seen for the management of her chronic illnesses; Chronic idiopathic constipation:  Left femoral shaft fracture  Unspecified iron deficiency anemia. There are no reports of uncontrolled pain; she continues to slowly lose weight. There are no reports of constipation.    Past Medical History:  Diagnosis Date  . Anemia   . Arthritis   . History of blood transfusion    "today is the first time" (08/05/2014)  . Hypertension   . Left femoral shaft fracture ([place]) 08/08/2014    Past Surgical History:  Procedure Laterality Date  . APPENDECTOMY    . CATARACT EXTRACTION Left   . CHOLECYSTECTOMY    . COLONOSCOPY N/A 09/26/2014   Procedure: COLONOSCOPY;  Surgeon: [name], MD;  [facility];  Service: Endoscopy;  Laterality: N/A;  1030am  . CYSTECTOMY     "had cyst taken off lower back"  . ESOPHAGOGASTRODUODENOSCOPY N/A 08/22/2014   BTD:[id number] ring at the gastro junction/small HH  . ORIF FEMUR FRACTURE Left 08/07/2014   Procedure: OPEN REDUCTION INTERNAL FIXATION (ORIF)  LEFT FEMUR FRACTURE;  Surgeon: [name], MD;  Location: [place];  Service: Orthopedics;  Laterality: Left;  . TONSILLECTOMY      Social History   Socioeconomic History  . Marital status: Widowed    Spouse name: Not on file  . Number of children: Not on file  . Years of education: Not on file  . Highest education level: Not on file  Occupational History  . Occupation: retired   Tobacco Use  . Smoking status: Former Smoker    Packs/day: 0.25    Years: 16.00    Pack years: 4.00    Types: Cigarettes   . Smokeless tobacco: Never Used  Vaping Use  . Vaping Use: Never used  Substance and Sexual Activity  . Alcohol use: No  . Drug use: No  . Sexual activity: Never  Other Topics Concern  . Not on file  Social History Narrative   Long term resident of [facility]    Social Determinants of Health   Financial Resource Strain: Not on file  Food Insecurity: Not on file  Transportation Needs: Not on file  Physical Activity: Not on file  Stress: Not on file  Social Connections: Not on file  Intimate Partner Violence: Not on file   Family History  Family history unknown: Yes      VITAL SIGNS BP (!) 149/66   Pulse (!) 59   Temp (!) 97.3 F (36.3 C)   Resp 16   Ht 4\' 11"  (1.499 m)   Wt 92 lb 6.4 oz (41.9 kg)   SpO2 95%   BMI 18.66 kg/m   Outpatient Encounter Medications as of 08/26/2020  Medication Sig  . acetaminophen (TYLENOL) 325 MG tablet Take 650 mg by mouth every 6 (six) hours as needed.  Marland Kitchen amLODipine (NORVASC) 5 MG tablet Take 5 mg by mouth daily.   . ferrous sulfate 325 (65 FE) MG tablet Take 325 mg by mouth daily with breakfast.   .  metoprolol (LOPRESSOR) 50 MG tablet Take 1 tablet (50 mg total) by mouth 2 (two) times daily.  . NON FORMULARY Diet Change: Dysphagia 1 (puree), continue thin liquids  . NON FORMULARY Wanderguard for safety awareness. Check placement and function qshift. Alert tag number [id number]. Every Shift Day, Evening, Night  . ondansetron (ZOFRAN) 4 MG tablet Take 4 mg by mouth 2 (two) times daily as needed.   Vladimir Faster Glycol-Propyl Glycol (SYSTANE) 0.4-0.3 % GEL ophthalmic gel Place 1 application into both eyes 2 (two) times daily at 10 AM and 5 PM.  . sodium bicarbonate 650 MG tablet Take 650 mg by mouth 2 (two) times daily.  . [DISCONTINUED] Cholecalciferol 1.25 MG (50000 UT) capsule Take 50,000 Units by mouth once a week. On Friday   No facility-administered encounter medications on file as of 08/26/2020.     SIGNIFICANT DIAGNOSTIC  EXAMS   PREVIOUS;   04-28-19: right hip and pelvic x-ray: There is no acute fracture or subluxation. Dense atherosclerotic calcification of the femoral arteries. Degenerative changes are seen in the LOWER lumbar spine. Coarse calcifications in the pelvis are consistent with calcified fibroids. Bowel sutures are noted in the LOWER pelvis  NO NEW EXAMS.    LABS REVIEWED PREVIOUS;   12-05-19: vit D 22.50 01-30-20: wbc 6.3; hgb 10.8; hct 35.1; mcv 86.0 plt 284; glucose 95; bun 52; creat 2.04; k+ 3.9; na++ 136; ca 9.3 liver normal albumin 4.0 tsh 2.055   TODAY  07-29-20: wbc 5.3; hgb 11.2; hct 36.4; mcv 87.3 plt 269; glucose 103; bun 48; creat 2.11; k+ 4.2; na++ 139; ca 9.7 liver normal albumin 3.9 tsh 1.674 vit D 124.76    Review of Systems  Unable to perform ROS: Dementia (unable to participate )    Physical Exam Constitutional:      General: She is not in acute distress.    Appearance: She is underweight and well-nourished. She is not diaphoretic.  Neck:     Thyroid: No thyromegaly.  Cardiovascular:     Rate and Rhythm: Normal rate and regular rhythm.     Pulses: Normal pulses and intact distal pulses.     Heart sounds: Normal heart sounds.  Pulmonary:     Effort: Pulmonary effort is normal. No respiratory distress.     Breath sounds: Normal breath sounds.  Abdominal:     General: Bowel sounds are normal. There is no distension.     Palpations: Abdomen is soft.     Tenderness: There is no abdominal tenderness.  Musculoskeletal:        General: No edema.     Cervical back: Neck supple.     Right lower leg: No edema.     Left lower leg: No edema.     Comments: Is able to move all extremities  Uses wheelchair  History of left femur ORIF    Lymphadenopathy:     Cervical: No cervical adenopathy.  Skin:    General: Skin is warm and dry.  Neurological:     Mental Status: She is alert. Mental status is at baseline.  Psychiatric:        Mood and Affect: Mood and affect and  mood normal.     ASSESSMENT/ PLAN:  TODAY:   1. Chronic idiopathic constipation: is stable off medications will monitor   2. Left femoral shaft fracture history of left femur ORIF is stable will monitor   3. Unspecified iron deficiency anemia: is stable hgb 11.2 will continue iron daily   PREVIOUS  4. CKD (chronic kidney disease) stage IV is stable bun 48; creat 2.11 will monitor   5. Benign hypertension with CKD (chronic kidney disease) stage IV is stable b/p 149/66 will continue lopressor 50 mg twice daily norvasc 5 mg daily   6. Vitamin D deficiency: is stable level 124.76 is off supplement    7. Colon cancer is without change will monitor  8. Dementia without behavioral disturbance unspecified dementia type: is without change weight is 92 pounds; she is slowly losing weight which is an unfortunate expected outcome in the late stages of this disease process.     MD is aware of resident's narcotic use and is in agreement with current plan of care. We will attempt to wean resident as appropriate.  Ok Edwards NP [facility]  Contact [phone] Monday through Friday 8am- 5pm  After hours call [phone]

## 2020-09-02 DIAGNOSIS — I129 Hypertensive chronic kidney disease with stage 1 through stage 4 chronic kidney disease, or unspecified chronic kidney disease: Secondary | ICD-10-CM | POA: Diagnosis not present

## 2020-09-02 DIAGNOSIS — Z1159 Encounter for screening for other viral diseases: Secondary | ICD-10-CM | POA: Diagnosis not present

## 2020-09-03 ENCOUNTER — Non-Acute Institutional Stay (SKILLED_NURSING_FACILITY): Payer: Medicare HMO | Admitting: Adult Health

## 2020-09-03 ENCOUNTER — Encounter: Payer: Self-pay | Admitting: Adult Health

## 2020-09-03 DIAGNOSIS — N184 Chronic kidney disease, stage 4 (severe): Secondary | ICD-10-CM

## 2020-09-03 DIAGNOSIS — I700 Atherosclerosis of aorta: Secondary | ICD-10-CM

## 2020-09-03 DIAGNOSIS — I129 Hypertensive chronic kidney disease with stage 1 through stage 4 chronic kidney disease, or unspecified chronic kidney disease: Secondary | ICD-10-CM | POA: Diagnosis not present

## 2020-09-03 NOTE — Progress Notes (Signed)
Location:  [facility] Room Number: 113/D Place of Service:  SNF (31)   CODE STATUS: DNR  No Known Allergies  Chief Complaint  Patient presents with  . Acute Visit    Care Plan Meeting     HPI:  We have come together for her care plan meeting. No BIMS; mood 0/30. Had a fall on 07-02-20 without injury. She requires limited to extensive assist with adls; she feeds self. Is frequently incontinent of bladder and bowel. She continues to have a cyst on her lower back. Her weight is 92.4 pounds has lost 10 pounds in the past 6 months; her appetite remains poor. There are no indications of pain present. She continues to be followed for her chronic illnesses including:  Aortic atherosclerosis  Chronic kidney disease (CKD) IV severe  Benign hypertension with CKD stage IV  Past Medical History:  Diagnosis Date  . Anemia   . Arthritis   . History of blood transfusion    "today is the first time" (08/05/2014)  . Hypertension   . Left femoral shaft fracture ([place]) 08/08/2014    Past Surgical History:  Procedure Laterality Date  . APPENDECTOMY    . CATARACT EXTRACTION Left   . CHOLECYSTECTOMY    . COLONOSCOPY N/A 09/26/2014   Procedure: COLONOSCOPY;  Surgeon: [name], MD;  [facility];  Service: Endoscopy;  Laterality: N/A;  1030am  . CYSTECTOMY     "had cyst taken off lower back"  . ESOPHAGOGASTRODUODENOSCOPY N/A 08/22/2014   YQM:VHQIONGE ring at the gastro junction/small HH  . ORIF FEMUR FRACTURE Left 08/07/2014   Procedure: OPEN REDUCTION INTERNAL FIXATION (ORIF)  LEFT FEMUR FRACTURE;  Surgeon: [name], MD;  Location: [facility];  Service: Orthopedics;  Laterality: Left;  . TONSILLECTOMY      Social History   Socioeconomic History  . Marital status: Widowed    Spouse name: Not on file  . Number of children: Not on file  . Years of education: Not on file  . Highest education level: Not on file  Occupational History  . Occupation:  retired   Tobacco Use  . Smoking status: Former Smoker    Packs/day: 0.25    Years: 16.00    Pack years: 4.00    Types: Cigarettes  . Smokeless tobacco: Never Used  Vaping Use  . Vaping Use: Never used  Substance and Sexual Activity  . Alcohol use: No  . Drug use: No  . Sexual activity: Never  Other Topics Concern  . Not on file  Social History Narrative   Long term resident of [facility]    Social Determinants of Health   Financial Resource Strain: Not on file  Food Insecurity: Not on file  Transportation Needs: Not on file  Physical Activity: Not on file  Stress: Not on file  Social Connections: Not on file  Intimate Partner Violence: Not on file   Family History  Family history unknown: Yes      VITAL SIGNS BP (!) 142/82   Pulse 60   Temp (!) 97.1 F (36.2 C)   Resp 16   Ht 4\' 11"  (1.499 m)   Wt 92 lb 6.4 oz (41.9 kg)   SpO2 95%   BMI 18.66 kg/m   Outpatient Encounter Medications as of 09/03/2020  Medication Sig  . acetaminophen (TYLENOL) 325 MG tablet Take 650 mg by mouth every 6 (six) hours as needed.  Marland Kitchen amLODipine (NORVASC) 5 MG tablet Take 5  mg by mouth daily.   . ferrous sulfate 325 (65 FE) MG tablet Take 325 mg by mouth daily with breakfast.   . metoprolol (LOPRESSOR) 50 MG tablet Take 1 tablet (50 mg total) by mouth 2 (two) times daily.  . NON FORMULARY Diet Change: Dysphagia 1 (puree), continue thin liquids  . NON FORMULARY Wanderguard for safety awareness. Check placement and function qshift. Alert tag number [id number]. Every Shift Day, Evening, Night  . Polyethyl Glycol-Propyl Glycol (SYSTANE) 0.4-0.3 % GEL ophthalmic gel Place 1 application into both eyes 2 (two) times daily at 10 AM and 5 PM.  . sodium bicarbonate 650 MG tablet Take 650 mg by mouth 2 (two) times daily.  . [DISCONTINUED] ondansetron (ZOFRAN) 4 MG tablet Take 4 mg by mouth 2 (two) times daily as needed.    No facility-administered encounter medications on file as of 09/03/2020.      SIGNIFICANT DIAGNOSTIC EXAMS  PREVIOUS;   04-28-19: right hip and pelvic x-ray: There is no acute fracture or subluxation. Dense atherosclerotic calcification of the femoral arteries. Degenerative changes are seen in the LOWER lumbar spine. Coarse calcifications in the pelvis are consistent with calcified fibroids. Bowel sutures are noted in the LOWER pelvis  NO NEW EXAMS.    LABS REVIEWED PREVIOUS;   12-05-19: vit D 22.50 01-30-20: wbc 6.3; hgb 10.8; hct 35.1; mcv 86.0 plt 284; glucose 95; bun 52; creat 2.04; k+ 3.9; na++ 136; ca 9.3 liver normal albumin 4.0 tsh 2.055   TODAY  07-29-20: wbc 5.3; hgb 11.2; hct 36.4; mcv 87.3 plt 269; glucose 103; bun 48; creat 2.11; k+ 4.2; na++ 139; ca 9.7 liver normal albumin 3.9 tsh 1.674 vit D 124.76    Review of Systems  Unable to perform ROS: Dementia (unable to participate )     Physical Exam Constitutional:      General: She is not in acute distress.    Appearance: She is well-developed and well-nourished. She is not diaphoretic.  Neck:     Thyroid: No thyromegaly.  Cardiovascular:     Rate and Rhythm: Normal rate and regular rhythm.     Pulses: Normal pulses and intact distal pulses.     Heart sounds: Normal heart sounds.  Pulmonary:     Effort: Pulmonary effort is normal. No respiratory distress.     Breath sounds: Normal breath sounds.  Abdominal:     General: Bowel sounds are normal. There is no distension.     Palpations: Abdomen is soft.     Tenderness: There is no abdominal tenderness.  Musculoskeletal:        General: No edema. Normal range of motion.     Cervical back: Neck supple.     Right lower leg: No edema.     Left lower leg: No edema.     Comments: Is able to move all extremities  Uses wheelchair  History of left femur ORIF     Lymphadenopathy:     Cervical: No cervical adenopathy.  Skin:    General: Skin is warm and dry.  Neurological:     Mental Status: She is alert. Mental status is at baseline.   Psychiatric:        Mood and Affect: Mood and affect and mood normal.    ASSESSMENT/ PLAN:  TODAY  1. Aortic atherosclerosis (CT 05-25-19) 2. Chronic kidney disease (CKD) IV severe 3. Benign hypertension with CKD stage IV  Will continue current medications Will continue current plan of care Will continue to monitor  her status.   MD is aware of resident's narcotic use and is in agreement with current plan of care. We will attempt to wean resident as appropriate.  Ok Edwards NP [facility]  Contact [phone] Monday through Friday 8am- 5pm  After hours call [phone]

## 2020-09-07 DIAGNOSIS — Z1159 Encounter for screening for other viral diseases: Secondary | ICD-10-CM | POA: Diagnosis not present

## 2020-09-07 DIAGNOSIS — I129 Hypertensive chronic kidney disease with stage 1 through stage 4 chronic kidney disease, or unspecified chronic kidney disease: Secondary | ICD-10-CM | POA: Diagnosis not present

## 2020-09-08 DIAGNOSIS — I700 Atherosclerosis of aorta: Secondary | ICD-10-CM | POA: Insufficient documentation

## 2020-09-16 DIAGNOSIS — Z1159 Encounter for screening for other viral diseases: Secondary | ICD-10-CM | POA: Diagnosis not present

## 2020-09-16 DIAGNOSIS — I129 Hypertensive chronic kidney disease with stage 1 through stage 4 chronic kidney disease, or unspecified chronic kidney disease: Secondary | ICD-10-CM | POA: Diagnosis not present

## 2020-09-23 DIAGNOSIS — Z20822 Contact with and (suspected) exposure to covid-19: Secondary | ICD-10-CM | POA: Diagnosis not present

## 2020-09-25 DIAGNOSIS — Z1159 Encounter for screening for other viral diseases: Secondary | ICD-10-CM | POA: Diagnosis not present

## 2020-09-25 DIAGNOSIS — I129 Hypertensive chronic kidney disease with stage 1 through stage 4 chronic kidney disease, or unspecified chronic kidney disease: Secondary | ICD-10-CM | POA: Diagnosis not present

## 2020-09-28 DIAGNOSIS — I129 Hypertensive chronic kidney disease with stage 1 through stage 4 chronic kidney disease, or unspecified chronic kidney disease: Secondary | ICD-10-CM | POA: Diagnosis not present

## 2020-09-28 DIAGNOSIS — Z1159 Encounter for screening for other viral diseases: Secondary | ICD-10-CM | POA: Diagnosis not present

## 2020-09-29 ENCOUNTER — Non-Acute Institutional Stay (SKILLED_NURSING_FACILITY): Payer: Medicare HMO | Admitting: Adult Health

## 2020-09-29 ENCOUNTER — Encounter: Payer: Self-pay | Admitting: Adult Health

## 2020-09-29 DIAGNOSIS — I700 Atherosclerosis of aorta: Secondary | ICD-10-CM

## 2020-09-29 DIAGNOSIS — N184 Chronic kidney disease, stage 4 (severe): Secondary | ICD-10-CM

## 2020-09-29 DIAGNOSIS — I129 Hypertensive chronic kidney disease with stage 1 through stage 4 chronic kidney disease, or unspecified chronic kidney disease: Secondary | ICD-10-CM | POA: Diagnosis not present

## 2020-09-29 NOTE — Progress Notes (Signed)
Location:  [facility] Room Number: 113-D Place of Service:  SNF (31)   CODE STATUS: DNR  No Known Allergies  Chief Complaint  Patient presents with  . Medical Management of Chronic Issues          Aortic atherosclerosis:. CKD (chronic kidney disease) stage IV:  Benign hypertension with CKD (chronic kidney disease) stage IV     HPI:  She is a 87 year old long term resident of this facility being seen for the management of her chronic illnesses: Aortic atherosclerosis:. CKD (chronic kidney disease) stage IV:  Benign hypertension with CKD (chronic kidney disease) stage IV . There are no reports of uncontrolled pain; no reports of agitation or anxiety. Her appetite remains poor.   Past Medical History:  Diagnosis Date  . Anemia   . Arthritis   . History of blood transfusion    "today is the first time" (08/05/2014)  . Hypertension   . Left femoral shaft fracture ([place]) 08/08/2014    Past Surgical History:  Procedure Laterality Date  . APPENDECTOMY    . CATARACT EXTRACTION Left   . CHOLECYSTECTOMY    . COLONOSCOPY N/A 09/26/2014   Procedure: COLONOSCOPY;  Surgeon: [name], MD;  [facility];  Service: Endoscopy;  Laterality: N/A;  1030am  . CYSTECTOMY     "had cyst taken off lower back"  . ESOPHAGOGASTRODUODENOSCOPY N/A 08/22/2014   GGY:IRSWNIOE ring at the gastro junction/small HH  . ORIF FEMUR FRACTURE Left 08/07/2014   Procedure: OPEN REDUCTION INTERNAL FIXATION (ORIF)  LEFT FEMUR FRACTURE;  Surgeon: [name], MD;  Location: [facility];  Service: Orthopedics;  Laterality: Left;  . TONSILLECTOMY      Social History   Socioeconomic History  . Marital status: Widowed    Spouse name: Not on file  . Number of children: Not on file  . Years of education: Not on file  . Highest education level: Not on file  Occupational History  . Occupation: retired   Tobacco Use  . Smoking status: Former Smoker    Packs/day: 0.25    Years:  16.00    Pack years: 4.00    Types: Cigarettes  . Smokeless tobacco: Never Used  Vaping Use  . Vaping Use: Never used  Substance and Sexual Activity  . Alcohol use: No  . Drug use: No  . Sexual activity: Never  Other Topics Concern  . Not on file  Social History Narrative   Long term resident of [facility]    Social Determinants of Health   Financial Resource Strain: Not on file  Food Insecurity: Not on file  Transportation Needs: Not on file  Physical Activity: Not on file  Stress: Not on file  Social Connections: Not on file  Intimate Partner Violence: Not on file   Family History  Family history unknown: Yes      VITAL SIGNS BP 114/83   Pulse 60   Temp (!) 97.3 F (36.3 C)   Resp 20   Ht 4\' 11"  (1.499 m)   Wt 90 lb 6.4 oz (41 kg)   SpO2 95%   BMI 18.26 kg/m   Outpatient Encounter Medications as of 09/29/2020  Medication Sig  . acetaminophen (TYLENOL) 325 MG tablet Take 650 mg by mouth every 6 (six) hours as needed.  Marland Kitchen amLODipine (NORVASC) 5 MG tablet Take 5 mg by mouth daily.   . ferrous sulfate 325 (65 FE) MG tablet Take 325 mg by mouth  daily with breakfast.   . metoprolol (LOPRESSOR) 50 MG tablet Take 1 tablet (50 mg total) by mouth 2 (two) times daily.  . NON FORMULARY Diet Change: Dysphagia 1 (puree), continue thin liquids  . NON FORMULARY Wanderguard for safety awareness. Check placement and function qshift. Alert tag number [id number]. Every Shift Day, Evening, Night  . Polyethyl Glycol-Propyl Glycol (SYSTANE) 0.4-0.3 % GEL ophthalmic gel Place 1 application into both eyes 2 (two) times daily at 10 AM and 5 PM.  . sodium bicarbonate 650 MG tablet Take 650 mg by mouth 2 (two) times daily.   No facility-administered encounter medications on file as of 09/29/2020.     SIGNIFICANT DIAGNOSTIC EXAMS    PREVIOUS;   04-28-19: right hip and pelvic x-ray: There is no acute fracture or subluxation. Dense atherosclerotic calcification of the femoral arteries.  Degenerative changes are seen in the LOWER lumbar spine. Coarse calcifications in the pelvis are consistent with calcified fibroids. Bowel sutures are noted in the LOWER pelvis  NO NEW EXAMS.    LABS REVIEWED PREVIOUS;   12-05-19: vit D 22.50 01-30-20: wbc 6.3; hgb 10.8; hct 35.1; mcv 86.0 plt 284; glucose 95; bun 52; creat 2.04; k+ 3.9; na++ 136; ca 9.3 liver normal albumin 4.0 tsh 2.055  07-29-20: wbc 5.3; hgb 11.2; hct 36.4; mcv 87.3 plt 269; glucose 103; bun 48; creat 2.11; k+ 4.2; na++ 139; ca 9.7 liver normal albumin 3.9 tsh 1.674 vit D 124.76   NO NEW LABS.    Review of Systems  Unable to perform ROS: Dementia (is unable to participate )    Physical Exam Constitutional:      General: She is not in acute distress.    Appearance: She is underweight and well-nourished. She is not diaphoretic.  Neck:     Thyroid: No thyromegaly.  Cardiovascular:     Rate and Rhythm: Normal rate and regular rhythm.     Pulses: Normal pulses and intact distal pulses.     Heart sounds: Normal heart sounds.  Pulmonary:     Effort: Pulmonary effort is normal. No respiratory distress.     Breath sounds: Normal breath sounds.  Abdominal:     General: Bowel sounds are normal. There is no distension.     Palpations: Abdomen is soft.     Tenderness: There is no abdominal tenderness.  Musculoskeletal:        General: No edema.     Cervical back: Neck supple.     Right lower leg: No edema.     Left lower leg: No edema.     Comments:  Is able to move all extremities  Uses wheelchair  History of left femur ORIF      Lymphadenopathy:     Cervical: No cervical adenopathy.  Skin:    General: Skin is warm and dry.  Neurological:     Mental Status: She is alert. Mental status is at baseline.  Psychiatric:        Mood and Affect: Mood and affect and mood normal.     ASSESSMENT/ PLAN:  TODAY:   1. Aortic atherosclerosis: is stable (ct 05-25-19)  will monitor   2. CKD (chronic kidney disease)  stage IV: is stable bun 48; creat 2.11 will continue sodium bicarbonate 650 mg twice daily  3. Benign hypertension with CKD (chronic kidney disease) stage IV is stable b/p 114/83; will continue lopressor 50 mg twice daily norvasc 5 mg daily   PREVIOUS   4. Vitamin D deficiency: is  stable level 124.76 is off supplement    5. Colon cancer is without change will monitor  6. Dementia without behavioral disturbance unspecified dementia type: is without change weight is 92 pounds; she is slowly losing weight which is an unfortunate expected outcome in the late stages of this disease process.   7. Chronic idiopathic constipation: is stable off medications will monitor   8. Left femoral shaft fracture history of left femur ORIF is stable will monitor   9. Unspecified iron deficiency anemia: is stable hgb 11.2 will continue iron daily    MD is aware of resident's narcotic use and is in agreement with current plan of care. We will attempt to wean resident as appropriate.  Ok Edwards NP [facility]  Contact [phone] Monday through Friday 8am- 5pm  After hours call [phone]

## 2020-09-30 DIAGNOSIS — I129 Hypertensive chronic kidney disease with stage 1 through stage 4 chronic kidney disease, or unspecified chronic kidney disease: Secondary | ICD-10-CM | POA: Diagnosis not present

## 2020-09-30 DIAGNOSIS — Z1159 Encounter for screening for other viral diseases: Secondary | ICD-10-CM | POA: Diagnosis not present

## 2020-10-02 DIAGNOSIS — Z1159 Encounter for screening for other viral diseases: Secondary | ICD-10-CM | POA: Diagnosis not present

## 2020-10-02 DIAGNOSIS — I129 Hypertensive chronic kidney disease with stage 1 through stage 4 chronic kidney disease, or unspecified chronic kidney disease: Secondary | ICD-10-CM | POA: Diagnosis not present

## 2020-10-05 DIAGNOSIS — I129 Hypertensive chronic kidney disease with stage 1 through stage 4 chronic kidney disease, or unspecified chronic kidney disease: Secondary | ICD-10-CM | POA: Diagnosis not present

## 2020-10-05 DIAGNOSIS — Z1159 Encounter for screening for other viral diseases: Secondary | ICD-10-CM | POA: Diagnosis not present

## 2020-10-07 DIAGNOSIS — I129 Hypertensive chronic kidney disease with stage 1 through stage 4 chronic kidney disease, or unspecified chronic kidney disease: Secondary | ICD-10-CM | POA: Diagnosis not present

## 2020-10-07 DIAGNOSIS — Z1159 Encounter for screening for other viral diseases: Secondary | ICD-10-CM | POA: Diagnosis not present

## 2020-10-09 DIAGNOSIS — Z1159 Encounter for screening for other viral diseases: Secondary | ICD-10-CM | POA: Diagnosis not present

## 2020-10-09 DIAGNOSIS — I129 Hypertensive chronic kidney disease with stage 1 through stage 4 chronic kidney disease, or unspecified chronic kidney disease: Secondary | ICD-10-CM | POA: Diagnosis not present

## 2020-10-12 DIAGNOSIS — I129 Hypertensive chronic kidney disease with stage 1 through stage 4 chronic kidney disease, or unspecified chronic kidney disease: Secondary | ICD-10-CM | POA: Diagnosis not present

## 2020-10-12 DIAGNOSIS — Z1159 Encounter for screening for other viral diseases: Secondary | ICD-10-CM | POA: Diagnosis not present

## 2020-10-14 DIAGNOSIS — I129 Hypertensive chronic kidney disease with stage 1 through stage 4 chronic kidney disease, or unspecified chronic kidney disease: Secondary | ICD-10-CM | POA: Diagnosis not present

## 2020-10-14 DIAGNOSIS — Z1159 Encounter for screening for other viral diseases: Secondary | ICD-10-CM | POA: Diagnosis not present

## 2020-10-16 DIAGNOSIS — I129 Hypertensive chronic kidney disease with stage 1 through stage 4 chronic kidney disease, or unspecified chronic kidney disease: Secondary | ICD-10-CM | POA: Diagnosis not present

## 2020-10-16 DIAGNOSIS — Z1159 Encounter for screening for other viral diseases: Secondary | ICD-10-CM | POA: Diagnosis not present

## 2020-10-19 DIAGNOSIS — I129 Hypertensive chronic kidney disease with stage 1 through stage 4 chronic kidney disease, or unspecified chronic kidney disease: Secondary | ICD-10-CM | POA: Diagnosis not present

## 2020-10-19 DIAGNOSIS — Z1159 Encounter for screening for other viral diseases: Secondary | ICD-10-CM | POA: Diagnosis not present

## 2020-10-21 DIAGNOSIS — Z1159 Encounter for screening for other viral diseases: Secondary | ICD-10-CM | POA: Diagnosis not present

## 2020-10-21 DIAGNOSIS — I129 Hypertensive chronic kidney disease with stage 1 through stage 4 chronic kidney disease, or unspecified chronic kidney disease: Secondary | ICD-10-CM | POA: Diagnosis not present

## 2020-11-02 ENCOUNTER — Non-Acute Institutional Stay (SKILLED_NURSING_FACILITY): Payer: Medicare HMO | Admitting: Adult Health

## 2020-11-02 ENCOUNTER — Encounter: Payer: Self-pay | Admitting: Adult Health

## 2020-11-02 DIAGNOSIS — D509 Iron deficiency anemia, unspecified: Secondary | ICD-10-CM

## 2020-11-02 DIAGNOSIS — F0390 Unspecified dementia without behavioral disturbance: Secondary | ICD-10-CM | POA: Diagnosis not present

## 2020-11-02 DIAGNOSIS — K5904 Chronic idiopathic constipation: Secondary | ICD-10-CM | POA: Diagnosis not present

## 2020-11-02 NOTE — Progress Notes (Signed)
Location:  [facility] Room Number: 762 Place of Service:  SNF (31)   CODE STATUS: dnr  No Known Allergies  Chief Complaint  Patient presents with  . Medical Management of Chronic Issues          Dementia without behavioral disturbance unspecified dementia type:    Chronic idiopathic constipation:    Unspecified iron deficiency anemia:    HPI:  She is a 87 year old long term resident of this facility being seen for the management of her chronic illnesses;  Dementia without behavioral disturbance unspecified dementia type:    Chronic idiopathic constipation:    Unspecified iron deficiency anemia:. She does get out of bed daily. She does use wheelchair. There are no reports of agitation or uncontrolled pain.   Past Medical History:  Diagnosis Date  . Anemia   . Arthritis   . History of blood transfusion    "today is the first time" (08/05/2014)  . Hypertension   . Left femoral shaft fracture ([place]) 08/08/2014    Past Surgical History:  Procedure Laterality Date  . APPENDECTOMY    . CATARACT EXTRACTION Left   . CHOLECYSTECTOMY    . COLONOSCOPY N/A 09/26/2014   Procedure: COLONOSCOPY;  Surgeon: [name], MD;  [facility];  Service: Endoscopy;  Laterality: N/A;  1030am  . CYSTECTOMY     "had cyst taken off lower back"  . ESOPHAGOGASTRODUODENOSCOPY N/A 08/22/2014   GBT:DVVOHYWV ring at the gastro junction/small HH  . ORIF FEMUR FRACTURE Left 08/07/2014   Procedure: OPEN REDUCTION INTERNAL FIXATION (ORIF)  LEFT FEMUR FRACTURE;  Surgeon: [name], MD;  Location: [place];  Service: Orthopedics;  Laterality: Left;  . TONSILLECTOMY      Social History   Socioeconomic History  . Marital status: Widowed    Spouse name: Not on file  . Number of children: Not on file  . Years of education: Not on file  . Highest education level: Not on file  Occupational History  . Occupation: retired   Tobacco Use  . Smoking status: Former Smoker     Packs/day: 0.25    Years: 16.00    Pack years: 4.00    Types: Cigarettes  . Smokeless tobacco: Never Used  Vaping Use  . Vaping Use: Never used  Substance and Sexual Activity  . Alcohol use: No  . Drug use: No  . Sexual activity: Never  Other Topics Concern  . Not on file  Social History Narrative   Long term resident of [facility]    Social Determinants of Health   Financial Resource Strain: Not on file  Food Insecurity: Not on file  Transportation Needs: Not on file  Physical Activity: Not on file  Stress: Not on file  Social Connections: Not on file  Intimate Partner Violence: Not on file   Family History  Family history unknown: Yes      VITAL SIGNS BP (!) 150/65   Pulse (!) 56   Temp 97.7 F (36.5 C)   Ht 4\' 11"  (1.499 m)   Wt 99 lb (44.9 kg)   BMI 20.00 kg/m   Outpatient Encounter Medications as of 11/02/2020  Medication Sig  . acetaminophen (TYLENOL) 325 MG tablet Take 650 mg by mouth every 6 (six) hours as needed.  Marland Kitchen amLODipine (NORVASC) 5 MG tablet Take 5 mg by mouth daily.   . ferrous sulfate 325 (65 FE) MG tablet Take 325 mg by mouth daily with  breakfast.   . metoprolol (LOPRESSOR) 50 MG tablet Take 1 tablet (50 mg total) by mouth 2 (two) times daily.  . NON FORMULARY Diet Change: Dysphagia 1 (puree), continue thin liquids  . NON FORMULARY Wanderguard for safety awareness. Check placement and function qshift. Alert tag number [id number]. Every Shift Day, Evening, Night  . Polyethyl Glycol-Propyl Glycol (SYSTANE) 0.4-0.3 % GEL ophthalmic gel Place 1 application into both eyes 2 (two) times daily at 10 AM and 5 PM.  . sodium bicarbonate 650 MG tablet Take 650 mg by mouth 2 (two) times daily.   No facility-administered encounter medications on file as of 11/02/2020.     SIGNIFICANT DIAGNOSTIC EXAMS   PREVIOUS;   04-28-19: right hip and pelvic x-ray: There is no acute fracture or subluxation. Dense atherosclerotic calcification of the femoral arteries.  Degenerative changes are seen in the LOWER lumbar spine. Coarse calcifications in the pelvis are consistent with calcified fibroids. Bowel sutures are noted in the LOWER pelvis  NO NEW EXAMS.    LABS REVIEWED PREVIOUS;   12-05-19: vit D 22.50 01-30-20: wbc 6.3; hgb 10.8; hct 35.1; mcv 86.0 plt 284; glucose 95; bun 52; creat 2.04; k+ 3.9; na++ 136; ca 9.3 liver normal albumin 4.0 tsh 2.055  07-29-20: wbc 5.3; hgb 11.2; hct 36.4; mcv 87.3 plt 269; glucose 103; bun 48; creat 2.11; k+ 4.2; na++ 139; ca 9.7 liver normal albumin 3.9 tsh 1.674 vit D 124.76   NO NEW LABS.   Review of Systems  Unable to perform ROS: Dementia (unable to participate )    Physical Exam Constitutional:      General: She is not in acute distress.    Appearance: She is underweight and well-nourished. She is not diaphoretic.  Neck:     Thyroid: No thyromegaly.  Cardiovascular:     Rate and Rhythm: Normal rate and regular rhythm.     Pulses: Normal pulses and intact distal pulses.     Heart sounds: Normal heart sounds.  Pulmonary:     Effort: Pulmonary effort is normal. No respiratory distress.     Breath sounds: Normal breath sounds.  Abdominal:     General: Bowel sounds are normal. There is no distension.     Palpations: Abdomen is soft.     Tenderness: There is no abdominal tenderness.  Musculoskeletal:        General: No edema.     Cervical back: Neck supple.     Right lower leg: No edema.     Left lower leg: No edema.     Comments: Is able to move all extremities  Uses wheelchair  History of left femur ORIF       Lymphadenopathy:     Cervical: No cervical adenopathy.  Skin:    General: Skin is warm and dry.  Neurological:     Mental Status: She is alert. Mental status is at baseline.  Psychiatric:        Mood and Affect: Mood and affect and mood normal.      ASSESSMENT/ PLAN:  TODAY:   1. Dementia without behavioral disturbance unspecified dementia type: is without change weight is 99  pounds; is slowly losing weight over time; which is an unfortunate out come at the end stages of his disease.   2. Chronic idiopathic constipation: is stable off medications will monitor  3. Unspecified iron deficiency anemia: is stable hgb 11.2 will continue iron daily   PREVIOUS   4. Vitamin D deficiency: is stable level 124.76  is off supplement    5. Colon cancer is without change will monitor  6. Left femoral shaft fracture history of left femur ORIF is stable will monitor   7. Aortic atherosclerosis: is stable (ct 05-25-19)  will monitor   8. CKD (chronic kidney disease) stage IV: is stable bun 48; creat 2.11 will continue sodium bicarbonate 650 mg twice daily  9. Benign hypertension with CKD (chronic kidney disease) stage IV is stable b/p 150/65; will lower  lopressor to  25 mg twice daily norvasc 5 mg daily      MD is aware of resident's narcotic use and is in agreement with current plan of care. We will attempt to wean resident as appropriate.  Ok Edwards NP [facility]  Contact [phone] Monday through Friday 8am- 5pm  After hours call [phone]

## 2020-11-04 DIAGNOSIS — H2521 Age-related cataract, morgagnian type, right eye: Secondary | ICD-10-CM | POA: Diagnosis not present

## 2020-11-04 DIAGNOSIS — Z961 Presence of intraocular lens: Secondary | ICD-10-CM | POA: Diagnosis not present

## 2020-11-04 DIAGNOSIS — H11043 Peripheral pterygium, stationary, bilateral: Secondary | ICD-10-CM | POA: Diagnosis not present

## 2020-11-04 DIAGNOSIS — H04123 Dry eye syndrome of bilateral lacrimal glands: Secondary | ICD-10-CM | POA: Diagnosis not present

## 2020-11-11 ENCOUNTER — Non-Acute Institutional Stay (SKILLED_NURSING_FACILITY): Payer: Medicare HMO | Admitting: Adult Health

## 2020-11-11 ENCOUNTER — Encounter: Payer: Self-pay | Admitting: Adult Health

## 2020-11-11 DIAGNOSIS — C189 Malignant neoplasm of colon, unspecified: Secondary | ICD-10-CM | POA: Diagnosis not present

## 2020-11-11 DIAGNOSIS — F0390 Unspecified dementia without behavioral disturbance: Secondary | ICD-10-CM | POA: Diagnosis not present

## 2020-11-11 DIAGNOSIS — I700 Atherosclerosis of aorta: Secondary | ICD-10-CM | POA: Diagnosis not present

## 2020-11-11 NOTE — Progress Notes (Signed)
Location:  [facility] Room Number: 294 Place of Service:  SNF (31)   CODE STATUS: dnr   No Known Allergies  Chief Complaint  Patient presents with  . Acute Visit    Care plan meeting.     HPI:  We have come together for her care plan meeting. No BIMS; no mood. she requires limited to extensive assist with adls. She does feed herself is nonambulatory. She frequently incontinent of bladder and incontinent of bowel. She has had one fall 10-14-20: no injury. Weight is 89 pounds is down a pound a month for the past 6 months.  There are no reports of pain. She continues to be followed for her chronic illnesses including: Dementia without behavioral disturbance unspecified dementia type Aortic atherosclerosis Malignant neoplasm of colon unspecified part of colon  Past Medical History:  Diagnosis Date  . Anemia   . Arthritis   . History of blood transfusion    "today is the first time" (08/05/2014)  . Hypertension   . Left femoral shaft fracture ([place]) 08/08/2014    Past Surgical History:  Procedure Laterality Date  . APPENDECTOMY    . CATARACT EXTRACTION Left   . CHOLECYSTECTOMY    . COLONOSCOPY N/A 09/26/2014   Procedure: COLONOSCOPY;  Surgeon: [name], MD;  [facility];  Service: Endoscopy;  Laterality: N/A;  1030am  . CYSTECTOMY     "had cyst taken off lower back"  . ESOPHAGOGASTRODUODENOSCOPY N/A 08/22/2014   TML:[id number] ring at the gastro junction/small HH  . ORIF FEMUR FRACTURE Left 08/07/2014   Procedure: OPEN REDUCTION INTERNAL FIXATION (ORIF)  LEFT FEMUR FRACTURE;  Surgeon: [name], MD;  Location: [facility];  Service: Orthopedics;  Laterality: Left;  . TONSILLECTOMY      Social History   Socioeconomic History  . Marital status: Widowed    Spouse name: Not on file  . Number of children: Not on file  . Years of education: Not on file  . Highest education level: Not on file  Occupational History  . Occupation: retired    Tobacco Use  . Smoking status: Former Smoker    Packs/day: 0.25    Years: 16.00    Pack years: 4.00    Types: Cigarettes  . Smokeless tobacco: Never Used  Vaping Use  . Vaping Use: Never used  Substance and Sexual Activity  . Alcohol use: No  . Drug use: No  . Sexual activity: Never  Other Topics Concern  . Not on file  Social History Narrative   [facility] term resident of [facility]    Social Determinants of Health   Financial Resource Strain: Not on file  Food Insecurity: Not on file  Transportation Needs: Not on file  Physical Activity: Not on file  Stress: Not on file  Social Connections: Not on file  Intimate Partner Violence: Not on file   Family History  Family history unknown: Yes      VITAL SIGNS BP (!) 149/63   Pulse 69   Temp (!) 96.9 F (36.1 C)   Resp 18   Ht 4\' 11"  (1.499 m)   Wt 89 lb 9.6 oz (40.6 kg)   BMI 18.10 kg/m   Outpatient Encounter Medications as of 11/11/2020  Medication Sig  . acetaminophen (TYLENOL) 325 MG tablet Take 650 mg by mouth every 6 (six) hours as needed.  Marland Kitchen amLODipine (NORVASC) 5 MG tablet Take 5 mg by mouth daily.   . ferrous sulfate  325 (65 FE) MG tablet Take 325 mg by mouth daily with breakfast.   . metoprolol (LOPRESSOR) 50 MG tablet Take 1 tablet (50 mg total) by mouth 2 (two) times daily.  . NON FORMULARY Diet Change: Dysphagia 1 (puree), continue thin liquids  . NON FORMULARY Wanderguard for safety awareness. Check placement and function qshift. Alert tag number [id number]. Every Shift Day, Evening, Night  . Polyethyl Glycol-Propyl Glycol (SYSTANE) 0.4-0.3 % GEL ophthalmic gel Place 1 application into both eyes 2 (two) times daily at 10 AM and 5 PM.  . sodium bicarbonate 650 MG tablet Take 650 mg by mouth 2 (two) times daily.   No facility-administered encounter medications on file as of 11/11/2020.     SIGNIFICANT DIAGNOSTIC EXAMS   PREVIOUS;   04-28-19: right hip and pelvic x-ray: There is no acute fracture or subluxation.  Dense atherosclerotic calcification of the femoral arteries. Degenerative changes are seen in the LOWER lumbar spine. Coarse calcifications in the pelvis are consistent with calcified fibroids. Bowel sutures are noted in the LOWER pelvis  NO NEW EXAMS.    LABS REVIEWED PREVIOUS;   12-05-19: vit D 22.50 01-30-20: wbc 6.3; hgb 10.8; hct 35.1; mcv 86.0 plt 284; glucose 95; bun 52; creat 2.04; k+ 3.9; na++ 136; ca 9.3 liver normal albumin 4.0 tsh 2.055  07-29-20: wbc 5.3; hgb 11.2; hct 36.4; mcv 87.3 plt 269; glucose 103; bun 48; creat 2.11; k+ 4.2; na++ 139; ca 9.7 liver normal albumin 3.9 tsh 1.674 vit D 124.76   NO NEW LABS.   Review of Systems  Unable to perform ROS: Dementia (unable to participate )    Physical Exam Constitutional:      General: She is not in acute distress.    Appearance: She is underweight and well-nourished. She is not diaphoretic.  Neck:     Thyroid: No thyromegaly.  Cardiovascular:     Rate and Rhythm: Normal rate and regular rhythm.     Pulses: Normal pulses and intact distal pulses.     Heart sounds: Normal heart sounds.  Pulmonary:     Effort: Pulmonary effort is normal. No respiratory distress.     Breath sounds: Normal breath sounds.  Abdominal:     General: Bowel sounds are normal. There is no distension.     Palpations: Abdomen is soft.     Tenderness: There is no abdominal tenderness.  Musculoskeletal:        General: No edema.     Cervical back: Neck supple.     Right lower leg: No edema.     Left lower leg: No edema.     Comments: Is able to move all extremities  Uses wheelchair  History of left femur ORIF        Lymphadenopathy:     Cervical: No cervical adenopathy.  Skin:    General: Skin is warm and dry.  Neurological:     Mental Status: She is alert. Mental status is at baseline.  Psychiatric:        Mood and Affect: Mood and affect and mood normal.      ASSESSMENT/ PLAN:  TODAY  1. Dementia without behavioral disturbance  unspecified dementia type 2. Aortic atherosclerosis 3. Malignant neoplasm of colon unspecified part of colon  Will continue current medications Will continue current plan of care Will continue to monitor her status.     Ok Edwards NP [facility]  Contact [phone] Monday through Friday 8am- 5pm  After hours call [phone]

## 2020-11-24 ENCOUNTER — Non-Acute Institutional Stay (SKILLED_NURSING_FACILITY): Payer: Medicare HMO | Admitting: Adult Health

## 2020-11-24 ENCOUNTER — Encounter: Payer: Self-pay | Admitting: Adult Health

## 2020-11-24 DIAGNOSIS — K5904 Chronic idiopathic constipation: Secondary | ICD-10-CM | POA: Diagnosis not present

## 2020-11-24 DIAGNOSIS — N184 Chronic kidney disease, stage 4 (severe): Secondary | ICD-10-CM

## 2020-11-24 DIAGNOSIS — Z8781 Personal history of (healed) traumatic fracture: Secondary | ICD-10-CM | POA: Diagnosis not present

## 2020-11-24 DIAGNOSIS — D649 Anemia, unspecified: Secondary | ICD-10-CM

## 2020-11-24 DIAGNOSIS — F0390 Unspecified dementia without behavioral disturbance: Secondary | ICD-10-CM

## 2020-11-24 DIAGNOSIS — I700 Atherosclerosis of aorta: Secondary | ICD-10-CM | POA: Diagnosis not present

## 2020-11-24 DIAGNOSIS — I129 Hypertensive chronic kidney disease with stage 1 through stage 4 chronic kidney disease, or unspecified chronic kidney disease: Secondary | ICD-10-CM

## 2020-11-24 DIAGNOSIS — R627 Adult failure to thrive: Secondary | ICD-10-CM

## 2020-11-24 DIAGNOSIS — E559 Vitamin D deficiency, unspecified: Secondary | ICD-10-CM

## 2020-11-24 DIAGNOSIS — Z85038 Personal history of other malignant neoplasm of large intestine: Secondary | ICD-10-CM | POA: Diagnosis not present

## 2020-11-24 NOTE — Progress Notes (Signed)
Provider:[facility] Location  SNF   PCP: Gerlene Fee, NP   Extended Emergency Contact Information Primary Emergency Contact: [name] [place] Phone: [phone] Mobile Phone: [phone] Relation: Niece Secondary Emergency Contact: Zena Amos [place] Phone: [phone] Relation: Neighbor  Codes status: dnr Goals of care: advanced directive information Advanced Directives 09/29/2020  Does Patient Have a Medical Advance Directive? Yes  Type of Advance Directive Out of facility DNR (pink MOST or yellow form)  Does patient want to make changes to medical advance directive? -  Copy of [employer] in Chart? -  Would patient like information on creating a medical advance directive? -  Pre-existing out of facility DNR order (yellow form or pink MOST form) Yellow form placed in chart (order not valid for inpatient use)     No Known Allergies  Chief Complaint  Patient presents with  . Annual Exam    HPI  She is a 87 year old long term resident of this facility being seen for her annual exam. She has not been hospitalized over the past year and has not required any ED visits. She remains DNR. She continues to get out of bed daily; and will usually sit in front of window daily. She is slowly losing weight with her current weight of 89.6 pounds. There are no reports of pain present. She does have some occasional constipation. She continues to be followed for her chronic illnesses including: Dementia without behavioral disturbance unspecified dementia type:  Chronic idiopathic constipation:Unspecified iron deficiency anemia:    Past Medical History:  Diagnosis Date  . Anemia   . Arthritis   . History of blood transfusion    "today is the first time" (08/05/2014)  . Hypertension   . Left femoral shaft fracture ([place]) 08/08/2014   Past Surgical History:  Procedure Laterality Date  .  APPENDECTOMY    . CATARACT EXTRACTION Left   . CHOLECYSTECTOMY    . COLONOSCOPY N/A 09/26/2014   Procedure: COLONOSCOPY;  Surgeon: [name], MD;  [facility];  Service: Endoscopy;  Laterality: N/A;  1030am  . CYSTECTOMY     "had cyst taken off lower back"  . ESOPHAGOGASTRODUODENOSCOPY N/A 08/22/2014   GYF:[id number] ring at the gastro junction/small HH  . ORIF FEMUR FRACTURE Left 08/07/2014   Procedure: OPEN REDUCTION INTERNAL FIXATION (ORIF)  LEFT FEMUR FRACTURE;  Surgeon: [name], MD;  Location: [facility];  Service: Orthopedics;  Laterality: Left;  . TONSILLECTOMY      reports that she has quit smoking. Her smoking use included cigarettes. She has a 4.00 pack-year smoking history. She has never used smokeless tobacco. She reports that she does not drink alcohol and does not use drugs. Social History   Tobacco Use  . Smoking status: Former Smoker    Packs/day: 0.25    Years: 16.00    Pack years: 4.00    Types: Cigarettes  . Smokeless tobacco: Never Used  Vaping Use  . Vaping Use: Never used  Substance Use Topics  . Alcohol use: No  . Drug use: No   Family History  Family history unknown: Yes    Pertinent  Health Maintenance Due  Topic Date Due  . INFLUENZA VACCINE  04/05/2020  . DEXA SCAN  Completed  . PNA vac Low Risk Adult  Completed   Fall Risk  11/29/2019 07/31/2019 06/12/2018 06/05/2017  Falls in the past year? 0 1 No Yes  Number falls in past yr: - 0 - 1  Injury with Fall? - 0 - Yes  Risk for fall due to : - Impaired balance/gait;Impaired mobility - -   Depression screen [facility] 2/9 11/29/2019 07/31/2019 06/12/2018 06/05/2017  Decreased Interest 0 0 0 0  Down, Depressed, Hopeless 0 0 0 0  PHQ - 2 Score 0 0 0 0    Functional Status Survey:    Outpatient Encounter Medications as of 11/24/2020  Medication Sig  . acetaminophen (TYLENOL) 325 MG tablet Take 650 mg by mouth every 6 (six) hours as needed.  Marland Kitchen amLODipine (NORVASC) 5 MG tablet Take 5 mg by  mouth daily.   . ferrous sulfate 325 (65 FE) MG tablet Take 325 mg by mouth daily with breakfast.   . metoprolol (LOPRESSOR) 50 MG tablet Take 1 tablet (50 mg total) by mouth 2 (two) times daily.  . NON FORMULARY Diet Change: Dysphagia 1 (puree), continue thin liquids  . NON FORMULARY Wanderguard for safety awareness. Check placement and function qshift. Alert tag number [id number]. Every Shift Day, Evening, Night  . Polyethyl Glycol-Propyl Glycol (SYSTANE) 0.4-0.3 % GEL ophthalmic gel Place 1 application into both eyes 2 (two) times daily at 10 AM and 5 PM.  . sodium bicarbonate 650 MG tablet Take 650 mg by mouth 2 (two) times daily.   No facility-administered encounter medications on file as of 11/24/2020.     Vitals:   11/24/20 1306  BP: (!) 155/75  Pulse: 65  Resp: 18  Temp: 97.6 F (36.4 C)  SpO2: 96%  Weight: 89 lb 9.6 oz (40.6 kg)  Height: 4\' 11"  (1.499 m)   Body mass index is 18.1 kg/m.  DIAGNOSTIC EXAMS   NO RECENT EXAMS.    LABS REVIEWED PREVIOUS;   12-05-19: vit D 22.50 01-30-20: wbc 6.3; hgb 10.8; hct 35.1; mcv 86.0 plt 284; glucose 95; bun 52; creat 2.04; k+ 3.9; na++ 136; ca 9.3 liver normal albumin 4.0 tsh 2.055  07-29-20: wbc 5.3; hgb 11.2; hct 36.4; mcv 87.3 plt 269; glucose 103; bun 48; creat 2.11; k+ 4.2; na++ 139; ca 9.7 liver normal albumin 3.9 tsh 1.674 vit D 124.76   NO NEW LABS.   Review of Systems  Unable to perform ROS: Dementia (unable to participate )    Physical Exam Constitutional:      General: She is not in acute distress.    Appearance: She is underweight. She is not diaphoretic.  HENT:     Right Ear: Ear canal and external ear normal.     Left Ear: Ear canal and external ear normal.     Mouth/Throat:     Mouth: Mucous membranes are moist.     Pharynx: Oropharynx is clear.  Eyes:     Conjunctiva/sclera: Conjunctivae normal.  Neck:     Thyroid: No thyromegaly.  Cardiovascular:     Rate and Rhythm: Normal rate and regular rhythm.      Pulses: Normal pulses.     Heart sounds: Normal heart sounds.  Pulmonary:     Effort: Pulmonary effort is normal. No respiratory distress.     Breath sounds: Normal breath sounds.  Abdominal:     General: Bowel sounds are normal. There is no distension.     Palpations: Abdomen is soft.     Tenderness: There is no abdominal tenderness.  Musculoskeletal:     Cervical back: Neck supple.     Right lower leg: No edema.     Left lower leg:  No edema.     Comments: Is able to move all extremities  Uses wheelchair  History of left femur ORIF         Lymphadenopathy:     Cervical: No cervical adenopathy.  Skin:    General: Skin is warm and dry.  Neurological:     Mental Status: She is alert. Mental status is at baseline.  Psychiatric:        Mood and Affect: Mood normal.     ASSESSMENT/ PLAN:  TODAY:   1. Dementia without behavioral disturbance unspecified dementia type: is without change weight is 89 pounds; continues to slowly lose weight. Unfortunately weight loss is an expected outcome at the late stages of this disease process.   2. Chronic idiopathic constipation: is stable off medications will monitor   3. Unspecified iron deficiency anemia: is stable hgb 11.2 will continue iron daily   4. Vitamin D deficiency: is stable level 124.76 will monitor  5. History of colon cancer will monitor  6. Left femoral shaft fracture with history of left femur ORIF is stable will monitor   7. Aortic atherosclerosis (ct 05-25-19) will monitor   8. CKD (chronic kidney disease) stage IV stable bun 48; creat 2.11 will continue sodium bicarbonate 650 mg twice daily   8. Benign hypertension with CKD (chronic kidney disease) stage IV: is stable b/p 155/75 will continue lopressor 25 mg twice daily norvasc 5 mg daily   9. Failure to thrive in adult: her weight is 89 pounds will continue to monitor her status; supplements as directed.      Ok Edwards NP [facility]  Contact  [phone] Monday through Friday 8am- 5pm  After hours call [phone]

## 2020-11-30 ENCOUNTER — Non-Acute Institutional Stay (SKILLED_NURSING_FACILITY): Payer: Medicare HMO | Admitting: Adult Health

## 2020-11-30 DIAGNOSIS — Z0000 Encounter for general adult medical examination without abnormal findings: Secondary | ICD-10-CM | POA: Diagnosis not present

## 2020-11-30 NOTE — Progress Notes (Signed)
Subjective:   Alicia Guerra is a 87 y.o. female who presents for Medicare Annual (Subsequent) preventive examination.  Review of Systems    Review of Systems  Unable to perform ROS: Dementia (unable to participate )    Cardiac Risk Factors include: advanced age (>46men, >42 women);hypertension     Objective:    Today's Vitals   11/30/20 1142  BP: 138/61  Pulse: 64  Resp: 18  Temp: 98.2 F (36.8 C)  Weight: 89 lb 9.6 oz (40.6 kg)  Height: 4\' 11"  (1.499 m)   Body mass index is 18.1 kg/m.  Advanced Directives 09/29/2020 09/03/2020 08/26/2020 07/28/2020 06/29/2020 06/04/2020 05/27/2020  Does Patient Have a Medical Advance Directive? Yes Yes Yes Yes Yes Yes Yes  Type of Advance Directive Out of facility DNR (pink MOST or yellow form) Out of facility DNR (pink MOST or yellow form) Out of facility DNR (pink MOST or yellow form) Out of facility DNR (pink MOST or yellow form) Out of facility DNR (pink MOST or yellow form) Out of facility DNR (pink MOST or yellow form) Out of facility DNR (pink MOST or yellow form)  Does patient want to make changes to medical advance directive? - No - Patient declined No - Patient declined No - Patient declined No - Patient declined No - Patient declined No - Patient declined  Copy of Healthcare Power of Attorney in Chart? - - - - - - -  Would patient like information on creating a medical advance directive? - - - - - - -  Pre-existing out of facility DNR order (yellow form or pink MOST form) Yellow form placed in chart (order not valid for inpatient use) Yellow form placed in chart (order not valid for inpatient use) Yellow form placed in chart (order not valid for inpatient use) Yellow form placed in chart (order not valid for inpatient use) Yellow form placed in chart (order not valid for inpatient use) Yellow form placed in chart (order not valid for inpatient use) Yellow form placed in chart (order not valid for inpatient use)    Current  Medications (verified) Outpatient Encounter Medications as of 11/30/2020  Medication Sig  . acetaminophen (TYLENOL) 325 MG tablet Take 650 mg by mouth every 6 (six) hours as needed.  Marland Kitchen amLODipine (NORVASC) 5 MG tablet Take 5 mg by mouth daily.   . ferrous sulfate 325 (65 FE) MG tablet Take 325 mg by mouth daily with breakfast.   . metoprolol (LOPRESSOR) 50 MG tablet Take 1 tablet (50 mg total) by mouth 2 (two) times daily.  . NON FORMULARY Diet Change: Dysphagia 1 (puree), continue thin liquids  . NON FORMULARY Wanderguard for safety awareness. Check placement and function qshift. Alert tag number [id number]. Every Shift Day, Evening, Night  . Polyethyl Glycol-Propyl Glycol (SYSTANE) 0.4-0.3 % GEL ophthalmic gel Place 1 application into both eyes 2 (two) times daily at 10 AM and 5 PM.  . sodium bicarbonate 650 MG tablet Take 650 mg by mouth 2 (two) times daily.   No facility-administered encounter medications on file as of 11/30/2020.    Allergies (verified) Patient has no known allergies.   History: Past Medical History:  Diagnosis Date  . Anemia   . Arthritis   . History of blood transfusion    "today is the first time" (08/05/2014)  . Hypertension   . Left femoral shaft fracture ([place]) 08/08/2014   Past Surgical History:  Procedure Laterality Date  . APPENDECTOMY    . CATARACT  EXTRACTION Left   . CHOLECYSTECTOMY    . COLONOSCOPY N/A 09/26/2014   Procedure: COLONOSCOPY;  Surgeon: [name], MD;  [facility];  Service: Endoscopy;  Laterality: N/A;  1030am  . CYSTECTOMY     "had cyst taken off lower back"  . ESOPHAGOGASTRODUODENOSCOPY N/A 08/22/2014   VEL:[id number] ring at the gastro junction/small HH  . ORIF FEMUR FRACTURE Left 08/07/2014   Procedure: OPEN REDUCTION INTERNAL FIXATION (ORIF)  LEFT FEMUR FRACTURE;  Surgeon: [name], MD;  Location: [place];  Service: Orthopedics;  Laterality: Left;  . TONSILLECTOMY     Family History  Family history unknown: Yes    Social History   Socioeconomic History  . Marital status: Widowed    Spouse name: Not on file  . Number of children: Not on file  . Years of education: Not on file  . Highest education level: Not on file  Occupational History  . Occupation: retired   Tobacco Use  . Smoking status: Former Smoker    Packs/day: 0.25    Years: 16.00    Pack years: 4.00    Types: Cigarettes  . Smokeless tobacco: Never Used  Vaping Use  . Vaping Use: Never used  Substance and Sexual Activity  . Alcohol use: No  . Drug use: No  . Sexual activity: Never  Other Topics Concern  . Not on file  Social History Narrative   Long term resident of [facility]    Social Determinants of Health   Financial Resource Strain: Not on file  Food Insecurity: Not on file  Transportation Needs: Not on file  Physical Activity: Not on file  Stress: Not on file  Social Connections: Not on file    Tobacco Counseling Counseling given: Not Answered   Clinical Intake:  Pre-visit preparation completed: Yes  Pain : No/denies pain     BMI - recorded: 18.1 Nutritional Status: BMI <19  Underweight Nutritional Risks: Failure to thrive Diabetes: No  How often do you need to have someone help you when you read instructions, pamphlets, or other written materials from your doctor or pharmacy?: 5 - [place] Needed?: No      Activities of Daily Living In your present state of health, do you have any difficulty performing the following activities: 11/30/2020  Hearing? Y  Vision? N  Difficulty concentrating or making decisions? Y  Walking or climbing stairs? Y  Dressing or bathing? Y  Doing errands, shopping? Y  Preparing Food and eating ? Y  Using the Toilet? Y  In the past six months, have you accidently leaked urine? Y  Do you have problems with loss of bowel control? Y  Managing your Medications? Y  Managing your Finances? Y  Housekeeping or managing your Housekeeping? Y  Some  recent data might be hidden    Patient Care Team: Gerlene Fee, NP as PCP - General (Geriatric Medicine) [name], MD (Inactive) as Consulting Physician (Gastroenterology) [facility], [place] ([place])  Indicate any recent [place] you may have received from other than [facility] providers in the past year (date may be approximate).     Assessment:   This is a routine wellness examination for Latrenda.  Hearing/Vision screen No exam data present  Dietary issues and exercise activities discussed: Current Exercise Habits: The patient does not participate in regular exercise at present  Goals    . Absence of Fall and Fall-Related Injury     Evidence-based  guidance:   Assess fall risk using a validated tool when available. Consider balance and gait impairment, muscle weakness, diminished vision or hearing, environmental hazards, presence of urinary or bowel urgency and/or incontinence.   Communicate fall injury risk to interprofessional healthcare team.   Develop a fall prevention plan with the patient and family.   Promote use of personal vision and auditory aids.   Promote reorientation, appropriate sensory stimulation, and routines to decrease risk of fall when changes in mental status are present.   Assess assistance level required for safe and effective self-care; consider referral for home care.   Encourage physical activity, such as performance of self-care at highest level of ability, strength and balance exercise program, and provision of appropriate assistive devices; refer to rehabilitation therapy.   Refer to community-based fall prevention program where available.   If fall occurs, determine the cause and revise fall injury prevention plan.   Regularly review medication contribution to fall risk; consider risk related to polypharmacy and age.   Refer to pharmacist for consultation when concerns about medications are revealed.    Balance adequate pain management with potential for oversedation.   Provide guidance related to environmental modifications.   Consider supplementation with Vitamin D.   Notes:     . Follow up with Primary Care Provider    . General - Client will not be readmitted within 30 days (C-SNP)    . General - Client will not be readmitted within 30 days (C-SNP)      Depression Screen PHQ 2/9 Scores 11/30/2020 11/29/2019 07/31/2019 06/12/2018 06/05/2017  PHQ - 2 Score 0 0 0 0 0    Fall Risk Fall Risk  11/30/2020 11/29/2019 07/31/2019 06/12/2018 06/05/2017  Falls in the past year? 1 0 1 No Yes  Number falls in past yr: 0 - 0 - 1  Injury with Fall? 0 - 0 - Yes  Risk for fall due to : History of fall(s);Impaired balance/gait;Impaired mobility - Impaired balance/gait;Impaired mobility - -  Follow up Falls evaluation completed - - - -    FALL RISK PREVENTION PERTAINING TO THE HOME:  Any stairs in or around the home? no If so, are there any without handrails? n/a Home free of loose throw rugs in walkways, pet beds, electrical cords, etc? yes  Adequate lighting in your home to reduce risk of falls?yes  ASSISTIVE DEVICES UTILIZED TO PREVENT FALLS:  Life alert?no  Use of a cane, walker or w/c? Wheelchair Grab bars in the bathroom? Yes  Shower chair or bench in shower? Yes Elevated toilet seat or a handicapped toilet?yes  TIMED UP AND GO:  Was the test performed? no patient nonambulatory .  Length of time to ambulate 10 feet:    Cognitive Function: MMSE - Mini Mental State Exam 11/30/2020  Not completed: Unable to complete     6CIT Screen 06/12/2018 06/05/2017  What Year? 4 points 4 points  What month? 3 points 3 points  What time? 3 points 3 points  Count back from 20 4 points 4 points  Months in reverse 4 points 4 points  Repeat phrase 10 points 10 points  Total Score 28 28    Immunizations Immunization History  Administered Date(s) Administered  . Influenza-Unspecified  06/09/2016, 08/01/2017, 06/07/2018, 06/10/2019  . Moderna SARS-COV2 Booster Vaccination 07/09/2020  . Moderna Sars-Covid-2 Vaccination 09/11/2019, 10/09/2019  . Pneumococcal Conjugate-13 08/01/2017  . Pneumococcal Polysaccharide-23 06/13/2016  . Tdap 08/05/2014, 05/18/2017   Vaccines per facility  Screening Tests Health Maintenance  Topic Date Due  . INFLUENZA VACCINE  04/05/2020  . COVID-19 Vaccine (4 - Booster for Moderna series) 01/06/2021  . TETANUS/TDAP  05/19/2027  . DEXA SCAN  Completed  . PNA vac Low Risk Adult  Completed  . HPV VACCINES  Aged Out    Health Maintenance  Health Maintenance Due  Topic Date Due  . INFLUENZA VACCINE  04/05/2020     Lung Cancer Screening: (Low Dose CT Chest recommended if Age 68-80 years, 30 pack-year currently smoking OR have quit w/in 15years.) does not qualify.   Lung Cancer Screening Referral:no   Additional Screening:  Hepatitis C Screening: does not  qualify; Completed   Vision Screening: Recommended annual ophthalmology exams for early detection of glaucoma and other disorders of the eye. Is the patient up to date with their annual eye exam?  yes  Who is the provider or what is the name of the office in which the patient attends annual eye exams? Per facility  If pt is not established with a provider, would they like to be referred to a provider to establish care?n/a  Dental Screening: Recommended annual dental exams for proper oral hygiene  Community Resource Referral / Chronic Care Management: CRR required this visit? no  CCM required this visit? no     Plan:     I have personally reviewed and noted the following in the patient's chart:   . Medical and social history . Use of alcohol, tobacco or illicit drugs  . Current medications and supplements . Functional ability and status . Nutritional status . Physical activity . Advanced directives . List of other physicians . Hospitalizations, surgeries, and ER  visits in previous 12 months . Vitals . Screenings to include cognitive, depression, and falls . Referrals and appointments  In addition, I have reviewed and discussed with patient certain preventive protocols, quality metrics, and best practice recommendations. A written personalized care plan for preventive services as well as general preventive health recommendations were provided to patient.     Gerlene Fee, NP   11/30/2020

## 2020-11-30 NOTE — Patient Instructions (Signed)
  Alicia Guerra , Thank you for taking time to come for your Medicare Wellness Visit. I appreciate your ongoing commitment to your health goals. Please review the following plan we discussed and let me know if I can assist you in the future.   These are the goals we discussed: Goals    . Absence of Fall and Fall-Related Injury     Evidence-based guidance:   Assess fall risk using a validated tool when available. Consider balance and gait impairment, muscle weakness, diminished vision or hearing, environmental hazards, presence of urinary or bowel urgency and/or incontinence.   Communicate fall injury risk to interprofessional healthcare team.   Develop a fall prevention plan with the patient and family.   Promote use of personal vision and auditory aids.   Promote reorientation, appropriate sensory stimulation, and routines to decrease risk of fall when changes in mental status are present.   Assess assistance level required for safe and effective self-care; consider referral for home care.   Encourage physical activity, such as performance of self-care at highest level of ability, strength and balance exercise program, and provision of appropriate assistive devices; refer to rehabilitation therapy.   Refer to community-based fall prevention program where available.   If fall occurs, determine the cause and revise fall injury prevention plan.   Regularly review medication contribution to fall risk; consider risk related to polypharmacy and age.   Refer to pharmacist for consultation when concerns about medications are revealed.   Balance adequate pain management with potential for oversedation.   Provide guidance related to environmental modifications.   Consider supplementation with Vitamin D.   Notes:     . Follow up with Primary Care Provider    . General - Client will not be readmitted within 30 days (C-SNP)    . General - Client will not be readmitted within 30 days (C-SNP)        This is a list of the screening recommended for you and due dates:  Health Maintenance  Topic Date Due  . Flu Shot  04/05/2020  . COVID-19 Vaccine (4 - Booster for Moderna series) 01/06/2021  . Tetanus Vaccine  05/19/2027  . DEXA scan (bone density measurement)  Completed  . Pneumonia vaccines  Completed  . HPV Vaccine  Aged Out

## 2020-12-23 ENCOUNTER — Encounter: Payer: Self-pay | Admitting: Adult Health

## 2020-12-23 ENCOUNTER — Non-Acute Institutional Stay (SKILLED_NURSING_FACILITY): Payer: Medicare HMO | Admitting: Adult Health

## 2020-12-23 DIAGNOSIS — I129 Hypertensive chronic kidney disease with stage 1 through stage 4 chronic kidney disease, or unspecified chronic kidney disease: Secondary | ICD-10-CM

## 2020-12-23 DIAGNOSIS — N184 Chronic kidney disease, stage 4 (severe): Secondary | ICD-10-CM

## 2020-12-23 DIAGNOSIS — I700 Atherosclerosis of aorta: Secondary | ICD-10-CM

## 2020-12-23 NOTE — Progress Notes (Signed)
Location:  [place] Room Number: 846 Place of Service:  SNF (31)   CODE STATUS: dnr  No Known Allergies  Chief Complaint  Patient presents with  . Medical Management of Chronic Issues         Aortic atherosclerosis    CKD (chronic kidney disease) stage IV:    Benign hypertension with CKD (chronic kidney disease) stage IV    HPI:  Sh is a 87 year old long term resident of this facility being seen for the management of her chronic illnesses:Aortic atherosclerosis    CKD (chronic kidney disease) stage IV:    Benign hypertension with CKD (chronic kidney disease) stage IV. There are no reports of uncontrolled pain; she continues to go out in facility daily; no reports of anxiety.    Past Medical History:  Diagnosis Date  . Anemia   . Arthritis   . History of blood transfusion    "today is the first time" (08/05/2014)  . Hypertension   . Left femoral shaft fracture ([place]) 08/08/2014    Past Surgical History:  Procedure Laterality Date  . APPENDECTOMY    . CATARACT EXTRACTION Left   . CHOLECYSTECTOMY    . COLONOSCOPY N/A 09/26/2014   Procedure: COLONOSCOPY;  Surgeon: [name], MD;  [facility];  Service: Endoscopy;  Laterality: N/A;  1030am  . CYSTECTOMY     "had cyst taken off lower back"  . ESOPHAGOGASTRODUODENOSCOPY N/A 08/22/2014   NGE:[id number] ring at the gastro junction/small HH  . ORIF FEMUR FRACTURE Left 08/07/2014   Procedure: OPEN REDUCTION INTERNAL FIXATION (ORIF)  LEFT FEMUR FRACTURE;  Surgeon: [name], MD;  Location: [place];  Service: Orthopedics;  Laterality: Left;  . TONSILLECTOMY      Social History   Socioeconomic History  . Marital status: Widowed    Spouse name: Not on file  . Number of children: Not on file  . Years of education: Not on file  . Highest education level: Not on file  Occupational History  . Occupation: retired   Tobacco Use  . Smoking status: Former Smoker    Packs/day: 0.25     Years: 16.00    Pack years: 4.00    Types: Cigarettes  . Smokeless tobacco: Never Used  Vaping Use  . Vaping Use: Never used  Substance and Sexual Activity  . Alcohol use: No  . Drug use: No  . Sexual activity: Never  Other Topics Concern  . Not on file  Social History Narrative   [facility] resident of [facility]    Social Determinants of Health   Financial Resource Strain: Not on file  Food Insecurity: Not on file  Transportation Needs: Not on file  Physical Activity: Not on file  Stress: Not on file  Social Connections: Not on file  Intimate Partner Violence: Not on file   Family History  Family history unknown: Yes      VITAL SIGNS BP (!) 151/74   Pulse 68   Temp (!) 97.4 F (36.3 C)   Resp 18   Ht 4\' 11"  (1.499 m)   Wt 86 lb 6.4 oz (39.2 kg)   BMI 17.45 kg/m   Outpatient Encounter Medications as of 12/23/2020  Medication Sig  . acetaminophen (TYLENOL) 325 MG tablet Take 650 mg by mouth every 6 (six) hours as needed.  Marland Kitchen amLODipine (NORVASC) 5 MG tablet Take 5 mg by mouth daily.   . ferrous sulfate 325 (65 FE)  MG tablet Take 325 mg by mouth daily with breakfast.   . metoprolol (LOPRESSOR) 50 MG tablet Take 1 tablet (50 mg total) by mouth 2 (two) times daily.  . NON FORMULARY Diet Change: Dysphagia 1 (puree), continue thin liquids  . NON FORMULARY Wanderguard for safety awareness. Check placement and function qshift. Alert tag number [id number]. Every Shift Day, Evening, Night  . Polyethyl Glycol-Propyl Glycol (SYSTANE) 0.4-0.3 % GEL ophthalmic gel Place 1 application into both eyes 2 (two) times daily at 10 AM and 5 PM.  . sodium bicarbonate 650 MG tablet Take 650 mg by mouth 2 (two) times daily.   No facility-administered encounter medications on file as of 12/23/2020.     SIGNIFICANT DIAGNOSTIC EXAMS   NO RECENT EXAMS.    LABS REVIEWED PREVIOUS;   12-05-19: vit D 22.50 01-30-20: wbc 6.3; hgb 10.8; hct 35.1; mcv 86.0 plt 284; glucose 95; bun 52; creat 2.04; k+  3.9; na++ 136; ca 9.3 liver normal albumin 4.0 tsh 2.055  07-29-20: wbc 5.3; hgb 11.2; hct 36.4; mcv 87.3 plt 269; glucose 103; bun 48; creat 2.11; k+ 4.2; na++ 139; ca 9.7 liver normal albumin 3.9 tsh 1.674 vit D 124.76   NO NEW LABS.   Review of Systems  Unable to perform ROS: Dementia (unable to participate )    Physical Exam Constitutional:      General: She is not in acute distress.    Appearance: She is underweight. She is not diaphoretic.  Neck:     Thyroid: No thyromegaly.  Cardiovascular:     Rate and Rhythm: Normal rate and regular rhythm.     Heart sounds: Normal heart sounds.  Pulmonary:     Effort: Pulmonary effort is normal. No respiratory distress.     Breath sounds: Normal breath sounds.  Abdominal:     General: Bowel sounds are normal. There is no distension.     Palpations: Abdomen is soft.     Tenderness: There is no abdominal tenderness.  Musculoskeletal:     Right lower leg: No edema.     Left lower leg: No edema.     Comments:  Is able to move all extremities  Uses wheelchair  History of left femur ORIF   Lymphadenopathy:     Cervical: No cervical adenopathy.  Skin:    General: Skin is warm and dry.  Neurological:     Mental Status: She is alert. Mental status is at baseline.  Psychiatric:        Mood and Affect: Mood normal.     ASSESSMENT/ PLAN:  TODAY:   1. Aortic atherosclerosis (ct 05-25-19); will monitor   2. CKD (chronic kidney disease) stage IV: bun 48; creat 2.11 will continue sodium bicarbonate 650 mg twice daily   3. Benign hypertension with CKD (chronic kidney disease) stage IV: is stable b/p 151/74 will continue lopressor 25 mg twice daily norvasc 5 mg daily   PREVIOUS   4. Dementia without behavioral disturbance unspecified dementia type: is without change weight is 86.4 pounds; continues to slowly lose weight. Unfortunately weight loss is an expected outcome at the late stages of this disease process.   5. Chronic idiopathic  constipation: is stable off medications will monitor   6. Unspecified iron deficiency anemia: is stable hgb 11.2 will continue iron daily   7. Vitamin D deficiency: is stable level 124.76 will monitor  8. History of colon cancer will monitor  9. Left femoral shaft fracture with history of left femur ORIF  is stable will monitor   10. Failure to thrive in adult: her weight is 86.4 pounds will continue to monitor her status; supplements as directed.     Will check cbc; cmp vit D; tsh   Ok Edwards NP [facility] Adult Medicine  Contact [phone] Monday through Friday 8am- 5pm  After hours call [phone]

## 2020-12-24 ENCOUNTER — Other Ambulatory Visit (HOSPITAL_COMMUNITY)
Admission: RE | Admit: 2020-12-24 | Discharge: 2020-12-24 | Disposition: A | Discharge status: Home / Self Care | Payer: Medicare HMO | Source: Skilled Nursing Facility | Attending: Adult Health | Admitting: Adult Health

## 2020-12-24 DIAGNOSIS — I129 Hypertensive chronic kidney disease with stage 1 through stage 4 chronic kidney disease, or unspecified chronic kidney disease: Secondary | ICD-10-CM | POA: Insufficient documentation

## 2020-12-24 LAB — CBC
HCT: 36.1 % (ref 36.0–46.0)
Hemoglobin: 11 g/dL — ABNORMAL LOW (ref 12.0–15.0)
MCH: 26.6 pg (ref 26.0–34.0)
MCHC: 30.5 g/dL (ref 30.0–36.0)
MCV: 87.4 fL (ref 80.0–100.0)
Platelets: 295 10*3/uL (ref 150–400)
RBC: 4.13 MIL/uL (ref 3.87–5.11)
RDW: 13.5 % (ref 11.5–15.5)
WBC: 5.8 10*3/uL (ref 4.0–10.5)
nRBC: 0 % (ref 0.0–0.2)

## 2020-12-24 LAB — COMPREHENSIVE METABOLIC PANEL
ALT: 12 U/L (ref 0–44)
AST: 15 U/L (ref 15–41)
Albumin: 3.7 g/dL (ref 3.5–5.0)
Alkaline Phosphatase: 60 U/L (ref 38–126)
Anion gap: 8 (ref 5–15)
BUN: 43 mg/dL — ABNORMAL HIGH (ref 8–23)
CO2: 27 mmol/L (ref 22–32)
Calcium: 8.8 mg/dL — ABNORMAL LOW (ref 8.9–10.3)
Chloride: 101 mmol/L (ref 98–111)
Creatinine, Ser: 2.11 mg/dL — ABNORMAL HIGH (ref 0.44–1.00)
GFR, Estimated: 21 mL/min — ABNORMAL LOW (ref >60)
Glucose, Bld: 90 mg/dL (ref 70–99)
Potassium: 3.7 mmol/L (ref 3.5–5.1)
Sodium: 136 mmol/L (ref 135–145)
Total Bilirubin: 0.4 mg/dL (ref 0.3–1.2)
Total Protein: 6.6 g/dL (ref 6.5–8.1)

## 2020-12-24 LAB — TSH: TSH: 2.216 u[IU]/mL (ref 0.350–4.500)

## 2020-12-24 LAB — VITAMIN D 25 HYDROXY (VIT D DEFICIENCY, FRACTURES): Vit D, 25-Hydroxy: 44.35 ng/mL (ref 30–100)

## 2020-12-29 DIAGNOSIS — M2141 Flat foot [pes planus] (acquired), right foot: Secondary | ICD-10-CM | POA: Diagnosis not present

## 2020-12-29 DIAGNOSIS — M2142 Flat foot [pes planus] (acquired), left foot: Secondary | ICD-10-CM | POA: Diagnosis not present

## 2020-12-29 DIAGNOSIS — B351 Tinea unguium: Secondary | ICD-10-CM | POA: Diagnosis not present

## 2020-12-29 DIAGNOSIS — I739 Peripheral vascular disease, unspecified: Secondary | ICD-10-CM | POA: Diagnosis not present

## 2021-01-29 ENCOUNTER — Non-Acute Institutional Stay (SKILLED_NURSING_FACILITY): Payer: Medicare HMO | Admitting: Adult Health

## 2021-01-29 ENCOUNTER — Encounter: Payer: Self-pay | Admitting: Adult Health

## 2021-01-29 DIAGNOSIS — K5904 Chronic idiopathic constipation: Secondary | ICD-10-CM

## 2021-01-29 DIAGNOSIS — D649 Anemia, unspecified: Secondary | ICD-10-CM

## 2021-01-29 DIAGNOSIS — F0390 Unspecified dementia without behavioral disturbance: Secondary | ICD-10-CM

## 2021-01-29 NOTE — Progress Notes (Signed)
Location:  [place] Room Number: 113-D Place of Service:  SNF (31)   CODE STATUS: DNR  No Known Allergies  Chief Complaint  Patient presents with  . Medical Management of Chronic Issues             Dementia without behavioral disturbance unspecified dementia type:    Chronic idiopathic constipation:    Unspecified iron deficiency anemia    HPI:  She is a 87 year old resident of this facility being seen for the management of her chronic illnesses: Dementia without behavioral disturbance unspecified dementia type:    Chronic idiopathic constipation:    Unspecified iron deficiency anemia. There are no reports of uncontrolled pain; no changes in appetite. No reports of anxiety.   Past Medical History:  Diagnosis Date  . Anemia   . Arthritis   . History of blood transfusion    "today is the first time" (08/05/2014)  . Hypertension   . Left femoral shaft fracture ([place]) 08/08/2014    Past Surgical History:  Procedure Laterality Date  . APPENDECTOMY    . CATARACT EXTRACTION Left   . CHOLECYSTECTOMY    . COLONOSCOPY N/A 09/26/2014   Procedure: COLONOSCOPY;  Surgeon: [name], MD;  [facility];  Service: Endoscopy;  Laterality: N/A;  1030am  . CYSTECTOMY     "had cyst taken off lower back"  . ESOPHAGOGASTRODUODENOSCOPY N/A 08/22/2014   [facility]:[id number] ring at the gastro junction/small HH  . ORIF FEMUR FRACTURE Left 08/07/2014   Procedure: OPEN REDUCTION INTERNAL FIXATION (ORIF)  LEFT FEMUR FRACTURE;  Surgeon: [name], MD;  Location: [place];  Service: Orthopedics;  Laterality: Left;  . TONSILLECTOMY      Social History   Socioeconomic History  . Marital status: Widowed    Spouse name: Not on file  . Number of children: Not on file  . Years of education: Not on file  . Highest education level: Not on file  Occupational History  . Occupation: retired   Tobacco Use  . Smoking status: Former Smoker    Packs/day: 0.25    Years:  16.00    Pack years: 4.00    Types: Cigarettes  . Smokeless tobacco: Never Used  Vaping Use  . Vaping Use: Never used  Substance and Sexual Activity  . Alcohol use: No  . Drug use: No  . Sexual activity: Never  Other Topics Concern  . Not on file  Social History Narrative   Long term resident of [facility]    Social Determinants of Health   Financial Resource Strain: Not on file  Food Insecurity: Not on file  Transportation Needs: Not on file  Physical Activity: Not on file  Stress: Not on file  Social Connections: Not on file  Intimate Partner Violence: Not on file   Family History  Family history unknown: Yes      VITAL SIGNS BP (!) 159/45   Pulse 60   Temp 98.1 F (36.7 C)   Resp 20   Ht 4\' 11"  (1.499 m)   Wt 86 lb 12.8 oz (39.4 kg)   SpO2 99%   BMI 17.53 kg/m   Outpatient Encounter Medications as of 01/29/2021  Medication Sig  . acetaminophen (TYLENOL) 325 MG tablet Take 650 mg by mouth every 6 (six) hours as needed.  Marland Kitchen amLODipine (NORVASC) 5 MG tablet Take 5 mg by mouth daily.   . ferrous sulfate 325 (65 FE) MG tablet Take 325 mg by  mouth daily with breakfast.   . metoprolol (LOPRESSOR) 50 MG tablet Take 1 tablet (50 mg total) by mouth 2 (two) times daily.  . NON FORMULARY Diet Change: Dysphagia 1 (puree), continue thin liquids  . NON FORMULARY Wanderguard for safety awareness. Check placement and function qshift. Alert tag number [id number]. Every Shift Day, Evening, Night  . Polyethyl Glycol-Propyl Glycol (SYSTANE) 0.4-0.3 % GEL ophthalmic gel Place 1 application into both eyes 2 (two) times daily at 10 AM and 5 PM.  . sodium bicarbonate 650 MG tablet Take 650 mg by mouth 2 (two) times daily.   No facility-administered encounter medications on file as of 01/29/2021.     SIGNIFICANT DIAGNOSTIC EXAMS   NO RECENT EXAMS.    LABS REVIEWED PREVIOUS;   01-30-20: wbc 6.3; hgb 10.8; hct 35.1; mcv 86.0 plt 284; glucose 95; bun 52; creat 2.04; k+ 3.9; na++ 136; ca 9.3  liver normal albumin 4.0 tsh 2.055  07-29-20: wbc 5.3; hgb 11.2; hct 36.4; mcv 87.3 plt 269; glucose 103; bun 48; creat 2.11; k+ 4.2; na++ 139; ca 9.7 liver normal albumin 3.9 tsh 1.674 vit D 124.76   TODAY  12-24-20: wbc 5.8; hgb 11.0; hct 36.1; mcv 87.4 plt 295; glucose 90; bun 43; creat 2.11; k+ 3.7; na++ 136; ca 8.8 GFR 21; liver normal albumin 3.7 tsh 2.216 vit D 44.35  Review of Systems  Unable to perform ROS: Dementia (unable to participate )    Physical Exam Constitutional:      General: She is not in acute distress.    Appearance: She is well-developed. She is not diaphoretic.  Neck:     Thyroid: No thyromegaly.  Cardiovascular:     Rate and Rhythm: Normal rate and regular rhythm.     Pulses: Normal pulses.     Heart sounds: Normal heart sounds.  Pulmonary:     Effort: Pulmonary effort is normal. No respiratory distress.     Breath sounds: Normal breath sounds.  Abdominal:     General: Bowel sounds are normal. There is no distension.     Palpations: Abdomen is soft.     Tenderness: There is no abdominal tenderness.  Musculoskeletal:     Cervical back: Neck supple.     Right lower leg: No edema.     Left lower leg: No edema.     Comments: Is able to move all extremities  Uses wheelchair  History of left femur ORIF    Lymphadenopathy:     Cervical: No cervical adenopathy.  Skin:    General: Skin is warm and dry.  Neurological:     Mental Status: She is alert. Mental status is at baseline.  Psychiatric:        Mood and Affect: Mood normal.      ASSESSMENT/ PLAN:  TODAY:   1. Dementia without behavioral disturbance unspecified dementia type: is without change weight is 98 pounds; is presently stable   2. Chronic idiopathic constipation: is stable off medications will monitor  3. Unspecified iron deficiency anemia: is stable hgb 11.0 will continue iron daily   PREVIOUS   4. Vitamin D deficiency: is stable level 44.35 will monitor  5. History of colon  cancer will monitor  6. Left femoral shaft fracture with history of left femur ORIF is stable will monitor   7. Failure to thrive in adult: her weight is 86 pounds will continue to monitor her status; supplements as directed.   8. Aortic atherosclerosis (ct 05-25-19); will monitor  9. CKD (chronic kidney disease) stage IV: bun 43; creat 2.11 will continue sodium bicarbonate 650 mg twice daily   10. Benign hypertension with CKD (chronic kidney disease) stage IV: is stable b/p 159/45 will continue lopressor 25 mg twice daily norvasc 5 mg daily    Ok Edwards NP [facility]  Contact [phone] Monday through Friday 8am- 5pm  After hours call [phone]

## 2021-02-11 ENCOUNTER — Encounter: Payer: Self-pay | Admitting: Adult Health

## 2021-02-11 ENCOUNTER — Non-Acute Institutional Stay (SKILLED_NURSING_FACILITY): Payer: Medicare HMO | Admitting: Adult Health

## 2021-02-11 DIAGNOSIS — I700 Atherosclerosis of aorta: Secondary | ICD-10-CM | POA: Diagnosis not present

## 2021-02-11 DIAGNOSIS — N184 Chronic kidney disease, stage 4 (severe): Secondary | ICD-10-CM | POA: Diagnosis not present

## 2021-02-11 DIAGNOSIS — F0390 Unspecified dementia without behavioral disturbance: Secondary | ICD-10-CM | POA: Diagnosis not present

## 2021-02-11 DIAGNOSIS — I129 Hypertensive chronic kidney disease with stage 1 through stage 4 chronic kidney disease, or unspecified chronic kidney disease: Secondary | ICD-10-CM | POA: Diagnosis not present

## 2021-02-11 NOTE — Progress Notes (Signed)
Location:  [place] Room Number: 113-D Place of Service:  SNF (31)   CODE STATUS: DNR  No Known Allergies  Chief Complaint  Patient presents with   Acute Visit    Care plan meeting.    HPI:  We have come together for her care plan meeting.  no BIMS; mood 4/30: decreased interest; not sleeping well; trouble concentrating; short tempered. She is limited to extensive assist with her adls. She is non ambulatory. She is assisted with meals. There have been no falls. Therapy none at this time . Dietary: is 85.6 pounds; continues to lose weight 3 pounds loss in 3 months. Is on a pureed diet.    she continues to be followed for her chronic illnesses including: Aortic atherosclerosis  Dementia without behavioral disturbance unspecified dementia type Benign hypertension with CKD (chronic kidney disease) stage IV  Past Medical History:  Diagnosis Date   Anemia    Arthritis    History of blood transfusion    "today is the first time" (08/05/2014)   Hypertension    Left femoral shaft fracture ([place]) 08/08/2014    Past Surgical History:  Procedure Laterality Date   APPENDECTOMY     CATARACT EXTRACTION Left    CHOLECYSTECTOMY     COLONOSCOPY N/A 09/26/2014   Procedure: COLONOSCOPY;  Surgeon: [name], MD;  [facility];  Service: Endoscopy;  Laterality: N/A;  1030am   CYSTECTOMY     "had cyst taken off lower back"   ESOPHAGOGASTRODUODENOSCOPY N/A 08/22/2014   WUJ:[id number] ring at the gastro junction/small HH   ORIF FEMUR FRACTURE Left 08/07/2014   Procedure: OPEN REDUCTION INTERNAL FIXATION (ORIF)  LEFT FEMUR FRACTURE;  Surgeon: [name], MD;  Location: [place];  Service: Orthopedics;  Laterality: Left;   TONSILLECTOMY      Social History   Socioeconomic History   Marital status: Widowed    Spouse name: Not on file   Number of children: Not on file   Years of education: Not on file   Highest education level: Not on file  Occupational  History   Occupation: retired   Tobacco Use   Smoking status: Former    Packs/day: 0.25    Years: 16.00    Pack years: 4.00    Types: Cigarettes   Smokeless tobacco: Never  Vaping Use   Vaping Use: Never used  Substance and Sexual Activity   Alcohol use: No   Drug use: No   Sexual activity: Never  Other Topics Concern   Not on file  Social History Narrative   Long term resident of [facility]    Social Determinants of Health   Financial Resource Strain: Not on file  Food Insecurity: Not on file  Transportation Needs: Not on file  Physical Activity: Not on file  Stress: Not on file  Social Connections: Not on file  Intimate Partner Violence: Not on file   Family History  Family history unknown: Yes      VITAL SIGNS BP (!) 170/46   Pulse 62   Temp 98.1 F (36.7 C)   Resp 16   Ht 4\' 11"  (1.499 m)   Wt 85 lb 9.6 oz (38.8 kg)   BMI 17.29 kg/m   Outpatient Encounter Medications as of 02/11/2021  Medication Sig   acetaminophen (TYLENOL) 325 MG tablet Take 650 mg by mouth every 6 (six) hours as needed.   amLODipine (NORVASC) 5 MG tablet Take 5 mg by mouth daily.  ferrous sulfate 325 (65 FE) MG tablet Take 325 mg by mouth daily with breakfast.    metoprolol (LOPRESSOR) 50 MG tablet Take 1 tablet (50 mg total) by mouth 2 (two) times daily.   NON FORMULARY Diet Change: Dysphagia 2 diet with ground meats.   NON FORMULARY Wanderguard for safety awareness. Check placement and function qshift. Alert tag number [id number]. Every Shift Day, Evening, Night   Polyethyl Glycol-Propyl Glycol (SYSTANE) 0.4-0.3 % GEL ophthalmic gel Place 1 application into both eyes 2 (two) times daily at 10 AM and 5 PM.   sodium bicarbonate 650 MG tablet Take 650 mg by mouth 2 (two) times daily.   No facility-administered encounter medications on file as of 02/11/2021.     SIGNIFICANT DIAGNOSTIC EXAMS   NO RECENT EXAMS.    LABS REVIEWED PREVIOUS;    07-29-20: wbc 5.3; hgb 11.2; hct 36.4; mcv 87.3  plt 269; glucose 103; bun 48; creat 2.11; k+ 4.2; na++ 139; ca 9.7 liver normal albumin 3.9 tsh 1.674 vit D 124.76  12-24-20: wbc 5.8; hgb 11.0; hct 36.1; mcv 87.4 plt 295; glucose 90; bun 43; creat 2.11; k+ 3.7; na++ 136; ca 8.8 GFR 21; liver normal albumin 3.7 tsh 2.216 vit D 44.35  NO NEW LABS.   Review of Systems  Unable to perform ROS: Dementia (unable to participate)   Physical Exam Constitutional:      General: She is not in acute distress.    Appearance: She is well-developed. She is not diaphoretic.  Neck:     Thyroid: No thyromegaly.  Cardiovascular:     Rate and Rhythm: Normal rate and regular rhythm.     Pulses: Normal pulses.     Heart sounds: Normal heart sounds.  Pulmonary:     Effort: Pulmonary effort is normal. No respiratory distress.     Breath sounds: Normal breath sounds.  Abdominal:     General: Bowel sounds are normal. There is no distension.     Palpations: Abdomen is soft.     Tenderness: There is no abdominal tenderness.  Musculoskeletal:     Cervical back: Neck supple.     Right lower leg: No edema.     Left lower leg: No edema.     Comments: Is able to move all extremities  Uses wheelchair  History of left femur ORIF     Lymphadenopathy:     Cervical: No cervical adenopathy.  Skin:    General: Skin is warm and dry.  Neurological:     Mental Status: She is alert. Mental status is at baseline.  Psychiatric:        Mood and Affect: Mood normal.     ASSESSMENT/ PLAN:  TODAY  Aortic atherosclerosis Dementia without behavioral disturbance unspecified dementia type Benign hypertension with CKD (chronic kidney disease) stage IV  Will continue current medications Will continue current plan of care Will continue to monitor her status.    Time spent with patient: 40 minutes: goals of care overall health status; dietary needs; medications.    Ok Edwards NP [facility]  Contact [phone] Monday through Friday 8am- 5pm   After hours call [phone]

## 2021-02-15 ENCOUNTER — Encounter (HOSPITAL_COMMUNITY)
Admission: RE | Admit: 2021-02-15 | Discharge: 2021-02-15 | Disposition: A | Discharge status: Home / Self Care | Payer: Medicare HMO | Source: Skilled Nursing Facility | Attending: Adult Health | Admitting: Adult Health

## 2021-02-15 DIAGNOSIS — I129 Hypertensive chronic kidney disease with stage 1 through stage 4 chronic kidney disease, or unspecified chronic kidney disease: Secondary | ICD-10-CM | POA: Diagnosis not present

## 2021-02-15 LAB — BASIC METABOLIC PANEL
Anion gap: 7 (ref 5–15)
BUN: 51 mg/dL — ABNORMAL HIGH (ref 8–23)
CO2: 27 mmol/L (ref 22–32)
Calcium: 9 mg/dL (ref 8.9–10.3)
Chloride: 104 mmol/L (ref 98–111)
Creatinine, Ser: 3.05 mg/dL — ABNORMAL HIGH (ref 0.44–1.00)
GFR, Estimated: 14 mL/min — ABNORMAL LOW (ref >60)
Glucose, Bld: 84 mg/dL (ref 70–99)
Potassium: 4.2 mmol/L (ref 3.5–5.1)
Sodium: 138 mmol/L (ref 135–145)

## 2021-02-15 LAB — CBC WITH DIFFERENTIAL/PLATELET
Abs Immature Granulocytes: 0.04 10*3/uL (ref 0.00–0.07)
Basophils Absolute: 0.1 10*3/uL (ref 0.0–0.1)
Basophils Relative: 1 %
Eosinophils Absolute: 0.1 10*3/uL (ref 0.0–0.5)
Eosinophils Relative: 1 %
HCT: 32.2 % — ABNORMAL LOW (ref 36.0–46.0)
Hemoglobin: 10 g/dL — ABNORMAL LOW (ref 12.0–15.0)
Immature Granulocytes: 1 %
Lymphocytes Relative: 34 %
Lymphs Abs: 1.9 10*3/uL (ref 0.7–4.0)
MCH: 26.5 pg (ref 26.0–34.0)
MCHC: 31.1 g/dL (ref 30.0–36.0)
MCV: 85.4 fL (ref 80.0–100.0)
Monocytes Absolute: 0.9 10*3/uL (ref 0.1–1.0)
Monocytes Relative: 16 %
Neutro Abs: 2.8 10*3/uL (ref 1.7–7.7)
Neutrophils Relative %: 47 %
Platelets: 273 10*3/uL (ref 150–400)
RBC: 3.77 MIL/uL — ABNORMAL LOW (ref 3.87–5.11)
RDW: 13.9 % (ref 11.5–15.5)
WBC: 5.8 10*3/uL (ref 4.0–10.5)
nRBC: 0 % (ref 0.0–0.2)

## 2021-03-03 ENCOUNTER — Encounter: Payer: Self-pay | Admitting: Adult Health

## 2021-03-03 ENCOUNTER — Non-Acute Institutional Stay (SKILLED_NURSING_FACILITY): Payer: Medicare HMO | Admitting: Adult Health

## 2021-03-03 DIAGNOSIS — Z1159 Encounter for screening for other viral diseases: Secondary | ICD-10-CM | POA: Diagnosis not present

## 2021-03-03 DIAGNOSIS — E559 Vitamin D deficiency, unspecified: Secondary | ICD-10-CM | POA: Diagnosis not present

## 2021-03-03 DIAGNOSIS — Z8781 Personal history of (healed) traumatic fracture: Secondary | ICD-10-CM

## 2021-03-03 DIAGNOSIS — I129 Hypertensive chronic kidney disease with stage 1 through stage 4 chronic kidney disease, or unspecified chronic kidney disease: Secondary | ICD-10-CM | POA: Diagnosis not present

## 2021-03-03 DIAGNOSIS — R627 Adult failure to thrive: Secondary | ICD-10-CM | POA: Diagnosis not present

## 2021-03-03 NOTE — Progress Notes (Signed)
Location:  [place] Room Number: 113 Place of Service:  SNF (31)   CODE STATUS: dnr  No Known Allergies  Chief Complaint  Patient presents with  . Medical Management of Chronic Issues        Vitamin D deficiency:     Left femoral shaft fracture  Failure to thrive in adult    HPI:  She is a 87 year old long term resident of this facility being seen for the management of her chronic illnesses: Vitamin D deficiency:     Left femoral shaft fracture  Failure to thrive in adult. There are no reports of uncontrolled pain; she is slowly losing weight which is an unfortunate but expected out come at the late stages of dementia.   Past Medical History:  Diagnosis Date  . Anemia   . Arthritis   . History of blood transfusion    "today is the first time" (08/05/2014)  . Hypertension   . Left femoral shaft fracture ([place]) 08/08/2014    Past Surgical History:  Procedure Laterality Date  . APPENDECTOMY    . CATARACT EXTRACTION Left   . CHOLECYSTECTOMY    . COLONOSCOPY N/A 09/26/2014   Procedure: COLONOSCOPY;  Surgeon: [name], MD;  [facility];  Service: Endoscopy;  Laterality: N/A;  1030am  . CYSTECTOMY     "had cyst taken off lower back"  . ESOPHAGOGASTRODUODENOSCOPY N/A 08/22/2014   [facility]:ALPFXTKW ring at the gastro junction/small HH  . ORIF FEMUR FRACTURE Left 08/07/2014   Procedure: OPEN REDUCTION INTERNAL FIXATION (ORIF)  LEFT FEMUR FRACTURE;  Surgeon: [name], MD;  Location: [place];  Service: Orthopedics;  Laterality: Left;  . TONSILLECTOMY      Social History   Socioeconomic History  . Marital status: Widowed    Spouse name: Not on file  . Number of children: Not on file  . Years of education: Not on file  . Highest education level: Not on file  Occupational History  . Occupation: retired   Tobacco Use  . Smoking status: Former    Packs/day: 0.25    Years: 16.00    Pack years: 4.00    Types: Cigarettes  . Smokeless  tobacco: Never  Vaping Use  . Vaping Use: Never used  Substance and Sexual Activity  . Alcohol use: No  . Drug use: No  . Sexual activity: Never  Other Topics Concern  . Not on file  Social History Narrative   Long term resident of [facility]    Social Determinants of Health   Financial Resource Strain: Not on file  Food Insecurity: Not on file  Transportation Needs: Not on file  Physical Activity: Not on file  Stress: Not on file  Social Connections: Not on file  Intimate Partner Violence: Not on file   Family History  Family history unknown: Yes      VITAL SIGNS BP 118/78   Pulse 67   Temp 98.2 F (36.8 C)   Resp 18   Ht 4\' 11"  (1.499 m)   Wt 85 lb 9.6 oz (38.8 kg)   BMI 17.29 kg/m   Outpatient Encounter Medications as of 03/03/2021  Medication Sig  . acetaminophen (TYLENOL) 325 MG tablet Take 650 mg by mouth every 6 (six) hours as needed.  Marland Kitchen amLODipine (NORVASC) 5 MG tablet Take 5 mg by mouth daily.   . ferrous sulfate 325 (65 FE) MG tablet Take 325 mg by mouth daily with breakfast.   .  metoprolol (LOPRESSOR) 50 MG tablet Take 1 tablet (50 mg total) by mouth 2 (two) times daily.  . NON FORMULARY Diet Change: Dysphagia 2 diet with ground meats.  Marland Kitchen NON FORMULARY Wanderguard for safety awareness. Check placement and function qshift. Alert tag number [id number]. Every Shift Day, Evening, Night  . Polyethyl Glycol-Propyl Glycol (SYSTANE) 0.4-0.3 % GEL ophthalmic gel Place 1 application into both eyes 2 (two) times daily at 10 AM and 5 PM.  . sodium bicarbonate 650 MG tablet Take 650 mg by mouth 2 (two) times daily.   No facility-administered encounter medications on file as of 03/03/2021.     SIGNIFICANT DIAGNOSTIC EXAMS   NO RECENT EXAMS.    LABS REVIEWED PREVIOUS;    07-29-20: wbc 5.3; hgb 11.2; hct 36.4; mcv 87.3 plt 269; glucose 103; bun 48; creat 2.11; k+ 4.2; na++ 139; ca 9.7 liver normal albumin 3.9 tsh 1.674 vit D 124.76  12-24-20: wbc 5.8; hgb 11.0; hct 36.1;  mcv 87.4 plt 295; glucose 90; bun 43; creat 2.11; k+ 3.7; na++ 136; ca 8.8 GFR 21; liver normal albumin 3.7 tsh 2.216 vit D 44.35  TODAY  02-15-21: wbc 5.8; hgb 10.0; hct 32.2; mcv 85.4 plt 273; glucose 84; bun 51; creat 3.05; k+ 4.2; na++ 138; ca 9.0; GFR 14    Review of Systems  Unable to perform ROS: Dementia (unable to participate)    Physical Exam Constitutional:      General: She is not in acute distress.    Appearance: She is underweight. She is not diaphoretic.  Neck:     Thyroid: No thyromegaly.  Cardiovascular:     Rate and Rhythm: Normal rate and regular rhythm.     Pulses: Normal pulses.     Heart sounds: Normal heart sounds.  Pulmonary:     Effort: Pulmonary effort is normal. No respiratory distress.     Breath sounds: Normal breath sounds.  Abdominal:     General: Bowel sounds are normal. There is no distension.     Palpations: Abdomen is soft.     Tenderness: There is no abdominal tenderness.  Musculoskeletal:     Cervical back: Neck supple.     Right lower leg: No edema.     Left lower leg: No edema.     Comments:  Is able to move all extremities Uses wheelchair History of left femur ORIF      Lymphadenopathy:     Cervical: No cervical adenopathy.  Skin:    General: Skin is warm and dry.  Neurological:     Mental Status: She is alert. Mental status is at baseline.  Psychiatric:        Mood and Affect: Mood normal.     ASSESSMENT/ PLAN:  TODAY:   Vitamin D deficiency: is stable is 44.35 will monitor   2. Left femoral shaft fracture with history of left femur ORIF is stable  3. Failure to thrive in adult: weight is 85 pounds; supplements as directed continues to slowly lose weight.   PREVIOUS   4. History of colon cancer will monitor  5. Aortic atherosclerosis (ct 05-25-19); will monitor   6. CKD (chronic kidney disease) stage IV: bun 51; creat 3.05 will continue sodium bicarbonate 650 mg twice daily   7. Benign hypertension with CKD (chronic  kidney disease) stage IV: is stable b/p 118/78 will continue lopressor 25 mg twice daily norvasc 5 mg daily   8. Dementia without behavioral disturbance unspecified dementia type: is without change weight is  85 pounds; is slowly losing weight    9. Chronic idiopathic constipation: is stable off medications will monitor  10. Unspecified iron deficiency anemia: is stable hgb 10.0 will continue iron daily      Ok Edwards NP [facility]  Contact [phone] Monday through Friday 8am- 5pm  After hours call [phone]

## 2021-03-09 DIAGNOSIS — Z1159 Encounter for screening for other viral diseases: Secondary | ICD-10-CM | POA: Diagnosis not present

## 2021-03-09 DIAGNOSIS — I129 Hypertensive chronic kidney disease with stage 1 through stage 4 chronic kidney disease, or unspecified chronic kidney disease: Secondary | ICD-10-CM | POA: Diagnosis not present

## 2021-03-11 DIAGNOSIS — I129 Hypertensive chronic kidney disease with stage 1 through stage 4 chronic kidney disease, or unspecified chronic kidney disease: Secondary | ICD-10-CM | POA: Diagnosis not present

## 2021-03-11 DIAGNOSIS — Z1159 Encounter for screening for other viral diseases: Secondary | ICD-10-CM | POA: Diagnosis not present

## 2021-03-16 DIAGNOSIS — I129 Hypertensive chronic kidney disease with stage 1 through stage 4 chronic kidney disease, or unspecified chronic kidney disease: Secondary | ICD-10-CM | POA: Diagnosis not present

## 2021-03-16 DIAGNOSIS — Z1159 Encounter for screening for other viral diseases: Secondary | ICD-10-CM | POA: Diagnosis not present

## 2021-03-18 DIAGNOSIS — I129 Hypertensive chronic kidney disease with stage 1 through stage 4 chronic kidney disease, or unspecified chronic kidney disease: Secondary | ICD-10-CM | POA: Diagnosis not present

## 2021-03-18 DIAGNOSIS — Z1159 Encounter for screening for other viral diseases: Secondary | ICD-10-CM | POA: Diagnosis not present

## 2021-03-23 DIAGNOSIS — I129 Hypertensive chronic kidney disease with stage 1 through stage 4 chronic kidney disease, or unspecified chronic kidney disease: Secondary | ICD-10-CM | POA: Diagnosis not present

## 2021-03-23 DIAGNOSIS — Z1159 Encounter for screening for other viral diseases: Secondary | ICD-10-CM | POA: Diagnosis not present

## 2021-03-25 DIAGNOSIS — Z1159 Encounter for screening for other viral diseases: Secondary | ICD-10-CM | POA: Diagnosis not present

## 2021-03-25 DIAGNOSIS — I129 Hypertensive chronic kidney disease with stage 1 through stage 4 chronic kidney disease, or unspecified chronic kidney disease: Secondary | ICD-10-CM | POA: Diagnosis not present

## 2021-03-26 ENCOUNTER — Non-Acute Institutional Stay (SKILLED_NURSING_FACILITY): Payer: Medicare HMO | Admitting: Internal Medicine

## 2021-03-26 ENCOUNTER — Encounter: Payer: Self-pay | Admitting: Internal Medicine

## 2021-03-26 DIAGNOSIS — N184 Chronic kidney disease, stage 4 (severe): Secondary | ICD-10-CM

## 2021-03-26 DIAGNOSIS — D649 Anemia, unspecified: Secondary | ICD-10-CM

## 2021-03-26 DIAGNOSIS — I129 Hypertensive chronic kidney disease with stage 1 through stage 4 chronic kidney disease, or unspecified chronic kidney disease: Secondary | ICD-10-CM | POA: Diagnosis not present

## 2021-03-26 DIAGNOSIS — F0390 Unspecified dementia without behavioral disturbance: Secondary | ICD-10-CM | POA: Diagnosis not present

## 2021-03-26 NOTE — Assessment & Plan Note (Signed)
BP controlled; no change in antihypertensive medications  

## 2021-03-26 NOTE — Progress Notes (Addendum)
    NURSING OME LOCATION:   [facility] ROOM NUMBER: [id number]    CODE STATUS: DNR    PCP:  Ok Edwards NP   This is a nursing facility follow up visit of chronic medical diagnoses & to document compliance with Regulation 483.30 (c) in [employer] [place] Manual Phase 2 which mandates caregiver visit ( visits can alternate among physician, PA or NP as per statutes) within 10 days of 30 days / 60 days/ 90 days post admission to SNF date     Interim medical record and care since last SNF visit was updated with review of diagnostic studies and change in clinical status since last visit were documented.   HPI: She is a permanent resident of the facility with medical diagnoses of chronic anemia, degenerative arthritis, aortic atherosclerosis, essential hypertension, vitamin D deficiency, adult failure to thrive, history of colon cancer, CKD stage IV, and dementia.   Review of systems: Could not be completed as she is completely nonverbal and cannot follow commands.   Physical exam: Pertinent or positive findings: She appears chronically ill and malnourished.  She appears to be edentulous but would not open her mouth enough for me to perform an adequate exam.  Heart sounds are markedly distant.  Breath sounds are also decreased.  Pedal pulses are decreased.  Limbs are atrophic.  She sits in the wheelchair with no purposeful activity during the exam.   General appearance: no acute distress, increased work of breathing is present.   Lymphatic: No lymphadenopathy about the head, neck, axilla. Eyes: No conjunctival inflammation or lid edema is present. There is no scleral icterus. Ears:  External ear exam shows no significant lesions or deformities.   Nose:  External nasal examination shows no deformity or inflammation. Nasal mucosa are pink and moist without lesions, exudates Neck:  No thyromegaly, masses, tenderness noted.    Heart:  No murmur, click, rub . Lungs: without  wheezes, rhonchi, rales, rubs. Abdomen: Bowel sounds are normal. Abdomen is soft and nontender with no organomegaly, hernias, masses. GU: Deferred Extremities:  No cyanosis, clubbing, edema Neurologic exam :Balance, Rhomberg, finger to nose testing could not be completed due to clinical state Skin: Warm & dry w/o tenting. No significant lesions or rash.   See summary under each active problem in the Problem List with associated updated therapeutic plan

## 2021-03-26 NOTE — Progress Notes (Deleted)
NURSING HOME LOCATION:   [facility] ROOM NUMBER: [id number]    CODE STATUS: DNR    PCP:  Ok Edwards NP   This is a nursing facility follow up visit of chronic medical diagnoses & to document compliance with Regulation 483.30 (c) in [employer] Manual Phase 2 which mandates caregiver visit ( visits can alternate among physician, PA or NP as per statutes) within 10 days of 30 days / 60 days/ 90 days post admission to SNF date     Interim medical record and care since last SNF visit was updated with review of diagnostic studies and change in clinical status since last visit were documented.   HPI: She is a permanent resident of the facility with medical diagnoses of chronic anemia, degenerative arthritis, aortic atherosclerosis, essential hypertension, vitamin D deficiency, adult failure to thrive, history of colon cancer, CKD stage IV, and dementia.   Review of systems: Could not be completed as she is completely nonverbal and cannot follow commands.   Physical exam: Pertinent or positive findings: She appears chronically ill and malnourished.  She appears to be a dentulous but would not open her mouth enough for me to perform an adequate exam.  Heart sounds are markedly distant.  Breath sounds are also decreased.  Pedal pulses are decreased.  Limbs are atrophic.  She sits in the wheelchair with no purposeful activity during the exam.   General appearance: Adequately nourished; no acute distress, increased work of breathing is present.   Lymphatic: No lymphadenopathy about the head, neck, axilla. Eyes: No conjunctival inflammation or lid edema is present. There is no scleral icterus. Ears:  External ear exam shows no significant lesions or deformities.   Nose:  External nasal examination shows no deformity or inflammation. Nasal mucosa are pink and moist without lesions, exudates Oral exam:  Lips and gums are healthy appearing. There is  no oropharyngeal erythema or exudate. Neck:  No thyromegaly, masses, tenderness noted.    Heart:  Normal rate and regular rhythm. S1 and S2 normal without gallop, murmur, click, rub . Lungs: Chest clear to auscultation without wheezes, rhonchi, rales, rubs. Abdomen: Bowel sounds are normal. Abdomen is soft and nontender with no organomegaly, hernias, masses. GU: Deferred Extremities:  No cyanosis, clubbing, edema Neurologic exam : Cn 2-7 intact Strength equal  in upper & lower extremities Balance, Rhomberg, finger to nose testing could not be completed due to clinical state Deep tendon reflexes are equal Skin: Warm & dry w/o tenting. No significant lesions or rash.   See summary under each active problem in the Problem List with associated updated therapeutic plan                     NURSING HOME LOCATION:   [facility] ROOM NUMBER: [id number]    CODE STATUS: DNR    PCP:  Ok Edwards NP   This is a nursing facility follow up visit of chronic medical diagnoses & to document compliance with Regulation 483.30 (c) in The [place] Manual Phase 2 which mandates caregiver visit ( visits can alternate among physician, PA or NP as per statutes) within 10 days of 30 days / 60 days/ 90 days post admission to SNF date     Interim medical record and care since last SNF visit was updated with review of diagnostic studies and change  in clinical status since last visit were documented.   HPI:   Review of systems: Dementia invalidated responses. Date given as   Constitutional: No fever, significant weight change, fatigue Eyes: No redness, discharge, pain, vision change ENT/mouth: No nasal congestion,  purulent discharge, earache, change in hearing, sore throat Cardiovascular: No chest pain, palpitations, paroxysmal nocturnal dyspnea, claudication, edema Respiratory: No cough, sputum production, hemoptysis, DOE, significant snoring, apnea   Gastrointestinal:  No heartburn, dysphagia, abdominal pain, nausea /vomiting, rectal bleeding, melena, change in bowels Genitourinary: No dysuria, hematuria, pyuria, incontinence, nocturia Musculoskeletal: No joint stiffness, joint swelling, weakness, pain Dermatologic: No rash, pruritus, change in appearance of skin Neurologic: No dizziness, headache, syncope, seizures, numbness, tingling Psychiatric: No significant anxiety, depression, insomnia, anorexia Endocrine: No change in hair/skin/nails, excessive thirst, excessive hunger, excessive urination Hematologic/lymphatic: No significant bruising, lymphadenopathy, abnormal bleeding Allergy/immunology: No itchy/watery eyes, significant sneezing, urticaria, angioedema   Physical exam: Pertinent or positive findings: General appearance: Adequately nourished; no acute distress, increased work of breathing is present.   Lymphatic: No lymphadenopathy about the head, neck, axilla. Eyes: No conjunctival inflammation or lid edema is present. There is no scleral icterus. Ears:  External ear exam shows no significant lesions or deformities.   Nose:  External nasal examination shows no deformity or inflammation. Nasal mucosa are pink and moist without lesions, exudates Oral exam:  Lips and gums are healthy appearing. There is no oropharyngeal erythema or exudate. Neck:  No thyromegaly, masses, tenderness noted.    Heart:  Normal rate and regular rhythm. S1 and S2 normal without gallop, murmur, click, rub . Lungs: Chest clear to auscultation without wheezes, rhonchi, rales, rubs. Abdomen: Bowel sounds are normal. Abdomen is soft and nontender with no organomegaly, hernias, masses. GU: Deferred Extremities:  No cyanosis, clubbing, edema Neurologic exam : Cn 2-7 intact Strength equal  in upper & lower extremities Balance, Rhomberg, finger to nose testing could not be completed due to clinical state Deep tendon reflexes are equal Skin: Warm & dry w/o tenting. No  significant lesions or rash.   See summary under each active problem in the Problem List with associated updated therapeutic plan

## 2021-03-26 NOTE — Progress Notes (Signed)
This encounter was created in error - please disregard.

## 2021-03-26 NOTE — Assessment & Plan Note (Addendum)
02/15/21 H/H 10/32.2 , down from 11/36.1 in context of ESRD No bleeding dyscrasias reported Update H/H

## 2021-03-26 NOTE — Progress Notes (Deleted)
   NURSING HOME LOCATION:   [facility] ROOM NUMBER: [id number]   CODE STATUS: DNR   PCP:  Ok Edwards NP  This is a nursing facility follow up visit of chronic medical diagnoses & to document compliance with Regulation 483.30 (c) in The [place] Manual Phase 2 which mandates caregiver visit ( visits can alternate among physician, PA or NP as per statutes) within 10 days of 30 days / 60 days/ 90 days post admission to SNF date    Interim medical record and care since last SNF visit was updated with review of diagnostic studies and change in clinical status since last visit were documented.  HPI: She is a permanent resident of the facility with medical diagnoses of chronic anemia, degenerative arthritis, aortic atherosclerosis, essential hypertension, vitamin D deficiency, adult failure to thrive, history of colon cancer, CKD stage IV, and dementia.  Review of systems: Could not be completed as she is completely nonverbal and cannot follow commands.  Physical exam:  Pertinent or positive findings: She appears chronically ill and malnourished.  She appears to be a dentulous but would not open her mouth enough for me to perform an adequate exam.  Heart sounds are markedly distant.  Breath sounds are also decreased.  Pedal pulses are decreased.  Limbs are atrophic.  She sits in the wheelchair with no purposeful activity during the exam.  General appearance: Adequately nourished; no acute distress, increased work of breathing is present.   Lymphatic: No lymphadenopathy about the head, neck, axilla. Eyes: No conjunctival inflammation or lid edema is present. There is no scleral icterus. Ears:  External ear exam shows no significant lesions or deformities.   Nose:  External nasal examination shows no deformity or inflammation. Nasal mucosa are pink and moist without lesions, exudates Oral exam:  Lips and gums are healthy appearing. There is no oropharyngeal erythema or  exudate. Neck:  No thyromegaly, masses, tenderness noted.    Heart:  Normal rate and regular rhythm. S1 and S2 normal without gallop, murmur, click, rub .  Lungs: Chest clear to auscultation without wheezes, rhonchi, rales, rubs. Abdomen: Bowel sounds are normal. Abdomen is soft and nontender with no organomegaly, hernias, masses. GU: Deferred  Extremities:  No cyanosis, clubbing, edema  Neurologic exam : Cn 2-7 intact Strength equal  in upper & lower extremities Balance, Rhomberg, finger to nose testing could not be completed due to clinical state Deep tendon reflexes are equal Skin: Warm & dry w/o tenting. No significant lesions or rash.  See summary under each active problem in the Problem List with associated updated therapeutic plan

## 2021-03-26 NOTE — Assessment & Plan Note (Addendum)
At today's exam she is completely nonverbal and cannot focus or follow commands.

## 2021-03-26 NOTE — Assessment & Plan Note (Signed)
02/15/21 creat 3.05/GFR 14 CKD Stage IV Medication List reviewed; no nephrotoxic agents identified.

## 2021-03-28 IMAGING — DX DG HIP (WITH OR WITHOUT PELVIS) 2-3V RIGHT
3 series · 3 of 3 positions shown · non-contrast
Comparison: 08/05/2014 pelvis

CLINICAL DATA: Pt was very confused and agitated during right hip
xray. Pt could not verbalize reason for exam. Felt no body medical
injector devices. Nurse notes:Pt brought in from [facility].
Unwitnessed fall . Pt fell when getting OOB.

EXAM:
DG HIP (WITH OR WITHOUT PELVIS) 2-3V RIGHT

[pelvis ap]
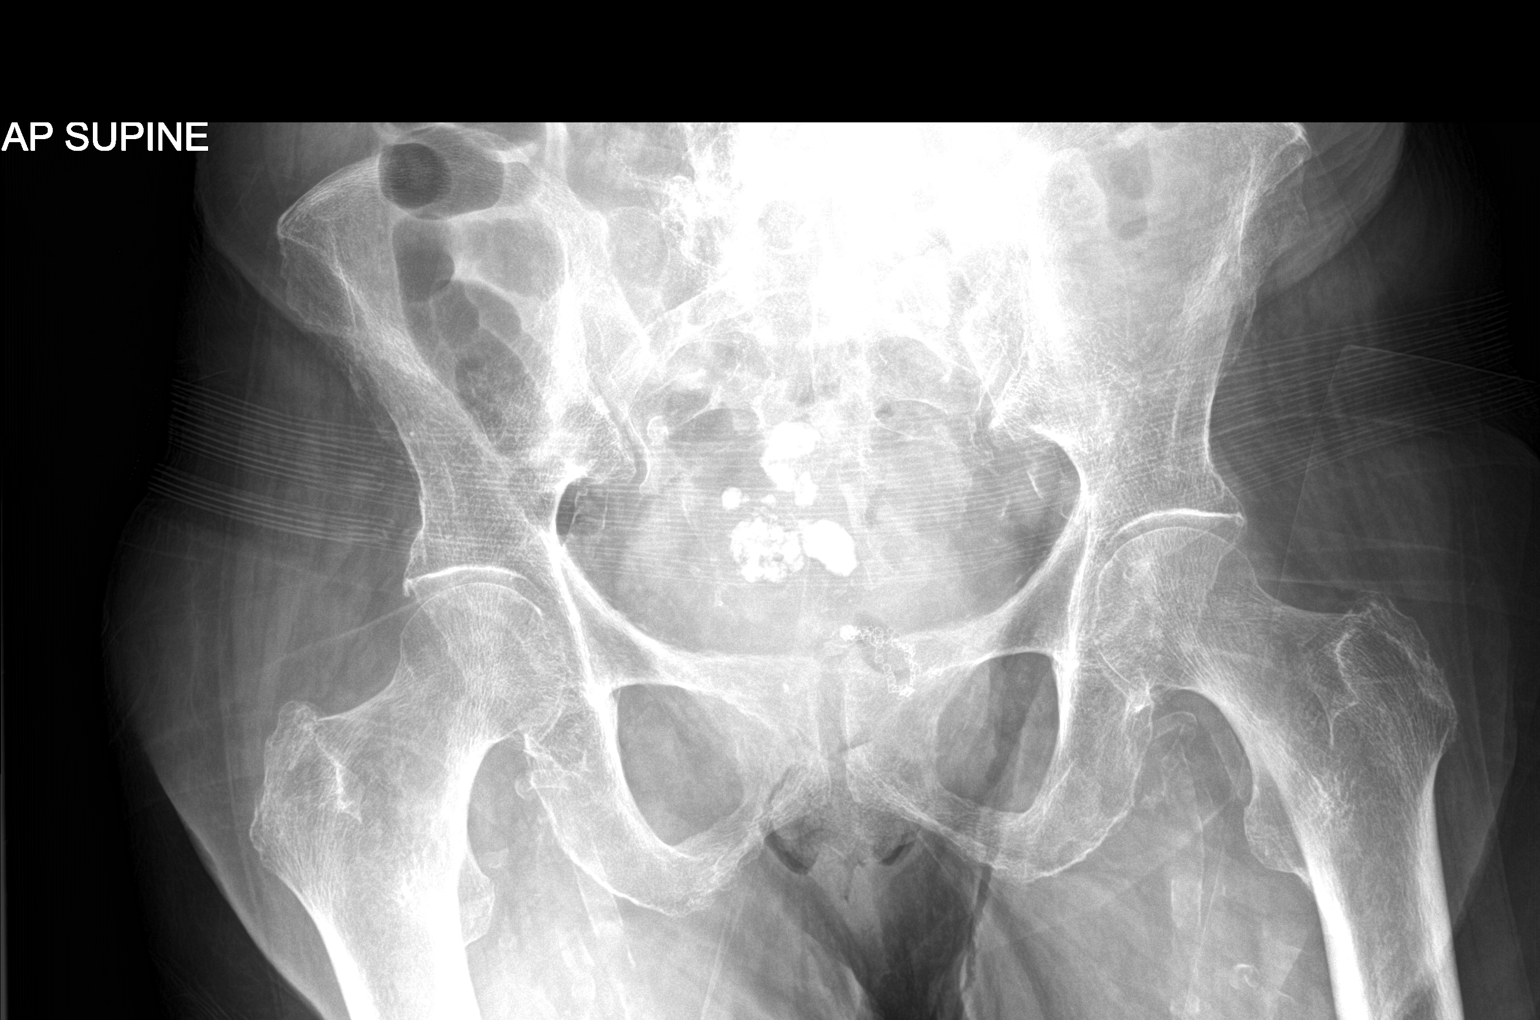

[hip ap]
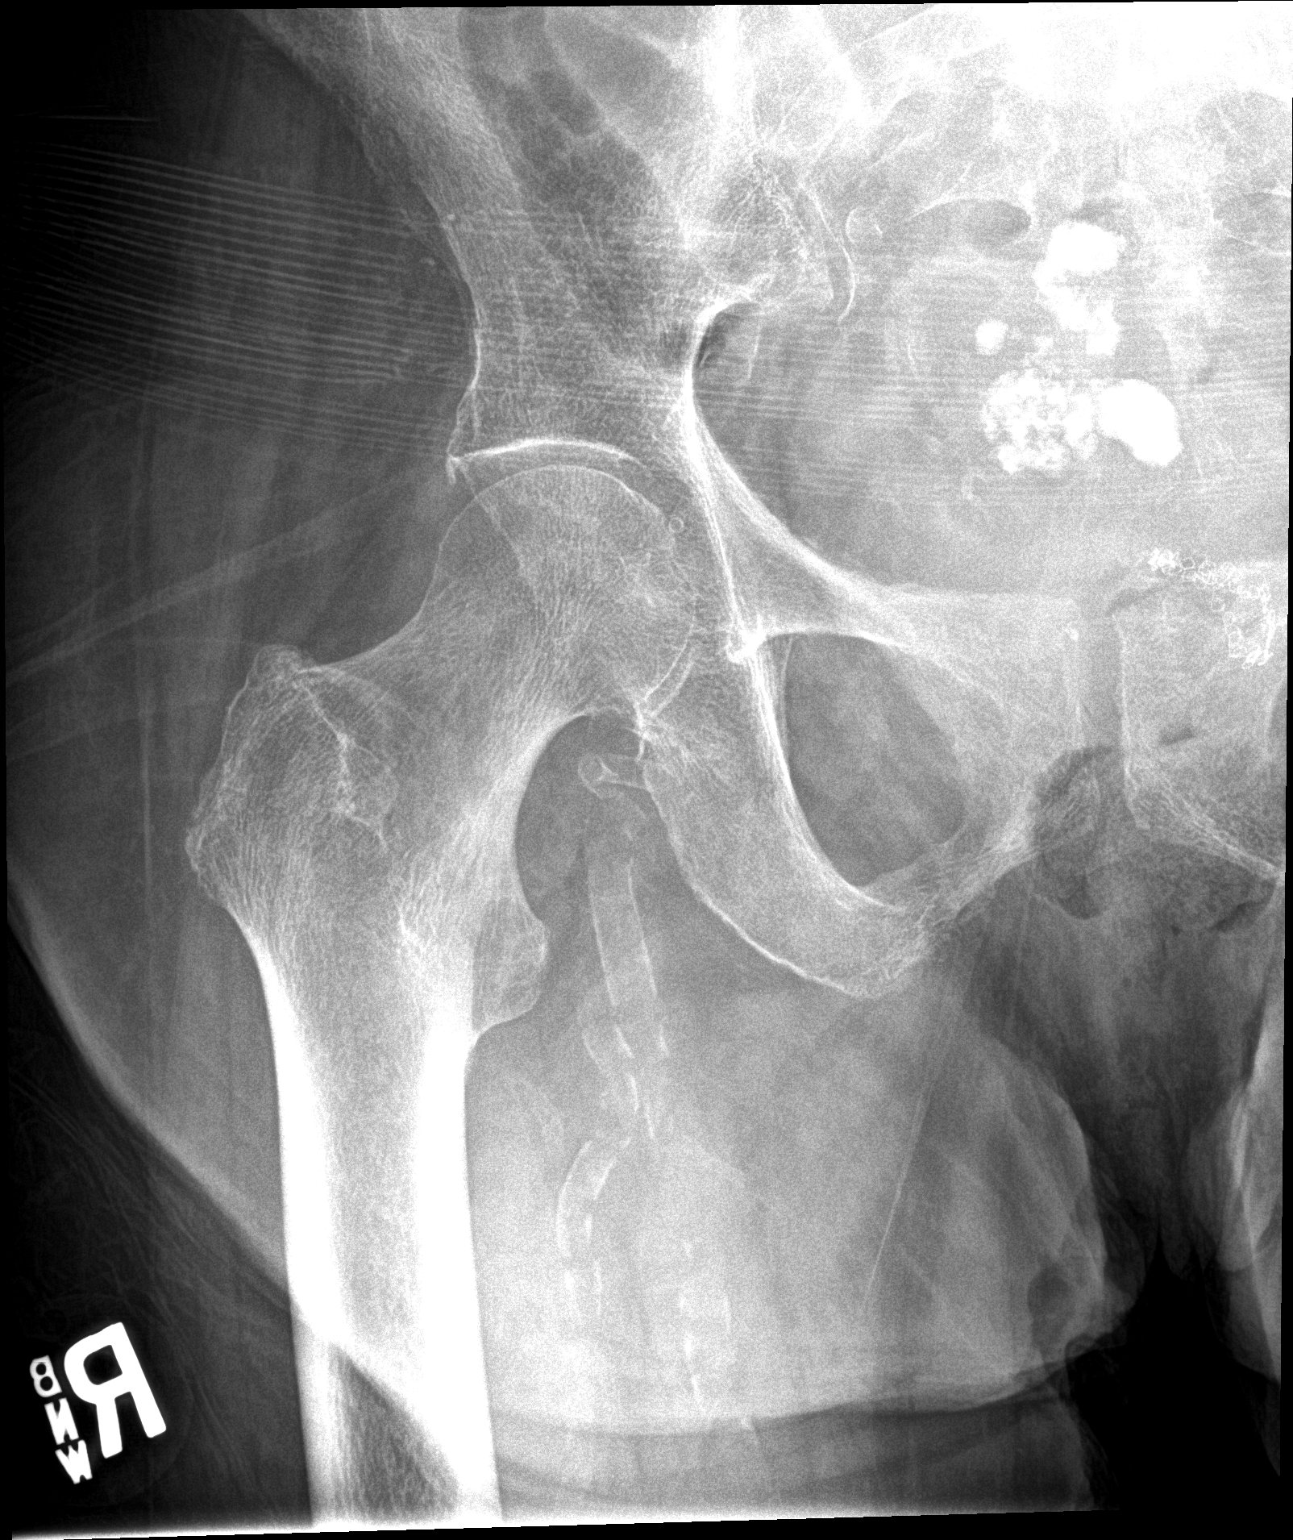

[hip lat]
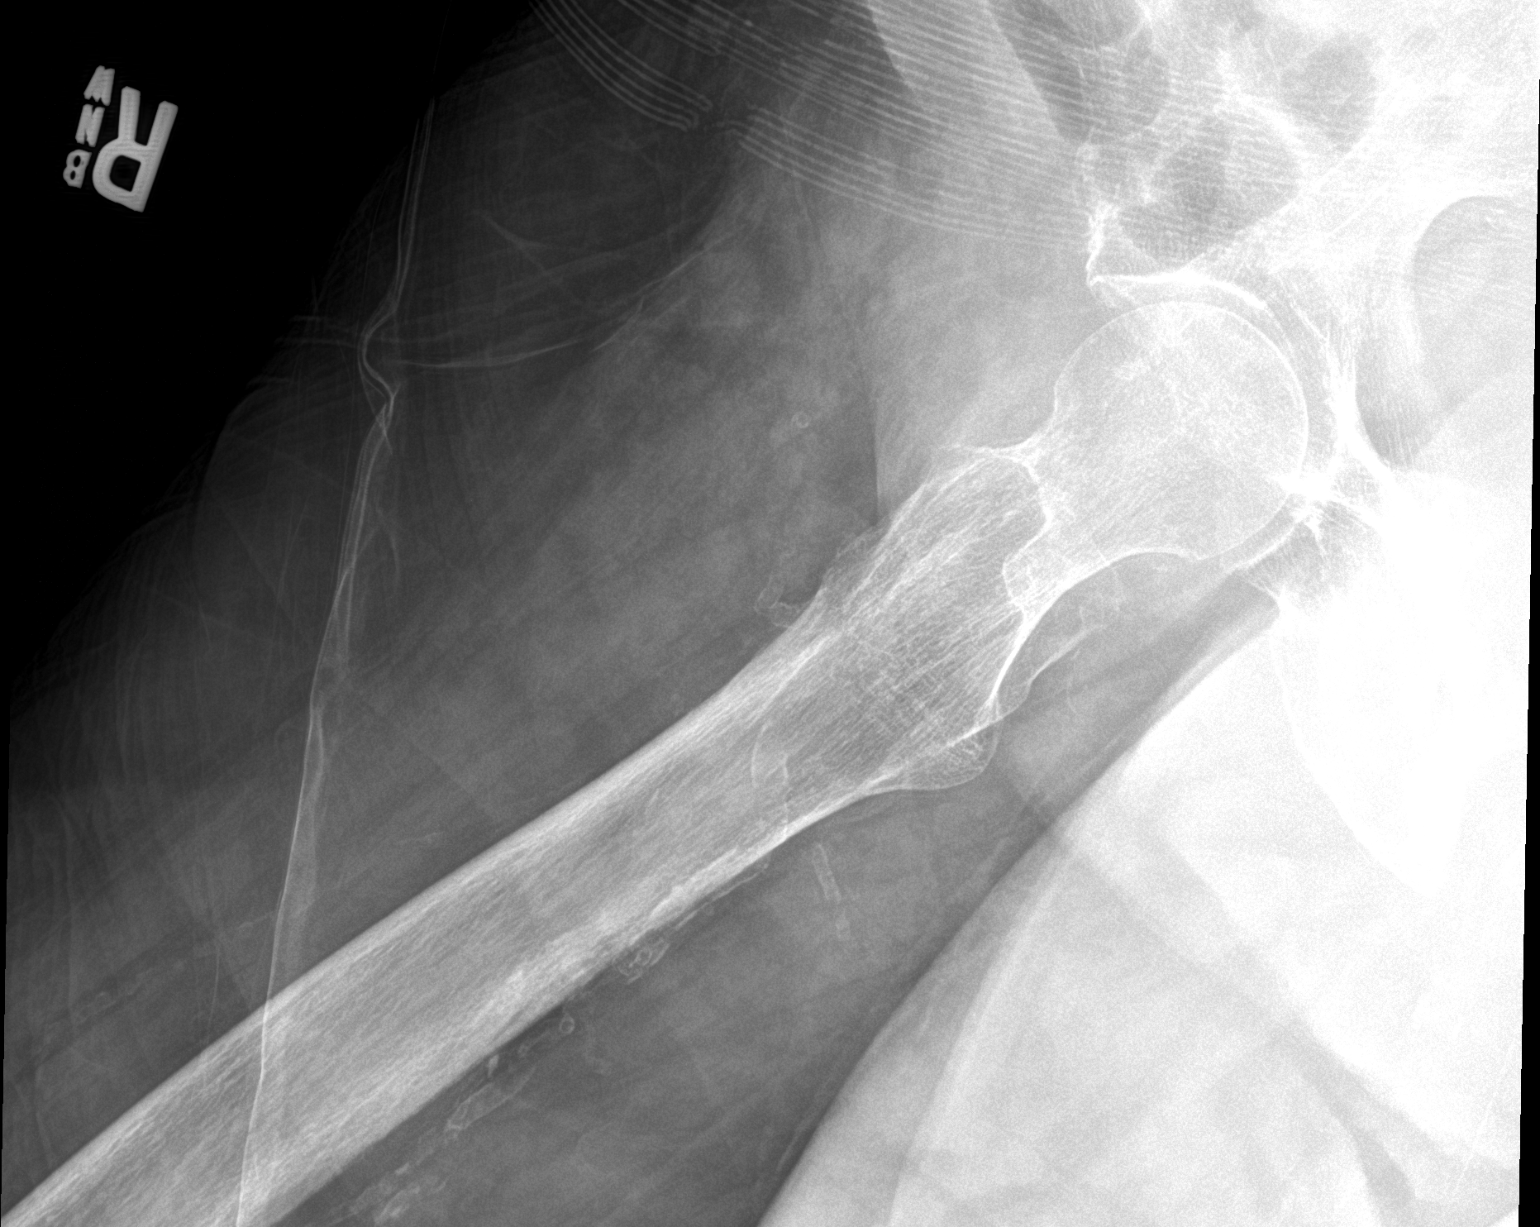

[3 of 3 positions shown; findings below may reference images not displayed]

FINDINGS: There is no acute fracture or subluxation. Dense atherosclerotic
calcification of the femoral arteries. Degenerative changes are seen
in the LOWER lumbar spine. Coarse calcifications in the pelvis are
consistent with calcified fibroids. Bowel sutures are noted in the
LOWER pelvis.
IMPRESSION: No evidence for acute abnormality.

## 2021-03-30 DIAGNOSIS — I129 Hypertensive chronic kidney disease with stage 1 through stage 4 chronic kidney disease, or unspecified chronic kidney disease: Secondary | ICD-10-CM | POA: Diagnosis not present

## 2021-03-30 DIAGNOSIS — Z1159 Encounter for screening for other viral diseases: Secondary | ICD-10-CM | POA: Diagnosis not present

## 2021-04-01 DIAGNOSIS — Z1159 Encounter for screening for other viral diseases: Secondary | ICD-10-CM | POA: Diagnosis not present

## 2021-04-01 DIAGNOSIS — I129 Hypertensive chronic kidney disease with stage 1 through stage 4 chronic kidney disease, or unspecified chronic kidney disease: Secondary | ICD-10-CM | POA: Diagnosis not present

## 2021-04-05 DIAGNOSIS — L602 Onychogryphosis: Secondary | ICD-10-CM | POA: Diagnosis not present

## 2021-04-05 DIAGNOSIS — L84 Corns and callosities: Secondary | ICD-10-CM | POA: Diagnosis not present

## 2021-04-05 DIAGNOSIS — I739 Peripheral vascular disease, unspecified: Secondary | ICD-10-CM | POA: Diagnosis not present

## 2021-04-06 DIAGNOSIS — Z1159 Encounter for screening for other viral diseases: Secondary | ICD-10-CM | POA: Diagnosis not present

## 2021-04-06 DIAGNOSIS — I129 Hypertensive chronic kidney disease with stage 1 through stage 4 chronic kidney disease, or unspecified chronic kidney disease: Secondary | ICD-10-CM | POA: Diagnosis not present

## 2021-05-04 ENCOUNTER — Encounter: Payer: Self-pay | Admitting: Adult Health

## 2021-05-04 ENCOUNTER — Non-Acute Institutional Stay (SKILLED_NURSING_FACILITY): Payer: Medicare HMO | Admitting: Adult Health

## 2021-05-04 DIAGNOSIS — Z8781 Personal history of (healed) traumatic fracture: Secondary | ICD-10-CM

## 2021-05-04 DIAGNOSIS — N184 Chronic kidney disease, stage 4 (severe): Secondary | ICD-10-CM

## 2021-05-04 DIAGNOSIS — I700 Atherosclerosis of aorta: Secondary | ICD-10-CM | POA: Diagnosis not present

## 2021-05-04 NOTE — Progress Notes (Signed)
Location:  [facility] Room Number: 109-D Place of Service:  SNF (31)   CODE STATUS: DNR  No Known Allergies  Chief Complaint  Patient presents with   Medical Management of Chronic Issues       History of colon cancer:   Aortic atherosclerosis  CKD (chronic kidney disease) stage IV    HPI:  She is a 87 year old long term resident of this facility being seen for the management of her chronic illnesses: History of colon cancer:   Aortic atherosclerosis   CKD (chronic kidney disease) stage IV. There are no reports of uncontrolled pain; no significant change in weight. There are no reports of anxiety.   Past Medical History:  Diagnosis Date   Anemia    Arthritis    History of blood transfusion    "today is the first time" (08/05/2014)   Hypertension    Left femoral shaft fracture ([place]) 08/08/2014    Past Surgical History:  Procedure Laterality Date   APPENDECTOMY     CATARACT EXTRACTION Left    CHOLECYSTECTOMY     COLONOSCOPY N/A 09/26/2014   Procedure: COLONOSCOPY;  Surgeon: [name], MD;  [facility];  Service: Endoscopy;  Laterality: N/A;  1030am   CYSTECTOMY     "had cyst taken off lower back"   ESOPHAGOGASTRODUODENOSCOPY N/A 08/22/2014   FSF:SELTRVUY ring at the gastro junction/small HH   ORIF FEMUR FRACTURE Left 08/07/2014   Procedure: OPEN REDUCTION INTERNAL FIXATION (ORIF)  LEFT FEMUR FRACTURE;  Surgeon: [name], MD;  Location: [place];  Service: Orthopedics;  Laterality: Left;   TONSILLECTOMY      Social History   Socioeconomic History   Marital status: Widowed    Spouse name: Not on file   Number of children: Not on file   Years of education: Not on file   Highest education level: Not on file  Occupational History   Occupation: retired   Tobacco Use   Smoking status: Former    Packs/day: 0.25    Years: 16.00    Pack years: 4.00    Types: Cigarettes   Smokeless tobacco: Never  Vaping Use   Vaping Use:  Never used  Substance and Sexual Activity   Alcohol use: No   Drug use: No   Sexual activity: Never  Other Topics Concern   Not on file  Social History Narrative   Long term resident of [facility]    Social Determinants of Health   Financial Resource Strain: Not on file  Food Insecurity: Not on file  Transportation Needs: Not on file  Physical Activity: Not on file  Stress: Not on file  Social Connections: Not on file  Intimate Partner Violence: Not on file   Family History  Family history unknown: Yes      VITAL SIGNS BP (!) 127/57   Pulse 67   Temp 97.8 F (36.6 C)   Resp 20   Ht 4\' 11"  (1.499 m)   Wt 84 lb (38.1 kg)   SpO2 99%   BMI 16.97 kg/m   Outpatient Encounter Medications as of 05/04/2021  Medication Sig   acetaminophen (TYLENOL) 325 MG tablet Take 650 mg by mouth every 6 (six) hours as needed.   amLODipine (NORVASC) 5 MG tablet Take 5 mg by mouth daily.    ferrous sulfate 325 (65 FE) MG tablet Take 325 mg by mouth daily with breakfast.    metoprolol (LOPRESSOR) 50 MG tablet Take 1  tablet (50 mg total) by mouth 2 (two) times daily.   Polyethyl Glycol-Propyl Glycol (SYSTANE) 0.4-0.3 % GEL ophthalmic gel Place 1 application into both eyes 2 (two) times daily at 10 AM and 5 PM.   sodium bicarbonate 650 MG tablet Take 650 mg by mouth 2 (two) times daily.   [DISCONTINUED] NON FORMULARY Diet Change: Dysphagia 2 diet with ground meats.   [DISCONTINUED] NON FORMULARY Wanderguard for safety awareness. Check placement and function qshift. Alert tag number [id number]. Every Shift Day, Evening, Night   No facility-administered encounter medications on file as of 05/04/2021.     SIGNIFICANT DIAGNOSTIC EXAMS  NO RECENT EXAMS.    LABS REVIEWED PREVIOUS;    07-29-20: wbc 5.3; hgb 11.2; hct 36.4; mcv 87.3 plt 269; glucose 103; bun 48; creat 2.11; k+ 4.2; na++ 139; ca 9.7 liver normal albumin 3.9 tsh 1.674 vit D 124.76  12-24-20: wbc 5.8; hgb 11.0; hct 36.1; mcv 87.4 plt 295;  glucose 90; bun 43; creat 2.11; k+ 3.7; na++ 136; ca 8.8 GFR 21; liver normal albumin 3.7 tsh 2.216 vit D 44.35 02-15-21: wbc 5.8; hgb 10.0; hct 32.2; mcv 85.4 plt 273; glucose 84; bun 51; creat 3.05; k+ 4.2; na++ 138; ca 9.0; GFR 14   NO NEW LABS.    Review of Systems  Unable to perform ROS: Dementia (unable to participate)    Physical Exam Constitutional:      General: She is not in acute distress.    Appearance: She is well-developed. She is not diaphoretic.  Neck:     Thyroid: No thyromegaly.  Cardiovascular:     Rate and Rhythm: Normal rate and regular rhythm.     Pulses: Normal pulses.     Heart sounds: Normal heart sounds.  Pulmonary:     Effort: Pulmonary effort is normal. No respiratory distress.     Breath sounds: Normal breath sounds.  Abdominal:     General: Bowel sounds are normal. There is no distension.     Palpations: Abdomen is soft.     Tenderness: There is no abdominal tenderness.  Musculoskeletal:     Cervical back: Neck supple.     Right lower leg: No edema.     Left lower leg: No edema.     Comments:  Is able to move all extremities Uses wheelchair History of left femur ORIF       Lymphadenopathy:     Cervical: No cervical adenopathy.  Skin:    General: Skin is warm and dry.  Neurological:     Mental Status: She is alert. Mental status is at baseline.  Psychiatric:        Mood and Affect: Mood normal.    ASSESSMENT/ PLAN:  TODAY:   History of colon cancer: will monitor  2. Aortic atherosclerosis(ct 05-25-19) will monitor   3. CKD (chronic kidney disease) stage IV; bun 51; creat 3.05 GFR 14; will continue sodium bicarbonate 650 mg twice daily   PREVIOUS   4. Benign hypertension with CKD (chronic kidney disease) stage IV: is stable b/p 127/57 will continue lopressor 25 mg twice daily norvasc 5 mg daily   5. Dementia without behavioral disturbance unspecified dementia type: is without change weight is 84 pounds; is slowly losing weight    6.  Chronic idiopathic constipation: is stable off medications will monitor  7. Unspecified iron deficiency anemia: is stable hgb 10.0 will continue iron daily   8. Vitamin D deficiency: is stable is 44.35 will monitor   9. Left  femoral shaft fracture with history of left femur ORIF is stable  10. Failure to thrive in adult: weight is 85 pounds; supplements as directed continues to slowly lose weight.      Ok Edwards NP [facility]  Contact [phone] Monday through Friday 8am- 5pm  After hours call [phone]

## 2021-05-20 ENCOUNTER — Encounter: Payer: Self-pay | Admitting: Adult Health

## 2021-05-20 ENCOUNTER — Non-Acute Institutional Stay (SKILLED_NURSING_FACILITY): Payer: Medicare HMO | Admitting: Adult Health

## 2021-05-20 DIAGNOSIS — R627 Adult failure to thrive: Secondary | ICD-10-CM

## 2021-05-20 DIAGNOSIS — N184 Chronic kidney disease, stage 4 (severe): Secondary | ICD-10-CM | POA: Diagnosis not present

## 2021-05-20 DIAGNOSIS — F32A Depression, unspecified: Secondary | ICD-10-CM | POA: Diagnosis not present

## 2021-05-20 DIAGNOSIS — F0390 Unspecified dementia without behavioral disturbance: Secondary | ICD-10-CM | POA: Diagnosis not present

## 2021-05-20 NOTE — Progress Notes (Signed)
Location:  [place] [facility] Room Number: 109-D Place of Service:  SNF (31)   CODE STATUS: DNR  No Known Allergies  Chief Complaint  Patient presents with   Acute Visit    Care plan meeting    HPI:  We have come together for her care plan meeting.  no BIMS mood 4/30: trouble concentrating; decreased appetite; poor interest in activities; short tempered.  She is nonambulatory. She requires limited to extensive assist with her adls. She requires assist to complete meals. She is incontinent of bladder and bowel. She has had 2 falls without injury . Dietary puree diet thin liquids  weight is 82.4 pounds down 3.2 pounds over the past 3 months. Her appetite is poor to fair.  Therapy none at this.   She continues to be followed for her chronic illnesses including:   Dementia without behavioral disturbance unspecified dementia type Chronic kidney disease (CKD) stage IV  Failure to thrive in adult  Chronic depression  Past Medical History:  Diagnosis Date   Anemia    Arthritis    History of blood transfusion    "today is the first time" (08/05/2014)   Hypertension    Left femoral shaft fracture ([place]) 08/08/2014    Past Surgical History:  Procedure Laterality Date   APPENDECTOMY     CATARACT EXTRACTION Left    CHOLECYSTECTOMY     COLONOSCOPY N/A 09/26/2014   Procedure: COLONOSCOPY;  Surgeon: [name], MD;  [facility];  Service: Endoscopy;  Laterality: N/A;  1030am   CYSTECTOMY     "had cyst taken off lower back"   ESOPHAGOGASTRODUODENOSCOPY N/A 08/22/2014   SAY:[id number] ring at the gastro junction/small HH   ORIF FEMUR FRACTURE Left 08/07/2014   Procedure: OPEN REDUCTION INTERNAL FIXATION (ORIF)  LEFT FEMUR FRACTURE;  Surgeon: [name], MD;  Location: [place];  Service: Orthopedics;  Laterality: Left;   TONSILLECTOMY      Social History   Socioeconomic History   Marital status: Widowed    Spouse name: Not on file   Number of children: Not  on file   Years of education: Not on file   Highest education level: Not on file  Occupational History   Occupation: retired   Tobacco Use   Smoking status: Former    Packs/day: 0.25    Years: 16.00    Pack years: 4.00    Types: Cigarettes   Smokeless tobacco: Never  Vaping Use   Vaping Use: Never used  Substance and Sexual Activity   Alcohol use: No   Drug use: No   Sexual activity: Never  Other Topics Concern   Not on file  Social History Narrative   Long term resident of [facility]    Social Determinants of Health   Financial Resource Strain: Not on file  Food Insecurity: Not on file  Transportation Needs: Not on file  Physical Activity: Not on file  Stress: Not on file  Social Connections: Not on file  Intimate Partner Violence: Not on file   Family History  Family history unknown: Yes      VITAL SIGNS BP (!) 111/48   Pulse 76   Temp 97.7 F (36.5 C)   Resp 16   Ht 4\' 11"  (1.499 m)   Wt 82 lb 6.4 oz (37.4 kg)   SpO2 99%   BMI 16.64 kg/m   Outpatient Encounter Medications as of 05/20/2021  Medication Sig   acetaminophen (TYLENOL) 325 MG tablet Take  650 mg by mouth every 6 (six) hours as needed.   amLODipine (NORVASC) 5 MG tablet Take 5 mg by mouth daily.    ferrous sulfate 325 (65 FE) MG tablet Take 325 mg by mouth daily with breakfast.    metoprolol (LOPRESSOR) 50 MG tablet Take 1 tablet (50 mg total) by mouth 2 (two) times daily.   NON FORMULARY Diet change: Pureed diet   NON FORMULARY Wanderguard tag #[id number] to left ankle for safety awareness. Check placement and function qshift.   Polyethyl Glycol-Propyl Glycol (SYSTANE) 0.4-0.3 % GEL ophthalmic gel Place 1 application into both eyes 2 (two) times daily at 10 AM and 5 PM.   sodium bicarbonate 650 MG tablet Take 650 mg by mouth 2 (two) times daily.   No facility-administered encounter medications on file as of 05/20/2021.     SIGNIFICANT DIAGNOSTIC EXAMS   NO RECENT EXAMS.    LABS REVIEWED PREVIOUS;     07-29-20: wbc 5.3; hgb 11.2; hct 36.4; mcv 87.3 plt 269; glucose 103; bun 48; creat 2.11; k+ 4.2; na++ 139; ca 9.7 liver normal albumin 3.9 tsh 1.674 vit D 124.76  12-24-20: wbc 5.8; hgb 11.0; hct 36.1; mcv 87.4 plt 295; glucose 90; bun 43; creat 2.11; k+ 3.7; na++ 136; ca 8.8 GFR 21; liver normal albumin 3.7 tsh 2.216 vit D 44.35 02-15-21: wbc 5.8; hgb 10.0; hct 32.2; mcv 85.4 plt 273; glucose 84; bun 51; creat 3.05; k+ 4.2; na++ 138; ca 9.0; GFR 14   NO NEW LABS.    Review of Systems  Unable to perform ROS: Dementia (unable to participate)   Physical Exam Constitutional:      General: She is not in acute distress.    Appearance: She is underweight. She is not diaphoretic.  Neck:     Thyroid: No thyromegaly.  Cardiovascular:     Rate and Rhythm: Normal rate and regular rhythm.     Pulses: Normal pulses.     Heart sounds: Normal heart sounds.  Pulmonary:     Effort: Pulmonary effort is normal. No respiratory distress.     Breath sounds: Normal breath sounds.  Abdominal:     General: Bowel sounds are normal. There is no distension.     Palpations: Abdomen is soft.     Tenderness: There is no abdominal tenderness.  Musculoskeletal:        General: Normal range of motion.     Cervical back: Neck supple.     Right lower leg: No edema.     Left lower leg: No edema.  Lymphadenopathy:     Cervical: No cervical adenopathy.  Skin:    General: Skin is warm and dry.     Comments: Has chronic large cyst on sacral area of lower back   Neurological:     Mental Status: She is alert. Mental status is at baseline.  Psychiatric:        Mood and Affect: Mood normal.      ASSESSMENT/ PLAN:  TODAY  Dementia without behavioral disturbance unspecified dementia type Chronic kidney disease (CKD) stage IV Failure to thrive in adult Chronic depression   Will continue current medications Will continue current plan of care Will continue to monitor her status.   Time spent with  patient: 40 minutes: medications; plan of care.     Ok Edwards NP [facility]  Contact [phone] Monday through Friday 8am- 5pm  After hours call [phone]

## 2021-06-03 ENCOUNTER — Non-Acute Institutional Stay (SKILLED_NURSING_FACILITY): Payer: Medicare HMO | Admitting: Adult Health

## 2021-06-03 ENCOUNTER — Encounter: Payer: Self-pay | Admitting: Adult Health

## 2021-06-03 DIAGNOSIS — N184 Chronic kidney disease, stage 4 (severe): Secondary | ICD-10-CM | POA: Diagnosis not present

## 2021-06-03 DIAGNOSIS — I129 Hypertensive chronic kidney disease with stage 1 through stage 4 chronic kidney disease, or unspecified chronic kidney disease: Secondary | ICD-10-CM

## 2021-06-03 DIAGNOSIS — K5904 Chronic idiopathic constipation: Secondary | ICD-10-CM

## 2021-06-03 DIAGNOSIS — F01C0 Vascular dementia, severe, without behavioral disturbance, psychotic disturbance, mood disturbance, and anxiety: Secondary | ICD-10-CM

## 2021-06-03 NOTE — Progress Notes (Signed)
Location:  [facility] Room Number: 109-D Place of Service:  SNF (31)   CODE STATUS: DNR  No Known Allergies  Chief Complaint  Patient presents with   Medical Management of Chronic Issues                Benign hypertension with CKD (chronic kidney disease) stage IV:  Dementia without behavioral disturbance unspecified dementia type:   Chronic idiopathic constipation:     HPI:  She is a 87 year old long term resident of this facility being seen for the management of her chronic illnesses:  Benign hypertension with CKD (chronic kidney disease) stage IV:  Dementia without behavioral disturbance unspecified dementia type:   Chronic idiopathic constipation.  There are no reports of uncontrolled pain; no reports of agitation or anxiety. She continues to slowly lose weight.   Past Medical History:  Diagnosis Date   Anemia    Arthritis    History of blood transfusion    "today is the first time" (08/05/2014)   Hypertension    Left femoral shaft fracture ([place]) 08/08/2014    Past Surgical History:  Procedure Laterality Date   APPENDECTOMY     CATARACT EXTRACTION Left    CHOLECYSTECTOMY     COLONOSCOPY N/A 09/26/2014   Procedure: COLONOSCOPY;  Surgeon: [name], MD;  [facility];  Service: Endoscopy;  Laterality: N/A;  1030am   CYSTECTOMY     "had cyst taken off lower back"   ESOPHAGOGASTRODUODENOSCOPY N/A 08/22/2014   HYI:[id number] ring at the gastro junction/small HH   ORIF FEMUR FRACTURE Left 08/07/2014   Procedure: OPEN REDUCTION INTERNAL FIXATION (ORIF)  LEFT FEMUR FRACTURE;  Surgeon: [name], MD;  Location: [facility] ;  Service: Orthopedics;  Laterality: Left;   TONSILLECTOMY      Social History   Socioeconomic History   Marital status: Widowed    Spouse name: Not on file   Number of children: Not on file   Years of education: Not on file   Highest education level: Not on file  Occupational History   Occupation: retired    Tobacco Use   Smoking status: Former    Packs/day: 0.25    Years: 16.00    Pack years: 4.00    Types: Cigarettes   Smokeless tobacco: Never  Vaping Use   Vaping Use: Never used  Substance and Sexual Activity   Alcohol use: No   Drug use: No   Sexual activity: Never  Other Topics Concern   Not on file  Social History Narrative   [facility] resident of [facility]    Social Determinants of Health   Financial Resource Strain: Not on file  Food Insecurity: Not on file  Transportation Needs: Not on file  Physical Activity: Not on file  Stress: Not on file  Social Connections: Not on file  Intimate Partner Violence: Not on file   Family History  Family history unknown: Yes      VITAL SIGNS BP 118/62   Pulse 69   Temp (!) 97.5 F (36.4 C)   Resp 18   Ht 4\' 11"  (1.499 m)   Wt 82 lb 6.4 oz (37.4 kg)   SpO2 99%   BMI 16.64 kg/m   Outpatient Encounter Medications as of 06/03/2021  Medication Sig   acetaminophen (TYLENOL) 325 MG tablet Take 650 mg by mouth every 6 (six) hours as needed.   amLODipine (NORVASC) 5 MG tablet Take 5 mg by mouth daily.  ferrous sulfate 325 (65 FE) MG tablet Take 325 mg by mouth daily with breakfast.    metoprolol (LOPRESSOR) 50 MG tablet Take 1 tablet (50 mg total) by mouth 2 (two) times daily.   NON FORMULARY Diet change: Pureed diet   NON FORMULARY Wanderguard tag #[id number] to left ankle for safety awareness. Check placement and function qshift.   Polyethyl Glycol-Propyl Glycol (SYSTANE) 0.4-0.3 % GEL ophthalmic gel Place 1 application into both eyes 2 (two) times daily at 10 AM and 5 PM.   sodium bicarbonate 650 MG tablet Take 650 mg by mouth 2 (two) times daily.   No facility-administered encounter medications on file as of 06/03/2021.     SIGNIFICANT DIAGNOSTIC EXAMS  NO RECENT EXAMS.    LABS REVIEWED PREVIOUS;    07-29-20: wbc 5.3; hgb 11.2; hct 36.4; mcv 87.3 plt 269; glucose 103; bun 48; creat 2.11; k+ 4.2; na++ 139; ca 9.7 liver normal  albumin 3.9 tsh 1.674 vit D 124.76  12-24-20: wbc 5.8; hgb 11.0; hct 36.1; mcv 87.4 plt 295; glucose 90; bun 43; creat 2.11; k+ 3.7; na++ 136; ca 8.8 GFR 21; liver normal albumin 3.7 tsh 2.216 vit D 44.35 02-15-21: wbc 5.8; hgb 10.0; hct 32.2; mcv 85.4 plt 273; glucose 84; bun 51; creat 3.05; k+ 4.2; na++ 138; ca 9.0; GFR 14   NO NEW LABS.    Review of Systems  Unable to perform ROS: Dementia (unable to participate)   Physical Exam Constitutional:      General: She is not in acute distress.    Appearance: She is underweight. She is not diaphoretic.  Neck:     Thyroid: No thyromegaly.  Cardiovascular:     Rate and Rhythm: Normal rate and regular rhythm.     Pulses: Normal pulses.     Heart sounds: Normal heart sounds.  Pulmonary:     Effort: Pulmonary effort is normal. No respiratory distress.     Breath sounds: Normal breath sounds.  Abdominal:     General: Bowel sounds are normal. There is no distension.     Palpations: Abdomen is soft.     Tenderness: There is no abdominal tenderness.  Musculoskeletal:     Cervical back: Neck supple.     Right lower leg: No edema.     Left lower leg: No edema.     Comments:  Is able to move all extremities Uses wheelchair History of left femur ORIF  Lymphadenopathy:     Cervical: No cervical adenopathy.  Skin:    General: Skin is warm and dry.     Comments: Has chronic large cyst on sacral area of lower back    Neurological:     Mental Status: She is alert. Mental status is at baseline.  Psychiatric:        Mood and Affect: Mood normal.     ASSESSMENT/ PLAN:  TODAY:   Benign hypertension with CKD (chronic kidney disease) stage IV: is stable b/p 118/62 will continue lopressor 50 mg twice daily and norvasc 5 mg daily   2. Dementia without behavioral disturbance unspecified dementia type: is slowly losing weight current weight is 82 pounds; will monitor   3. Chronic idiopathic constipation: is stable is off medications.   PREVIOUS    4. Unspecified iron deficiency anemia: is stable hgb 10.0 will continue iron daily   5. Vitamin D deficiency: is stable is 44.35 will monitor   6. Left femoral shaft fracture with history of left femur ORIF is stable  7. Failure to thrive in adult: weight is 82 pounds; supplements as directed continues to slowly lose weight.   8. History of colon cancer: will monitor  9 Aortic atherosclerosis(ct 05-25-19) will monitor   10. CKD (chronic kidney disease) stage IV; bun 51; creat 3.05 GFR 14; will continue sodium bicarbonate 650 mg twice daily         Ok Edwards NP [facility]  Contact [phone] Monday through Friday 8am- 5pm  After hours call [phone]

## 2021-06-22 ENCOUNTER — Encounter: Payer: Self-pay | Admitting: Internal Medicine

## 2021-06-22 ENCOUNTER — Non-Acute Institutional Stay (SKILLED_NURSING_FACILITY): Payer: Medicare HMO | Admitting: Internal Medicine

## 2021-06-22 DIAGNOSIS — N184 Chronic kidney disease, stage 4 (severe): Secondary | ICD-10-CM

## 2021-06-22 DIAGNOSIS — F01C0 Vascular dementia, severe, without behavioral disturbance, psychotic disturbance, mood disturbance, and anxiety: Secondary | ICD-10-CM | POA: Diagnosis not present

## 2021-06-22 DIAGNOSIS — D649 Anemia, unspecified: Secondary | ICD-10-CM | POA: Diagnosis not present

## 2021-06-22 DIAGNOSIS — I129 Hypertensive chronic kidney disease with stage 1 through stage 4 chronic kidney disease, or unspecified chronic kidney disease: Secondary | ICD-10-CM

## 2021-06-22 NOTE — Assessment & Plan Note (Signed)
There has been progression of her CKD and she is now CKD stage V with creatinine of 3.05 and GFR 14.  Prior values were 2.11/21.  No nephrotoxic drugs documented on review of med list..

## 2021-06-22 NOTE — Assessment & Plan Note (Signed)
She can provide no meaningful history and can follow no commands.  She asked me 3 times who I was even though I had had identified myself when I entered the room.

## 2021-06-22 NOTE — Assessment & Plan Note (Signed)
Current blood pressure is actually soft.  I will discuss decreasing the beta-blocker dosage to 25 mg twice daily with [facility] NP.

## 2021-06-22 NOTE — Assessment & Plan Note (Addendum)
There has been slight progression of anemia with H/H dropping from 11/36.1 down to 10/32.2.  No bleeding dyscrasias reported. She is not candidate for any GI evaluation of anemia; & it most likely is related to progressive CKD to ESRD.

## 2021-06-22 NOTE — Progress Notes (Signed)
   NURSING HOME LOCATION:   [facility] ROOM NUMBER:  [id number]  CODE STATUS:  DNR  PCP:  Ok Edwards NP  This is a nursing facility follow up visit for of chronic medical diagnoses & to document compliance with Regulation 483.30 (c) in The [place] Manual Phase 2 which mandates caregiver visit ( visits can alternate among physician, PA or NP as per statutes) within 10 days of 30 days / 60 days/ 90 days post admission to SNF date    Interim medical record and care since last SNF visit was updated with review of diagnostic studies and change in clinical status since last visit were documented.  HPI: She is a permanent resident of this facility with diagnoses of chronic anemia, degenerative joint disease, essential hypertension, and dementia.  Labs performed in June are summarized in the Current Assessment & Plan in the Problem List.  Review of systems: She can provide no history.  She asked me 3 times who I was even though I had identified myself upon entering the room.  She denied any active symptoms or signs.  She can follow no commands, even such as "please open your mouth" or "push my hand away".  Staff reports sacral ulcer with malodorous character.  Because of her age , code status,and advanced comorbidities no surgery has been proposed.  Physical exam:  Pertinent or positive findings: She appears chronically ill and malnourished.  She has alternating exotropia.  Voice is markedly weak and slightly hoarse.  She is edentulous.  Heart sounds are slightly distant and breath sounds are slightly decreased.  Pedal pulses are decreased.  Limbs are atrophic.  General appearance: no acute distress, increased work of breathing is present.   Lymphatic: No lymphadenopathy about the head, neck, axilla. Eyes: No conjunctival inflammation or lid edema is present. There is no scleral icterus. Ears:  External ear exam shows no significant lesions or deformities.   Nose:   External nasal examination shows no deformity or inflammation. Nasal mucosa are pink and moist without lesions, exudates Neck:  No thyromegaly, masses, tenderness noted.    Heart:  No gallop, murmur, click, rub .  Lungs: without wheezes, rhonchi, rales, rubs. Abdomen: Bowel sounds are normal. Abdomen is soft and nontender with no organomegaly, hernias, masses. GU: Deferred  Extremities:  No cyanosis, clubbing, edema  Neurologic exam :Strength could not be tested in any extremity Balance, Rhomberg, finger to nose testing could not be completed due to clinical state Skin: Warm & dry w/o tenting. No significant rash.  See summary under each active problem in the Problem List with associated updated therapeutic plan

## 2021-06-22 NOTE — Patient Instructions (Signed)
See assessment and plan under each diagnosis in the problem list and acutely for this visit 

## 2021-06-30 DIAGNOSIS — I129 Hypertensive chronic kidney disease with stage 1 through stage 4 chronic kidney disease, or unspecified chronic kidney disease: Secondary | ICD-10-CM | POA: Diagnosis not present

## 2021-06-30 DIAGNOSIS — Z1159 Encounter for screening for other viral diseases: Secondary | ICD-10-CM | POA: Diagnosis not present

## 2021-07-22 ENCOUNTER — Non-Acute Institutional Stay (SKILLED_NURSING_FACILITY): Payer: Medicare HMO | Admitting: Adult Health

## 2021-07-22 ENCOUNTER — Encounter: Payer: Self-pay | Admitting: Adult Health

## 2021-07-22 DIAGNOSIS — Z8781 Personal history of (healed) traumatic fracture: Secondary | ICD-10-CM | POA: Diagnosis not present

## 2021-07-22 DIAGNOSIS — D649 Anemia, unspecified: Secondary | ICD-10-CM

## 2021-07-22 DIAGNOSIS — R627 Adult failure to thrive: Secondary | ICD-10-CM | POA: Diagnosis not present

## 2021-07-22 DIAGNOSIS — E559 Vitamin D deficiency, unspecified: Secondary | ICD-10-CM | POA: Diagnosis not present

## 2021-07-22 NOTE — Progress Notes (Signed)
Location:  [facility] Room Number: 9 Place of Service:  SNF (31)   CODE STATUS: dnr   No Known Allergies  Chief Complaint  Patient presents with   Medical Management of Chronic Issues           Unspecified iron deficiency anemia: . Vitamin D deficiency. Left femoral shaft fracture  Failure to thrive in adult:    HPI:  She is a 87 year old long term resident of this facility being seen for the management of her chronic illnesses:  Unspecified iron deficiency anemia: . Vitamin D deficiency. Left femoral shaft fracture  Failure to thrive in adult:. There are no reports of uncontrolled pain. No reports of changes in appetite; weight is stable.   Past Medical History:  Diagnosis Date   Anemia    Arthritis    History of blood transfusion    "today is the first time" (08/05/2014)   Hypertension    Left femoral shaft fracture ([place]) 08/08/2014    Past Surgical History:  Procedure Laterality Date   APPENDECTOMY     CATARACT EXTRACTION Left    CHOLECYSTECTOMY     COLONOSCOPY N/A 09/26/2014   Procedure: COLONOSCOPY;  Surgeon: [name], MD;  [facility];  Service: Endoscopy;  Laterality: N/A;  1030am   CYSTECTOMY     "had cyst taken off lower back"   ESOPHAGOGASTRODUODENOSCOPY N/A 08/22/2014   RCV:[id number] ring at the gastro junction/small HH   ORIF FEMUR FRACTURE Left 08/07/2014   Procedure: OPEN REDUCTION INTERNAL FIXATION (ORIF)  LEFT FEMUR FRACTURE;  Surgeon: [name], MD;  Location: [facility];  Service: Orthopedics;  Laterality: Left;   TONSILLECTOMY      Social History   Socioeconomic History   Marital status: Widowed    Spouse name: Not on file   Number of children: Not on file   Years of education: Not on file   Highest education level: Not on file  Occupational History   Occupation: retired   Tobacco Use   Smoking status: Former    Packs/day: 0.25    Years: 16.00    Pack years: 4.00    Types: Cigarettes   Smokeless  tobacco: Never  Vaping Use   Vaping Use: Never used  Substance and Sexual Activity   Alcohol use: No   Drug use: No   Sexual activity: Never  Other Topics Concern   Not on file  Social History Narrative   Long term resident of [facility]    Social Determinants of Health   Financial Resource Strain: Not on file  Food Insecurity: Not on file  Transportation Needs: Not on file  Physical Activity: Not on file  Stress: Not on file  Social Connections: Not on file  Intimate Partner Violence: Not on file   Family History  Family history unknown: Yes      VITAL SIGNS BP (!) 139/57   Pulse 78   Temp 97.6 F (36.4 C)   Resp 20   Ht 4\' 11"  (1.499 m)   Wt 84 lb 9.6 oz (38.4 kg)   BMI 17.09 kg/m   Outpatient Encounter Medications as of 07/22/2021  Medication Sig   acetaminophen (TYLENOL) 325 MG tablet Take 650 mg by mouth every 6 (six) hours as needed.   amLODipine (NORVASC) 5 MG tablet Take 5 mg by mouth daily.    ferrous sulfate 325 (65 FE) MG tablet Take 325 mg by mouth daily with breakfast.  metoprolol (LOPRESSOR) 50 MG tablet Take 1 tablet (50 mg total) by mouth 2 (two) times daily.   NON FORMULARY Diet change: Pureed diet   NON FORMULARY Wanderguard tag #[id number] to left ankle for safety awareness. Check placement and function qshift.   Polyethyl Glycol-Propyl Glycol (SYSTANE) 0.4-0.3 % GEL ophthalmic gel Place 1 application into both eyes 2 (two) times daily at 10 AM and 5 PM.   sodium bicarbonate 650 MG tablet Take 650 mg by mouth 2 (two) times daily.   No facility-administered encounter medications on file as of 07/22/2021.     SIGNIFICANT DIAGNOSTIC EXAMS   NO RECENT EXAMS.    LABS REVIEWED PREVIOUS;    07-29-20: wbc 5.3; hgb 11.2; hct 36.4; mcv 87.3 plt 269; glucose 103; bun 48; creat 2.11; k+ 4.2; na++ 139; ca 9.7 liver normal albumin 3.9 tsh 1.674 vit D 124.76  12-24-20: wbc 5.8; hgb 11.0; hct 36.1; mcv 87.4 plt 295; glucose 90; bun 43; creat 2.11; k+ 3.7; na++  136; ca 8.8 GFR 21; liver normal albumin 3.7 tsh 2.216 vit D 44.35 02-15-21: wbc 5.8; hgb 10.0; hct 32.2; mcv 85.4 plt 273; glucose 84; bun 51; creat 3.05; k+ 4.2; na++ 138; ca 9.0; GFR 14   NO NEW LABS.    Review of Systems  Unable to perform ROS: Dementia (unable to participate)   Physical Exam Constitutional:      General: She is not in acute distress.    Appearance: She is underweight. She is not diaphoretic.  Neck:     Thyroid: No thyromegaly.  Cardiovascular:     Rate and Rhythm: Normal rate and regular rhythm.     Pulses: Normal pulses.     Heart sounds: Normal heart sounds.  Pulmonary:     Effort: Pulmonary effort is normal. No respiratory distress.     Breath sounds: Normal breath sounds.  Abdominal:     General: Bowel sounds are normal. There is no distension.     Palpations: Abdomen is soft.     Tenderness: There is no abdominal tenderness.  Musculoskeletal:     Cervical back: Neck supple.     Right lower leg: No edema.     Left lower leg: No edema.     Comments:  Is able to move all extremities Uses wheelchair History of left femur ORIF   Lymphadenopathy:     Cervical: No cervical adenopathy.  Skin:    General: Skin is warm and dry.     Comments:  Has chronic large cyst on sacral area of lower back     Neurological:     Mental Status: She is alert. Mental status is at baseline.  Psychiatric:        Mood and Affect: Mood normal.      ASSESSMENT/ PLAN:  TODAY:   Unspecified iron deficiency anemia: is stable hgb 10.0; will continue iron daily  2. Vitamin D deficiency: is stable 44.35 will monitor  3. Left femoral shaft fracture : history left femur ORIF: is stable  4. Failure to thrive in adult: weight is 84  pounds supplements as directed.    PREVIOUS   5. History of colon cancer: will monitor  6 Aortic atherosclerosis(ct 05-25-19) will monitor   7. CKD (chronic kidney disease) stage IV; bun 51; creat 3.05 GFR 14; will continue sodium bicarbonate  650 mg twice daily   8. Benign hypertension with CKD (chronic kidney disease) stage IV: is stable b/p 118/62 will continue lopressor 50 mg twice daily  and norvasc 5 mg daily   9. Dementia without behavioral disturbance unspecified dementia type: is slowly losing weight current weight is 82 pounds; will monitor   10. Chronic idiopathic constipation: is stable is off medications.    Will check cbc; cmp; tsh vit d       Ok Edwards NP [facility]  Contact [phone] Monday through Friday 8am- 5pm  After hours call [phone]

## 2021-07-27 ENCOUNTER — Other Ambulatory Visit (HOSPITAL_COMMUNITY)
Admission: RE | Admit: 2021-07-27 | Discharge: 2021-07-27 | Disposition: A | Discharge status: Home / Self Care | Payer: Medicare HMO | Source: Skilled Nursing Facility | Attending: Adult Health | Admitting: Adult Health

## 2021-07-27 DIAGNOSIS — I129 Hypertensive chronic kidney disease with stage 1 through stage 4 chronic kidney disease, or unspecified chronic kidney disease: Secondary | ICD-10-CM | POA: Insufficient documentation

## 2021-07-27 LAB — CBC
HCT: 32.8 % — ABNORMAL LOW (ref 36.0–46.0)
Hemoglobin: 10.3 g/dL — ABNORMAL LOW (ref 12.0–15.0)
MCH: 27.4 pg (ref 26.0–34.0)
MCHC: 31.4 g/dL (ref 30.0–36.0)
MCV: 87.2 fL (ref 80.0–100.0)
Platelets: 280 10*3/uL (ref 150–400)
RBC: 3.76 MIL/uL — ABNORMAL LOW (ref 3.87–5.11)
RDW: 13 % (ref 11.5–15.5)
WBC: 5.6 10*3/uL (ref 4.0–10.5)
nRBC: 0 % (ref 0.0–0.2)

## 2021-07-27 LAB — COMPREHENSIVE METABOLIC PANEL
ALT: 9 U/L (ref 0–44)
AST: 15 U/L (ref 15–41)
Albumin: 3.4 g/dL — ABNORMAL LOW (ref 3.5–5.0)
Alkaline Phosphatase: 56 U/L (ref 38–126)
Anion gap: 8 (ref 5–15)
BUN: 60 mg/dL — ABNORMAL HIGH (ref 8–23)
CO2: 26 mmol/L (ref 22–32)
Calcium: 8.5 mg/dL — ABNORMAL LOW (ref 8.9–10.3)
Chloride: 103 mmol/L (ref 98–111)
Creatinine, Ser: 2.46 mg/dL — ABNORMAL HIGH (ref 0.44–1.00)
GFR, Estimated: 18 mL/min — ABNORMAL LOW (ref >60)
Glucose, Bld: 88 mg/dL (ref 70–99)
Potassium: 4.5 mmol/L (ref 3.5–5.1)
Sodium: 137 mmol/L (ref 135–145)
Total Bilirubin: 0.3 mg/dL (ref 0.3–1.2)
Total Protein: 6.2 g/dL — ABNORMAL LOW (ref 6.5–8.1)

## 2021-07-28 ENCOUNTER — Other Ambulatory Visit (HOSPITAL_COMMUNITY)
Admission: RE | Admit: 2021-07-28 | Discharge: 2021-07-28 | Disposition: A | Discharge status: Home / Self Care | Payer: Medicare HMO | Source: Skilled Nursing Facility | Attending: Adult Health | Admitting: Adult Health

## 2021-07-28 DIAGNOSIS — I129 Hypertensive chronic kidney disease with stage 1 through stage 4 chronic kidney disease, or unspecified chronic kidney disease: Secondary | ICD-10-CM | POA: Insufficient documentation

## 2021-07-28 LAB — VITAMIN D 25 HYDROXY (VIT D DEFICIENCY, FRACTURES): Vit D, 25-Hydroxy: 51.94 ng/mL (ref 30–100)

## 2021-07-28 LAB — TSH: TSH: 1.335 u[IU]/mL (ref 0.350–4.500)

## 2021-08-13 ENCOUNTER — Non-Acute Institutional Stay (SKILLED_NURSING_FACILITY): Payer: Medicare HMO | Admitting: Adult Health

## 2021-08-13 ENCOUNTER — Encounter: Payer: Self-pay | Admitting: Adult Health

## 2021-08-13 DIAGNOSIS — I129 Hypertensive chronic kidney disease with stage 1 through stage 4 chronic kidney disease, or unspecified chronic kidney disease: Secondary | ICD-10-CM | POA: Diagnosis not present

## 2021-08-13 DIAGNOSIS — F01C0 Vascular dementia, severe, without behavioral disturbance, psychotic disturbance, mood disturbance, and anxiety: Secondary | ICD-10-CM

## 2021-08-13 DIAGNOSIS — I700 Atherosclerosis of aorta: Secondary | ICD-10-CM

## 2021-08-13 DIAGNOSIS — N184 Chronic kidney disease, stage 4 (severe): Secondary | ICD-10-CM

## 2021-08-13 NOTE — Progress Notes (Signed)
Location:  [facility] Room Number: 109-D Place of Service:  SNF (31)   CODE STATUS: DNR  No Known Allergies  Chief Complaint  Patient presents with   Acute Visit    Care plan meeting    HPI:  We have come together for her care plan meeting. BIMS none mood 3/30: trouble concentrating; decreased energy. She requires limited to extensive assist with her adls. She is incontinent of bladder and bowel. She is nonambulatory uses wheelchair. Has had 2 falls without injury. Dietary: pureed with thin liquids requires supervision with meals. Weight is 84.7 pounds and is stable. Therapy none at this time. 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; mood disturbance or anxiety Benign hypertension with CKD (chronic kidney disease) stage IV   Past Medical History:  Diagnosis Date   Anemia    Arthritis    History of blood transfusion    "today is the first time" (08/05/2014)   Hypertension    Left femoral shaft fracture ([place]) 08/08/2014    Past Surgical History:  Procedure Laterality Date   APPENDECTOMY     CATARACT EXTRACTION Left    CHOLECYSTECTOMY     COLONOSCOPY N/A 09/26/2014   Procedure: COLONOSCOPY;  Surgeon: [name], MD;  [facility];  Service: Endoscopy;  Laterality: N/A;  1030am   CYSTECTOMY     "had cyst taken off lower back"   ESOPHAGOGASTRODUODENOSCOPY N/A 08/22/2014   FAO:ZHYQMVHQ ring at the gastro junction/small HH   ORIF FEMUR FRACTURE Left 08/07/2014   Procedure: OPEN REDUCTION INTERNAL FIXATION (ORIF)  LEFT FEMUR FRACTURE;  Surgeon: [name], MD;  Location: [facility];  Service: Orthopedics;  Laterality: Left;   TONSILLECTOMY      Social History   Socioeconomic History   Marital status: Widowed    Spouse name: Not on file   Number of children: Not on file   Years of education: Not on file   Highest  education level: Not on file  Occupational History   Occupation: retired   Tobacco Use   Smoking status: Former    Packs/day: 0.25    Years: 16.00    Pack years: 4.00    Types: Cigarettes   Smokeless tobacco: Never  Vaping Use   Vaping Use: Never used  Substance and Sexual Activity   Alcohol use: No   Drug use: No   Sexual activity: Never  Other Topics Concern   Not on file  Social History Narrative   Long term resident of [facility]    Social Determinants of Health   Financial Resource Strain: Not on file  Food Insecurity: Not on file  Transportation Needs: Not on file  Physical Activity: Not on file  Stress: Not on file  Social Connections: Not on file  Intimate Partner Violence: Not on file   Family History  Family history unknown: Yes      VITAL SIGNS BP (!) 128/52   Pulse (!) 57   Temp (!) 96.1 F (35.6 C)   Resp 20   Ht 4\' 11"  (1.499 m)   Wt 84 lb 3.2 oz (38.2 kg)   SpO2 99%   BMI 17.01 kg/m   Outpatient Encounter Medications as of 08/13/2021  Medication Sig   acetaminophen (TYLENOL) 325 MG tablet Take 650 mg by mouth every 6 (six) hours as needed.   amLODipine (NORVASC) 5 MG tablet Take 5 mg by  mouth daily.    ferrous sulfate 325 (65 FE) MG tablet Take 325 mg by mouth daily with breakfast.    melatonin 5 MG TABS Take 5 mg by mouth at bedtime.   metoprolol tartrate (LOPRESSOR) 25 MG tablet Take 25 mg by mouth 2 (two) times daily.   NON FORMULARY Diet change: Pureed diet   NON FORMULARY Wanderguard tag #[id number] to left ankle for safety awareness. Check placement and function qshift.   Polyethyl Glycol-Propyl Glycol (SYSTANE) 0.4-0.3 % GEL ophthalmic gel Place 1 application into both eyes 2 (two) times daily at 10 AM and 5 PM.   sodium bicarbonate 650 MG tablet Take 650 mg by mouth 2 (two) times daily.   [DISCONTINUED] metoprolol (LOPRESSOR) 50 MG tablet Take 1 tablet (50 mg total) by mouth 2 (two) times daily.   No facility-administered encounter medications on  file as of 08/13/2021.     SIGNIFICANT DIAGNOSTIC EXAMS   NO RECENT EXAMS.    LABS REVIEWED PREVIOUS;    12-24-20: wbc 5.8; hgb 11.0; hct 36.1; mcv 87.4 plt 295; glucose 90; bun 43; creat 2.11; k+ 3.7; na++ 136; ca 8.8 GFR 21; liver normal albumin 3.7 tsh 2.216 vit D 44.35 02-15-21: wbc 5.8; hgb 10.0; hct 32.2; mcv 85.4 plt 273; glucose 84; bun 51; creat 3.05; k+ 4.2; na++ 138; ca 9.0; GFR 14   TODAY  07-28-21: wbc 5.6; hgb 10.3; hct 32.8; mcv 87.2 plt 280; glucose 88; bun 60; creat 2.46; k+ 4.5; na++ 137; ca 8.5 GFR 18 liver normal albumin 3.4 tsh 1.335 vit D 51.94    Review of Systems  Unable to perform ROS: Dementia   Physical Exam Constitutional:      General: She is not in acute distress.    Appearance: She is underweight. She is not diaphoretic.  Neck:     Thyroid: No thyromegaly.  Cardiovascular:     Rate and Rhythm: Normal rate and regular rhythm.     Pulses: Normal pulses.     Heart sounds: Normal heart sounds.  Pulmonary:     Effort: Pulmonary effort is normal. No respiratory distress.     Breath sounds: Normal breath sounds.  Abdominal:     General: Bowel sounds are normal. There is no distension.     Palpations: Abdomen is soft.     Tenderness: There is no abdominal tenderness.  Musculoskeletal:     Cervical back: Neck supple.     Right lower leg: No edema.     Left lower leg: No edema.     Comments:  Is able to move all extremities Uses wheelchair History of left femur ORIF    Lymphadenopathy:     Cervical: No cervical adenopathy.  Skin:    General: Skin is warm and dry.     Comments:  Has chronic large cyst on sacral area of lower back      Neurological:     Mental Status: She is alert. Mental status is at baseline.  Psychiatric:        Mood and Affect: Mood normal.     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   Time spent with patient: 40 minutes: medications; plan of care.    Ok Edwards NP [facility] Adult [facility]  [phone]

## 2021-08-16 ENCOUNTER — Non-Acute Institutional Stay (SKILLED_NURSING_FACILITY): Payer: Medicare HMO | Admitting: Adult Health

## 2021-08-16 ENCOUNTER — Encounter: Payer: Self-pay | Admitting: Adult Health

## 2021-08-16 DIAGNOSIS — I129 Hypertensive chronic kidney disease with stage 1 through stage 4 chronic kidney disease, or unspecified chronic kidney disease: Secondary | ICD-10-CM

## 2021-08-16 DIAGNOSIS — I700 Atherosclerosis of aorta: Secondary | ICD-10-CM | POA: Diagnosis not present

## 2021-08-16 DIAGNOSIS — N184 Chronic kidney disease, stage 4 (severe): Secondary | ICD-10-CM | POA: Diagnosis not present

## 2021-08-16 NOTE — Progress Notes (Signed)
Location:  [place] Room Number: 109-D Place of Service:  SNF (31)   CODE STATUS: DNR  No Known Allergies  Chief Complaint  Patient presents with   Medical Management of Chronic Issues             Aortic atherosclerosis  CKD (chronic kidney disease) stage IV  Benign hypertension with CKD (Chronic kidney disease) stage IV    HPI:  She is a 87 year old long term resident of this facility being seen for the management of her chronic illnesses: Aortic atherosclerosis  CKD (chronic kidney disease) stage IV  Benign hypertension with CKD (Chronic kidney disease) stage IV. There are no reports of uncontrolled pain. No reports of weight loss; no reports of changes in eating patterns.   Past Medical History:  Diagnosis Date   Anemia    Arthritis    History of blood transfusion    "today is the first time" (08/05/2014)   Hypertension    Left femoral shaft fracture ([place]) 08/08/2014    Past Surgical History:  Procedure Laterality Date   APPENDECTOMY     CATARACT EXTRACTION Left    CHOLECYSTECTOMY     COLONOSCOPY N/A 09/26/2014   Procedure: COLONOSCOPY;  Surgeon: [name], MD;  [facility];  Service: Endoscopy;  Laterality: N/A;  1030am   CYSTECTOMY     "had cyst taken off lower back"   ESOPHAGOGASTRODUODENOSCOPY N/A 08/22/2014   VXB:[id number] ring at the gastro junction/small HH   ORIF FEMUR FRACTURE Left 08/07/2014   Procedure: OPEN REDUCTION INTERNAL FIXATION (ORIF)  LEFT FEMUR FRACTURE;  Surgeon: [name], MD;  Location: [place];  Service: Orthopedics;  Laterality: Left;   TONSILLECTOMY      Social History   Socioeconomic History   Marital status: Widowed    Spouse name: Not on file   Number of children: Not on file   Years of education: Not on file   Highest education level: Not on file  Occupational History   Occupation: retired   Tobacco Use   Smoking status: Former    Packs/day: 0.25    Years: 16.00    Pack years: 4.00     Types: Cigarettes   Smokeless tobacco: Never  Vaping Use   Vaping Use: Never used  Substance and Sexual Activity   Alcohol use: No   Drug use: No   Sexual activity: Never  Other Topics Concern   Not on file  Social History Narrative   Long term resident of [facility]    Social Determinants of Health   Financial Resource Strain: Not on file  Food Insecurity: Not on file  Transportation Needs: Not on file  Physical Activity: Not on file  Stress: Not on file  Social Connections: Not on file  Intimate Partner Violence: Not on file   Family History  Family history unknown: Yes      VITAL SIGNS BP (!) 150/60   Pulse 68   Temp (!) 97.3 F (36.3 C)   Resp (!) 22   Ht 4\' 11"  (1.499 m)   Wt 84 lb 3.2 oz (38.2 kg)   SpO2 99%   BMI 17.01 kg/m   Outpatient Encounter Medications as of 08/16/2021  Medication Sig   acetaminophen (TYLENOL) 325 MG tablet Take 650 mg by mouth every 6 (six) hours as needed.   amLODipine (NORVASC) 5 MG tablet Take 5 mg by mouth daily.    ferrous sulfate 325 (65 FE) MG tablet  Take 325 mg by mouth daily with breakfast.    melatonin 5 MG TABS Take 5 mg by mouth at bedtime.   metoprolol tartrate (LOPRESSOR) 25 MG tablet Take 25 mg by mouth 2 (two) times daily.   NON FORMULARY Wanderguard tag [phone] to left ankle for safety awareness. Check placement and function qshift.   Polyethyl Glycol-Propyl Glycol (SYSTANE) 0.4-0.3 % GEL ophthalmic gel Place 1 application into both eyes 2 (two) times daily at 10 AM and 5 PM.   sodium bicarbonate 650 MG tablet Take 650 mg by mouth 2 (two) times daily.   NON FORMULARY Diet change: Pureed diet   No facility-administered encounter medications on file as of 08/16/2021.     SIGNIFICANT DIAGNOSTIC EXAMS  NO RECENT EXAMS.    LABS REVIEWED PREVIOUS;    12-24-20: wbc 5.8; hgb 11.0; hct 36.1; mcv 87.4 plt 295; glucose 90; bun 43; creat 2.11; k+ 3.7; na++ 136; ca 8.8 GFR 21; liver normal albumin 3.7 tsh 2.216 vit D  44.35 02-15-21: wbc 5.8; hgb 10.0; hct 32.2; mcv 85.4 plt 273; glucose 84; bun 51; creat 3.05; k+ 4.2; na++ 138; ca 9.0; GFR 14  07-28-21: wbc 5.6; hgb 10.3; hct 32.8; mcv 87.2 plt 280; glucose 88; bun 60; creat 2.46; k+ 4.5; na++ 137; ca 8.5 GFR 18 liver normal albumin 3.4 tsh 1.335 vit D 51.94   NO NEW LABS.     Review of Systems  Unable to perform ROS: Dementia    Physical Exam Constitutional:      General: She is not in acute distress.    Appearance: She is underweight. She is not diaphoretic.  Neck:     Thyroid: No thyromegaly.  Cardiovascular:     Rate and Rhythm: Normal rate and regular rhythm.     Pulses: Normal pulses.     Heart sounds: Normal heart sounds.  Pulmonary:     Effort: Pulmonary effort is normal. No respiratory distress.     Breath sounds: Normal breath sounds.  Abdominal:     General: Bowel sounds are normal. There is no distension.     Palpations: Abdomen is soft.     Tenderness: There is no abdominal tenderness.  Musculoskeletal:     Cervical back: Neck supple.     Right lower leg: No edema.     Left lower leg: No edema.     Comments:  Is able to move all extremities Uses wheelchair History of left femur ORIF     Lymphadenopathy:     Cervical: No cervical adenopathy.  Skin:    General: Skin is warm and dry.     Comments: Has chronic large cyst on sacral area of lower back       Neurological:     Mental Status: She is alert. Mental status is at baseline.  Psychiatric:        Mood and Affect: Mood normal.     ASSESSMENT/ PLAN:  TODAY:   Aortic atherosclerosis (ct 05-25-19) will monitor  2. CKD (chronic kidney disease) stage IV bun 60 creat 2.46 GFR 18 will monitor   3. Benign hypertension with CKD (Chronic kidney disease) stage IV: is stable b/p 150/60 will continue lopressor 50 mg twice daily and norvasc 5 mg daily    PREVIOUS   4.  History of colon cancer: will monitor  5. Dementia without behavioral disturbance unspecified dementia  type: is slowly losing weight current weight is 84 pounds; will monitor   6. Chronic idiopathic constipation: is stable  is off medications.   7. Unspecified iron deficiency anemia: is stable hgb 10.3; will continue iron daily  8. Vitamin D deficiency: is stable 51.94 will monitor  9. Left femoral shaft fracture : history left femur ORIF: is stable  10. Failure to thrive in adult: weight is 84  pounds supplements as directed.     Ok Edwards NP [facility]  Contact [phone] Monday through Friday 8am- 5pm  After hours call [phone]

## 2021-09-09 DIAGNOSIS — I129 Hypertensive chronic kidney disease with stage 1 through stage 4 chronic kidney disease, or unspecified chronic kidney disease: Secondary | ICD-10-CM | POA: Diagnosis not present

## 2021-09-09 DIAGNOSIS — Z1159 Encounter for screening for other viral diseases: Secondary | ICD-10-CM | POA: Diagnosis not present

## 2021-09-16 ENCOUNTER — Non-Acute Institutional Stay (SKILLED_NURSING_FACILITY): Payer: Medicare HMO | Admitting: Internal Medicine

## 2021-09-16 ENCOUNTER — Encounter: Payer: Self-pay | Admitting: Internal Medicine

## 2021-09-16 DIAGNOSIS — I129 Hypertensive chronic kidney disease with stage 1 through stage 4 chronic kidney disease, or unspecified chronic kidney disease: Secondary | ICD-10-CM

## 2021-09-16 DIAGNOSIS — I700 Atherosclerosis of aorta: Secondary | ICD-10-CM

## 2021-09-16 DIAGNOSIS — N185 Chronic kidney disease, stage 5: Secondary | ICD-10-CM

## 2021-09-16 DIAGNOSIS — F01C0 Vascular dementia, severe, without behavioral disturbance, psychotic disturbance, mood disturbance, and anxiety: Secondary | ICD-10-CM | POA: Diagnosis not present

## 2021-09-16 DIAGNOSIS — D649 Anemia, unspecified: Secondary | ICD-10-CM | POA: Diagnosis not present

## 2021-09-16 DIAGNOSIS — N184 Chronic kidney disease, stage 4 (severe): Secondary | ICD-10-CM | POA: Diagnosis not present

## 2021-09-16 DIAGNOSIS — Z1159 Encounter for screening for other viral diseases: Secondary | ICD-10-CM | POA: Diagnosis not present

## 2021-09-16 NOTE — Patient Instructions (Signed)
See assessment and plan under each diagnosis in the problem list and acutely for this visit 

## 2021-09-16 NOTE — Progress Notes (Signed)
° °  NURSING HOME LOCATION:  [facility] ROOM NUMBER:    CODE STATUS:  DNR  PCP:  Ok Edwards NP,PSC  This is a nursing facility follow up visit of chronic medical diagnoses & to document compliance with Regulation 483.30 (c) in The [place] Manual Phase 2 which mandates caregiver visit ( visits can alternate among physician, PA or NP as per statutes) within 10 days of 30 days / 60 days/ 90 days post admission to SNF date    Interim medical record and care since last SNF visit was updated with review of diagnostic studies and change in clinical status since last visit were documented.  HPI: She is a permanent resident of this facility with medical diagnoses of history of colon cancer, aortic atherosclerosis, essential hypertension, DJD, chronic anemia, dementia and stage IV CKD.  Labs are current, completed 07/27/2021.  Creatinine was 2.46 with a GFR of 18 indicating CKD stage IV.  Renal function had improved from 02/15/21 when creatinine was 3.05 and GFR 14.  Albumin was 3.4 and total protein 6.2.  Chronic anemia was stable with H/H of 10.3/32.8 with normochromic, normocytic indices.  TSH was therapeutic at 1.335.  Review of systems: Dementia invalidated responses.  She confabulated throughout the interview.  Markedly decreased auditory acuity hindered communication. She kept talking about "Virgel Manifold".  Her response to my queries was "I do not know what you are talking about". According to her roommate she intermittently tries to block her from reentering the room but is easily calmed. The roommate also states that the patient has rhinitis when she cries.  She typically cries intermittently throughout the day.  She will talk to her long deceased parents and God. Roommate states that she has had rhinitis for the last 3 days without the associated crying episodes.  Physical exam:  Pertinent or positive findings: There is slight exotropia on the right.   Proptosis is suggested.  Lacrimal glands are prominent.  Her voice is weak and breaks.  There is rhinitis with clear nasal drainage to a moderate degree.  She appears to be edentulous but I had difficulty getting her to open her mouth or follow any commands.  Breath sounds are decreased.  Rhythm is regular.  She has minimal trace edema at the sock line.  Pedal pulses are decreased.  She is wearing an antiwandering bracelet on the left ankle.  Limbs are atrophic.  Strength could not be tested as she would not follow commands.  General appearance:  no acute distress, increased work of breathing is present.   Lymphatic: No lymphadenopathy about the head, neck, axilla. Eyes: No conjunctival inflammation or lid edema is present. There is no scleral icterus. Ears:  External ear exam shows no significant lesions or deformities.   Nose:  External nasal examination shows no deformity or inflammation. Nasal mucosa are pink and moist without lesions, exudates Neck:  No thyromegaly, masses, tenderness noted.    Heart:  No gallop, murmur, click, rub .  Lungs:  without wheezes, rhonchi, rales, rubs. Abdomen: Bowel sounds are normal. Abdomen is soft and nontender with no organomegaly, hernias, masses. GU: Deferred  Extremities:  No cyanosis, clubbing  Neurologic exam :Balance, Rhomberg, finger to nose testing could not be completed due to clinical state Skin: Warm & dry w/o tenting. No significant lesions or rash.  See summary under each active problem in the Problem List with associated updated therapeutic plan

## 2021-09-17 ENCOUNTER — Encounter: Payer: Self-pay | Admitting: Internal Medicine

## 2021-09-17 NOTE — Assessment & Plan Note (Signed)
Current creatinine of 2.46 with a GFR of 18 indicating CKD stage IV.  This is actually an improvement from 6/13 when creatinine was 3.5 and GFR 14.  No nephrotoxic drugs identified and the med list and no change indicated.  Because of her advanced age, comorbidities and dementia; she is not a dialysis candidate.

## 2021-09-17 NOTE — Assessment & Plan Note (Signed)
Because of her dementia she can provide no meaningful history as to angina.

## 2021-09-17 NOTE — Assessment & Plan Note (Signed)
Blood pressure of 155/63 today is the highest on record in the recent past.  Values have ranged from a low of 102/60 up to the present value.  Rarely is the systolic greater than 146.  No change will be made in regimen but monitor will continue.

## 2021-09-17 NOTE — Assessment & Plan Note (Signed)
Anemia stable with H/H of 10.3/32.8 with normochromic, normocytic indices.

## 2021-09-17 NOTE — Assessment & Plan Note (Signed)
She can provide no meaningful history nor follow commands.  She confabulates throughout the exam.  Roommate states that she has conversations with her long deceased parents.  She will sometimes block her roommate from reentering the room but is easily calmed. Anti-wandering bracelet in place.

## 2021-09-23 DIAGNOSIS — Z1159 Encounter for screening for other viral diseases: Secondary | ICD-10-CM | POA: Diagnosis not present

## 2021-09-23 DIAGNOSIS — I129 Hypertensive chronic kidney disease with stage 1 through stage 4 chronic kidney disease, or unspecified chronic kidney disease: Secondary | ICD-10-CM | POA: Diagnosis not present

## 2021-09-30 DIAGNOSIS — I129 Hypertensive chronic kidney disease with stage 1 through stage 4 chronic kidney disease, or unspecified chronic kidney disease: Secondary | ICD-10-CM | POA: Diagnosis not present

## 2021-09-30 DIAGNOSIS — Z8616 Personal history of COVID-19: Secondary | ICD-10-CM | POA: Diagnosis not present

## 2021-09-30 DIAGNOSIS — Z1159 Encounter for screening for other viral diseases: Secondary | ICD-10-CM | POA: Diagnosis not present

## 2021-10-07 DIAGNOSIS — I129 Hypertensive chronic kidney disease with stage 1 through stage 4 chronic kidney disease, or unspecified chronic kidney disease: Secondary | ICD-10-CM | POA: Diagnosis not present

## 2021-10-07 DIAGNOSIS — Z1159 Encounter for screening for other viral diseases: Secondary | ICD-10-CM | POA: Diagnosis not present

## 2021-10-08 ENCOUNTER — Encounter: Payer: Self-pay | Admitting: Adult Health

## 2021-10-08 ENCOUNTER — Non-Acute Institutional Stay (SKILLED_NURSING_FACILITY): Payer: Medicare HMO | Admitting: Adult Health

## 2021-10-08 DIAGNOSIS — F01C0 Vascular dementia, severe, without behavioral disturbance, psychotic disturbance, mood disturbance, and anxiety: Secondary | ICD-10-CM

## 2021-10-08 DIAGNOSIS — I455 Other specified heart block: Secondary | ICD-10-CM

## 2021-10-08 NOTE — Progress Notes (Signed)
Location:  [place] Room Number: 109-D Place of Service:  SNF (31) Provider: Ok Edwards, NP  CODE STATUS: DNR  No Known Allergies  Chief Complaint  Patient presents with   Acute Visit    Fall.    HPI:  She has had several falls over the past several months without injuries. She does get out of bed daily and uses wheelchair. There are no indications of pain present. There are no reports of agitation or anxiety. No reports of insomnia present.   Past Medical History:  Diagnosis Date   Anemia    Arthritis    History of blood transfusion    "today is the first time" (08/05/2014)   Hypertension    Left femoral shaft fracture ([place]) 08/08/2014    Past Surgical History:  Procedure Laterality Date   APPENDECTOMY     CATARACT EXTRACTION Left    CHOLECYSTECTOMY     COLONOSCOPY N/A 09/26/2014   Procedure: COLONOSCOPY;  Surgeon: [name], MD;  [facility];  Service: Endoscopy;  Laterality: N/A;  1030am   CYSTECTOMY     "had cyst taken off lower back"   ESOPHAGOGASTRODUODENOSCOPY N/A 08/22/2014   [id number]:[id number] ring at the gastro junction/small HH   ORIF FEMUR FRACTURE Left 08/07/2014   Procedure: OPEN REDUCTION INTERNAL FIXATION (ORIF)  LEFT FEMUR FRACTURE;  Surgeon: [name], MD;  Location: [place];  Service: Orthopedics;  Laterality: Left;   TONSILLECTOMY      Social History   Socioeconomic History   Marital status: Widowed    Spouse name: Not on file   Number of children: Not on file   Years of education: Not on file   Highest education level: Not on file  Occupational History   Occupation: retired   Tobacco Use   Smoking status: Former    Packs/day: 0.25    Years: 16.00    Pack years: 4.00    Types: Cigarettes   Smokeless tobacco: Never  Vaping Use   Vaping Use: Never used  Substance and Sexual Activity   Alcohol use: No   Drug use: No   Sexual activity: Never  Other Topics Concern   Not on file  Social  History Narrative   Long term resident of [facility]    Social Determinants of Health   Financial Resource Strain: Not on file  Food Insecurity: Not on file  Transportation Needs: Not on file  Physical Activity: Not on file  Stress: Not on file  Social Connections: Not on file  Intimate Partner Violence: Not on file   Family History  Family history unknown: Yes      VITAL SIGNS BP (!) 150/44    Pulse 63    Temp 97.6 F (36.4 C)    Resp 18    Ht 4\' 11"  (1.499 m)    Wt 82 lb 6.4 oz (37.4 kg)    SpO2 99%    BMI 16.64 kg/m   Outpatient Encounter Medications as of 10/08/2021  Medication Sig   acetaminophen (TYLENOL) 325 MG tablet Take 650 mg by mouth every 6 (six) hours as needed.   amLODipine (NORVASC) 5 MG tablet Take 5 mg by mouth daily.    ferrous sulfate 325 (65 FE) MG tablet Take 325 mg by mouth daily with breakfast.    melatonin 5 MG TABS Take 5 mg by mouth at bedtime.   metoprolol tartrate (LOPRESSOR) 25 MG tablet Take 25 mg by mouth 2 (two) times daily.  NON FORMULARY Diet change: Pureed diet   NON FORMULARY Wanderguard tag #[id number] to left ankle for safety awareness. Check placement and function qshift.   Polyethyl Glycol-Propyl Glycol (SYSTANE) 0.4-0.3 % GEL ophthalmic gel Place 1 application into both eyes 2 (two) times daily as needed.   sodium bicarbonate 650 MG tablet Take 650 mg by mouth 2 (two) times daily.   No facility-administered encounter medications on file as of 10/08/2021.     SIGNIFICANT DIAGNOSTIC EXAMS  NO RECENT EXAMS.   LABS REVIEWED PREVIOUS;    12-24-20: wbc 5.8; hgb 11.0; hct 36.1; mcv 87.4 plt 295; glucose 90; bun 43; creat 2.11; k+ 3.7; na++ 136; ca 8.8 GFR 21; liver normal albumin 3.7 tsh 2.216 vit D 44.35 02-15-21: wbc 5.8; hgb 10.0; hct 32.2; mcv 85.4 plt 273; glucose 84; bun 51; creat 3.05; k+ 4.2; na++ 138; ca 9.0; GFR 14  07-28-21: wbc 5.6; hgb 10.3; hct 32.8; mcv 87.2 plt 280; glucose 88; bun 60; creat 2.46; k+ 4.5; na++ 137; ca 8.5 GFR 18 liver  normal albumin 3.4 tsh 1.335 vit D 51.94   NO NEW LABS.    Review of Systems  Unable to perform ROS: Dementia    Physical Exam Constitutional:      General: She is not in acute distress.    Appearance: She is well-developed. She is not diaphoretic.  Neck:     Thyroid: No thyromegaly.  Cardiovascular:     Rate and Rhythm: Normal rate and regular rhythm.     Pulses: Normal pulses.     Heart sounds: Normal heart sounds.  Pulmonary:     Effort: Pulmonary effort is normal. No respiratory distress.     Breath sounds: Normal breath sounds.  Abdominal:     General: Bowel sounds are normal. There is no distension.     Palpations: Abdomen is soft.     Tenderness: There is no abdominal tenderness.  Musculoskeletal:     Cervical back: Neck supple.     Right lower leg: No edema.     Left lower leg: No edema.     Comments:  Is able to move all extremities Uses wheelchair History of left femur ORIF      Lymphadenopathy:     Cervical: No cervical adenopathy.  Skin:    General: Skin is warm and dry.     Comments: Has chronic large cyst on sacral area of lower back        Neurological:     Mental Status: She is alert. Mental status is at baseline.  Psychiatric:        Mood and Affect: Mood normal.     ASSESSMENT/ PLAN:  TODAY  Sinus pause Vascular dementia without behavioral disturbance psychotic disturbance mood disturbance or anxiety:   At this time will not make changes: more than likely the sinus pauses are contributing to her falls; will monitor; due to her advanced and dementia she is not a candidate for aggressive treatment options.    Ok Edwards NP [facility]   call [phone]

## 2021-10-12 DIAGNOSIS — I455 Other specified heart block: Secondary | ICD-10-CM | POA: Insufficient documentation

## 2021-10-14 DIAGNOSIS — Z1159 Encounter for screening for other viral diseases: Secondary | ICD-10-CM | POA: Diagnosis not present

## 2021-10-14 DIAGNOSIS — I129 Hypertensive chronic kidney disease with stage 1 through stage 4 chronic kidney disease, or unspecified chronic kidney disease: Secondary | ICD-10-CM | POA: Diagnosis not present

## 2021-10-19 ENCOUNTER — Encounter: Payer: Self-pay | Admitting: Adult Health

## 2021-10-19 ENCOUNTER — Non-Acute Institutional Stay (SKILLED_NURSING_FACILITY): Payer: Medicare HMO | Admitting: Adult Health

## 2021-10-19 DIAGNOSIS — Z85038 Personal history of other malignant neoplasm of large intestine: Secondary | ICD-10-CM

## 2021-10-19 DIAGNOSIS — F01C0 Vascular dementia, severe, without behavioral disturbance, psychotic disturbance, mood disturbance, and anxiety: Secondary | ICD-10-CM | POA: Diagnosis not present

## 2021-10-19 DIAGNOSIS — K5904 Chronic idiopathic constipation: Secondary | ICD-10-CM

## 2021-10-19 NOTE — Progress Notes (Signed)
Location:  [place] Room Number: 109 Place of Service:  SNF (31)   CODE STATUS: dnr   No Known Allergies  Chief Complaint  Patient presents with   Medical Management of Chronic Issues            History of colon cancer:    Dementia without behavioral disturbance unspecified dementia type:   Chronic idiopathic constipation:    HPI:  She is a 88 year old long term resident of this facility being seen for the management of her chronic illnesses: History of colon cancer:    Dementia without behavioral disturbance unspecified dementia type:   Chronic idiopathic constipation:. There are no reports of uncontrolled pain. She does eat slowly and is gradually losing weight. No reports of anxiety present.   Past Medical History:  Diagnosis Date   Anemia    Arthritis    History of blood transfusion    "today is the first time" (08/05/2014)   Hypertension    Left femoral shaft fracture ([place]) 08/08/2014    Past Surgical History:  Procedure Laterality Date   APPENDECTOMY     CATARACT EXTRACTION Left    CHOLECYSTECTOMY     COLONOSCOPY N/A 09/26/2014   Procedure: COLONOSCOPY;  Surgeon: [name], MD;  [facility];  Service: Endoscopy;  Laterality: N/A;  1030am   CYSTECTOMY     "had cyst taken off lower back"   ESOPHAGOGASTRODUODENOSCOPY N/A 08/22/2014   QBH:[id number] ring at the gastro junction/small HH   ORIF FEMUR FRACTURE Left 08/07/2014   Procedure: OPEN REDUCTION INTERNAL FIXATION (ORIF)  LEFT FEMUR FRACTURE;  Surgeon: [name], MD;  Location: [place];  Service: Orthopedics;  Laterality: Left;   TONSILLECTOMY      Social History   Socioeconomic History   Marital status: Widowed    Spouse name: Not on file   Number of children: Not on file   Years of education: Not on file   Highest education level: Not on file  Occupational History   Occupation: retired   Tobacco Use   Smoking status: Former    Packs/day: 0.25    Years:  16.00    Pack years: 4.00    Types: Cigarettes   Smokeless tobacco: Never  Vaping Use   Vaping Use: Never used  Substance and Sexual Activity   Alcohol use: No   Drug use: No   Sexual activity: Never  Other Topics Concern   Not on file  Social History Narrative   Long term resident of [facility]    Social Determinants of Health   Financial Resource Strain: Not on file  Food Insecurity: Not on file  Transportation Needs: Not on file  Physical Activity: Not on file  Stress: Not on file  Social Connections: Not on file  Intimate Partner Violence: Not on file   Family History  Family history unknown: Yes      VITAL SIGNS BP (!) 129/53    Pulse 75    Temp (!) 96.8 F (36 C)    Ht 4\' 11"  (1.499 m)    Wt 83 lb (37.6 kg)    BMI 16.76 kg/m   Outpatient Encounter Medications as of 10/19/2021  Medication Sig   acetaminophen (TYLENOL) 325 MG tablet Take 650 mg by mouth every 6 (six) hours as needed.   amLODipine (NORVASC) 5 MG tablet Take 5 mg by mouth daily.    ferrous sulfate 325 (65 FE) MG tablet Take 325 mg  by mouth daily with breakfast.    melatonin 5 MG TABS Take 5 mg by mouth at bedtime.   metoprolol tartrate (LOPRESSOR) 25 MG tablet Take 25 mg by mouth 2 (two) times daily.   NON FORMULARY Diet change: Pureed diet   NON FORMULARY Wanderguard tag #[id number] to left ankle for safety awareness. Check placement and function qshift.   Polyethyl Glycol-Propyl Glycol (SYSTANE) 0.4-0.3 % GEL ophthalmic gel Place 1 application into both eyes 2 (two) times daily as needed.   sodium bicarbonate 650 MG tablet Take 650 mg by mouth 2 (two) times daily.   No facility-administered encounter medications on file as of 10/19/2021.     SIGNIFICANT DIAGNOSTIC EXAMS   NO RECENT EXAMS.    LABS REVIEWED PREVIOUS;    12-24-20: wbc 5.8; hgb 11.0; hct 36.1; mcv 87.4 plt 295; glucose 90; bun 43; creat 2.11; k+ 3.7; na++ 136; ca 8.8 GFR 21; liver normal albumin 3.7 tsh 2.216 vit D 44.35 02-15-21: wbc 5.8;  hgb 10.0; hct 32.2; mcv 85.4 plt 273; glucose 84; bun 51; creat 3.05; k+ 4.2; na++ 138; ca 9.0; GFR 14  07-28-21: wbc 5.6; hgb 10.3; hct 32.8; mcv 87.2 plt 280; glucose 88; bun 60; creat 2.46; k+ 4.5; na++ 137; ca 8.5 GFR 18 liver normal albumin 3.4 tsh 1.335 vit D 51.94   NO NEW LABS.    Review of Systems  Unable to perform ROS: Dementia    Physical Exam Constitutional:      General: She is not in acute distress.    Appearance: She is well-developed. She is not diaphoretic.  Neck:     Thyroid: No thyromegaly.  Cardiovascular:     Rate and Rhythm: Normal rate and regular rhythm.     Pulses: Normal pulses.     Heart sounds: Normal heart sounds.  Pulmonary:     Effort: Pulmonary effort is normal. No respiratory distress.     Breath sounds: Normal breath sounds.  Abdominal:     General: Bowel sounds are normal. There is no distension.     Palpations: Abdomen is soft.     Tenderness: There is no abdominal tenderness.  Musculoskeletal:     Cervical back: Neck supple.     Right lower leg: No edema.     Left lower leg: No edema.     Comments:  Is able to move all extremities Uses wheelchair History of left femur ORIF       Lymphadenopathy:     Cervical: No cervical adenopathy.  Skin:    General: Skin is warm and dry.     Comments:  Has chronic large cyst on sacral area of lower back         Neurological:     Mental Status: She is alert. Mental status is at baseline.  Psychiatric:        Mood and Affect: Mood normal.     ASSESSMENT/ PLAN:  TODAY:   History of colon cancer: will monitor   2. Dementia without behavioral disturbance unspecified dementia type: is slowly losing weight current weight is 83 pounds will monitor   3. Chronic idiopathic constipation: is off medications.    PREVIOUS   4. Unspecified iron deficiency anemia: is stable hgb 10.3; will continue iron daily  5. Vitamin D deficiency: is stable 51.94 will monitor  6. Left femoral shaft fracture :  history left femur ORIF: is stable  7. Failure to thrive in adult: weight is 83  pounds supplements as directed.  8. Aortic atherosclerosis (ct 05-25-19) will monitor  9. CKD (chronic kidney disease) stage IV bun 60 creat 2.46 GFR 18 will monitor   10. Benign hypertension with CKD (Chronic kidney disease) stage IV: is stable b/p 129/53 will continue lopressor 50 mg twice daily and norvasc 5 mg daily     Ok Edwards NP [facility]   [phone]

## 2021-10-21 DIAGNOSIS — Z1159 Encounter for screening for other viral diseases: Secondary | ICD-10-CM | POA: Diagnosis not present

## 2021-10-21 DIAGNOSIS — I129 Hypertensive chronic kidney disease with stage 1 through stage 4 chronic kidney disease, or unspecified chronic kidney disease: Secondary | ICD-10-CM | POA: Diagnosis not present

## 2021-10-28 DIAGNOSIS — I129 Hypertensive chronic kidney disease with stage 1 through stage 4 chronic kidney disease, or unspecified chronic kidney disease: Secondary | ICD-10-CM | POA: Diagnosis not present

## 2021-10-28 DIAGNOSIS — Z1159 Encounter for screening for other viral diseases: Secondary | ICD-10-CM | POA: Diagnosis not present

## 2021-11-04 DIAGNOSIS — Z1159 Encounter for screening for other viral diseases: Secondary | ICD-10-CM | POA: Diagnosis not present

## 2021-11-04 DIAGNOSIS — I129 Hypertensive chronic kidney disease with stage 1 through stage 4 chronic kidney disease, or unspecified chronic kidney disease: Secondary | ICD-10-CM | POA: Diagnosis not present

## 2021-11-05 ENCOUNTER — Encounter: Payer: Self-pay | Admitting: Adult Health

## 2021-11-05 ENCOUNTER — Non-Acute Institutional Stay (SKILLED_NURSING_FACILITY): Payer: Medicare HMO | Admitting: Adult Health

## 2021-11-05 DIAGNOSIS — I700 Atherosclerosis of aorta: Secondary | ICD-10-CM | POA: Diagnosis not present

## 2021-11-05 DIAGNOSIS — F01C0 Vascular dementia, severe, without behavioral disturbance, psychotic disturbance, mood disturbance, and anxiety: Secondary | ICD-10-CM | POA: Diagnosis not present

## 2021-11-05 DIAGNOSIS — N184 Chronic kidney disease, stage 4 (severe): Secondary | ICD-10-CM | POA: Diagnosis not present

## 2021-11-05 NOTE — Progress Notes (Signed)
?Location:  [place] ?Nursing Home Room Number: 109-D ?Place of Service:  SNF (31) ? ? ?CODE STATUS: DNR ? ?No Known Allergies ? ?Chief Complaint  ?Patient presents with  ? Acute Visit  ?  Care plan meeting  ? ? ?HPI: ? ?We have come together for her care plan meeting. . No BIMS; no mood. She requires limited to extensive assist with her adl care. She is frequently incontinent of bladder and bowel. She is nonambulatory has had one fall without injury. Dietary:  puree diet fair appetite weight is 83 pounds down 1.7 pounds in the past 3 months feeds herself. Therapy none at this time.  She continues to be followed for her chronic illnesses including: Aortic atherosclerosis Severe dementia without behavioral disturbance; psychotic disturbance; mood disturbance; anxiety Chronic kidney disease (CKD) stage IV ? ?Past Medical History:  ?Diagnosis Date  ? Anemia   ? Arthritis   ? History of blood transfusion   ? "today is the first time" (08/05/2014)  ? Hypertension   ? Left femoral shaft fracture ([place]) 08/08/2014  ? ? ?Past Surgical History:  ?Procedure Laterality Date  ? APPENDECTOMY    ? CATARACT EXTRACTION Left   ? CHOLECYSTECTOMY    ? COLONOSCOPY N/A 09/26/2014  ? Procedure: COLONOSCOPY;  Surgeon: [name], MD;  [facility];  Service: Endoscopy;  Laterality: N/A;  1030am  ? CYSTECTOMY    ? "had cyst taken off lower back"  ? ESOPHAGOGASTRODUODENOSCOPY N/A 08/22/2014  ? ZOX:[id number] ring at the gastro junction/small HH  ? ORIF FEMUR FRACTURE Left 08/07/2014  ? Procedure: OPEN REDUCTION INTERNAL FIXATION (ORIF)  LEFT FEMUR FRACTURE;  Surgeon: [name], MD;  Location: [place];  Service: Orthopedics;  Laterality: Left;  ? TONSILLECTOMY    ? ? ?Social History  ? ?Socioeconomic History  ? Marital status: Widowed  ?  Spouse name: Not on file  ? Number of children: Not on file  ? Years of education: Not on file  ? Highest education level: Not on file  ?Occupational History  ? Occupation: retired    ?Tobacco Use  ? Smoking status: Former  ?  Packs/day: 0.25  ?  Years: 16.00  ?  Pack years: 4.00  ?  Types: Cigarettes  ? Smokeless tobacco: Never  ?Vaping Use  ? Vaping Use: Never used  ?Substance and Sexual Activity  ? Alcohol use: No  ? Drug use: No  ? Sexual activity: Never  ?Other Topics Concern  ? Not on file  ?Social History Narrative  ? Long term resident of [facility]   ? ?Social Determinants of Health  ? ?Financial Resource Strain: Not on file  ?Food Insecurity: Not on file  ?Transportation Needs: Not on file  ?Physical Activity: Not on file  ?Stress: Not on file  ?Social Connections: Not on file  ?Intimate Partner Violence: Not on file  ? ?Family History  ?Family history unknown: Yes  ? ? ? ? ?VITAL SIGNS ?BP (!) 100/56   Pulse 68   Temp (!) 97.1 ?F (36.2 ?C)   Resp 16   Ht 4\' 11"  (1.499 m)   Wt 82 lb 9.6 oz (37.5 kg)   BMI 16.68 kg/m?  ? ?Outpatient Encounter Medications as of 11/05/2021  ?Medication Sig  ? acetaminophen (TYLENOL) 325 MG tablet Take 650 mg by mouth every 6 (six) hours as needed.  ? amLODipine (NORVASC) 5 MG tablet Take 5 mg by mouth daily.   ? ferrous sulfate 325 (65 FE)  MG tablet Take 325 mg by mouth daily with breakfast.   ? melatonin 5 MG TABS Take 5 mg by mouth at bedtime.  ? metoprolol tartrate (LOPRESSOR) 25 MG tablet Take 25 mg by mouth 2 (two) times daily.  ? NON FORMULARY Diet change: Pureed diet  ? NON FORMULARY Wanderguard tag [phone] to left ankle for safety awareness. Check placement and function qshift.  ? Nutritional Supplements (ENSURE ENLIVE PO) Take by mouth. BID 120 ml orally with medpass- to support overall protein/energy intake. ?Twice A Day Between Meals  ? Polyethyl Glycol-Propyl Glycol (SYSTANE) 0.4-0.3 % GEL ophthalmic gel Place 1 application into both eyes 2 (two) times daily as needed.  ? sodium bicarbonate 650 MG tablet Take 650 mg by mouth 2 (two) times daily.  ? ?No facility-administered encounter medications on file as of 11/05/2021.  ? ? ? ?SIGNIFICANT  DIAGNOSTIC EXAMS ? ?NO RECENT EXAMS.  ? ? ?LABS REVIEWED PREVIOUS;  ?  ?12-24-20: wbc 5.8; hgb 11.0; hct 36.1; mcv 87.4 plt 295; glucose 90; bun 43; creat 2.11; k+ 3.7; na++ 136; ca 8.8 GFR 21; liver normal albumin 3.7 tsh 2.216 vit D 44.35 ?02-15-21: wbc 5.8; hgb 10.0; hct 32.2; mcv 85.4 plt 273; glucose 84; bun 51; creat 3.05; k+ 4.2; na++ 138; ca 9.0; GFR 14  ?07-28-21: wbc 5.6; hgb 10.3; hct 32.8; mcv 87.2 plt 280; glucose 88; bun 60; creat 2.46; k+ 4.5; na++ 137; ca 8.5 GFR 18 liver normal albumin 3.4 tsh 1.335 vit D 51.94  ? ?NO NEW LABS.   ? ?Review of Systems  ?Unable to perform ROS: Dementia  ? ? ?Physical Exam ?Constitutional:   ?   General: She is not in acute distress. ?   Appearance: She is well-developed. She is not diaphoretic.  ?Neck:  ?   Thyroid: No thyromegaly.  ?Cardiovascular:  ?   Rate and Rhythm: Normal rate and regular rhythm.  ?   Pulses: Normal pulses.  ?   Heart sounds: Normal heart sounds.  ?Pulmonary:  ?   Effort: Pulmonary effort is normal. No respiratory distress.  ?   Breath sounds: Normal breath sounds.  ?Abdominal:  ?   General: Bowel sounds are normal. There is no distension.  ?   Palpations: Abdomen is soft.  ?   Tenderness: There is no abdominal tenderness.  ?Musculoskeletal:  ?   Cervical back: Neck supple.  ?   Right lower leg: No edema.  ?   Left lower leg: No edema.  ?   Comments:  Is able to move all extremities ?Uses wheelchair ?History of left femur ORIF        ?Lymphadenopathy:  ?   Cervical: No cervical adenopathy.  ?Skin: ?   General: Skin is warm and dry.  ?   Comments:  Has chronic large cyst on sacral area of lower back          ?Neurological:  ?   Mental Status: She is alert. Mental status is at baseline.  ?Psychiatric:     ?   Mood and Affect: Mood normal.  ? ? ? ?ASSESSMENT/ PLAN: ? ?TODAY ? ?Aortic atherosclerosis ?Severe dementia without behavioral disturbance; psychotic disturbance; mood disturbance; anxiety ?Chronic kidney disease (CKD) stage IV ? ?Will continue  current medications ?Will continue current plan of care ?Will continue to monitor her status.  ? ? ?Time with patient: 40 minutes: medications plan of care.  ? ? ?Ok Edwards NP ?[facility]  ?call [phone]  ? ?

## 2021-11-26 ENCOUNTER — Non-Acute Institutional Stay (SKILLED_NURSING_FACILITY): Payer: Medicare HMO | Admitting: Adult Health

## 2021-11-26 ENCOUNTER — Encounter: Payer: Self-pay | Admitting: Adult Health

## 2021-11-26 DIAGNOSIS — D649 Anemia, unspecified: Secondary | ICD-10-CM | POA: Diagnosis not present

## 2021-11-26 DIAGNOSIS — R627 Adult failure to thrive: Secondary | ICD-10-CM | POA: Diagnosis not present

## 2021-11-26 DIAGNOSIS — N184 Chronic kidney disease, stage 4 (severe): Secondary | ICD-10-CM | POA: Diagnosis not present

## 2021-11-26 DIAGNOSIS — I700 Atherosclerosis of aorta: Secondary | ICD-10-CM | POA: Diagnosis not present

## 2021-11-26 DIAGNOSIS — K5904 Chronic idiopathic constipation: Secondary | ICD-10-CM

## 2021-11-26 DIAGNOSIS — F01C0 Vascular dementia, severe, without behavioral disturbance, psychotic disturbance, mood disturbance, and anxiety: Secondary | ICD-10-CM | POA: Diagnosis not present

## 2021-11-26 DIAGNOSIS — Z85038 Personal history of other malignant neoplasm of large intestine: Secondary | ICD-10-CM

## 2021-11-26 DIAGNOSIS — I129 Hypertensive chronic kidney disease with stage 1 through stage 4 chronic kidney disease, or unspecified chronic kidney disease: Secondary | ICD-10-CM

## 2021-11-26 NOTE — Progress Notes (Signed)
? ? ?Provider:Emmerson Shuffield NP  ?Location  [facility]  ? ?PCP: Gerlene Fee, NP ? ? ?Extended Emergency Contact Information ?Primary Emergency Contact: Graves,Pearlie ? [place] ?Home Phone: [phone] ?Mobile Phone: [phone] ?Relation: Niece ?Secondary Emergency Contact: Marcellus,Etta ? [place] ?Home Phone: [phone] ?Relation: Neighbor ? ?Codes status: dnr  ?Goals of care: advanced directive information ? ?  11/26/2021  ?  9:34 AM  ?Advanced Directives  ?Does Patient Have a Medical Advance Directive? Yes  ?Type of Paramedic of [place];Out of facility DNR (pink MOST or yellow form)  ?Does patient want to make changes to medical advance directive? No - Patient declined  ?Copy of [place] in Chart? Yes - validated most recent copy scanned in chart (See row information)  ?Pre-existing out of facility DNR order (yellow form or pink MOST form) Yellow form placed in chart (order not valid for inpatient use)  ? ? ? ?No Known Allergies ? ?Chief Complaint  ?Patient presents with  ? Annual Exam  ? ? ?HPI ? ?She is a 88 year old long term resident being seen for her annual exam. She has had no acuate illnesses; no hospitalizations no visits to the ED. She continues to slowly lose weight. She does get out of bed daily; propels self in the facility. There are no reports of agitation; no reports of anxiety. She continues to be followed for her chronic illnesses including: Benign hypertension with CKD (chronic kidney disease) stage IV: History of breast cancer;  Dementia without behavioral disturbance unspecified dementia type:  Chronic idiopathic constipation:  ? ? ?Past Medical History:  ?Diagnosis Date  ? Anemia   ? Arthritis   ? History of blood transfusion   ? "today is the first time" (08/05/2014)  ? Hypertension   ? Left femoral shaft fracture ([place]) 08/08/2014  ? ?Past Surgical History:  ?Procedure Laterality Date  ?  APPENDECTOMY    ? CATARACT EXTRACTION Left   ? CHOLECYSTECTOMY    ? COLONOSCOPY N/A 09/26/2014  ? Procedure: COLONOSCOPY;  Surgeon: [name], MD;  [facility];  Service: Endoscopy;  Laterality: N/A;  1030am  ? CYSTECTOMY    ? "had cyst taken off lower back"  ? ESOPHAGOGASTRODUODENOSCOPY N/A 08/22/2014  ? AGT:[id number] ring at the gastro junction/small HH  ? ORIF FEMUR FRACTURE Left 08/07/2014  ? Procedure: OPEN REDUCTION INTERNAL FIXATION (ORIF)  LEFT FEMUR FRACTURE;  Surgeon: [name], MD;  Location: [place];  Service: Orthopedics;  Laterality: Left;  ? TONSILLECTOMY    ? ? reports that she has quit smoking. Her smoking use included cigarettes. She has a 4.00 pack-year smoking history. She has never used smokeless tobacco. She reports that she does not drink alcohol and does not use drugs. ?Social History  ? ?Tobacco Use  ? Smoking status: Former  ?  Packs/day: 0.25  ?  Years: 16.00  ?  Pack years: 4.00  ?  Types: Cigarettes  ? Smokeless tobacco: Never  ?Vaping Use  ? Vaping Use: Never used  ?Substance Use Topics  ? Alcohol use: No  ? Drug use: No  ? ?Family History  ?Family history unknown: Yes  ? ? ?Pertinent  Health Maintenance Due  ?Topic Date Due  ? INFLUENZA VACCINE  Completed  ? DEXA SCAN  Completed  ? ? ?  05/25/2019  ?  7:50 AM 07/31/2019  ?  1:46 PM 11/29/2019  ?  1:03 PM 11/30/2020  ?  11:45 AM 10/19/2021  ?  1:23 PM  ?Fall Risk  ?Falls in the past year?  1 0 1 1  ?Was there an injury with Fall?  0  0 0  ?Fall Risk Category Calculator  '1  1 2  '$ ?Fall Risk Category  Low  Low Moderate  ?Patient Fall Risk Level High fall risk   Low fall risk Moderate fall risk  ?Patient at Risk for Falls Due to  Impaired balance/gait;Impaired mobility  History of fall(s);Impaired balance/gait;Impaired mobility History of fall(s);Impaired balance/gait;Impaired mobility  ?Fall risk Follow up    Falls evaluation completed   ? ? ?  10/19/2021  ?  1:22 PM 11/30/2020  ? 11:45 AM 11/29/2019  ?  1:03 PM 07/31/2019  ?   1:46 PM 06/12/2018  ? 10:37 AM  ?Depression screen PHQ 2/9  ?Decreased Interest 0 0 0 0 0  ?Down, Depressed, Hopeless 0 0 0 0 0  ?PHQ - 2 Score 0 0 0 0 0  ?Altered sleeping 0      ?Tired, decreased energy 0      ?Change in appetite 0      ?Feeling bad or failure about yourself  0      ?Trouble concentrating 0      ?Moving slowly or fidgety/restless 0      ?Suicidal thoughts 0      ?PHQ-9 Score 0      ? ? ?Functional Status Survey: ?  ? ?Outpatient Encounter Medications as of 11/26/2021  ?Medication Sig  ? acetaminophen (TYLENOL) 325 MG tablet Take 650 mg by mouth every 6 (six) hours as needed.  ? amLODipine (NORVASC) 5 MG tablet Take 5 mg by mouth daily.   ? ferrous sulfate 325 (65 FE) MG tablet Take 325 mg by mouth daily with breakfast.   ? melatonin 5 MG TABS Take 5 mg by mouth at bedtime.  ? metoprolol tartrate (LOPRESSOR) 25 MG tablet Take 25 mg by mouth 2 (two) times daily.  ? NON FORMULARY Diet change: Pureed diet  ? NON FORMULARY Wanderguard tag [phone] to left ankle for safety awareness. Check placement and function qshift.  ? Nutritional Supplements (ENSURE ENLIVE PO) Take by mouth. BID 120 ml orally with medpass- to support overall protein/energy intake. ?Twice A Day Between Meals  ? Polyethyl Glycol-Propyl Glycol (SYSTANE) 0.4-0.3 % GEL ophthalmic gel Place 1 application into both eyes 2 (two) times daily as needed.  ? sodium bicarbonate 650 MG tablet Take 650 mg by mouth 2 (two) times daily.  ? ?No facility-administered encounter medications on file as of 11/26/2021.  ? ? ? ?Vitals:  ? 11/26/21 0925  ?BP: 140/70  ?Pulse: 78  ?Resp: (!) 22  ?Temp: (!) 97.3 ?F (36.3 ?C)  ?Weight: 82 lb 9.6 oz (37.5 kg)  ?Height: '4\' 11"'$  (1.499 m)  ? ?Body mass index is 16.68 kg/m?. ? ?  ?NO RECENT EXAMS.  ? ? ?LABS REVIEWED PREVIOUS;  ?  ?12-24-20: wbc 5.8; hgb 11.0; hct 36.1; mcv 87.4 plt 295; glucose 90; bun 43; creat 2.11; k+ 3.7; na++ 136; ca 8.8 GFR 21; liver normal albumin 3.7 tsh 2.216 vit D 44.35 ?02-15-21: wbc 5.8; hgb  10.0; hct 32.2; mcv 85.4 plt 273; glucose 84; bun 51; creat 3.05; k+ 4.2; na++ 138; ca 9.0; GFR 14  ?07-28-21: wbc 5.6; hgb 10.3; hct 32.8; mcv 87.2 plt 280; glucose 88; bun 60; creat 2.46; k+ 4.5; na++ 137; ca 8.5 GFR 18 liver normal albumin 3.4 tsh 1.335 vit D  51.94  ? ?NO NEW LABS.   ? ?Review of Systems  ?Unable to perform ROS: Dementia  ? ?Physical Exam ?Constitutional:   ?   General: She is not in acute distress. ?   Appearance: She is underweight. She is not diaphoretic.  ?HENT:  ?   Nose: Nose normal.  ?   Mouth/Throat:  ?   Mouth: Mucous membranes are moist.  ?   Pharynx: Oropharynx is clear.  ?Eyes:  ?   Conjunctiva/sclera: Conjunctivae normal.  ?Neck:  ?   Thyroid: No thyromegaly.  ?Cardiovascular:  ?   Rate and Rhythm: Normal rate and regular rhythm.  ?   Pulses: Normal pulses.  ?   Heart sounds: Normal heart sounds.  ?Pulmonary:  ?   Effort: Pulmonary effort is normal. No respiratory distress.  ?   Breath sounds: Normal breath sounds.  ?Abdominal:  ?   General: Bowel sounds are normal. There is no distension.  ?   Palpations: Abdomen is soft.  ?   Tenderness: There is no abdominal tenderness.  ?Musculoskeletal:  ?   Cervical back: Neck supple.  ?   Right lower leg: No edema.  ?   Left lower leg: No edema.  ?   Comments:  Is able to move all extremities ?Uses wheelchair ?History of left femur ORIF         ?Lymphadenopathy:  ?   Cervical: No cervical adenopathy.  ?Skin: ?   General: Skin is warm and dry.  ?   Comments:  Has chronic large cyst on sacral area of lower back           ?Neurological:  ?   Mental Status: She is alert. Mental status is at baseline.  ?Psychiatric:     ?   Mood and Affect: Mood normal.  ? ?  ? ?ASSESSMENT/ PLAN: ? ?TODAY:  ? ?Unspecified iron deficiency anemia: stable hgb 10.3; will continue iron daily  ? ?2. Vitamin D deficiency level is 51.94 will monitor ? ?3.  Failure to thrive in adult: weight is 82 pounds; will continue supplements as directed  ? ?4. Aortic atherosclerosis:  (ct 05-25-19) will monitor ? ?5. CKD (chronic kidney disease) stage IV  bun 60; creat 2.46 GFR 18 will monitor  ? ?6. Benign hypertension with CKD (chronic kidney disease) stage IV: is stable b/p 140/70: will co

## 2021-12-02 NOTE — Addendum Note (Signed)
Addended by: Gerlene Fee on: 12/02/2021 10:21 AM ? ? Modules accepted: Level of Service ? ?

## 2021-12-03 ENCOUNTER — Encounter: Payer: Self-pay | Admitting: Adult Health

## 2021-12-03 ENCOUNTER — Non-Acute Institutional Stay (SKILLED_NURSING_FACILITY): Payer: Medicare HMO | Admitting: Adult Health

## 2021-12-03 DIAGNOSIS — Z0000 Encounter for general adult medical examination without abnormal findings: Secondary | ICD-10-CM

## 2021-12-03 NOTE — Progress Notes (Signed)
? ?Subjective:  ? Alicia Guerra is a 88 y.o. female who presents for Medicare Annual (Subsequent) preventive examination. ? ?Review of Systems    ?Review of Systems  ?Unable to perform ROS: Dementia  ? ?Cardiac Risk Factors include: advanced age (>63mn, >>21women);hypertension;sedentary lifestyle ? ?   ?Objective:  ?  ?Today's Vitals  ? 12/03/21 1159  ?BP: 112/62  ?Pulse: 68  ?Resp: 18  ?Temp: (!) 97.4 ?F (36.3 ?C)  ?Weight: 82 lb 9.6 oz (37.5 kg)  ?Height: '4\' 11"'$  (1.499 m)  ? ?Body mass index is 16.68 kg/m?. ? ? ?  11/26/2021  ?  9:34 AM 11/05/2021  ?  9:55 AM 10/08/2021  ? 10:43 AM 08/16/2021  ?  8:56 AM 08/13/2021  ? 10:32 AM 06/03/2021  ? 11:57 AM 05/20/2021  ?  9:47 AM  ?Advanced Directives  ?Does Patient Have a Medical Advance Directive? Yes Yes Yes Yes Yes Yes Yes  ?Type of AParamedicof AStrangOut of facility DNR (pink MOST or yellow form) HRandolphOut of facility DNR (pink MOST or yellow form) [place]MarbleOut of facility DNR (pink MOST or yellow form) HNorth HornellOut of facility DNR (pink MOST or yellow form) HThermalitoOut of facility DNR (pink MOST or yellow form) Out of facility DNR (pink MOST or yellow form) Out of facility DNR (pink MOST or yellow form)  ?Does patient want to make changes to medical advance directive? No - Patient declined No - Patient declined No - Patient declined No - Patient declined No - Patient declined No - Patient declined No - Patient declined  ?Copy of HElyin Chart? Yes - validated most recent copy scanned in chart (See row information) Yes - validated most recent copy scanned in chart (See row information) Yes - validated most recent copy scanned in chart (See row information) Yes - validated most recent copy scanned in chart (See row information) Yes - validated most recent copy scanned in chart (See row information)    ?Pre-existing out of facility DNR  order (yellow form or pink MOST form) Yellow form placed in chart (order not valid for inpatient use) Yellow form placed in chart (order not valid for inpatient use)  Yellow form placed in chart (order not valid for inpatient use) Yellow form placed in chart (order not valid for inpatient use) Yellow form placed in chart (order not valid for inpatient use)   ? ? ?Current Medications (verified) ?Outpatient Encounter Medications as of 12/03/2021  ?Medication Sig  ? acetaminophen (TYLENOL) 325 MG tablet Take 650 mg by mouth every 6 (six) hours as needed.  ? amLODipine (NORVASC) 5 MG tablet Take 5 mg by mouth daily.   ? ferrous sulfate 325 (65 FE) MG tablet Take 325 mg by mouth daily with breakfast.   ? melatonin 5 MG TABS Take 5 mg by mouth at bedtime.  ? metoprolol tartrate (LOPRESSOR) 25 MG tablet Take 25 mg by mouth 2 (two) times daily.  ? NON FORMULARY Diet change: Pureed diet  ? NON FORMULARY Wanderguard tag #[phone] left ankle for safety awareness. Check placement and function qshift.  ? Nutritional Supplements (ENSURE ENLIVE PO) Take by mouth. BID 120 ml orally with medpass- to support overall protein/energy intake. ?Twice A Day Between Meals  ? Polyethyl Glycol-Propyl Glycol (SYSTANE) 0.4-0.3 % GEL ophthalmic gel Place 1 application into both eyes 2 (two) times daily as needed.  ? sodium bicarbonate 650  MG tablet Take 650 mg by mouth 2 (two) times daily.  ? ?No facility-administered encounter medications on file as of 12/03/2021.  ? ? ?Allergies (verified) ?Patient has no known allergies.  ? ?History: ?Past Medical History:  ?Diagnosis Date  ? Anemia   ? Arthritis   ? History of blood transfusion   ? "today is the first time" (08/05/2014)  ? Hypertension   ? Left femoral shaft fracture ([place]) 08/08/2014  ? ?Past Surgical History:  ?Procedure Laterality Date  ? APPENDECTOMY    ? CATARACT EXTRACTION Left   ? CHOLECYSTECTOMY    ? COLONOSCOPY N/A 09/26/2014  ? Procedure: COLONOSCOPY;  Surgeon: [name], MD;   [facility];  Service: Endoscopy;  Laterality: N/A;  1030am  ? CYSTECTOMY    ? "had cyst taken off lower back"  ? ESOPHAGOGASTRODUODENOSCOPY N/A 08/22/2014  ? MLY:[id number] ring at the gastro junction/small HH  ? ORIF FEMUR FRACTURE Left 08/07/2014  ? Procedure: OPEN REDUCTION INTERNAL FIXATION (ORIF)  LEFT FEMUR FRACTURE;  Surgeon: [name], MD;  Location: [place];  Service: Orthopedics;  Laterality: Left;  ? TONSILLECTOMY    ? ?Family History  ?Family history unknown: Yes  ? ?Social History  ? ?Socioeconomic History  ? Marital status: Widowed  ?  Spouse name: Not on file  ? Number of children: Not on file  ? Years of education: Not on file  ? Highest education level: Not on file  ?Occupational History  ? Occupation: retired   ?Tobacco Use  ? Smoking status: Former  ?  Packs/day: 0.25  ?  Years: 16.00  ?  Pack years: 4.00  ?  Types: Cigarettes  ? Smokeless tobacco: Never  ?Vaping Use  ? Vaping Use: Never used  ?Substance and Sexual Activity  ? Alcohol use: No  ? Drug use: No  ? Sexual activity: Never  ?Other Topics Concern  ? Not on file  ?Social History Narrative  ? Long term resident of [facility]   ? ?Social Determinants of Health  ? ?Financial Resource Strain: Not on file  ?Food Insecurity: Not on file  ?Transportation Needs: Not on file  ?Physical Activity: Not on file  ?Stress: Not on file  ?Social Connections: Not on file  ? ? ?Tobacco Counseling ?Counseling given: Not Answered ? ? ?Clinical Intake: ? ?Pre-visit preparation completed: Yes ? ?Pain : No/denies pain ? ?  ? ?BMI - recorded: 16.68 ?Nutritional Status: BMI <19  Underweight ?Nutritional Risks: Unintentional weight loss, Failure to thrive ?Diabetes: No ? ?How often do you need to have someone help you when you read instructions, pamphlets, or other written materials from your doctor or pharmacy?: 5 - Always ? ?Diabetic?no ? ?Interpreter Needed?: No ? ?  ? ? ?Activities of Daily Living ? ?  12/03/2021  ? 12:14 PM  ?In your present state of  health, do you have any difficulty performing the following activities:  ?Hearing? 1  ?Vision? 0  ?Difficulty concentrating or making decisions? 1  ?Walking or climbing stairs? 1  ?Dressing or bathing? 1  ?Doing errands, shopping? 1  ?Preparing Food and eating ? Y  ?Using the Toilet? Y  ?In the past six months, have you accidently leaked urine? Y  ?Do you have problems with loss of bowel control? Y  ?Managing your Medications? Y  ?Managing your Finances? Y  ?Housekeeping or managing your Housekeeping? Y  ? ? ?Patient Care Team: ?Gerlene Fee, NP as PCP - General (Geriatric Medicine) ?Fields, [facility]  L, MD (Inactive) as Consulting Physician (Gastroenterology) ?[place] ([place]) ? ?Indicate any recent Medical Services you may have received from other than [facility] providers in the past year (date may be approximate). ? ?   ?Assessment:  ? This is a routine wellness examination for Alicia Guerra. ? ?Hearing/Vision screen ?No results found. ? ?Dietary issues and exercise activities discussed: ?Current Exercise Habits: The patient does not participate in regular exercise at present ? ? Goals Addressed   ? ?  ?  ?  ?  ? This Visit's Progress  ?  Absence of Fall and Fall-Related Injury   On track  ?  Evidence-based guidance:  ?Assess fall risk using a validated tool when available. Consider balance and gait impairment, muscle weakness, diminished vision or hearing, environmental hazards, presence of urinary or bowel urgency and/or incontinence.  ?Communicate fall injury risk to interprofessional healthcare team.  ?Develop a fall prevention plan with the patient and family.  ?Promote use of personal vision and auditory aids.  ?Promote reorientation, appropriate sensory stimulation, and routines to decrease risk of fall when changes in mental status are present.  ?Assess assistance level required for safe and effective self-care; consider referral for home care.  ?Encourage physical activity, such as  performance of self-care at highest level of ability, strength and balance exercise program, and provision of appropriate assistive devices; refer to rehabilitation therapy.  ?Refer to community-based fall preve

## 2021-12-03 NOTE — Patient Instructions (Signed)
?  Ms. Rahm , ?Thank you for taking time to come for your Medicare Wellness Visit. I appreciate your ongoing commitment to your health goals. Please review the following plan we discussed and let me know if I can assist you in the future.  ? ?These are the goals we discussed: ? Goals   ? ?  Absence of Fall and Fall-Related Injury   ?  Evidence-based guidance:  ?Assess fall risk using a validated tool when available. Consider balance and gait impairment, muscle weakness, diminished vision or hearing, environmental hazards, presence of urinary or bowel urgency and/or incontinence.  ?Communicate fall injury risk to interprofessional healthcare team.  ?Develop a fall prevention plan with the patient and family.  ?Promote use of personal vision and auditory aids.  ?Promote reorientation, appropriate sensory stimulation, and routines to decrease risk of fall when changes in mental status are present.  ?Assess assistance level required for safe and effective self-care; consider referral for home care.  ?Encourage physical activity, such as performance of self-care at highest level of ability, strength and balance exercise program, and provision of appropriate assistive devices; refer to rehabilitation therapy.  ?Refer to community-based fall prevention program where available.  ?If fall occurs, determine the cause and revise fall injury prevention plan.  ?Regularly review medication contribution to fall risk; consider risk related to polypharmacy and age.  ?Refer to pharmacist for consultation when concerns about medications are revealed.  ?Balance adequate pain management with potential for oversedation.  ?Provide guidance related to environmental modifications.  ?Consider supplementation with Vitamin D.   ?Notes:  ?  ?  Follow up with Primary Care Provider   ?  General - Client will not be readmitted within 30 days (C-SNP)   ? ?  ?  ?This is a list of the screening recommended for you and due dates:  ?Health  Maintenance  ?Topic Date Due  ? COVID-19 Vaccine (3 - Moderna risk series) 10/15/2021  ? Tetanus Vaccine  05/19/2027  ? Pneumonia Vaccine  Completed  ? Flu Shot  Completed  ? DEXA scan (bone density measurement)  Completed  ? Zoster (Shingles) Vaccine  Completed  ? HPV Vaccine  Aged Out  ? ? ?

## 2021-12-13 DIAGNOSIS — Z1159 Encounter for screening for other viral diseases: Secondary | ICD-10-CM | POA: Diagnosis not present

## 2021-12-13 DIAGNOSIS — I129 Hypertensive chronic kidney disease with stage 1 through stage 4 chronic kidney disease, or unspecified chronic kidney disease: Secondary | ICD-10-CM | POA: Diagnosis not present

## 2021-12-27 ENCOUNTER — Encounter: Payer: Self-pay | Admitting: Internal Medicine

## 2021-12-27 ENCOUNTER — Non-Acute Institutional Stay (SKILLED_NURSING_FACILITY): Payer: Medicare HMO | Admitting: Internal Medicine

## 2021-12-27 DIAGNOSIS — N185 Chronic kidney disease, stage 5: Secondary | ICD-10-CM | POA: Diagnosis not present

## 2021-12-27 DIAGNOSIS — I129 Hypertensive chronic kidney disease with stage 1 through stage 4 chronic kidney disease, or unspecified chronic kidney disease: Secondary | ICD-10-CM

## 2021-12-27 DIAGNOSIS — F01C0 Vascular dementia, severe, without behavioral disturbance, psychotic disturbance, mood disturbance, and anxiety: Secondary | ICD-10-CM

## 2021-12-27 DIAGNOSIS — N184 Chronic kidney disease, stage 4 (severe): Secondary | ICD-10-CM | POA: Diagnosis not present

## 2021-12-27 DIAGNOSIS — D649 Anemia, unspecified: Secondary | ICD-10-CM | POA: Diagnosis not present

## 2021-12-27 NOTE — Assessment & Plan Note (Signed)
Today she was nonverbal and did not follow commands.  She did not exhibit confabulation which has been a pattern in the past. ?

## 2021-12-27 NOTE — Assessment & Plan Note (Signed)
Systolic blood pressure ranges typically 100 to no more than 140.  Any values above 140 have been isolated outliers.  No change in present regimen indicated. ?

## 2021-12-27 NOTE — Assessment & Plan Note (Signed)
End-stage renal disease is stable with creatinine of 2.46 and GFR of 18.  No nephrotoxic agents identified on medication list review. ?

## 2021-12-27 NOTE — Progress Notes (Signed)
? ?  NURSING HOME LOCATION: [place] ?ROOM NUMBER:  109 D ? ?CODE STATUS:  DNR ? ?PCP:  Ok Edwards NP,PSC ? ?This is a nursing facility follow up visit of chronic medical diagnoses & to document compliance with Regulation 483.30 (c) in [employer] which mandates caregiver visit ( visits can alternate among physician, PA or NP as per statutes) within 10 days of 30 days / 60 days/ 90 days post admission to SNF date   ? ?Interim medical record and care since last SNF visit was updated with review of diagnostic studies and change in clinical status since last visit were documented. ? ?HPI: She is a permanent resident of the facility with medical diagnoses of essential hypertension, history of colon cancer, aortic atherosclerosis, DJD, dementia, and iron deficiency anemia. ? ?Most recent labs were in November 2022.  Stage IV CKD was present with a creatinine of 2.46, BUN of 60, and GFR of 18.  H/H was 10.3/32.8 with normochromic, normocytic indices.  Both the ESRD and chronic anemia are stable serially.  TSH was therapeutic at 1.335. ? ?Review of systems: She essentially was nonverbal today.  She did not respond to questions or follow any commands.Nursing staff reported that she seemed somewhat "whiny" this am as if she might be in pain.  She was given Tylenol with resolution of any distress. ? ?Physical exam:  ?Pertinent or positive findings: She appears somewhat chronically ill and suboptimally nourished.  Initially she was asleep in the wheelchair; there was no evidence of hypopnea, apnea, or snoring.  Lacrimal glands were prominent.  Arcus senilis is noted.  When she did awaken she was very lethargic and seemed to have difficulty focusing.  She would not open her mouth for intraoral exam.  Heart sounds are distant and rate is slow.  Breath sounds are decreased.  Abdomen is protuberant and bowel sounds were active.  Pedal pulses are not palpable.  She has an  antiwandering bracelet at the right ankle. ? ?General appearance: no acute distress, increased work of breathing is present.   ?Lymphatic: No lymphadenopathy about the head, neck, axilla. ?Eyes: No conjunctival inflammation or lid edema is present. There is no scleral icterus. ?Ears:  External ear exam shows no significant lesions or deformities.   ?Nose:  External nasal examination shows no deformity or inflammation. Nasal mucosa are pink and moist without lesions, exudates ?Neck:  No thyromegaly, masses, tenderness noted.    ?Heart:  No gallop, murmur, click, rub .  ?Lungs:  without wheezes, rhonchi, rales, rubs. ?Abdomen:  Abdomen is soft and nontender with no organomegaly, hernias, masses. ?GU: Deferred  ?Extremities:  No cyanosis, clubbing, edema  ?Neurologic exam :Balance, Rhomberg, finger to nose testing could not be completed due to clinical state ?Skin: Warm & dry w/o tenting. ?No significant lesions or rash. ? ?See summary under each active problem in the Problem List with associated updated therapeutic plan ? ? ?

## 2021-12-27 NOTE — Patient Instructions (Signed)
See assessment and plan under each diagnosis in the problem list and acutely for this visit 

## 2021-12-27 NOTE — Assessment & Plan Note (Signed)
Chronic anemia stable with H/H in the 10/30+ range.  No bleeding dyscrasias reported by staff. ?

## 2022-01-26 ENCOUNTER — Non-Acute Institutional Stay (SKILLED_NURSING_FACILITY): Payer: Medicare HMO | Admitting: Adult Health

## 2022-01-26 DIAGNOSIS — R627 Adult failure to thrive: Secondary | ICD-10-CM

## 2022-01-26 DIAGNOSIS — D649 Anemia, unspecified: Secondary | ICD-10-CM | POA: Diagnosis not present

## 2022-01-26 DIAGNOSIS — E559 Vitamin D deficiency, unspecified: Secondary | ICD-10-CM | POA: Diagnosis not present

## 2022-01-26 DIAGNOSIS — I700 Atherosclerosis of aorta: Secondary | ICD-10-CM | POA: Diagnosis not present

## 2022-01-26 NOTE — Progress Notes (Unsigned)
Location:  [facility] Room Number: 109D Place of Service:  SNF (31) Provider:  Ok Edwards, NP   CODE STATUS: DNR  No Known Allergies  Chief Complaint  Patient presents with   Medical Management of Chronic Issues    Routine follow up   Immunizations    COVID booster    HPI:    Past Medical History:  Diagnosis Date   Anemia    Arthritis    History of blood transfusion    "today is the first time" (08/05/2014)   Hypertension    Left femoral shaft fracture ([place]) 08/08/2014    Past Surgical History:  Procedure Laterality Date   APPENDECTOMY     CATARACT EXTRACTION Left    CHOLECYSTECTOMY     COLONOSCOPY N/A 09/26/2014   Procedure: COLONOSCOPY;  Surgeon: [name], MD;  [facility];  Service: Endoscopy;  Laterality: N/A;  1030am   CYSTECTOMY     "had cyst taken off lower back"   ESOPHAGOGASTRODUODENOSCOPY N/A 08/22/2014   NFA:[id number] ring at the gastro junction/small HH   ORIF FEMUR FRACTURE Left 08/07/2014   Procedure: OPEN REDUCTION INTERNAL FIXATION (ORIF)  LEFT FEMUR FRACTURE;  Surgeon: [name], MD;  Location: [place] [facility];  Service: Orthopedics;  Laterality: Left;   TONSILLECTOMY      Social History   Socioeconomic History   Marital status: Widowed    Spouse name: Not on file   Number of children: Not on file   Years of education: Not on file   Highest education level: Not on file  Occupational History   Occupation: retired   Tobacco Use   Smoking status: Former    Packs/day: 0.25    Years: 16.00    Pack years: 4.00    Types: Cigarettes   Smokeless tobacco: Never  Vaping Use   Vaping Use: Never used  Substance and Sexual Activity   Alcohol use: No   Drug use: No   Sexual activity: Never  Other Topics Concern   Not on file  Social History Narrative   Long term resident of [facility]    Social Determinants of Health   Financial Resource Strain: Not on file  Food Insecurity: Not on file  Transportation  Needs: Not on file  Physical Activity: Not on file  Stress: Not on file  Social Connections: Not on file  Intimate Partner Violence: Not on file   Family History  Family history unknown: Yes      VITAL SIGNS There were no vitals taken for this visit.  Outpatient Encounter Medications as of 01/26/2022  Medication Sig   acetaminophen (TYLENOL) 325 MG tablet Take 650 mg by mouth every 6 (six) hours as needed.   amLODipine (NORVASC) 5 MG tablet Take 5 mg by mouth daily.    ferrous sulfate 325 (65 FE) MG tablet Take 325 mg by mouth daily with breakfast.    melatonin 5 MG TABS Take 5 mg by mouth at bedtime.   metoprolol tartrate (LOPRESSOR) 25 MG tablet Take 25 mg by mouth 2 (two) times daily.   NON FORMULARY Diet change: Pureed diet   NON FORMULARY Wanderguard tag #[id number] to left ankle for safety awareness. Check placement and function qshift.   Nutritional Supplements (ENSURE ENLIVE PO) Take by mouth. BID 120 ml orally with medpass- to support overall protein/energy intake. Twice A Day Between Meals   Polyethyl Glycol-Propyl Glycol (SYSTANE) 0.4-0.3 % GEL ophthalmic gel Place 1 application into both eyes 2 (  two) times daily as needed.   sodium bicarbonate 650 MG tablet Take 650 mg by mouth 2 (two) times daily.   No facility-administered encounter medications on file as of 01/26/2022.     SIGNIFICANT DIAGNOSTIC EXAMS       ASSESSMENT/ PLAN:     Ok Edwards NP [facility]  Contact [phone] Monday through Friday 8am- 5pm  After hours call [phone]

## 2022-01-27 ENCOUNTER — Encounter: Payer: Self-pay | Admitting: Adult Health

## 2022-02-03 ENCOUNTER — Other Ambulatory Visit (HOSPITAL_COMMUNITY)
Admission: RE | Admit: 2022-02-03 | Discharge: 2022-02-03 | Disposition: A | Discharge status: Home / Self Care | Payer: Medicare HMO | Source: Skilled Nursing Facility | Attending: Adult Health | Admitting: Adult Health

## 2022-02-03 DIAGNOSIS — N189 Chronic kidney disease, unspecified: Secondary | ICD-10-CM | POA: Diagnosis not present

## 2022-02-03 DIAGNOSIS — I129 Hypertensive chronic kidney disease with stage 1 through stage 4 chronic kidney disease, or unspecified chronic kidney disease: Secondary | ICD-10-CM | POA: Diagnosis not present

## 2022-02-03 LAB — COMPREHENSIVE METABOLIC PANEL
ALT: 11 U/L (ref 0–44)
AST: 16 U/L (ref 15–41)
Albumin: 3.6 g/dL (ref 3.5–5.0)
Alkaline Phosphatase: 45 U/L (ref 38–126)
Anion gap: 7 (ref 5–15)
BUN: 92 mg/dL — ABNORMAL HIGH (ref 8–23)
CO2: 27 mmol/L (ref 22–32)
Calcium: 8.9 mg/dL (ref 8.9–10.3)
Chloride: 106 mmol/L (ref 98–111)
Creatinine, Ser: 2.87 mg/dL — ABNORMAL HIGH (ref 0.44–1.00)
GFR, Estimated: 15 mL/min — ABNORMAL LOW (ref >60)
Glucose, Bld: 87 mg/dL (ref 70–99)
Potassium: 5 mmol/L (ref 3.5–5.1)
Sodium: 140 mmol/L (ref 135–145)
Total Bilirubin: 0.4 mg/dL (ref 0.3–1.2)
Total Protein: 6.8 g/dL (ref 6.5–8.1)

## 2022-02-03 LAB — CBC
HCT: 30.1 % — ABNORMAL LOW (ref 36.0–46.0)
Hemoglobin: 9 g/dL — ABNORMAL LOW (ref 12.0–15.0)
MCH: 26.9 pg (ref 26.0–34.0)
MCHC: 29.9 g/dL — ABNORMAL LOW (ref 30.0–36.0)
MCV: 90.1 fL (ref 80.0–100.0)
Platelets: 253 10*3/uL (ref 150–400)
RBC: 3.34 MIL/uL — ABNORMAL LOW (ref 3.87–5.11)
RDW: 13.4 % (ref 11.5–15.5)
WBC: 8 10*3/uL (ref 4.0–10.5)
nRBC: 0 % (ref 0.0–0.2)

## 2022-02-03 LAB — TSH: TSH: 1.272 u[IU]/mL (ref 0.350–4.500)

## 2022-02-04 ENCOUNTER — Encounter: Payer: Self-pay | Admitting: Adult Health

## 2022-02-04 ENCOUNTER — Non-Acute Institutional Stay (SKILLED_NURSING_FACILITY): Payer: Medicare HMO | Admitting: Adult Health

## 2022-02-04 DIAGNOSIS — N185 Chronic kidney disease, stage 5: Secondary | ICD-10-CM

## 2022-02-04 DIAGNOSIS — N184 Chronic kidney disease, stage 4 (severe): Secondary | ICD-10-CM

## 2022-02-04 DIAGNOSIS — I129 Hypertensive chronic kidney disease with stage 1 through stage 4 chronic kidney disease, or unspecified chronic kidney disease: Secondary | ICD-10-CM

## 2022-02-04 DIAGNOSIS — I700 Atherosclerosis of aorta: Secondary | ICD-10-CM | POA: Diagnosis not present

## 2022-02-04 NOTE — Progress Notes (Signed)
Location:  [place] Room Number: 109 Place of Service:  SNF (31)   CODE STATUS: dnr  No Known Allergies  Chief Complaint  Patient presents with   Acute Visit    Care plan meeting    HPI:  We have come together for her care plan meeting. Unable to do BIMS; mood 6/30: not eating well; some depression. Nonambulatory no falls. She requires extensive to dependent with her adls. She is frequently incontinent and bladder and bowel. Dietary: weight is 84 pounds; on puree diet; puree appetite 1-50%. Requires assist with meals. Therapy: none at this time. 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  Past Medical History:  Diagnosis Date   Anemia    Arthritis    History of blood transfusion    "today is the first time" (08/05/2014)   Hypertension    Left femoral shaft fracture ([place]) 08/08/2014    Past Surgical History:  Procedure Laterality Date   APPENDECTOMY     CATARACT EXTRACTION Left    CHOLECYSTECTOMY     COLONOSCOPY N/A 09/26/2014   Procedure: COLONOSCOPY;  Surgeon: [name], MD;  [facility];  Service: Endoscopy;  Laterality: N/A;  1030am   CYSTECTOMY     "had cyst taken off lower back"   ESOPHAGOGASTRODUODENOSCOPY N/A 08/22/2014   QAS:[id number] ring at the gastro junction/small HH   ORIF FEMUR FRACTURE Left 08/07/2014   Procedure: OPEN REDUCTION INTERNAL FIXATION (ORIF)  LEFT FEMUR FRACTURE;  Surgeon: [name], MD;  Location: [place];  Service: Orthopedics;  Laterality: Left;   TONSILLECTOMY      Social History   Socioeconomic History   Marital status: Widowed    Spouse name: Not on file   Number of children: Not on file   Years of education: Not on file   Highest education level: Not on file  Occupational History   Occupation: retired   Tobacco Use   Smoking status: Former    Packs/day: 0.25    Years:  16.00    Pack years: 4.00    Types: Cigarettes   Smokeless tobacco: Never  Vaping Use   Vaping Use: Never used  Substance and Sexual Activity   Alcohol use: No   Drug use: No   Sexual activity: Never  Other Topics Concern   Not on file  Social History Narrative   Long term resident of [facility]    Social Determinants of Health   Financial Resource Strain: Not on file  Food Insecurity: Not on file  Transportation Needs: Not on file  Physical Activity: Not on file  Stress: Not on file  Social Connections: Not on file  Intimate Partner Violence: Not on file   Family History  Family history unknown: Yes      VITAL SIGNS BP 128/72   Pulse 68   Temp 97.6 F (36.4 C)   Ht '4\' 11"'$  (1.499 m)   Wt 84 lb 9.6 oz (38.4 kg)   BMI 17.09 kg/m   Outpatient Encounter Medications as of 02/04/2022  Medication Sig   acetaminophen (TYLENOL) 325 MG tablet Take 650 mg by mouth every 6 (six) hours as needed.   amLODipine (NORVASC) 5 MG tablet Take 5 mg by mouth daily.    ferrous sulfate 325 (65 FE) MG tablet Take 325 mg by mouth daily with breakfast.    melatonin 5 MG  TABS Take 5 mg by mouth at bedtime.   metoprolol tartrate (LOPRESSOR) 25 MG tablet Take 25 mg by mouth 2 (two) times daily.   NON FORMULARY Diet change: Pureed diet   NON FORMULARY Wanderguard tag #[id number] to left ankle for safety awareness. Check placement and function qshift.   Nutritional Supplements (ENSURE ENLIVE PO) Take by mouth. BID 120 ml orally with medpass- to support overall protein/energy intake. Twice A Day Between Meals   Polyethyl Glycol-Propyl Glycol (SYSTANE) 0.4-0.3 % GEL ophthalmic gel Place 1 application into both eyes 2 (two) times daily as needed.   sodium bicarbonate 650 MG tablet Take 650 mg by mouth 2 (two) times daily.   No facility-administered encounter medications on file as of 02/04/2022.     SIGNIFICANT DIAGNOSTIC EXAMS  LABS REVIEWED PREVIOUS;    02-15-21: wbc 5.8; hgb 10.0; hct 32.2; mcv 85.4 plt  273; glucose 84; bun 51; creat 3.05; k+ 4.2; na++ 138; ca 9.0; GFR 14  07-28-21: wbc 5.6; hgb 10.3; hct 32.8; mcv 87.2 plt 280; glucose 88; bun 60; creat 2.46; k+ 4.5; na++ 137; ca 8.5 GFR 18 liver normal albumin 3.4 tsh 1.335 vit D 51.94   NO NEW LABS.   Review of Systems  Unable to perform ROS: Dementia    Physical Exam Constitutional:      General: She is not in acute distress.    Appearance: She is well-developed. She is not diaphoretic.  Neck:     Thyroid: No thyromegaly.  Cardiovascular:     Rate and Rhythm: Normal rate and regular rhythm.     Pulses: Normal pulses.     Heart sounds: Normal heart sounds.  Pulmonary:     Effort: Pulmonary effort is normal. No respiratory distress.     Breath sounds: Normal breath sounds.  Abdominal:     General: Bowel sounds are normal. There is no distension.     Palpations: Abdomen is soft.     Tenderness: There is no abdominal tenderness.  Musculoskeletal:     Cervical back: Neck supple.     Right lower leg: No edema.     Left lower leg: No edema.     Comments: Is able to move all extremities Uses wheelchair History of left femur ORIF   Lymphadenopathy:     Cervical: No cervical adenopathy.  Skin:    General: Skin is warm and dry.  Neurological:     Mental Status: She is alert. Mental status is at baseline.  Psychiatric:        Mood and Affect: Mood normal.     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   Ok Edwards NP [facility]   call [phone]

## 2022-02-07 ENCOUNTER — Non-Acute Institutional Stay (SKILLED_NURSING_FACILITY): Payer: Medicare HMO | Admitting: Adult Health

## 2022-02-07 ENCOUNTER — Encounter: Payer: Self-pay | Admitting: Adult Health

## 2022-02-07 DIAGNOSIS — R627 Adult failure to thrive: Secondary | ICD-10-CM | POA: Diagnosis not present

## 2022-02-07 DIAGNOSIS — F01C0 Vascular dementia, severe, without behavioral disturbance, psychotic disturbance, mood disturbance, and anxiety: Secondary | ICD-10-CM

## 2022-02-07 NOTE — Progress Notes (Unsigned)
Location:  [place] Room Number: N/109/D Place of Service:  SNF (31), Ok Edwards S.,NP    CODE STATUS: DNR  No Known Allergies  Chief Complaint  Patient presents with   Acute Visit    Patient is here for decline in health status    HPI:  Staff report that her overall status has declined. She is spending more time in bed. Her appetite has worsened. Her weight is stable between 84-86 pounds. Staff is concerned that she is dying. Today she is up and dressed in her room. She normally spends time looking out at the courtyard. There are no indications of pain present.   Past Medical History:  Diagnosis Date   Anemia    Arthritis    History of blood transfusion    "today is the first time" (08/05/2014)   Hypertension    Left femoral shaft fracture ([place]) 08/08/2014    Past Surgical History:  Procedure Laterality Date   APPENDECTOMY     CATARACT EXTRACTION Left    CHOLECYSTECTOMY     COLONOSCOPY N/A 09/26/2014   Procedure: COLONOSCOPY;  Surgeon: [name], MD;  [facility];  Service: Endoscopy;  Laterality: N/A;  1030am   CYSTECTOMY     "had cyst taken off lower back"   ESOPHAGOGASTRODUODENOSCOPY N/A 08/22/2014   KZS:[id number] ring at the gastro junction/small HH   ORIF FEMUR FRACTURE Left 08/07/2014   Procedure: OPEN REDUCTION INTERNAL FIXATION (ORIF)  LEFT FEMUR FRACTURE;  Surgeon: [name], MD;  Location: [place];  Service: Orthopedics;  Laterality: Left;   TONSILLECTOMY      Social History   Socioeconomic History   Marital status: Widowed    Spouse name: Not on file   Number of children: Not on file   Years of education: Not on file   Highest education level: Not on file  Occupational History   Occupation: retired   Tobacco Use   Smoking status: Former    Packs/day: 0.25    Years: 16.00    Pack years: 4.00    Types: Cigarettes   Smokeless tobacco: Never  Vaping Use   Vaping Use: Never used  Substance and Sexual  Activity   Alcohol use: No   Drug use: No   Sexual activity: Never  Other Topics Concern   Not on file  Social History Narrative   Long term resident of [facility]    Social Determinants of Health   Financial Resource Strain: Not on file  Food Insecurity: Not on file  Transportation Needs: Not on file  Physical Activity: Not on file  Stress: Not on file  Social Connections: Not on file  Intimate Partner Violence: Not on file   Family History  Family history unknown: Yes      VITAL SIGNS BP 115/82   Pulse 63   Temp (!) 96.7 F (35.9 C)   Resp 20   Ht '4\' 10"'$  (1.473 m)   Wt 84 lb 9.6 oz (38.4 kg)   SpO2 99%   BMI 17.68 kg/m   Outpatient Encounter Medications as of 02/07/2022  Medication Sig   acetaminophen (TYLENOL) 325 MG tablet Take 650 mg by mouth every 6 (six) hours as needed.   amLODipine (NORVASC) 5 MG tablet Take 5 mg by mouth daily.    ferrous sulfate 325 (65 FE) MG tablet Take 325 mg by mouth daily with breakfast.    melatonin 5 MG TABS Take 5 mg by mouth at bedtime.  metoprolol tartrate (LOPRESSOR) 25 MG tablet Take 25 mg by mouth 2 (two) times daily.   NON FORMULARY Diet change: Pureed diet   NON FORMULARY Wanderguard tag #[id number] to left ankle for safety awareness. Check placement and function qshift.   Nutritional Supplements (ENSURE ENLIVE PO) Take by mouth. BID 120 ml orally with medpass- to support overall protein/energy intake. Twice A Day Between Meals   Polyethyl Glycol-Propyl Glycol (SYSTANE) 0.4-0.3 % GEL ophthalmic gel Place 1 application into both eyes 2 (two) times daily as needed.   sodium bicarbonate 650 MG tablet Take 650 mg by mouth 2 (two) times daily.   No facility-administered encounter medications on file as of 02/07/2022.     SIGNIFICANT DIAGNOSTIC EXAMS   LABS REVIEWED PREVIOUS;    02-15-21: wbc 5.8; hgb 10.0; hct 32.2; mcv 85.4 plt 273; glucose 84; bun 51; creat 3.05; k+ 4.2; na++ 138; ca 9.0; GFR 14  07-28-21: wbc 5.6; hgb 10.3; hct  32.8; mcv 87.2 plt 280; glucose 88; bun 60; creat 2.46; k+ 4.5; na++ 137; ca 8.5 GFR 18 liver normal albumin 3.4 tsh 1.335 vit D 51.94   NO NEW LABS.   Review of Systems  Unable to perform ROS: Dementia   Physical Exam Constitutional:      General: She is not in acute distress.    Appearance: She is underweight. She is not diaphoretic.  Neck:     Thyroid: No thyromegaly.  Cardiovascular:     Rate and Rhythm: Normal rate and regular rhythm.     Pulses: Normal pulses.     Heart sounds: Normal heart sounds.  Pulmonary:     Effort: Pulmonary effort is normal. No respiratory distress.     Breath sounds: Normal breath sounds.  Abdominal:     General: Bowel sounds are normal. There is no distension.     Palpations: Abdomen is soft.     Tenderness: There is no abdominal tenderness.  Musculoskeletal:        General: Normal range of motion.     Cervical back: Neck supple.     Right lower leg: No edema.     Left lower leg: No edema.  Lymphadenopathy:     Cervical: No cervical adenopathy.  Skin:    General: Skin is warm and dry.  Neurological:     Mental Status: She is alert. Mental status is at baseline.  Psychiatric:        Mood and Affect: Mood normal.     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 Failure to thrive in adult:    At this time will not make any further changes to her regimen; will monitor her status.    Ok Edwards NP [facility]   call [phone]

## 2022-02-24 ENCOUNTER — Non-Acute Institutional Stay (SKILLED_NURSING_FACILITY): Payer: Medicare HMO | Admitting: Adult Health

## 2022-02-24 ENCOUNTER — Encounter: Payer: Self-pay | Admitting: Adult Health

## 2022-02-24 DIAGNOSIS — N185 Chronic kidney disease, stage 5: Secondary | ICD-10-CM

## 2022-02-24 DIAGNOSIS — I129 Hypertensive chronic kidney disease with stage 1 through stage 4 chronic kidney disease, or unspecified chronic kidney disease: Secondary | ICD-10-CM | POA: Diagnosis not present

## 2022-02-24 DIAGNOSIS — Z85038 Personal history of other malignant neoplasm of large intestine: Secondary | ICD-10-CM

## 2022-02-24 DIAGNOSIS — N184 Chronic kidney disease, stage 4 (severe): Secondary | ICD-10-CM | POA: Diagnosis not present

## 2022-02-24 NOTE — Progress Notes (Unsigned)
Location:  [place] Room Number: 109-D Place of Service:  SNF (31)   CODE STATUS: DNR  No Known Allergies  Chief Complaint  Patient presents with   Medical Management of Chronic Issues    HPI:    Past Medical History:  Diagnosis Date   Anemia    Arthritis    History of blood transfusion    "today is the first time" (08/05/2014)   Hypertension    Left femoral shaft fracture ([place]) 08/08/2014    Past Surgical History:  Procedure Laterality Date   APPENDECTOMY     CATARACT EXTRACTION Left    CHOLECYSTECTOMY     COLONOSCOPY N/A 09/26/2014   Procedure: COLONOSCOPY;  Surgeon: [name], MD;  [facility];  Service: Endoscopy;  Laterality: N/A;  1030am   CYSTECTOMY     "had cyst taken off lower back"   ESOPHAGOGASTRODUODENOSCOPY N/A 08/22/2014   [id number] ring at the gastro junction/small HH   ORIF FEMUR FRACTURE Left 08/07/2014   Procedure: OPEN REDUCTION INTERNAL FIXATION (ORIF)  LEFT FEMUR FRACTURE;  Surgeon: [name], MD;  Location: [place];  Service: Orthopedics;  Laterality: Left;   TONSILLECTOMY      Social History   Socioeconomic History   Marital status: Widowed    Spouse name: Not on file   Number of children: Not on file   Years of education: Not on file   Highest education level: Not on file  Occupational History   Occupation: retired   Tobacco Use   Smoking status: Former    Packs/day: 0.25    Years: 16.00    Total pack years: 4.00    Types: Cigarettes   Smokeless tobacco: Never  Vaping Use   Vaping Use: Never used  Substance and Sexual Activity   Alcohol use: No   Drug use: No   Sexual activity: Never  Other Topics Concern   Not on file  Social History Narrative   Long term resident of [facility]    Social Determinants of Health   Financial Resource Strain: [place]  (06/12/2018)   Overall Financial Resource Strain (CARDIA)    Difficulty of Paying Living Expenses: Not hard at all  Food  Insecurity: No Food Insecurity (06/12/2018)   Hunger Vital Sign    Worried About Running Out of Food in the Last Year: Never true    Ran Out of Food in the Last Year: Never true  Transportation Needs: No Transportation Needs (06/12/2018)   PRAPARE - Hydrologist (Medical): No    Lack of Transportation (Non-Medical): No  Physical Activity: Inactive (06/12/2018)   Exercise Vital Sign    Days of Exercise per Week: 0 days    Minutes of Exercise per Session: 0 min  Stress: No Stress Concern Present (06/12/2018)   [place]    Feeling of Stress : Not at all  Social Connections: Unknown (07/31/2019)   Social Connection and Isolation Panel [NHANES]    Frequency of Communication with Friends and Family: Not on file    Frequency of Social Gatherings with Friends and Family: Never    Attends Religious Services: Not on file    Active Member of Clubs or Organizations: Not on file    Attends Archivist Meetings: Not on file    Marital Status: Not on file  Intimate Partner Violence: Not At Risk (06/12/2018)   Humiliation, Afraid, Rape, and  Kick questionnaire    Fear of Current or Ex-Partner: No    Emotionally Abused: No    Physically Abused: No    Sexually Abused: No   Family History  Family history unknown: Yes      VITAL SIGNS BP 126/89   Pulse (!) 59   Temp (!) 97.5 F (36.4 C)   Resp 20   Ht '4\' 10"'$  (1.473 m)   Wt 84 lb 9.6 oz (38.4 kg)   BMI 17.68 kg/m   Outpatient Encounter Medications as of 02/24/2022  Medication Sig   acetaminophen (TYLENOL) 325 MG tablet Take 650 mg by mouth every 6 (six) hours as needed.   amLODipine (NORVASC) 5 MG tablet Take 5 mg by mouth daily.    ferrous sulfate 325 (65 FE) MG tablet Take 325 mg by mouth daily with breakfast.    melatonin 5 MG TABS Take 5 mg by mouth at bedtime.   metoprolol tartrate (LOPRESSOR) 25 MG tablet Take 25 mg by mouth 2 (two)  times daily.   NON FORMULARY Diet change: Pureed diet   NON FORMULARY Wanderguard tag #[id number] to left ankle for safety awareness. Check placement and function qshift.   Nutritional Supplements (ENSURE ENLIVE PO) Take by mouth. BID 120 ml orally with medpass- to support overall protein/energy intake. Twice A Day Between Meals   Polyethyl Glycol-Propyl Glycol (SYSTANE) 0.4-0.3 % GEL ophthalmic gel Place 1 application into both eyes 2 (two) times daily as needed.   sodium bicarbonate 650 MG tablet Take 650 mg by mouth 2 (two) times daily.   No facility-administered encounter medications on file as of 02/24/2022.     SIGNIFICANT DIAGNOSTIC EXAMS       ASSESSMENT/ PLAN:     Ok Edwards NP [facility]  Contact [phone] Monday through Friday 8am- 5pm  After hours call [phone]

## 2022-02-28 DIAGNOSIS — M79675 Pain in left toe(s): Secondary | ICD-10-CM | POA: Diagnosis not present

## 2022-02-28 DIAGNOSIS — I739 Peripheral vascular disease, unspecified: Secondary | ICD-10-CM | POA: Diagnosis not present

## 2022-02-28 DIAGNOSIS — M79674 Pain in right toe(s): Secondary | ICD-10-CM | POA: Diagnosis not present

## 2022-02-28 DIAGNOSIS — B351 Tinea unguium: Secondary | ICD-10-CM | POA: Diagnosis not present

## 2022-03-14 ENCOUNTER — Non-Acute Institutional Stay (SKILLED_NURSING_FACILITY): Payer: Medicare HMO | Admitting: Internal Medicine

## 2022-03-14 DIAGNOSIS — F01C0 Vascular dementia, severe, without behavioral disturbance, psychotic disturbance, mood disturbance, and anxiety: Secondary | ICD-10-CM | POA: Diagnosis not present

## 2022-03-14 DIAGNOSIS — N184 Chronic kidney disease, stage 4 (severe): Secondary | ICD-10-CM

## 2022-03-14 DIAGNOSIS — D649 Anemia, unspecified: Secondary | ICD-10-CM

## 2022-03-14 DIAGNOSIS — I129 Hypertensive chronic kidney disease with stage 1 through stage 4 chronic kidney disease, or unspecified chronic kidney disease: Secondary | ICD-10-CM | POA: Diagnosis not present

## 2022-03-14 DIAGNOSIS — N185 Chronic kidney disease, stage 5: Secondary | ICD-10-CM

## 2022-03-14 DIAGNOSIS — I700 Atherosclerosis of aorta: Secondary | ICD-10-CM

## 2022-03-14 NOTE — Assessment & Plan Note (Signed)
She can provide no meaningful history.  No behavioral issues reported.  No indication for initiation of psychotropic medication.

## 2022-03-14 NOTE — Assessment & Plan Note (Signed)
There has been progression of the anemia with present H/H of 9/30.1 with minor hypochromia manifested by an MCHC of 29.9.  This is in the context of CKD stage IV-V.  No bleeding dyscrasias reported.

## 2022-03-14 NOTE — Assessment & Plan Note (Signed)
Current creatinine is 2.87 with a GFR of 15 indicating borderline stage IV-V.  Med list reviewed; no nephrotoxic agents identified.

## 2022-03-14 NOTE — Assessment & Plan Note (Signed)
No current complaints to suggest angina or anginal equivalent but dementia invalidates responses.

## 2022-03-14 NOTE — Progress Notes (Signed)
NURSING HOME LOCATION:  [facility] ROOM NUMBER:  109 D  CODE STATUS:  DNR  PCP:  Synthia Innocent NP  This is a nursing facility follow up visit of chronic medical diagnoses & to document compliance with Regulation 483.30 (c) in The Long Term Care Survey Manual Phase 2 which mandates caregiver visit ( visits can alternate among physician, PA or NP as per statutes) within 10 days of 30 days / 60 days/ 90 days post admission to SNF date    Interim medical record and care since last SNF visit was updated with review of diagnostic studies and change in clinical status since last visit were documented.  HPI: She is a permanent resident of facility with medical diagnoses of essential hypertension, CKD stage IV-5, aortic atherosclerosis, vascular dementia, history of colon cancer, adult failure to thrive syndrome, and chronic anemia. Labs are current as of 02/03/2022.  TSH is therapeutic at 1.272.  Comprehensive metabolic profile reveals a creatinine of 2.87 and GFR 15 indicating CKD low stage IV.  GFR has varied from 14 up to 21.  Creatinine has ranged from 2.04 up to 3.05.  H/H has dropped slightly with values of 9/30.1 from prior values of 10.3/32.8.  Hemoglobin has varied from this low of 9 up to high of 11.2.  Minor hypochromia is present with an MCHC of 29.9.  Review of systems: Dementia invalidated responses.  Her chief complaint is that "I cannot walk".  She could not elaborate.  She could not tell me how long this has been an issue.  Her roommate states that she does get up out of the bed or wheelchair to turn off the light; but is the only ambulation exhibited.  The roommate validates that she complains of back pain as well as foot pain.  Despite the anemia she denies any bleeding dyscrasias.  Constitutional: No fever, significant weight change Eyes: No redness, discharge, pain, vision change ENT/mouth: No nasal congestion,  purulent discharge, earache, change in hearing, sore  throat  Cardiovascular: No chest pain, palpitations, paroxysmal nocturnal dyspnea, claudication, edema  Respiratory: No cough, sputum production, hemoptysis, DOE, significant snoring, apnea   Gastrointestinal: No heartburn, dysphagia, abdominal pain, nausea /vomiting, rectal bleeding, melena, change in bowels Genitourinary: No dysuria, hematuria, pyuria, incontinence, nocturia Dermatologic: No rash, pruritus, change in appearance of skin Neurologic: No dizziness, headache, syncope, seizures, numbness, tingling Psychiatric: No significant anxiety, depression, insomnia, anorexia Endocrine: No change in hair/skin/nails, excessive thirst, excessive hunger, excessive urination  Hematologic/lymphatic: No significant bruising, lymphadenopathy, abnormal bleeding Allergy/immunology: No itchy/watery eyes, significant sneezing, urticaria, angioedema  Physical exam:  Pertinent or positive findings: She appears her stated age.  She is somewhat hard of hearing.  Her voice is very weak.  Arcus senilis is present.  She has prominent lacrimal glands.  Bilateral ptosis is present.  Facies tend to be blank.  She is edentulous.  Pedal pulses are decreased.  She is wearing an antiwandering bracelet over the left ankle.  Limb atrophy is present.  She is weak to opposition.  She follows commands poorly.  General appearance: no acute distress, increased work of breathing is present.   Lymphatic: No lymphadenopathy about the head, neck, axilla. Eyes: No conjunctival inflammation or lid edema is present. There is no scleral icterus. Ears:  External ear exam shows no significant lesions or deformities.   Nose:  External nasal examination shows no deformity or inflammation. Nasal mucosa are pink and moist without lesions, exudates Neck:  No thyromegaly, masses,  tenderness noted.    Heart:  Normal rate and regular rhythm. S1 and S2 normal without gallop, murmur, click, rub .  Lungs: Chest clear to auscultation without  wheezes, rhonchi, rales, rubs. Abdomen: Bowel sounds are normal. Abdomen is soft and nontender with no organomegaly, hernias, masses. GU: Deferred  Extremities:  No cyanosis, clubbing, edema  Neurologic exam :Balance, Rhomberg, finger to nose testing could not be completed due to clinical state Skin: Warm & dry w/o tenting. No significant lesions or rash.  See summary under each active problem in the Problem List with associated updated therapeutic plan

## 2022-03-14 NOTE — Patient Instructions (Signed)
See assessment and plan under each diagnosis in the problem list and acutely for this visit 

## 2022-03-14 NOTE — Assessment & Plan Note (Signed)
BP controlled; no change in antihypertensive medications  

## 2022-03-15 ENCOUNTER — Encounter: Payer: Self-pay | Admitting: Internal Medicine

## 2022-04-19 ENCOUNTER — Encounter: Payer: Self-pay | Admitting: Adult Health

## 2022-04-19 ENCOUNTER — Non-Acute Institutional Stay (SKILLED_NURSING_FACILITY): Payer: Medicare HMO | Admitting: Adult Health

## 2022-04-19 DIAGNOSIS — K5904 Chronic idiopathic constipation: Secondary | ICD-10-CM

## 2022-04-19 DIAGNOSIS — F01C0 Vascular dementia, severe, without behavioral disturbance, psychotic disturbance, mood disturbance, and anxiety: Secondary | ICD-10-CM | POA: Diagnosis not present

## 2022-04-19 DIAGNOSIS — D649 Anemia, unspecified: Secondary | ICD-10-CM

## 2022-04-19 NOTE — Progress Notes (Signed)
Location:  [place] Room Number: no/109/d Place of Service:  SNF (31) Candice Tobey S.,NP  CODE STATUS: DNR  No Known Allergies  Chief Complaint  Patient presents with   Medical Management of Chronic Issues                       Dementia without behavioral disturbance unspecified dementia type: Chronic idiopathic constipation:   Unspecified iron deficiency anemia    HPI:  She is a 88 year old long term resident of this facility being seen for the management of her chronic illnesses;  Dementia without behavioral disturbance unspecified dementia type: Chronic idiopathic constipation:   Unspecified iron deficiency anemia. There are no indications of pain present. She continues to get out of bed daily and does propel self around the facility.   Past Medical History:  Diagnosis Date   Anemia    Arthritis    History of blood transfusion    "today is the first time" (08/05/2014)   Hypertension    Left femoral shaft fracture ([place]) 08/08/2014    Past Surgical History:  Procedure Laterality Date   APPENDECTOMY     CATARACT EXTRACTION Left    CHOLECYSTECTOMY     COLONOSCOPY N/A 09/26/2014   Procedure: COLONOSCOPY;  Surgeon: [name], MD;  [facility];  Service: Endoscopy;  Laterality: N/A;  1030am   CYSTECTOMY     "had cyst taken off lower back"   ESOPHAGOGASTRODUODENOSCOPY N/A 08/22/2014   [id number] ring at the gastro junction/small HH   ORIF FEMUR FRACTURE Left 08/07/2014   Procedure: OPEN REDUCTION INTERNAL FIXATION (ORIF)  LEFT FEMUR FRACTURE;  Surgeon: [name], MD;  Location: [place];  Service: Orthopedics;  Laterality: Left;   TONSILLECTOMY      Social History   Socioeconomic History   Marital status: Widowed    Spouse name: Not on file   Number of children: Not on file   Years of education: Not on file   Highest education level: Not on file  Occupational History   Occupation: retired   Tobacco Use   Smoking status:  Former    Packs/day: 0.25    Years: 16.00    Total pack years: 4.00    Types: Cigarettes   Smokeless tobacco: Never  Vaping Use   Vaping Use: Never used  Substance and Sexual Activity   Alcohol use: No   Drug use: No   Sexual activity: Never  Other Topics Concern   Not on file  Social History Narrative   Long term resident of [facility]    Social Determinants of Health   Financial Resource Strain: [place]  (06/12/2018)   Overall Financial Resource Strain (CARDIA)    Difficulty of Paying Living Expenses: Not hard at all  Food Insecurity: No Food Insecurity (06/12/2018)   Hunger Vital Sign    Worried About Running Out of Food in the Last Year: Never true    Ran Out of Food in the Last Year: Never true  Transportation Needs: No Transportation Needs (06/12/2018)   PRAPARE - Hydrologist (Medical): No    Lack of Transportation (Non-Medical): No  Physical Activity: Inactive (06/12/2018)   Exercise Vital Sign    Days of Exercise per Week: 0 days    Minutes of Exercise per Session: 0 min  Stress: No Stress Concern Present (06/12/2018)   [place]  Feeling of Stress : Not at all  Social Connections: Unknown (07/31/2019)   Social Connection and Isolation Panel [NHANES]    Frequency of Communication with Friends and Family: Not on file    Frequency of Social Gatherings with Friends and Family: Never    Attends Religious Services: Not on file    Active Member of Clubs or Organizations: Not on file    Attends Archivist Meetings: Not on file    Marital Status: Not on file  Intimate Partner Violence: Not At Risk (06/12/2018)   Humiliation, Afraid, Rape, and Kick questionnaire    Fear of Current or Ex-Partner: No    Emotionally Abused: No    Physically Abused: No    Sexually Abused: No   Family History  Family history unknown: Yes      VITAL SIGNS BP 129/62   Pulse 63   Temp  (!) 97 F (36.1 C)   Resp 18   Ht '4\' 10"'$  (1.473 m)   Wt 83 lb (37.6 kg)   SpO2 100%   BMI 17.35 kg/m   Outpatient Encounter Medications as of 04/19/2022  Medication Sig   acetaminophen (TYLENOL) 325 MG tablet Take 650 mg by mouth every 6 (six) hours as needed.   amLODipine (NORVASC) 5 MG tablet Take 5 mg by mouth daily.    ferrous sulfate 325 (65 FE) MG tablet Take 325 mg by mouth daily with breakfast.    melatonin 5 MG TABS Take 5 mg by mouth at bedtime.   metoprolol tartrate (LOPRESSOR) 25 MG tablet Take 25 mg by mouth 2 (two) times daily.   MODERNA COVID-19 BIVALENT 50 MCG/0.5ML injection    NON FORMULARY Diet change: Pureed diet   NON FORMULARY Wanderguard tag #[id number] to left ankle for safety awareness. Check placement and function qshift.   Nutritional Supplements (ENSURE ENLIVE PO) Take by mouth. BID 120 ml orally with medpass- to support overall protein/energy intake. Twice A Day Between Meals   Polyethyl Glycol-Propyl Glycol (SYSTANE) 0.4-0.3 % GEL ophthalmic gel Place 1 application into both eyes 2 (two) times daily as needed.   sodium bicarbonate 650 MG tablet Take 650 mg by mouth 2 (two) times daily.   No facility-administered encounter medications on file as of 04/19/2022.     SIGNIFICANT DIAGNOSTIC EXAMS   LABS REVIEWED PREVIOUS;     07-28-21: wbc 5.6; hgb 10.3; hct 32.8; mcv 87.2 plt 280; glucose 88; bun 60; creat 2.46; k+ 4.5; na++ 137; ca 8.5 GFR 18 liver normal albumin 3.4 tsh 1.335 vit D 51.94  02-03-22: wbc 8.0; hgb 9.0; hct 30.1; mcv 90.1 plt 253; glucose 87; bun 92; creat 2.87; k+ 5.0; na++ 140; ca 8.9; gfr 15 protein 6.8; albumin 3.6; tsh 1.272  NO NEW LABS.     Review of Systems  Unable to perform ROS: Dementia   Physical Exam Constitutional:      General: She is not in acute distress.    Appearance: She is underweight. She is not diaphoretic.  Neck:     Thyroid: No thyromegaly.  Cardiovascular:     Rate and Rhythm: Normal rate and regular rhythm.      Pulses: Normal pulses.     Heart sounds: Normal heart sounds.  Pulmonary:     Effort: Pulmonary effort is normal. No respiratory distress.     Breath sounds: Normal breath sounds.  Abdominal:     General: Bowel sounds are normal. There is no distension.     Palpations: Abdomen is  soft.     Tenderness: There is no abdominal tenderness.  Musculoskeletal:     Cervical back: Neck supple.     Right lower leg: No edema.     Left lower leg: No edema.     Comments: Is able to move all extremities Uses wheelchair History of left femur ORIF   Lymphadenopathy:     Cervical: No cervical adenopathy.  Skin:    General: Skin is warm and dry.     Comments: Has chronic large cyst on sacral area of lower back     Neurological:     Mental Status: She is alert. Mental status is at baseline.  Psychiatric:        Mood and Affect: Mood normal.      ASSESSMENT/ PLAN:  TODAY:   Dementia without behavioral disturbance unspecified dementia type: continues to lose weight; her current weight is 83 pounds  2. Chronic idiopathic constipation: off medications  3. Unspecified iron deficiency anemia: hgb 10.3   PREVIOUS   4. Vitamin D deficiency level is 51.94 will monitor  5.  Failure to thrive in adult: weight is 84 pounds; will continue supplements as directed   6 . Aortic atherosclerosis: (ct 05-25-19) will monitor  7. CKD (Chronic kidney disease) stage IV: bun 92; creat 2.87; gfr 15  8. Benign hypertension with CKD (chronic kidney disease) stage IV: b/p 129/62 will continue lopressor 25 mg twice daily norvasc 5 mg daily   9. History of colon cancer: will monitor     Ok Edwards NP [facility]  call [phone]

## 2022-05-06 ENCOUNTER — Non-Acute Institutional Stay (SKILLED_NURSING_FACILITY): Payer: Medicare HMO | Admitting: Adult Health

## 2022-05-06 ENCOUNTER — Encounter: Payer: Self-pay | Admitting: Adult Health

## 2022-05-06 DIAGNOSIS — F01C0 Vascular dementia, severe, without behavioral disturbance, psychotic disturbance, mood disturbance, and anxiety: Secondary | ICD-10-CM

## 2022-05-06 DIAGNOSIS — I129 Hypertensive chronic kidney disease with stage 1 through stage 4 chronic kidney disease, or unspecified chronic kidney disease: Secondary | ICD-10-CM | POA: Diagnosis not present

## 2022-05-06 DIAGNOSIS — N184 Chronic kidney disease, stage 4 (severe): Secondary | ICD-10-CM

## 2022-05-06 DIAGNOSIS — I700 Atherosclerosis of aorta: Secondary | ICD-10-CM | POA: Diagnosis not present

## 2022-05-06 NOTE — Progress Notes (Signed)
Location:  [place] Room Number: 109-D Place of Service:  SNF (31)   CODE STATUS: dnr   No Known Allergies  Chief Complaint  Patient presents with   Acute Visit    Care plan meeting    HPI:  We have come together for her care plan meeting. BIMS no; mood 0/30. Is nonambulatory one fall without injury. She is limited to extensive assist with her adls. She is frequently incontinent of bladder and bowel. Dietary:  puree diet thin liquids 83 pounds stable for 6 months; appetite 1-75%; feeds self with cues and supervision. Therapy none at this time.  She does have delusions about seeing her mother; these delusions do not cause her any distress . 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  Past Medical History:  Diagnosis Date   Anemia    Arthritis    History of blood transfusion    "today is the first time" (08/05/2014)   Hypertension    Left femoral shaft fracture ([place]) 08/08/2014    Past Surgical History:  Procedure Laterality Date   APPENDECTOMY     CATARACT EXTRACTION Left    CHOLECYSTECTOMY     COLONOSCOPY N/A 09/26/2014   Procedure: COLONOSCOPY;  Surgeon: [name], MD;  [facility];  Service: Endoscopy;  Laterality: N/A;  1030am   CYSTECTOMY     "had cyst taken off lower back"   ESOPHAGOGASTRODUODENOSCOPY N/A 08/22/2014   CZY:[id number] ring at the gastro junction/small HH   ORIF FEMUR FRACTURE Left 08/07/2014   Procedure: OPEN REDUCTION INTERNAL FIXATION (ORIF)  LEFT FEMUR FRACTURE;  Surgeon: [name], MD;  Location: [facility];  Service: Orthopedics;  Laterality: Left;   TONSILLECTOMY      Social History   Socioeconomic History   Marital status: Widowed    Spouse name: Not on file   Number of children: Not  on file   Years of education: Not on file   Highest education level: Not on file  Occupational History   Occupation: retired   Tobacco Use   Smoking status: Former    Packs/day: 0.25    Years: 16.00    Total pack years: 4.00    Types: Cigarettes   Smokeless tobacco: Never  Vaping Use   Vaping Use: Never used  Substance and Sexual Activity   Alcohol use: No   Drug use: No   Sexual activity: Never  Other Topics Concern   Not on file  Social History Narrative   Long term resident of [facility]    Social Determinants of Health   Financial Resource Strain: [place]  (06/12/2018)   Overall Financial Resource Strain (CARDIA)    Difficulty of Paying Living Expenses: Not hard at all  Food Insecurity: No Food Insecurity (06/12/2018)   Hunger Vital Sign    Worried About Running Out of Food in the Last Year: Never true    Ran Out of Food in the Last Year: Never true  Transportation Needs: No Transportation Needs (06/12/2018)   PRAPARE - Hydrologist (Medical): No    Lack of Transportation (Non-Medical): No  Physical Activity: Inactive (06/12/2018)   Exercise Vital Sign    Days of Exercise per Week: 0 days    Minutes of Exercise per Session: 0 min  Stress: No Stress Concern Present (06/12/2018)   [place]    Feeling of Stress : Not at all  Social Connections: Unknown (07/31/2019)   Social Connection and Isolation Panel [NHANES]    Frequency of Communication with Friends and Family: Not on file    Frequency of Social Gatherings with Friends and Family: Never    Attends Religious Services: Not on Diplomatic Services operational officer of Clubs or Organizations: Not on file    Attends Archivist Meetings: Not on file    Marital Status: Not on file  Intimate Partner Violence: Not At Risk (06/12/2018)   Humiliation, Afraid, Rape, and Kick questionnaire    Fear of Current or Ex-Partner: No    Emotionally  Abused: No    Physically Abused: No    Sexually Abused: No   Family History  Family history unknown: Yes      VITAL SIGNS BP 139/62   Pulse 66   Temp (!) 97.4 F (36.3 C)   Resp 18   Ht '4\' 11"'$  (1.499 m)   Wt 83 lb (37.6 kg)   SpO2 99%   BMI 16.76 kg/m   Outpatient Encounter Medications as of 05/06/2022  Medication Sig   acetaminophen (TYLENOL) 325 MG tablet Take 650 mg by mouth every 6 (six) hours as needed.   amLODipine (NORVASC) 5 MG tablet Take 5 mg by mouth daily.    ferrous sulfate 325 (65 FE) MG tablet Take 325 mg by mouth daily with breakfast.    melatonin 5 MG TABS Take 5 mg by mouth at bedtime.   metoprolol tartrate (LOPRESSOR) 25 MG tablet Take 25 mg by mouth 2 (two) times daily.   MODERNA COVID-19 BIVALENT 50 MCG/0.5ML injection    NON FORMULARY Diet change: Pureed diet   NON FORMULARY Wanderguard tag #[id number] to left ankle for safety awareness. Check placement and function qshift.   Nutritional Supplements (ENSURE ENLIVE PO) Take by mouth. BID 120 ml orally with medpass- to support overall protein/energy intake. Twice A Day Between Meals   Polyethyl Glycol-Propyl Glycol (SYSTANE) 0.4-0.3 % GEL ophthalmic gel Place 1 application into both eyes 2 (two) times daily as needed.   sodium bicarbonate 650 MG tablet Take 650 mg by mouth 2 (two) times daily.   No facility-administered encounter medications on file as of 05/06/2022.     SIGNIFICANT DIAGNOSTIC EXAMS  LABS REVIEWED PREVIOUS;     07-28-21: wbc 5.6; hgb 10.3; hct 32.8; mcv 87.2 plt 280; glucose 88; bun 60; creat 2.46; k+ 4.5; na++ 137; ca 8.5 GFR 18 liver normal albumin 3.4 tsh 1.335 vit D 51.94  02-03-22: wbc 8.0; hgb 9.0; hct 30.1; mcv 90.1 plt 253; glucose 87; bun 92; creat 2.87; k+ 5.0; na++ 140; ca 8.9; gfr 15 protein 6.8; albumin 3.6; tsh 1.272  NO NEW LABS.     Review of Systems  Unable to perform ROS: Dementia    Physical Exam Constitutional:      General: She is not in acute distress.     Appearance: She is underweight. She is not diaphoretic.  Neck:     Thyroid: No thyromegaly.  Cardiovascular:     Rate and Rhythm: Normal rate and regular rhythm.     Pulses: Normal pulses.     Heart sounds: Normal heart sounds.  Pulmonary:     Effort: Pulmonary effort is normal. No respiratory distress.     Breath sounds: Normal breath sounds.  Abdominal:  General: Bowel sounds are normal. There is no distension.     Palpations: Abdomen is soft.     Tenderness: There is no abdominal tenderness.  Musculoskeletal:     Cervical back: Neck supple.     Right lower leg: No edema.     Left lower leg: No edema.     Comments:  Is able to move all extremities Uses wheelchair History of left femur ORIF    Lymphadenopathy:     Cervical: No cervical adenopathy.  Skin:    General: Skin is warm and dry.     Comments:  Has chronic large cyst on sacral area of lower back      Neurological:     Mental Status: She is alert. Mental status is at baseline.  Psychiatric:        Mood and Affect: Mood normal.       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   Will continue current medications Will continue current plan of care Will continue to monitor her status  Time spent with patient: 40 minutes: plan of care dietary medications    Ok Edwards NP [facility]   call [phone]

## 2022-05-15 DIAGNOSIS — M79671 Pain in right foot: Secondary | ICD-10-CM | POA: Diagnosis not present

## 2022-05-15 DIAGNOSIS — M19071 Primary osteoarthritis, right ankle and foot: Secondary | ICD-10-CM | POA: Diagnosis not present

## 2022-05-15 DIAGNOSIS — M25571 Pain in right ankle and joints of right foot: Secondary | ICD-10-CM | POA: Diagnosis not present

## 2022-05-17 ENCOUNTER — Non-Acute Institutional Stay (SKILLED_NURSING_FACILITY): Payer: Medicare HMO | Admitting: Adult Health

## 2022-05-17 ENCOUNTER — Encounter: Payer: Self-pay | Admitting: Adult Health

## 2022-05-17 DIAGNOSIS — R627 Adult failure to thrive: Secondary | ICD-10-CM

## 2022-05-17 DIAGNOSIS — I700 Atherosclerosis of aorta: Secondary | ICD-10-CM | POA: Diagnosis not present

## 2022-05-17 DIAGNOSIS — E559 Vitamin D deficiency, unspecified: Secondary | ICD-10-CM

## 2022-05-17 NOTE — Progress Notes (Signed)
Location:  [place] Room Number: 109-D Place of Service:  SNF (31) Provider: Ok Edwards, NP  CODE STATUS: DNR  No Known Allergies  Chief Complaint  Patient presents with   Medical Management of Chronic Issues                                 Vitamin D deficiency: Failure to thrive in adult: Aortic atherosclerosis     HPI:  She is a 88 year old long term resident of this facility being seen for the management of her chronic illnesses: Vitamin D deficiency: Failure to thrive in adult: Aortic atherosclerosis. There are no reports of uncontrolled pain. Her weight is stable; she does have a poor appetite. There are no reports oexcessive drainage from her cyst.   Past Medical History:  Diagnosis Date   Anemia    Arthritis    History of blood transfusion    "today is the first time" (08/05/2014)   Hypertension    Left femoral shaft fracture ([place]) 08/08/2014    Past Surgical History:  Procedure Laterality Date   APPENDECTOMY     CATARACT EXTRACTION Left    CHOLECYSTECTOMY     COLONOSCOPY N/A 09/26/2014   Procedure: COLONOSCOPY;  Surgeon: [name], MD;  [facility];  Service: Endoscopy;  Laterality: N/A;  1030am   CYSTECTOMY     "had cyst taken off lower back"   ESOPHAGOGASTRODUODENOSCOPY N/A 08/22/2014   [id number] ring at the gastro junction/small HH   ORIF FEMUR FRACTURE Left 08/07/2014   Procedure: OPEN REDUCTION INTERNAL FIXATION (ORIF)  LEFT FEMUR FRACTURE;  Surgeon: [name], MD;  Location: [place];  Service: Orthopedics;  Laterality: Left;   TONSILLECTOMY      Social History   Socioeconomic History   Marital status: Widowed    Spouse name: Not on file   Number of children: Not on file   Years of education: Not on file   Highest education level: Not on file  Occupational History   Occupation: retired   Tobacco Use   Smoking status: Former    Packs/day: 0.25    Years: 16.00    Total pack years: 4.00    Types:  Cigarettes   Smokeless tobacco: Never  Vaping Use   Vaping Use: Never used  Substance and Sexual Activity   Alcohol use: No   Drug use: No   Sexual activity: Never  Other Topics Concern   Not on file  Social History Narrative   Long term resident of [facility]    Social Determinants of Health   Financial Resource Strain: [place]  (06/12/2018)   Overall Financial Resource Strain (CARDIA)    Difficulty of Paying Living Expenses: Not hard at all  Food Insecurity: No Food Insecurity (06/12/2018)   Hunger Vital Sign    Worried About Running Out of Food in the Last Year: Never true    Ran Out of Food in the Last Year: Never true  Transportation Needs: No Transportation Needs (06/12/2018)   PRAPARE - Hydrologist (Medical): No    Lack of Transportation (Non-Medical): No  Physical Activity: Inactive (06/12/2018)   Exercise Vital Sign    Days of Exercise per Week: 0 days    Minutes of Exercise per Session: 0 min  Stress: No Stress Concern Present (06/12/2018)   [place]  Questionnaire    Feeling of Stress : Not at all  Social Connections: Unknown (07/31/2019)   Social Connection and Isolation Panel [NHANES]    Frequency of Communication with Friends and Family: Not on file    Frequency of Social Gatherings with Friends and Family: Never    Attends Religious Services: Not on file    Active Member of Clubs or Organizations: Not on file    Attends Archivist Meetings: Not on file    Marital Status: Not on file  Intimate Partner Violence: Not At Risk (06/12/2018)   Humiliation, Afraid, Rape, and Kick questionnaire    Fear of Current or Ex-Partner: No    Emotionally Abused: No    Physically Abused: No    Sexually Abused: No   Family History  Family history unknown: Yes      VITAL SIGNS BP 120/62   Pulse 66   Temp 98.3 F (36.8 C)   Resp 18   Ht '4\' 11"'$  (1.499 m)   Wt 83 lb 3.2 oz (37.7 kg)    SpO2 94%   BMI 16.80 kg/m   Outpatient Encounter Medications as of 05/17/2022  Medication Sig   acetaminophen (TYLENOL) 325 MG tablet Take 650 mg by mouth every 6 (six) hours as needed.   amLODipine (NORVASC) 5 MG tablet Take 5 mg by mouth daily.    ferrous sulfate 325 (65 FE) MG tablet Take 325 mg by mouth daily with breakfast.    melatonin 5 MG TABS Take 5 mg by mouth at bedtime.   metoprolol tartrate (LOPRESSOR) 25 MG tablet Take 25 mg by mouth 2 (two) times daily.   NON FORMULARY Diet change: Pureed diet   NON FORMULARY Wanderguard tag #[id number] to left ankle for safety awareness. Check placement and function qshift.   Nutritional Supplements (ENSURE ENLIVE PO) Take by mouth. BID 120 ml orally with medpass- to support overall protein/energy intake. Twice A Day Between Meals   Polyethyl Glycol-Propyl Glycol (SYSTANE) 0.4-0.3 % GEL ophthalmic gel Place 1 application into both eyes 2 (two) times daily as needed.   sodium bicarbonate 650 MG tablet Take 650 mg by mouth 2 (two) times daily.   No facility-administered encounter medications on file as of 05/17/2022.     SIGNIFICANT DIAGNOSTIC EXAMS  LABS REVIEWED PREVIOUS;     07-28-21: wbc 5.6; hgb 10.3; hct 32.8; mcv 87.2 plt 280; glucose 88; bun 60; creat 2.46; k+ 4.5; na++ 137; ca 8.5 GFR 18 liver normal albumin 3.4 tsh 1.335 vit D 51.94  02-03-22: wbc 8.0; hgb 9.0; hct 30.1; mcv 90.1 plt 253; glucose 87; bun 92; creat 2.87; k+ 5.0; na++ 140; ca 8.9; gfr 15 protein 6.8; albumin 3.6; tsh 1.272  NO NEW LABS.     Review of Systems  Unable to perform ROS: Dementia   Physical Exam Constitutional:      General: She is not in acute distress.    Appearance: She is well-developed. She is not diaphoretic.  Neck:     Thyroid: No thyromegaly.  Cardiovascular:     Rate and Rhythm: Normal rate and regular rhythm.     Pulses: Normal pulses.     Heart sounds: Normal heart sounds.  Pulmonary:     Effort: Pulmonary effort is normal. No respiratory  distress.     Breath sounds: Normal breath sounds.  Abdominal:     General: Bowel sounds are normal. There is no distension.     Palpations: Abdomen is soft.  Tenderness: There is no abdominal tenderness.  Musculoskeletal:     Cervical back: Neck supple.     Right lower leg: No edema.     Left lower leg: No edema.     Comments:   Is able to move all extremities Uses wheelchair History of left femur ORIF     Lymphadenopathy:     Cervical: No cervical adenopathy.  Skin:    General: Skin is warm and dry.     Comments:  Has chronic large cyst on sacral area of lower back       Neurological:     Mental Status: She is alert. Mental status is at baseline.  Psychiatric:        Mood and Affect: Mood normal.       ASSESSMENT/ PLAN:  TODAY:   Vitamin D deficiency: level is 51.94 will monitor   2. Failure to thrive in adult: weight is 83 pounds will continue supplements as directed  3. Aortic atherosclerosis (ct 05-25-19) no statin due to advanced age.   PREVIOUS   4. CKD (Chronic kidney disease) stage IV: bun 92; creat 2.87; gfr 15  5. Benign hypertension with CKD (chronic kidney disease) stage IV: b/p 120/62 will continue lopressor 25 mg twice daily norvasc 5 mg daily   6. History of colon cancer: will monitor   7. Dementia without behavioral disturbance unspecified dementia type: continues to lose weight; her current weight is 83 pounds  8. Chronic idiopathic constipation: off medications  9. Unspecified iron deficiency anemia: hgb 9.0    Ok Edwards NP [facility]   call [phone]

## 2022-06-04 ENCOUNTER — Other Ambulatory Visit (HOSPITAL_COMMUNITY)
Admission: RE | Admit: 2022-06-04 | Discharge: 2022-06-04 | Disposition: A | Discharge status: Home / Self Care | Payer: Medicare HMO | Source: Skilled Nursing Facility | Attending: Internal Medicine | Admitting: Internal Medicine

## 2022-06-04 DIAGNOSIS — U071 COVID-19: Secondary | ICD-10-CM | POA: Diagnosis not present

## 2022-06-04 DIAGNOSIS — R918 Other nonspecific abnormal finding of lung field: Secondary | ICD-10-CM | POA: Diagnosis not present

## 2022-06-04 LAB — CBC
HCT: 29.9 % — ABNORMAL LOW (ref 36.0–46.0)
Hemoglobin: 9.2 g/dL — ABNORMAL LOW (ref 12.0–15.0)
MCH: 27.1 pg (ref 26.0–34.0)
MCHC: 30.8 g/dL (ref 30.0–36.0)
MCV: 88.2 fL (ref 80.0–100.0)
Platelets: 188 10*3/uL (ref 150–400)
RBC: 3.39 MIL/uL — ABNORMAL LOW (ref 3.87–5.11)
RDW: 13.4 % (ref 11.5–15.5)
WBC: 5.2 10*3/uL (ref 4.0–10.5)
nRBC: 0 % (ref 0.0–0.2)

## 2022-06-04 LAB — BASIC METABOLIC PANEL
Anion gap: 6 (ref 5–15)
BUN: 99 mg/dL — ABNORMAL HIGH (ref 8–23)
CO2: 25 mmol/L (ref 22–32)
Calcium: 8.5 mg/dL — ABNORMAL LOW (ref 8.9–10.3)
Chloride: 111 mmol/L (ref 98–111)
Creatinine, Ser: 2.95 mg/dL — ABNORMAL HIGH (ref 0.44–1.00)
GFR, Estimated: 14 mL/min — ABNORMAL LOW (ref >60)
Glucose, Bld: 100 mg/dL — ABNORMAL HIGH (ref 70–99)
Potassium: 4.1 mmol/L (ref 3.5–5.1)
Sodium: 142 mmol/L (ref 135–145)

## 2022-06-04 LAB — D-DIMER, QUANTITATIVE: D-Dimer, Quant: 0.83 ug/mL-FEU — ABNORMAL HIGH (ref 0.00–0.50)

## 2022-06-04 LAB — C-REACTIVE PROTEIN: CRP: 1.1 mg/dL — ABNORMAL HIGH (ref <1.0)

## 2022-06-06 ENCOUNTER — Non-Acute Institutional Stay (SKILLED_NURSING_FACILITY): Payer: Medicare HMO | Admitting: Internal Medicine

## 2022-06-06 ENCOUNTER — Encounter: Payer: Self-pay | Admitting: Internal Medicine

## 2022-06-06 DIAGNOSIS — D649 Anemia, unspecified: Secondary | ICD-10-CM

## 2022-06-06 DIAGNOSIS — R768 Other specified abnormal immunological findings in serum: Secondary | ICD-10-CM

## 2022-06-06 DIAGNOSIS — N185 Chronic kidney disease, stage 5: Secondary | ICD-10-CM

## 2022-06-06 DIAGNOSIS — R7689 Other specified abnormal immunological findings in serum: Secondary | ICD-10-CM | POA: Insufficient documentation

## 2022-06-06 NOTE — Assessment & Plan Note (Addendum)
CKD stage V is essentially stable with current creatinine 2.95 and GFR 14.  Med list reviewed; no change in meds or dose indicated. Paxlovid not an option.

## 2022-06-06 NOTE — Assessment & Plan Note (Signed)
Anemia essentially stable with normochromic, normocytic indices and H/H of 9.2/29.9.

## 2022-06-06 NOTE — Patient Instructions (Signed)
See assessment and plan under each diagnosis in the problem list and acutely for this visit 

## 2022-06-06 NOTE — Assessment & Plan Note (Signed)
Clinically infection is not complicated as she exhibits no tachypnea, fever, or hypoxia.

## 2022-06-06 NOTE — Progress Notes (Signed)
   NURSING HOME LOCATION:  [facility] ROOM NUMBER:  [id number]  CODE STATUS:  DNR  PCP:  Ok Edwards NP  This is a nursing facility follow up visit for specific acute issue of Covid -19 infection.  Interim medical record and care since last SNF visit was updated with review of diagnostic studies and change in clinical status since last visit were documented.  JKD:TOIZT 19 PCR positive Imaging negative pneumonia Peak CRP 1.1  ,peak D dimer 0.83. DOAC not indicated as D-dimer is only minimally elevated. She has CKD stage V with a creatinine of 2.95 and GFR of 14.Paxlovid not an option. Course of Molnupiravir ,oral antiviral initiated. Normochromic, normocytic anemia is essentially stable with H/H of 9.2/29.9.  White blood count is normal.  Review of systems could not be completed due to dementia.  Her major complaint was "cannot walk."  She was essentially nonverbal for most of the exam with blank facies.  She would not follow commands.  Physical exam:  Pertinent or positive findings: She appears her age and chronically ill.  Lacrimal glands are prominent.  She has arcus senilis & dense cataracts.  She appears to be edentulous but I could not get her to keep her mouth open long enough for adequate exam.  Heart sounds are markedly distant and breath sounds are decreased.  Limbs are thin.  She has an antiwandering bracelet at the left ankle.  General appearance: no acute distress, increased work of breathing is present.   Lymphatic: No lymphadenopathy about the head, neck, axilla. Eyes: No conjunctival inflammation or lid edema is present. There is no scleral icterus. Ears:  External ear exam shows no significant lesions or deformities.   Nose:  External nasal examination shows no deformity or inflammation. Nasal mucosa are pink and moist without lesions, exudates Oral exam:  Lips and gums are healthy appearing. There is no oropharyngeal erythema or exudate. Neck:  No  thyromegaly, masses, tenderness noted.    Heart:  No gallop, murmur, click, rub .  Lungs: without wheezes, rhonchi, rales, rubs. Abdomen: Bowel sounds are normal. Abdomen is soft and nontender with no organomegaly, hernias, masses. GU: Deferred  Extremities:  No cyanosis, clubbing, edema  Neurologic exam :Balance, Rhomberg, finger to nose testing could not be completed due to clinical state Skin: Warm & dry w/o tenting. No significant lesions or rash.  See summary under each active problem in the Problem List with associated updated therapeutic plan

## 2022-06-23 ENCOUNTER — Non-Acute Institutional Stay (SKILLED_NURSING_FACILITY): Payer: Medicare HMO | Admitting: Adult Health

## 2022-06-23 ENCOUNTER — Encounter: Payer: Self-pay | Admitting: Adult Health

## 2022-06-23 DIAGNOSIS — N185 Chronic kidney disease, stage 5: Secondary | ICD-10-CM | POA: Diagnosis not present

## 2022-06-23 DIAGNOSIS — N184 Chronic kidney disease, stage 4 (severe): Secondary | ICD-10-CM

## 2022-06-23 DIAGNOSIS — Z85038 Personal history of other malignant neoplasm of large intestine: Secondary | ICD-10-CM

## 2022-06-23 DIAGNOSIS — I129 Hypertensive chronic kidney disease with stage 1 through stage 4 chronic kidney disease, or unspecified chronic kidney disease: Secondary | ICD-10-CM

## 2022-06-23 NOTE — Progress Notes (Signed)
Location:  [facility] Room Number: 109 Place of Service:  SNF (31) Provider:  Ok Edwards, NP  CODE STATUS: DNR  No Known Allergies  Chief Complaint  Patient presents with   Medical Management of Chronic Issues                                CKD (chronic kidney disease) stage IV:  Benign hypertension with CKD (Chronic kidney disease) stage IV:  History of colon cancer:      HPI:  She is a 88 year old long term resident of this facility being seen for the management of her chronic illnesses: CKD (chronic kidney disease) stage IV:  Benign hypertension with CKD (Chronic kidney disease) stage IV:  History of colon cancer. She has been treated for covid without complications at the end of Sept. Her weight is stable; her appetite; is mostly poor. There are no reports of pain present.   Past Medical History:  Diagnosis Date   Anemia    Arthritis    History of blood transfusion    "today is the first time" (08/05/2014)   Hypertension    Left femoral shaft fracture ([place]) 08/08/2014    Past Surgical History:  Procedure Laterality Date   APPENDECTOMY     CATARACT EXTRACTION Left    CHOLECYSTECTOMY     COLONOSCOPY N/A 09/26/2014   Procedure: COLONOSCOPY;  Surgeon: [name], MD;  [facility];  Service: Endoscopy;  Laterality: N/A;  1030am   CYSTECTOMY     "had cyst taken off lower back"   ESOPHAGOGASTRODUODENOSCOPY N/A 08/22/2014   [id number] ring at the gastro junction/small HH   ORIF FEMUR FRACTURE Left 08/07/2014   Procedure: OPEN REDUCTION INTERNAL FIXATION (ORIF)  LEFT FEMUR FRACTURE;  Surgeon: [name], MD;  Location: [place];  Service: Orthopedics;  Laterality: Left;   TONSILLECTOMY      Social History   Socioeconomic History   Marital status: Widowed    Spouse name: Not on file   Number of children: Not on file   Years of education: Not on file   Highest education level: Not on file  Occupational History   Occupation:  retired   Tobacco Use   Smoking status: Former    Packs/day: 0.25    Years: 16.00    Total pack years: 4.00    Types: Cigarettes   Smokeless tobacco: Never  Vaping Use   Vaping Use: Never used  Substance and Sexual Activity   Alcohol use: No   Drug use: No   Sexual activity: Never  Other Topics Concern   Not on file  Social History Narrative   Long term resident of [facility]    Social Determinants of Health   Financial Resource Strain: [place]  (06/12/2018)   Overall Financial Resource Strain (CARDIA)    Difficulty of Paying Living Expenses: Not hard at all  Food Insecurity: No Food Insecurity (06/12/2018)   Hunger Vital Sign    Worried About Running Out of Food in the Last Year: Never true    Ran Out of Food in the Last Year: Never true  Transportation Needs: No Transportation Needs (06/12/2018)   PRAPARE - Hydrologist (Medical): No    Lack of Transportation (Non-Medical): No  Physical Activity: Inactive (06/12/2018)   Exercise Vital Sign    Days of Exercise per Week: 0 days  Minutes of Exercise per Session: 0 min  Stress: No Stress Concern Present (06/12/2018)   [place]    Feeling of Stress : Not at all  Social Connections: Unknown (07/31/2019)   Social Connection and Isolation Panel [NHANES]    Frequency of Communication with Friends and Family: Not on file    Frequency of Social Gatherings with Friends and Family: Never    Attends Religious Services: Not on file    Active Member of Clubs or Organizations: Not on file    Attends Archivist Meetings: Not on file    Marital Status: Not on file  Intimate Partner Violence: Not At Risk (06/12/2018)   Humiliation, Afraid, Rape, and Kick questionnaire    Fear of Current or Ex-Partner: No    Emotionally Abused: No    Physically Abused: No    Sexually Abused: No   Family History  Family history unknown: Yes       VITAL SIGNS BP (!) 146/78   Pulse 78   Temp 97.8 F (36.6 C)   Resp 18   Ht '4\' 11"'$  (1.499 m)   Wt 83 lb 3.2 oz (37.7 kg)   SpO2 96%   BMI 16.80 kg/m   Outpatient Encounter Medications as of 06/23/2022  Medication Sig   acetaminophen (TYLENOL) 325 MG tablet Take 650 mg by mouth every 6 (six) hours as needed.   amLODipine (NORVASC) 5 MG tablet Take 5 mg by mouth daily.    ferrous sulfate 325 (65 FE) MG tablet Take 325 mg by mouth daily with breakfast.    melatonin 5 MG TABS Take 5 mg by mouth at bedtime.   metoprolol tartrate (LOPRESSOR) 25 MG tablet Take 25 mg by mouth 2 (two) times daily.   NON FORMULARY Diet change: Pureed diet   NON FORMULARY Wanderguard tag #[id number] to left ankle for safety awareness. Check placement and function qshift.   Nutritional Supplements (ENSURE ENLIVE PO) Take by mouth. BID 120 ml orally with medpass- to support overall protein/energy intake. Twice A Day Between Meals   sodium bicarbonate 650 MG tablet Take 650 mg by mouth 2 (two) times daily.   Polyethyl Glycol-Propyl Glycol (SYSTANE) 0.4-0.3 % GEL ophthalmic gel Place 1 application into both eyes 2 (two) times daily as needed.   No facility-administered encounter medications on file as of 06/23/2022.     SIGNIFICANT DIAGNOSTIC EXAMS  LABS REVIEWED PREVIOUS;     07-28-21: wbc 5.6; hgb 10.3; hct 32.8; mcv 87.2 plt 280; glucose 88; bun 60; creat 2.46; k+ 4.5; na++ 137; ca 8.5 GFR 18 liver normal albumin 3.4 tsh 1.335 vit D 51.94  02-03-22: wbc 8.0; hgb 9.0; hct 30.1; mcv 90.1 plt 253; glucose 87; bun 92; creat 2.87; k+ 5.0; na++ 140; ca 8.9; gfr 15 protein 6.8; albumin 3.6; tsh 1.272  TODAY  06-04-22: wbc 5.2; hgb 9.2; hct 29.9; mcv 88.2 plt 188; glucose 100; bun 99; creat 2.95; k+ 4.1; na ++ 142; ca 8.5; gfr 14; d-dimer 0.83; crp 1.1      Review of Systems  Unable to perform ROS: Dementia   Physical Exam Constitutional:      General: She is not in acute distress.    Appearance: She is  well-developed. She is not diaphoretic.  Neck:     Thyroid: No thyromegaly.  Cardiovascular:     Rate and Rhythm: Normal rate and regular rhythm.     Pulses: Normal pulses.  Heart sounds: Normal heart sounds.  Pulmonary:     Effort: Pulmonary effort is normal. No respiratory distress.     Breath sounds: Normal breath sounds.  Abdominal:     General: Bowel sounds are normal. There is no distension.     Palpations: Abdomen is soft.     Tenderness: There is no abdominal tenderness.  Musculoskeletal:     Cervical back: Neck supple.     Right lower leg: No edema.     Left lower leg: No edema.     Comments:   Is able to move all extremities Uses wheelchair History of left femur ORIF      Lymphadenopathy:     Cervical: No cervical adenopathy.  Skin:    General: Skin is warm and dry.     Comments: Has chronic large cyst on sacral area of lower back        Neurological:     Mental Status: She is alert. Mental status is at baseline.  Psychiatric:        Mood and Affect: Mood normal.       ASSESSMENT/ PLAN:  TODAY:   CKD (chronic kidney disease) stage IV: bun 99; creat 2.95 gfr 14   2. Benign hypertension with CKD (Chronic kidney disease) stage IV: b/p 146/78 will continue norvasc 5 mg daily and lopressor 25 mg twice daily  3. History of colon cancer: will monitor   PREVIOUS   4. Dementia without behavioral disturbance unspecified dementia type: continues to lose weight; her current weight is 83 pounds  5. Chronic idiopathic constipation: off medications  6. Unspecified iron deficiency anemia: hgb 9.0  7. Vitamin D deficiency: level is 51.94 will monitor   8. Failure to thrive in adult: weight is 83 pounds will continue supplements as directed  9. Aortic atherosclerosis (ct 05-25-19) no statin due to advanced age.       Ok Edwards NP [facility]   call [phone]

## 2022-06-27 ENCOUNTER — Encounter: Payer: Self-pay | Admitting: Adult Health

## 2022-07-06 ENCOUNTER — Encounter: Payer: Self-pay | Admitting: Adult Health

## 2022-07-06 NOTE — Progress Notes (Signed)
Location:  [place] Room Number: 109 Place of Service:  SNF (31) Provider:  Ok Edwards, NP   CODE STATUS: DNR  No Known Allergies  Chief Complaint  Patient presents with  . Medical Management of Chronic Issues                                    HPI:    Past Medical History:  Diagnosis Date  . Anemia   . Arthritis   . History of blood transfusion    "today is the first time" (08/05/2014)  . Hypertension   . Left femoral shaft fracture ([place]) 08/08/2014    Past Surgical History:  Procedure Laterality Date  . APPENDECTOMY    . CATARACT EXTRACTION Left   . CHOLECYSTECTOMY    . COLONOSCOPY N/A 09/26/2014   Procedure: COLONOSCOPY;  Surgeon: [name], MD;  [facility];  Service: Endoscopy;  Laterality: N/A;  1030am  . CYSTECTOMY     "had cyst taken off lower back"  . ESOPHAGOGASTRODUODENOSCOPY N/A 08/22/2014   [id number] ring at the gastro junction/small HH  . ORIF FEMUR FRACTURE Left 08/07/2014   Procedure: OPEN REDUCTION INTERNAL FIXATION (ORIF)  LEFT FEMUR FRACTURE;  Surgeon: [name], MD;  Location: [place];  Service: Orthopedics;  Laterality: Left;  . TONSILLECTOMY      Social History   Socioeconomic History  . Marital status: Widowed    Spouse name: Not on file  . Number of children: Not on file  . Years of education: Not on file  . Highest education level: Not on file  Occupational History  . Occupation: retired   Tobacco Use  . Smoking status: Former    Packs/day: 0.25    Years: 16.00    Total pack years: 4.00    Types: Cigarettes  . Smokeless tobacco: Never  Vaping Use  . Vaping Use: Never used  Substance and Sexual Activity  . Alcohol use: No  . Drug use: No  . Sexual activity: Never  Other Topics Concern  . Not on file  Social History Narrative   Long term resident of [facility]    Social Determinants of Health   Financial Resource Strain: [place] Risk  (06/12/2018)   Overall Financial Resource  Strain (CARDIA)   . Difficulty of Paying Living Expenses: Not hard at all  Food Insecurity: No Food Insecurity (06/12/2018)   Hunger Vital Sign   . Worried About Charity fundraiser in the Last Year: Never true   . Ran Out of Food in the Last Year: Never true  Transportation Needs: No Transportation Needs (06/12/2018)   PRAPARE - Transportation   . Lack of Transportation (Medical): No   . Lack of Transportation (Non-Medical): No  Physical Activity: Inactive (06/12/2018)   Exercise Vital Sign   . Days of Exercise per Week: 0 days   . Minutes of Exercise per Session: 0 min  Stress: No Stress Concern Present (06/12/2018)   [place]   . Feeling of Stress : Not at all  Social Connections: Unknown (07/31/2019)   Social Connection and Isolation Panel [NHANES]   . Frequency of Communication with Friends and Family: Not on file   . Frequency of Social Gatherings with Friends and Family: Never   . Attends Religious Services: Not on file   . Active Member of Clubs or  Organizations: Not on file   . Attends Archivist Meetings: Not on file   . Marital Status: Not on file  Intimate Partner Violence: Not At Risk (06/12/2018)   Humiliation, Afraid, Rape, and Kick questionnaire   . Fear of Current or Ex-Partner: No   . Emotionally Abused: No   . Physically Abused: No   . Sexually Abused: No   Family History  Family history unknown: Yes      VITAL SIGNS BP (!) 146/78   Pulse 78   Temp 97.8 F (36.6 C)   Resp 18   Ht '4\' 11"'$  (1.499 m)   Wt 83 lb 3.2 oz (37.7 kg)   SpO2 96%   BMI 16.80 kg/m   Outpatient Encounter Medications as of 07/06/2022  Medication Sig  . acetaminophen (TYLENOL) 325 MG tablet Take 650 mg by mouth every 6 (six) hours as needed.  Marland Kitchen amLODipine (NORVASC) 5 MG tablet Take 5 mg by mouth daily.   . ferrous sulfate 325 (65 FE) MG tablet Take 325 mg by mouth daily with breakfast.   . melatonin 5 MG  TABS Take 5 mg by mouth at bedtime.  . metoprolol tartrate (LOPRESSOR) 25 MG tablet Take 25 mg by mouth 2 (two) times daily.  . NON FORMULARY Diet change: Pureed diet  . NON FORMULARY Wanderguard tag [phone] to left ankle for safety awareness. Check placement and function qshift.  . Nutritional Supplements (ENSURE ENLIVE PO) Take by mouth. BID 120 ml orally with medpass- to support overall protein/energy intake. Twice A Day Between Meals  . sodium bicarbonate 650 MG tablet Take 650 mg by mouth 2 (two) times daily.  . [DISCONTINUED] Polyethyl Glycol-Propyl Glycol (SYSTANE) 0.4-0.3 % GEL ophthalmic gel Place 1 application into both eyes 2 (two) times daily as needed.   No facility-administered encounter medications on file as of 07/06/2022.     SIGNIFICANT DIAGNOSTIC EXAMS       ASSESSMENT/ PLAN:     Ok Edwards NP [facility]   call [phone]   This encounter was created in error - please disregard.

## 2022-07-18 ENCOUNTER — Non-Acute Institutional Stay (SKILLED_NURSING_FACILITY): Payer: Medicare HMO | Admitting: Adult Health

## 2022-07-18 ENCOUNTER — Encounter: Payer: Self-pay | Admitting: Adult Health

## 2022-07-18 DIAGNOSIS — I700 Atherosclerosis of aorta: Secondary | ICD-10-CM

## 2022-07-18 DIAGNOSIS — F01C0 Vascular dementia, severe, without behavioral disturbance, psychotic disturbance, mood disturbance, and anxiety: Secondary | ICD-10-CM

## 2022-07-18 NOTE — Progress Notes (Unsigned)
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 She continues to slowly lose weight; which is an expected outcome in the late stages of dementia.   Past Medical History:  Diagnosis Date   Anemia    Arthritis    History of blood transfusion    "today is the first time" (08/05/2014)   Hypertension    Left femoral shaft fracture ([place]) 08/08/2014    Past Surgical History:  Procedure Laterality Date   APPENDECTOMY     CATARACT EXTRACTION Left    CHOLECYSTECTOMY     COLONOSCOPY N/A 09/26/2014   Procedure: COLONOSCOPY;  Surgeon: [name], MD;  [facility];  Service: Endoscopy;  Laterality: N/A;  1030am   CYSTECTOMY     "had cyst taken off lower back"   ESOPHAGOGASTRODUODENOSCOPY N/A 08/22/2014   [id number]:[id number] ring at the gastro junction/small HH   ORIF FEMUR FRACTURE Left 08/07/2014   Procedure: OPEN REDUCTION INTERNAL FIXATION (ORIF)  LEFT FEMUR FRACTURE;  Surgeon: [name], MD;  Location: [place];  Service: Orthopedics;  Laterality: Left;   TONSILLECTOMY      Social History   Socioeconomic History   Marital status: Widowed    Spouse name: Not on file   Number of children: Not on file   Years of education: Not on file   Highest education level: Not on file  Occupational History   Occupation: retired    Tobacco Use   Smoking status: Former    Packs/day: 0.25    Years: 16.00    Total pack years: 4.00    Types: Cigarettes   Smokeless tobacco: Never  Vaping Use   Vaping Use: Never used  Substance and Sexual Activity   Alcohol use: No   Drug use: No   Sexual activity: Never  Other Topics Concern   Not on file  Social History Narrative   Long term resident of [facility]    Social Determinants of Health   Financial Resource Strain: [place]  (06/12/2018)   Overall Financial Resource Strain (CARDIA)    Difficulty of Paying Living Expenses: Not hard at all  Food Insecurity: No Food Insecurity (06/12/2018)   Hunger Vital Sign    Worried About Running Out of Food in the Last Year: Never true    Ran Out of Food in the Last Year: Never true  Transportation Needs: No Transportation Needs (06/12/2018)   PRAPARE - Hydrologist (Medical): No    Lack of Transportation (Non-Medical): No  Physical Activity: Inactive (06/12/2018)   Exercise Vital Sign    Days of Exercise per Week: 0 days    Minutes of Exercise per Session: 0 min  Stress: No Stress Concern Present (06/12/2018)   [place]  Feeling of Stress : Not at all  Social Connections: Unknown (07/31/2019)   Social Connection and Isolation Panel [NHANES]    Frequency of Communication with Friends and Family: Not on file    Frequency of Social Gatherings with Friends and Family: Never    Attends Religious Services: Not on file    Active Member of Clubs or Organizations: Not on file    Attends Archivist Meetings: Not on file    Marital Status: Not on file  Intimate Partner Violence: Not At Risk (06/12/2018)   Humiliation, Afraid, Rape, and Kick questionnaire    Fear of Current or Ex-Partner: No    Emotionally Abused: No    Physically Abused: No    Sexually Abused: No   Family History  Family history unknown: Yes      VITAL  SIGNS BP (!) 124/45   Pulse 64   Temp 97.6 F (36.4 C)   Resp 18   Ht '4\' 11"'$  (1.499 m)   Wt 79 lb 3.2 oz (35.9 kg)   SpO2 100%   BMI 16.00 kg/m   Outpatient Encounter Medications as of 07/18/2022  Medication Sig   acetaminophen (TYLENOL) 325 MG tablet Take 650 mg by mouth every 6 (six) hours as needed.   amLODipine (NORVASC) 5 MG tablet Take 5 mg by mouth daily.    ferrous sulfate 325 (65 FE) MG tablet Take 325 mg by mouth daily with breakfast.    melatonin 5 MG TABS Take 5 mg by mouth at bedtime.   metoprolol tartrate (LOPRESSOR) 25 MG tablet Take 25 mg by mouth 2 (two) times daily.   NON FORMULARY Diet change: Pureed diet   NON FORMULARY Wanderguard tag #[id number] to left ankle for safety awareness. Check placement and function qshift.   Nutritional Supplements (ENSURE ENLIVE PO) Take by mouth. BID 120 ml orally with medpass- to support overall protein/energy intake. Twice A Day Between Meals   sodium bicarbonate 650 MG tablet Take 650 mg by mouth 2 (two) times daily.   No facility-administered encounter medications on file as of 07/18/2022.     SIGNIFICANT DIAGNOSTIC EXAMS  LABS REVIEWED PREVIOUS;     07-28-21: wbc 5.6; hgb 10.3; hct 32.8; mcv 87.2 plt 280; glucose 88; bun 60; creat 2.46; k+ 4.5; na++ 137; ca 8.5 GFR 18 liver normal albumin 3.4 tsh 1.335 vit D 51.94  02-03-22: wbc 8.0; hgb 9.0; hct 30.1; mcv 90.1 plt 253; glucose 87; bun 92; creat 2.87; k+ 5.0; na++ 140; ca 8.9; gfr 15 protein 6.8; albumin 3.6; tsh 1.272 06-04-22: wbc 5.2; hgb 9.2; hct 29.9; mcv 88.2 plt 188; glucose 100; bun 99; creat 2.95; k+ 4.1; na ++ 142; ca 8.5; gfr 14; d-dimer 0.83; crp 1.1   NO NEW LABS.   Review of Systems  Unable to perform ROS: Dementia    Physical Exam Constitutional:      General: She is not in acute distress.    Appearance: She is underweight. She is not diaphoretic.  Neck:     Thyroid: No thyromegaly.  Cardiovascular:     Rate and Rhythm: Normal rate and regular rhythm.      Pulses: Normal pulses.     Heart sounds: Normal heart sounds.  Pulmonary:     Effort: Pulmonary effort is normal. No respiratory distress.     Breath sounds: Normal breath sounds.  Abdominal:     General: Bowel sounds are normal. There is no distension.     Palpations: Abdomen is soft.  Tenderness: There is no abdominal tenderness.  Musculoskeletal:        General: Normal range of motion.     Cervical back: Neck supple.     Right lower leg: No edema.     Left lower leg: No edema.  Lymphadenopathy:     Cervical: No cervical adenopathy.  Skin:    General: Skin is warm and dry.  Neurological:     Mental Status: She is alert. Mental status is at baseline.  Psychiatric:        Mood and Affect: Mood normal.       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   2. Chronic idiopathic constipation: will monitor  3. Unspecified iron deficiency: hgb 9.0   PREVIOUS   4. Vitamin D deficiency: level is 51.94 will monitor   5. Failure to thrive in adult: weight is 83 pounds will continue supplements as directed  6. Aortic atherosclerosis (ct 05-25-19) no statin due to advanced age.   7. CKD (chronic kidney disease) stage IV: bun 99; creat 2.95 gfr 14   8. Benign hypertension with CKD (Chronic kidney disease) stage IV: b/p 146/78 will continue norvasc 5 mg daily and lopressor 25 mg twice daily  9. History of colon cancer: will monitor   Ok Edwards NP [facility]  call [phone]

## 2022-08-05 ENCOUNTER — Non-Acute Institutional Stay (SKILLED_NURSING_FACILITY): Payer: Medicare HMO | Admitting: Adult Health

## 2022-08-05 ENCOUNTER — Encounter: Payer: Self-pay | Admitting: Adult Health

## 2022-08-05 DIAGNOSIS — F01C0 Vascular dementia, severe, without behavioral disturbance, psychotic disturbance, mood disturbance, and anxiety: Secondary | ICD-10-CM | POA: Diagnosis not present

## 2022-08-05 DIAGNOSIS — N185 Chronic kidney disease, stage 5: Secondary | ICD-10-CM | POA: Diagnosis not present

## 2022-08-05 DIAGNOSIS — I129 Hypertensive chronic kidney disease with stage 1 through stage 4 chronic kidney disease, or unspecified chronic kidney disease: Secondary | ICD-10-CM

## 2022-08-05 DIAGNOSIS — N184 Chronic kidney disease, stage 4 (severe): Secondary | ICD-10-CM | POA: Diagnosis not present

## 2022-08-05 NOTE — Progress Notes (Signed)
Location:  [place] Room Number: 109-D Place of Service:  SNF (31)   CODE STATUS: DNR  No Known Allergies  Chief Complaint  Patient presents with   Acute Visit    Care plan meeting     HPI:  We have come together for her care plan meeting.  BIMS none; mood 0/30.  She is nonambulatory with one fall without injury. She requires maximum assist with her adls. She is incontinent of bladder and bowel. Dietary: has ensure twice daily  weight is 79.2 pounds; down 3.8 pounds in 3 months. Puree diet: 1-75% appetite. Therapy:  Activities : rarely . 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; psychotic disturbance; mood disturbance or anxiety  Chronic kidney disease stage IV   Past Medical History:  Diagnosis Date   Anemia    Arthritis    History of blood transfusion    "today is the first time" (08/05/2014)   Hypertension    Left femoral shaft fracture ([place]) 08/08/2014    Past Surgical History:  Procedure Laterality Date   APPENDECTOMY     CATARACT EXTRACTION Left    CHOLECYSTECTOMY     COLONOSCOPY N/A 09/26/2014   Procedure: COLONOSCOPY;  Surgeon: [name], MD;  [facility];  Service: Endoscopy;  Laterality: N/A;  1030am   CYSTECTOMY     "had cyst taken off lower back"   ESOPHAGOGASTRODUODENOSCOPY N/A 08/22/2014   [id number] ring at the gastro junction/small HH   ORIF FEMUR FRACTURE Left 08/07/2014   Procedure: OPEN REDUCTION INTERNAL FIXATION (ORIF)  LEFT FEMUR FRACTURE;  Surgeon: [name], MD;  Location: [place];  Service: Orthopedics;  Laterality: Left;   TONSILLECTOMY      Social History   Socioeconomic History   Marital status: Widowed    Spouse name: Not on file   Number of children: Not on file   Years of education: Not on file   Highest education level: Not on file   Occupational History   Occupation: retired   Tobacco Use   Smoking status: Former    Packs/day: 0.25    Years: 16.00    Total pack years: 4.00    Types: Cigarettes   Smokeless tobacco: Never  Vaping Use   Vaping Use: Never used  Substance and Sexual Activity   Alcohol use: No   Drug use: No   Sexual activity: Never  Other Topics Concern   Not on file  Social History Narrative   Long term resident of [facility]    Social Determinants of Health   Financial Resource Strain: [place]  (06/12/2018)   Overall Financial Resource Strain (CARDIA)    Difficulty of Paying Living Expenses: Not hard at all  Food Insecurity: No Food Insecurity (06/12/2018)   Hunger Vital Sign    Worried About Running Out of Food in the Last Year: Never true    Ran Out of Food in the Last Year: Never true  Transportation Needs: No Transportation Needs (06/12/2018)   PRAPARE - Hydrologist (Medical): No    Lack of Transportation (Non-Medical): No  Physical Activity: Inactive (06/12/2018)   Exercise Vital Sign    Days of Exercise per Week: 0 days    Minutes of Exercise per Session: 0 min  Stress: No Stress Concern Present (06/12/2018)   [employer] -  Occupational Stress Questionnaire    Feeling of Stress : Not at all  Social Connections: Unknown (07/31/2019)   Social Connection and Isolation Panel [NHANES]    Frequency of Communication with Friends and Family: Not on file    Frequency of Social Gatherings with Friends and Family: Never    Attends Religious Services: Not on file    Active Member of Clubs or Organizations: Not on file    Attends Archivist Meetings: Not on file    Marital Status: Not on file  Intimate Partner Violence: Not At Risk (06/12/2018)   Humiliation, Afraid, Rape, and Kick questionnaire    Fear of Current or Ex-Partner: No    Emotionally Abused: No    Physically Abused: No    Sexually Abused: No   Family History   Family history unknown: Yes      VITAL SIGNS BP 110/62   Pulse 68   Temp (!) 97.4 F (36.3 C)   Ht '4\' 11"'$  (1.499 m)   Wt 79 lb 3.2 oz (35.9 kg)   BMI 16.00 kg/m   Outpatient Encounter Medications as of 08/05/2022  Medication Sig   acetaminophen (TYLENOL) 325 MG tablet Take 650 mg by mouth every 6 (six) hours as needed.   amLODipine (NORVASC) 5 MG tablet Take 5 mg by mouth daily.    ferrous sulfate 325 (65 FE) MG tablet Take 325 mg by mouth daily with breakfast.    melatonin 5 MG TABS Take 5 mg by mouth at bedtime.   metoprolol tartrate (LOPRESSOR) 25 MG tablet Take 25 mg by mouth 2 (two) times daily.   NON FORMULARY Diet change: Pureed diet   NON FORMULARY Wanderguard tag #[id number] to left ankle for safety awareness. Check placement and function qshift.   Nutritional Supplements (ENSURE ENLIVE PO) Take 120 mLs by mouth 2 (two) times daily between meals. With medpass- to support overall protein/energy intake.   sodium bicarbonate 650 MG tablet Take 650 mg by mouth 2 (two) times daily.   No facility-administered encounter medications on file as of 08/05/2022.     SIGNIFICANT DIAGNOSTIC EXAMS  LABS REVIEWED PREVIOUS;     02-03-22: wbc 8.0; hgb 9.0; hct 30.1; mcv 90.1 plt 253; glucose 87; bun 92; creat 2.87; k+ 5.0; na++ 140; ca 8.9; gfr 15 protein 6.8; albumin 3.6; tsh 1.272 06-04-22: wbc 5.2; hgb 9.2; hct 29.9; mcv 88.2 plt 188; glucose 100; bun 99; creat 2.95; k+ 4.1; na ++ 142; ca 8.5; gfr 14; d-dimer 0.83; crp 1.1   NO NEW LABS.   Review of Systems  Unable to perform ROS: Dementia   Physical Exam Constitutional:      General: She is not in acute distress.    Appearance: She is cachectic. She is not diaphoretic.  Neck:     Thyroid: No thyromegaly.  Cardiovascular:     Rate and Rhythm: Normal rate and regular rhythm.     Pulses: Normal pulses.     Heart sounds: Normal heart sounds.  Pulmonary:     Effort: Pulmonary effort is normal. No respiratory distress.     Breath  sounds: Normal breath sounds.  Abdominal:     General: Bowel sounds are normal. There is no distension.     Palpations: Abdomen is soft.     Tenderness: There is no abdominal tenderness.  Musculoskeletal:        General: Normal range of motion.     Cervical back: Neck supple.     Right lower leg:  No edema.     Left lower leg: No edema.  Lymphadenopathy:     Cervical: No cervical adenopathy.  Skin:    General: Skin is warm and dry.  Neurological:     Mental Status: She is alert. Mental status is at baseline.  Psychiatric:        Mood and Affect: Mood normal.      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   Time spent with patient: 40 minutes: medications; dietary; plan of care.    Ok Edwards NP [facility]   [phone]

## 2022-08-15 ENCOUNTER — Encounter: Payer: Self-pay | Admitting: Adult Health

## 2022-08-15 ENCOUNTER — Non-Acute Institutional Stay (SKILLED_NURSING_FACILITY): Payer: Medicare HMO | Admitting: Adult Health

## 2022-08-15 DIAGNOSIS — R627 Adult failure to thrive: Secondary | ICD-10-CM | POA: Diagnosis not present

## 2022-08-15 DIAGNOSIS — E559 Vitamin D deficiency, unspecified: Secondary | ICD-10-CM

## 2022-08-15 DIAGNOSIS — I700 Atherosclerosis of aorta: Secondary | ICD-10-CM | POA: Diagnosis not present

## 2022-08-15 NOTE — Progress Notes (Unsigned)
Location:  [place] Room Number: NO/109/D Place of Service:  SNF (31) Ok Edwards S.,NP  CODE STATUS: DNR  No Known Allergies  Chief Complaint  Patient presents with   Medical Management of Chronic Issues    Patient is here for a follow up for chronic conditions    Quality Metric Gaps    Patient is due for updated covid booster    HPI:    Past Medical History:  Diagnosis Date   Anemia    Arthritis    History of blood transfusion    "today is the first time" (08/05/2014)   Hypertension    Left femoral shaft fracture ([place]) 08/08/2014    Past Surgical History:  Procedure Laterality Date   APPENDECTOMY     CATARACT EXTRACTION Left    CHOLECYSTECTOMY     COLONOSCOPY N/A 09/26/2014   Procedure: COLONOSCOPY;  Surgeon: [name], MD;  [facility];  Service: Endoscopy;  Laterality: N/A;  1030am   CYSTECTOMY     "had cyst taken off lower back"   ESOPHAGOGASTRODUODENOSCOPY N/A 08/22/2014   [id number]:[id number] ring at the gastro junction/small HH   ORIF FEMUR FRACTURE Left 08/07/2014   Procedure: OPEN REDUCTION INTERNAL FIXATION (ORIF)  LEFT FEMUR FRACTURE;  Surgeon: [name], MD;  Location: [place];  Service: Orthopedics;  Laterality: Left;   TONSILLECTOMY      Social History   Socioeconomic History   Marital status: Widowed    Spouse name: Not on file   Number of children: Not on file   Years of education: Not on file   Highest education level: Not on file  Occupational History   Occupation: retired   Tobacco Use   Smoking status: Former    Packs/day: 0.25    Years: 16.00    Total pack years: 4.00    Types: Cigarettes   Smokeless tobacco: Never  Vaping Use   Vaping Use: Never used  Substance and Sexual Activity   Alcohol use: No   Drug use: No   Sexual activity: Never  Other Topics Concern   Not on file  Social History Narrative   Long term resident of [facility]    Social Determinants of Health   Financial Resource  Strain: [place]  (06/12/2018)   Overall Financial Resource Strain (CARDIA)    Difficulty of Paying Living Expenses: Not hard at all  Food Insecurity: No Food Insecurity (06/12/2018)   Hunger Vital Sign    Worried About Running Out of Food in the Last Year: Never true    Ran Out of Food in the Last Year: Never true  Transportation Needs: No Transportation Needs (06/12/2018)   PRAPARE - Hydrologist (Medical): No    Lack of Transportation (Non-Medical): No  Physical Activity: Inactive (06/12/2018)   Exercise Vital Sign    Days of Exercise per Week: 0 days    Minutes of Exercise per Session: 0 min  Stress: No Stress Concern Present (06/12/2018)   [place]    Feeling of Stress : Not at all  Social Connections: Unknown (07/31/2019)   Social Connection and Isolation Panel [NHANES]    Frequency of Communication with Friends and Family: Not on file    Frequency of Social Gatherings with Friends and Family: Never    Attends Religious Services: Not on file    Active Member of Clubs or Organizations: Not on file  Attends Archivist Meetings: Not on file    Marital Status: Not on file  Intimate Partner Violence: Not At Risk (06/12/2018)   Humiliation, Afraid, Rape, and Kick questionnaire    Fear of Current or Ex-Partner: No    Emotionally Abused: No    Physically Abused: No    Sexually Abused: No   Family History  Family history unknown: Yes      VITAL SIGNS BP (!) 113/56   Pulse 69   Temp (!) 97.2 F (36.2 C)   Resp 18   Ht '4\' 11"'$  (1.499 m)   Wt 79 lb 6.4 oz (36 kg)   SpO2 98%   BMI 16.04 kg/m   Outpatient Encounter Medications as of 08/15/2022  Medication Sig   acetaminophen (TYLENOL) 325 MG tablet Take 650 mg by mouth every 6 (six) hours as needed.   amLODipine (NORVASC) 5 MG tablet Take 5 mg by mouth daily.    ferrous sulfate 325 (65 FE) MG tablet Take 325 mg by mouth  daily with breakfast.    melatonin 5 MG TABS Take 5 mg by mouth at bedtime.   metoprolol tartrate (LOPRESSOR) 25 MG tablet Take 25 mg by mouth 2 (two) times daily.   NON FORMULARY Diet change: Pureed diet   NON FORMULARY Wanderguard tag #[id number] to left ankle for safety awareness. Check placement and function qshift.   Nutritional Supplements (ENSURE ENLIVE PO) Take 120 mLs by mouth 2 (two) times daily between meals. With medpass- to support overall protein/energy intake.   sodium bicarbonate 650 MG tablet Take 650 mg by mouth 2 (two) times daily.   No facility-administered encounter medications on file as of 08/15/2022.     SIGNIFICANT DIAGNOSTIC EXAMS       ASSESSMENT/ PLAN:     Ok Edwards NP [facility]  Contact [phone] Monday through Friday 8am- 5pm  After hours call [phone]

## 2022-09-07 ENCOUNTER — Non-Acute Institutional Stay (SKILLED_NURSING_FACILITY): Payer: Medicare HMO | Admitting: Internal Medicine

## 2022-09-07 ENCOUNTER — Encounter: Payer: Self-pay | Admitting: Internal Medicine

## 2022-09-07 DIAGNOSIS — D649 Anemia, unspecified: Secondary | ICD-10-CM | POA: Diagnosis not present

## 2022-09-07 DIAGNOSIS — I129 Hypertensive chronic kidney disease with stage 1 through stage 4 chronic kidney disease, or unspecified chronic kidney disease: Secondary | ICD-10-CM

## 2022-09-07 DIAGNOSIS — F01C0 Vascular dementia, severe, without behavioral disturbance, psychotic disturbance, mood disturbance, and anxiety: Secondary | ICD-10-CM | POA: Diagnosis not present

## 2022-09-07 DIAGNOSIS — N184 Chronic kidney disease, stage 4 (severe): Secondary | ICD-10-CM

## 2022-09-07 DIAGNOSIS — N185 Chronic kidney disease, stage 5: Secondary | ICD-10-CM

## 2022-09-07 NOTE — Progress Notes (Signed)
   NURSING HOME LOCATION:  [facility] ROOM NUMBER:  109 D  CODE STATUS:  DNR  PCP:  Ok Edwards NP  This is a nursing facility follow up visit of chronic medical diagnoses & to document compliance with Regulation 483.30 (c) in The [place] Manual Phase 2 which mandates caregiver visit ( visits can alternate among physician, PA or NP as per statutes) within 10 days of 30 days / 60 days/ 90 days post admission to SNF date    Interim medical record and care since last SNF visit was updated with review of diagnostic studies and change in clinical status since last visit were documented.  HPI: She is a permanent resident of the facility with diagnoses of chronic anemia, degenerative joint disease, essential hypertension, aortic atherosclerosis, CKD stage IV, history of colon cancer, vitamin D deficiency, and dementia.  On 06/04/2022 creatinine was 2.85 with a GFR 14; serially this is essentially stable as prior creatinine was 2.87 and GFR 16.  This indicates stage V CKD.  Chronic anemia in the setting of ESRD has improved slightly with H/H of 9.2/28.8; prior values were 8/30.1.  Review of systems: Dementia invalidated responses.  She can provide no meaningful history and did not understand why I had come to see her.  Her roommate states that the resident complains of pain in her feet "all the time" and also in the stomach.  She states that the resident repeatedly talks to herself crying out to her "mama" and praying about feet pain.  She validated no complaints to me.  Physical exam:  Pertinent or positive findings: She appears her age and suboptimally nourished.  She is hard of hearing.  When she did speak it was in a high-pitched whisper.  The lacrimal glands are prominent.  She has arcus senilis.  Proptosis on the right is suggested.  She appears to be edentulous but she refused to open her mouth for adequate exam.  Heart sounds are markedly distant.  Rhythm appears  slightly irregular clinically.  Breath sounds are decreased.  Pedal pulses are decreased.  There is marked limb atrophy.  Strength could not be tested as she would not follow commands.  There is a wound over the sacral lesion which is dressed.  Wound Care Nurse monitors this.  General appearance:no acute distress, increased work of breathing is present.   Lymphatic: No lymphadenopathy about the head, neck, axilla. Eyes: No conjunctival inflammation or lid edema is present. There is no scleral icterus. Ears:  External ear exam shows no significant lesions or deformities.   Nose:  External nasal examination shows no deformity or inflammation. Nasal mucosa are pink and moist without lesions, exudates Oral exam:  Lips and gums are healthy appearing. There is no oropharyngeal erythema or exudate. Neck:  No thyromegaly, masses, tenderness noted.    Heart:  No gallop, murmur, click, rub .  Lungs: without wheezes, rhonchi, rales, rubs. Abdomen: Bowel sounds are normal. Abdomen is soft and nontender with no organomegaly, hernias, masses. GU: Deferred  Extremities:  No cyanosis, clubbing, edema  Neurologic exam :Balance, Rhomberg, finger to nose testing could not be completed due to clinical state Skin: Warm & dry w/o tenting. No significant rash.  See summary under each active problem in the Problem List with associated updated therapeutic plan

## 2022-09-07 NOTE — Patient Instructions (Signed)
See assessment and plan under each diagnosis in the problem list and acutely for this visit 

## 2022-09-07 NOTE — Assessment & Plan Note (Signed)
H/H 9.2/28.8 , up from 8.0/30.10. No bleeding dyscrasias reported; anemia reflects CKD Stage V.

## 2022-09-07 NOTE — Assessment & Plan Note (Addendum)
Today's BP is outlier & is in context of some agitation & possibly pain syndrome. CKD Stage V. BP controlled; no change in antihypertensive medications.Continue to monitor & establish BP average. Not candidate for ACE-I or ARBs.

## 2022-09-07 NOTE — Assessment & Plan Note (Signed)
Current creat 2.85/ GFR 14, serially stable StageV.  No nephrotoxic agents on record.

## 2022-09-09 NOTE — Assessment & Plan Note (Signed)
Severe dementia. She can follow no commands or provide any meaningful history.

## 2022-09-12 DIAGNOSIS — Z1152 Encounter for screening for COVID-19: Secondary | ICD-10-CM | POA: Diagnosis not present

## 2022-09-12 DIAGNOSIS — I739 Peripheral vascular disease, unspecified: Secondary | ICD-10-CM | POA: Diagnosis not present

## 2022-09-12 DIAGNOSIS — B351 Tinea unguium: Secondary | ICD-10-CM | POA: Diagnosis not present

## 2022-09-12 DIAGNOSIS — M2141 Flat foot [pes planus] (acquired), right foot: Secondary | ICD-10-CM | POA: Diagnosis not present

## 2022-09-12 DIAGNOSIS — Z20828 Contact with and (suspected) exposure to other viral communicable diseases: Secondary | ICD-10-CM | POA: Diagnosis not present

## 2022-09-12 DIAGNOSIS — M2142 Flat foot [pes planus] (acquired), left foot: Secondary | ICD-10-CM | POA: Diagnosis not present

## 2022-09-12 DIAGNOSIS — I129 Hypertensive chronic kidney disease with stage 1 through stage 4 chronic kidney disease, or unspecified chronic kidney disease: Secondary | ICD-10-CM | POA: Diagnosis not present

## 2022-10-10 ENCOUNTER — Encounter: Payer: Self-pay | Admitting: Adult Health

## 2022-10-10 ENCOUNTER — Non-Acute Institutional Stay (SKILLED_NURSING_FACILITY): Payer: Medicare HMO | Admitting: Adult Health

## 2022-10-10 DIAGNOSIS — N184 Chronic kidney disease, stage 4 (severe): Secondary | ICD-10-CM

## 2022-10-10 DIAGNOSIS — N185 Chronic kidney disease, stage 5: Secondary | ICD-10-CM | POA: Diagnosis not present

## 2022-10-10 DIAGNOSIS — I129 Hypertensive chronic kidney disease with stage 1 through stage 4 chronic kidney disease, or unspecified chronic kidney disease: Secondary | ICD-10-CM

## 2022-10-10 DIAGNOSIS — Z85038 Personal history of other malignant neoplasm of large intestine: Secondary | ICD-10-CM

## 2022-10-10 NOTE — Progress Notes (Signed)
Location:  [place] Room Number: NO/109/D Place of Service:  SNF (31) Dorrien Grunder S.,NP  CODE STATUS: DNR  No Known Allergies  Chief Complaint  Patient presents with   Medical Management of Chronic Issues                   CKD (chronic kidney disease) stage IV: Benign hypertension with CKD (Chronic kidney disease) stage IV:  History of colon cancer:      HPI:  Alicia Guerra is a 89 year old long term resident of this facility being seen for the management of her chronic illnesses: CKD (chronic kidney disease) stage IV: Benign hypertension with CKD (Chronic kidney disease) stage IV:  History of colon cancer. There are no reports of uncontrolled pain. Alicia Guerra has had GI virus this past month without complication.   Past Medical History:  Diagnosis Date   Anemia    Arthritis    History of blood transfusion    "today is the first time" (08/05/2014)   Hypertension    Left femoral shaft fracture ([place]) 08/08/2014    Past Surgical History:  Procedure Laterality Date   APPENDECTOMY     CATARACT EXTRACTION Left    CHOLECYSTECTOMY     COLONOSCOPY N/A 09/26/2014   Procedure: COLONOSCOPY;  Surgeon: [name], MD;  [facility];  Service: Endoscopy;  Laterality: N/A;  1030am   CYSTECTOMY     "had cyst taken off lower back"   ESOPHAGOGASTRODUODENOSCOPY N/A 08/22/2014   CZY:[id number] ring at the gastro junction/small HH   ORIF FEMUR FRACTURE Left 08/07/2014   Procedure: OPEN REDUCTION INTERNAL FIXATION (ORIF)  LEFT FEMUR FRACTURE;  Surgeon: [name], MD;  Location: [place];  Service: Orthopedics;  Laterality: Left;   TONSILLECTOMY      Social History   Socioeconomic History   Marital status: Widowed    Spouse name: Not on file   Number of children: Not on file   Years of education: Not on file   Highest education level: Not on file  Occupational History   Occupation: retired   Tobacco Use   Smoking status: Former    Packs/day: 0.25    Years:  16.00    Total pack years: 4.00    Types: Cigarettes   Smokeless tobacco: Never  Vaping Use   Vaping Use: Never used  Substance and Sexual Activity   Alcohol use: No   Drug use: No   Sexual activity: Never  Other Topics Concern   Not on file  Social History Narrative   Long term resident of [facility]    Social Determinants of Health   Financial Resource Strain: [place]  (06/12/2018)   Overall Financial Resource Strain (CARDIA)    Difficulty of Paying Living Expenses: Not hard at all  Food Insecurity: No Food Insecurity (06/12/2018)   Hunger Vital Sign    Worried About Running Out of Food in the Last Year: Never true    Ran Out of Food in the Last Year: Never true  Transportation Needs: No Transportation Needs (06/12/2018)   PRAPARE - Hydrologist (Medical): No    Lack of Transportation (Non-Medical): No  Physical Activity: Inactive (06/12/2018)   Exercise Vital Sign    Days of Exercise per Week: 0 days    Minutes of Exercise per Session: 0 min  Stress: No Stress Concern Present (06/12/2018)   [place]  Feeling of Stress : Not at all  Social Connections: Unknown (07/31/2019)   Social Connection and Isolation Panel [NHANES]    Frequency of Communication with Friends and Family: Not on file    Frequency of Social Gatherings with Friends and Family: Never    Attends Religious Services: Not on file    Active Member of Clubs or Organizations: Not on file    Attends Archivist Meetings: Not on file    Marital Status: Not on file  Intimate Partner Violence: Not At Risk (06/12/2018)   Humiliation, Afraid, Rape, and Kick questionnaire    Fear of Current or Ex-Partner: No    Emotionally Abused: No    Physically Abused: No    Sexually Abused: No   Family History  Family history unknown: Yes      VITAL SIGNS BP 110/61   Pulse 67   Temp (!) 96.8 F (36 C)   Resp 18   Ht 4'  11" (1.499 m)   Wt 74 lb (33.6 kg)   SpO2 95%   BMI 14.95 kg/m   Outpatient Encounter Medications as of 10/10/2022  Medication Sig   acetaminophen (TYLENOL) 325 MG tablet Take 650 mg by mouth every 6 (six) hours as needed.   amLODipine (NORVASC) 5 MG tablet Take 5 mg by mouth daily.    ferrous sulfate 325 (65 FE) MG tablet Take 325 mg by mouth daily with breakfast.    melatonin 5 MG TABS Take 5 mg by mouth at bedtime.   metoprolol tartrate (LOPRESSOR) 25 MG tablet Take 25 mg by mouth 2 (two) times daily.   NON FORMULARY Diet change: Pureed diet   NON FORMULARY Wanderguard tag #[id number] to left ankle for safety awareness. Check placement and function qshift.   Nutritional Supplements (ENSURE ENLIVE PO) Take 120 mLs by mouth 2 (two) times daily between meals. With medpass- to support overall protein/energy intake.   sodium bicarbonate 650 MG tablet Take 650 mg by mouth 2 (two) times daily.   No facility-administered encounter medications on file as of 10/10/2022.     SIGNIFICANT DIAGNOSTIC EXAMS   LABS REVIEWED PREVIOUS;     02-03-22: wbc 8.0; hgb 9.0; hct 30.1; mcv 90.1 plt 253; glucose 87; bun 92; creat 2.87; k+ 5.0; na++ 140; ca 8.9; gfr 15 protein 6.8; albumin 3.6; tsh 1.272 06-04-22: wbc 5.2; hgb 9.2; hct 29.9; mcv 88.2 plt 188; glucose 100; bun 99; creat 2.95; k+ 4.1; na ++ 142; ca 8.5; gfr 14; d-dimer 0.83; crp 1.1   NO NEW LABS.    Review of Systems  Unable to perform ROS: Dementia   Physical Exam Constitutional:      General: Alicia Guerra is not in acute distress.    Appearance: Alicia Guerra is cachectic. Alicia Guerra is not diaphoretic.  Neck:     Thyroid: No thyromegaly.  Cardiovascular:     Rate and Rhythm: Normal rate and regular rhythm.     Heart sounds: Normal heart sounds.  Pulmonary:     Effort: Pulmonary effort is normal. No respiratory distress.     Breath sounds: Normal breath sounds.  Abdominal:     General: Bowel sounds are normal. There is no distension.     Palpations: Abdomen is  soft.     Tenderness: There is no abdominal tenderness.  Musculoskeletal:     Cervical back: Neck supple.     Right lower leg: No edema.     Left lower leg: No edema.     Comments: Able  to move all extremities   Lymphadenopathy:     Cervical: No cervical adenopathy.  Skin:    General: Skin is warm and dry.  Neurological:     Mental Status: Alicia Guerra is alert. Mental status is at baseline.  Psychiatric:        Mood and Affect: Mood normal.      ASSESSMENT/ PLAN:  TODAY:   CKD (chronic kidney disease) stage IV: bun 99; creat 2.95; gfr 14  2. Benign hypertension with CKD (Chronic kidney disease) stage IV: b/p 110/61 will lower norvasc 2.5 mg daily   3. History of colon cancer:   PREVIOUS   4. 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   5. Chronic idiopathic constipation: will monitor  6. Unspecified iron deficiency: hgb 9.0   7. Vitamin D deficiency: level is 51.94; is presently not on supplementation  8. Failure to thrive in adult: weight is 74 pounds; Alicia Guerra continues to slowly lose weight.   9. Aortic atherosclerosis (ct 05-24-22): not on statin due to advanced age.       Ok Edwards NP [facility]  call [phone]

## 2022-10-17 ENCOUNTER — Non-Acute Institutional Stay (SKILLED_NURSING_FACILITY): Payer: Medicare HMO | Admitting: Adult Health

## 2022-10-17 ENCOUNTER — Other Ambulatory Visit: Payer: Self-pay | Admitting: Adult Health

## 2022-10-17 DIAGNOSIS — F01C0 Vascular dementia, severe, without behavioral disturbance, psychotic disturbance, mood disturbance, and anxiety: Secondary | ICD-10-CM | POA: Diagnosis not present

## 2022-10-17 DIAGNOSIS — R627 Adult failure to thrive: Secondary | ICD-10-CM | POA: Diagnosis not present

## 2022-10-17 MED ORDER — MORPHINE SULFATE (CONCENTRATE) 20 MG/ML PO SOLN: 5.0000 mg | Freq: Four times a day (QID) | ORAL | 0 refills | Status: DC | PRN

## 2022-10-20 NOTE — Progress Notes (Signed)
Location:  [facility] Room Number: 109 Place of Service:  SNF (31)   CODE STATUS: dnr   No Known Allergies  Chief Complaint  Patient presents with   Acute Visit    Change in status     HPI:  She has a poor intake; is staying in bed; is having jerking movements which is causing her discomfort. She does have ativan ordered as needed. She would benefit from low dose morphine. Her medications have been stopped.   Past Medical History:  Diagnosis Date   Anemia    Arthritis    History of blood transfusion    "today is the first time" (08/05/2014)   Hypertension    Left femoral shaft fracture ([place]) 08/08/2014    Past Surgical History:  Procedure Laterality Date   APPENDECTOMY     CATARACT EXTRACTION Left    CHOLECYSTECTOMY     COLONOSCOPY N/A 09/26/2014   Procedure: COLONOSCOPY;  Surgeon: [name], MD;  Location: [facility];  Service: Endoscopy;  Laterality: N/A;  1030am   CYSTECTOMY     "had cyst taken off lower back"   ESOPHAGOGASTRODUODENOSCOPY N/A 08/22/2014   EHU:DJSHFWYO ring at the gastro junction/small HH   ORIF FEMUR FRACTURE Left 08/07/2014   Procedure: OPEN REDUCTION INTERNAL FIXATION (ORIF)  LEFT FEMUR FRACTURE;  Surgeon: [name], MD;  Location: [facility];  Service: Orthopedics;  Laterality: Left;   TONSILLECTOMY      Social History   Socioeconomic History   Marital status: Widowed    Spouse name: Not on file   Number of children: Not on file   Years of education: Not on file   Highest education level: Not on file  Occupational History   Occupation: retired   Tobacco Use   Smoking status: Former    Packs/day: 0.25    Years: 16.00    Total pack years: 4.00    Types: Cigarettes   Smokeless tobacco: Never  Vaping Use   Vaping Use: Never used  Substance and Sexual Activity   Alcohol use: No   Drug use: No   Sexual activity: Never  Other Topics Concern   Not on file  Social History Narrative   Long term  resident of [facility]    Social Determinants of Health   Financial Resource Strain: [place]  (06/12/2018)   Overall Financial Resource Strain (CARDIA)    Difficulty of Paying Living Expenses: Not hard at all  Food Insecurity: No Food Insecurity (06/12/2018)   Hunger Vital Sign    Worried About Running Out of Food in the Last Year: Never true    Ran Out of Food in the Last Year: Never true  Transportation Needs: No Transportation Needs (06/12/2018)   PRAPARE - Hydrologist (Medical): No    Lack of Transportation (Non-Medical): No  Physical Activity: Inactive (06/12/2018)   Exercise Vital Sign    Days of Exercise per Week: 0 days    Minutes of Exercise per Session: 0 min  Stress: No Stress Concern Present (06/12/2018)   [place]    Feeling of Stress : Not at all  Social Connections: Unknown (07/31/2019)   Social Connection and Isolation Panel [NHANES]    Frequency of Communication with Friends and Family: Not on file    Frequency of Social Gatherings with Friends and Family: Never    Attends Religious Services: Not on file  Active Member of Clubs or Organizations: Not on file    Attends Club or Organization Meetings: Not on file    Marital Status: Not on file  Intimate Partner Violence: Not At Risk (06/12/2018)   Humiliation, Afraid, Rape, and Kick questionnaire    Fear of Current or Ex-Partner: No    Emotionally Abused: No    Physically Abused: No    Sexually Abused: No   Family History  Family history unknown: Yes      VITAL SIGNS BP (!) 147/98   Pulse 99   Temp 98.1 F (36.7 C)   Ht '4\' 11"'$  (1.499 m)   Wt 69 lb (31.3 kg)   BMI 13.94 kg/m   Outpatient Encounter Medications as of 10/17/2022  Medication Sig   LORazepam (ATIVAN) 2 MG/ML concentrated solution Take 0.5 mg by mouth every 6 (six) hours as needed for anxiety.   morphine (ROXANOL) 20 MG/ML concentrated solution Take  0.25 mLs (5 mg total) by mouth every 6 (six) hours as needed for severe pain.   acetaminophen (TYLENOL) 325 MG tablet Take 650 mg by mouth every 6 (six) hours as needed.   NON FORMULARY Diet change: Pureed diet   NON FORMULARY Wanderguard tag #[id number] to left ankle for safety awareness. Check placement and function qshift.   Nutritional Supplements (ENSURE ENLIVE PO) Take 120 mLs by mouth 2 (two) times daily between meals. With medpass- to support overall protein/energy intake.   [DISCONTINUED] sodium bicarbonate 650 MG tablet Take 650 mg by mouth 2 (two) times daily.   No facility-administered encounter medications on file as of 10/17/2022.     SIGNIFICANT DIAGNOSTIC EXAMS   LABS REVIEWED PREVIOUS;     02-03-22: wbc 8.0; hgb 9.0; hct 30.1; mcv 90.1 plt 253; glucose 87; bun 92; creat 2.87; k+ 5.0; na++ 140; ca 8.9; gfr 15 protein 6.8; albumin 3.6; tsh 1.272 06-04-22: wbc 5.2; hgb 9.2; hct 29.9; mcv 88.2 plt 188; glucose 100; bun 99; creat 2.95; k+ 4.1; na ++ 142; ca 8.5; gfr 14; d-dimer 0.83; crp 1.1   NO NEW LABS.   Review of Systems  Unable to perform ROS: Dementia   Physical Exam Constitutional:      General: She is not in acute distress.    Appearance: She is cachectic. She is not diaphoretic.  Neck:     Thyroid: No thyromegaly.  Cardiovascular:     Rate and Rhythm: Normal rate and regular rhythm.     Heart sounds: Normal heart sounds.  Pulmonary:     Effort: Pulmonary effort is normal. No respiratory distress.     Breath sounds: Normal breath sounds.  Abdominal:     General: Bowel sounds are normal. There is no distension.     Palpations: Abdomen is soft.     Tenderness: There is no abdominal tenderness.  Musculoskeletal:     Cervical back: Neck supple.     Right lower leg: No edema.     Left lower leg: No edema.  Lymphadenopathy:     Cervical: No cervical adenopathy.  Skin:    General: Skin is warm and dry.  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 Failure to thrive   Will begin morphine liquid: 5 mg every 6 hours as needed her medications have been stopped.     Ok Edwards NP [facility]  call  [phone]   

## 2022-11-04 DEATH — deceased
